# Patient Record
Sex: Female | Born: 1965 | Race: Black or African American | Hispanic: No | Marital: Married | Smoking: Current every day smoker
Health system: Southern US, Community
[De-identification: ages and names within clinical notes are randomized; demographics above are authoritative.]

## PROBLEM LIST (undated history)

## (undated) DIAGNOSIS — Z720 Tobacco use: Secondary | ICD-10-CM

## (undated) DIAGNOSIS — K920 Hematemesis: Secondary | ICD-10-CM

## (undated) DIAGNOSIS — I509 Heart failure, unspecified: Secondary | ICD-10-CM

## (undated) DIAGNOSIS — I10 Essential (primary) hypertension: Secondary | ICD-10-CM

## (undated) DIAGNOSIS — F4310 Post-traumatic stress disorder, unspecified: Secondary | ICD-10-CM

## (undated) DIAGNOSIS — E782 Mixed hyperlipidemia: Secondary | ICD-10-CM

## (undated) DIAGNOSIS — G4730 Sleep apnea, unspecified: Secondary | ICD-10-CM

## (undated) DIAGNOSIS — J849 Interstitial pulmonary disease, unspecified: Secondary | ICD-10-CM

## (undated) DIAGNOSIS — B1920 Unspecified viral hepatitis C without hepatic coma: Secondary | ICD-10-CM

## (undated) DIAGNOSIS — J449 Chronic obstructive pulmonary disease, unspecified: Secondary | ICD-10-CM

## (undated) DIAGNOSIS — D699 Hemorrhagic condition, unspecified: Secondary | ICD-10-CM

## (undated) DIAGNOSIS — Z9981 Dependence on supplemental oxygen: Secondary | ICD-10-CM

## (undated) DIAGNOSIS — I8500 Esophageal varices without bleeding: Secondary | ICD-10-CM

## (undated) DIAGNOSIS — K7460 Unspecified cirrhosis of liver: Secondary | ICD-10-CM

## (undated) DIAGNOSIS — I5189 Other ill-defined heart diseases: Secondary | ICD-10-CM

## (undated) DIAGNOSIS — B1710 Acute hepatitis C without hepatic coma: Secondary | ICD-10-CM

## (undated) DIAGNOSIS — J45909 Unspecified asthma, uncomplicated: Secondary | ICD-10-CM

## (undated) DIAGNOSIS — K922 Gastrointestinal hemorrhage, unspecified: Secondary | ICD-10-CM

## (undated) DIAGNOSIS — E119 Type 2 diabetes mellitus without complications: Secondary | ICD-10-CM

## (undated) HISTORY — PX: CHOLECYSTECTOMY: SHX55

## (undated) HISTORY — DX: Heart failure, unspecified: I50.9

---

## 2005-03-11 ENCOUNTER — Emergency Department: Payer: Self-pay | Admitting: Unknown Physician Specialty

## 2005-06-14 ENCOUNTER — Ambulatory Visit: Payer: Self-pay | Admitting: Specialist

## 2005-06-26 ENCOUNTER — Ambulatory Visit: Payer: Self-pay | Admitting: Specialist

## 2007-04-22 ENCOUNTER — Emergency Department: Payer: Self-pay | Admitting: Emergency Medicine

## 2007-04-30 ENCOUNTER — Ambulatory Visit: Payer: Self-pay | Admitting: Oncology

## 2007-05-21 ENCOUNTER — Ambulatory Visit: Payer: Self-pay | Admitting: Oncology

## 2007-05-31 ENCOUNTER — Ambulatory Visit: Payer: Self-pay | Admitting: Oncology

## 2007-06-28 ENCOUNTER — Ambulatory Visit: Payer: Self-pay | Admitting: Oncology

## 2007-07-25 ENCOUNTER — Ambulatory Visit: Payer: Self-pay | Admitting: Family Medicine

## 2007-09-24 ENCOUNTER — Ambulatory Visit: Payer: Self-pay | Admitting: Pain Medicine

## 2007-10-05 ENCOUNTER — Ambulatory Visit: Payer: Self-pay | Admitting: Pain Medicine

## 2007-11-05 ENCOUNTER — Ambulatory Visit: Payer: Self-pay | Admitting: Pain Medicine

## 2007-11-11 ENCOUNTER — Ambulatory Visit: Payer: Self-pay | Admitting: Pain Medicine

## 2007-11-30 ENCOUNTER — Ambulatory Visit: Payer: Self-pay | Admitting: Pain Medicine

## 2007-12-09 ENCOUNTER — Ambulatory Visit: Payer: Self-pay | Admitting: Pain Medicine

## 2008-01-07 ENCOUNTER — Ambulatory Visit: Payer: Self-pay | Admitting: Pain Medicine

## 2008-02-01 ENCOUNTER — Ambulatory Visit: Payer: Self-pay | Admitting: Pain Medicine

## 2008-02-18 ENCOUNTER — Ambulatory Visit: Payer: Self-pay

## 2008-04-13 ENCOUNTER — Inpatient Hospital Stay: Payer: Self-pay | Admitting: Internal Medicine

## 2008-06-07 ENCOUNTER — Ambulatory Visit: Payer: Self-pay | Admitting: Pain Medicine

## 2008-06-20 ENCOUNTER — Ambulatory Visit: Payer: Self-pay | Admitting: Pain Medicine

## 2009-08-20 ENCOUNTER — Emergency Department: Payer: Self-pay | Admitting: Emergency Medicine

## 2009-09-05 ENCOUNTER — Emergency Department: Payer: Self-pay | Admitting: Internal Medicine

## 2009-10-14 ENCOUNTER — Inpatient Hospital Stay: Payer: Self-pay | Admitting: Internal Medicine

## 2009-11-17 ENCOUNTER — Ambulatory Visit: Payer: Self-pay | Admitting: Otolaryngology

## 2009-11-29 ENCOUNTER — Ambulatory Visit: Payer: Self-pay | Admitting: Internal Medicine

## 2009-12-06 ENCOUNTER — Encounter: Payer: Self-pay | Admitting: Otolaryngology

## 2009-12-28 ENCOUNTER — Encounter: Payer: Self-pay | Admitting: Otolaryngology

## 2010-01-27 ENCOUNTER — Encounter: Payer: Self-pay | Admitting: Otolaryngology

## 2011-03-04 ENCOUNTER — Emergency Department: Payer: Self-pay | Admitting: Emergency Medicine

## 2011-06-07 ENCOUNTER — Ambulatory Visit: Payer: Self-pay | Admitting: Primary Care

## 2011-07-29 ENCOUNTER — Ambulatory Visit: Payer: Self-pay | Admitting: Family Medicine

## 2011-08-16 ENCOUNTER — Emergency Department: Payer: Self-pay | Admitting: *Deleted

## 2011-08-16 LAB — COMPREHENSIVE METABOLIC PANEL
Alkaline Phosphatase: 141 U/L — ABNORMAL HIGH (ref 50–136)
Anion Gap: 8 (ref 7–16)
Calcium, Total: 8.3 mg/dL — ABNORMAL LOW (ref 8.5–10.1)
Co2: 26 mmol/L (ref 21–32)
Creatinine: 0.94 mg/dL (ref 0.60–1.30)
Potassium: 3.7 mmol/L (ref 3.5–5.1)
SGPT (ALT): 154 U/L — ABNORMAL HIGH
Sodium: 135 mmol/L — ABNORMAL LOW (ref 136–145)

## 2011-08-16 LAB — URINALYSIS, COMPLETE
Glucose,UR: NEGATIVE mg/dL (ref 0–75)
Nitrite: NEGATIVE
Ph: 5 (ref 4.5–8.0)
RBC,UR: 2 /HPF (ref 0–5)
Specific Gravity: 1.005 (ref 1.003–1.030)
Squamous Epithelial: 8
WBC UR: 1 /HPF (ref 0–5)

## 2011-08-16 LAB — LIPASE, BLOOD: Lipase: 223 U/L (ref 73–393)

## 2011-08-16 LAB — CBC
HCT: 46 % (ref 35.0–47.0)
RBC: 4.45 10*6/uL (ref 3.80–5.20)
WBC: 4.9 10*3/uL (ref 3.6–11.0)

## 2013-01-31 LAB — URINALYSIS, COMPLETE
Bilirubin,UR: NEGATIVE
Glucose,UR: NEGATIVE mg/dL (ref 0–75)
Hyaline Cast: 2
Ketone: NEGATIVE
Nitrite: POSITIVE
Protein: NEGATIVE
Squamous Epithelial: 2
WBC UR: 10 /HPF (ref 0–5)

## 2013-01-31 LAB — COMPREHENSIVE METABOLIC PANEL
Albumin: 2.5 g/dL — ABNORMAL LOW (ref 3.4–5.0)
Anion Gap: 7 (ref 7–16)
Bilirubin,Total: 0.9 mg/dL (ref 0.2–1.0)
Creatinine: 0.54 mg/dL — ABNORMAL LOW (ref 0.60–1.30)
EGFR (African American): >60
Glucose: 124 mg/dL — ABNORMAL HIGH (ref 65–99)
Osmolality: 268 (ref 275–301)
Potassium: 4.2 mmol/L (ref 3.5–5.1)
SGOT(AST): 126 U/L — ABNORMAL HIGH (ref 15–37)
SGPT (ALT): 124 U/L — ABNORMAL HIGH (ref 12–78)

## 2013-01-31 LAB — CBC
MCH: 35.5 pg — ABNORMAL HIGH (ref 26.0–34.0)
MCHC: 34.4 g/dL (ref 32.0–36.0)
MCV: 103 fL — ABNORMAL HIGH (ref 80–100)

## 2013-01-31 LAB — LIPASE, BLOOD: Lipase: 326 U/L (ref 73–393)

## 2013-02-01 ENCOUNTER — Observation Stay: Payer: Self-pay | Admitting: Surgery

## 2013-02-01 LAB — CBC WITH DIFFERENTIAL/PLATELET
Basophil #: 0 10*3/uL (ref 0.0–0.1)
Basophil %: 0.4 %
Eosinophil #: 0 10*3/uL (ref 0.0–0.7)
Eosinophil %: 0.1 %
HGB: 14.6 g/dL (ref 12.0–16.0)
Lymphocyte #: 2.3 10*3/uL (ref 1.0–3.6)
Lymphocyte %: 19.8 %
MCH: 35.3 pg — ABNORMAL HIGH (ref 26.0–34.0)
MCV: 103 fL — ABNORMAL HIGH (ref 80–100)
Monocyte #: 0.7 x10 3/mm (ref 0.2–0.9)
Monocyte %: 5.8 %
Neutrophil %: 73.9 %
Platelet: 42 10*3/uL — ABNORMAL LOW (ref 150–440)
RBC: 4.14 10*6/uL (ref 3.80–5.20)
RDW: 13.8 % (ref 11.5–14.5)
WBC: 11.6 10*3/uL — ABNORMAL HIGH (ref 3.6–11.0)

## 2013-02-01 LAB — PROTIME-INR
INR: 1.4
Prothrombin Time: 16.8 secs — ABNORMAL HIGH (ref 11.5–14.7)

## 2013-02-01 LAB — BASIC METABOLIC PANEL
Anion Gap: 5 — ABNORMAL LOW (ref 7–16)
BUN: 9 mg/dL (ref 7–18)
Creatinine: 1.01 mg/dL (ref 0.60–1.30)
EGFR (African American): >60
EGFR (Non-African Amer.): >60
Potassium: 3.8 mmol/L (ref 3.5–5.1)
Sodium: 135 mmol/L — ABNORMAL LOW (ref 136–145)

## 2013-02-01 LAB — HEPATIC FUNCTION PANEL A (ARMC)
Bilirubin, Direct: 0.4 mg/dL — ABNORMAL HIGH (ref 0.00–0.20)
Bilirubin,Total: 1.3 mg/dL — ABNORMAL HIGH (ref 0.2–1.0)
SGOT(AST): 84 U/L — ABNORMAL HIGH (ref 15–37)
SGPT (ALT): 101 U/L — ABNORMAL HIGH (ref 12–78)
Total Protein: 7.7 g/dL (ref 6.4–8.2)

## 2013-02-02 LAB — COMPREHENSIVE METABOLIC PANEL
Alkaline Phosphatase: 86 U/L (ref 50–136)
Anion Gap: 4 — ABNORMAL LOW (ref 7–16)
BUN: 7 mg/dL (ref 7–18)
Bilirubin,Total: 1.6 mg/dL — ABNORMAL HIGH (ref 0.2–1.0)
Chloride: 102 mmol/L (ref 98–107)
Co2: 27 mmol/L (ref 21–32)
Creatinine: 1.03 mg/dL (ref 0.60–1.30)
EGFR (Non-African Amer.): >60
Glucose: 130 mg/dL — ABNORMAL HIGH (ref 65–99)
Osmolality: 266 (ref 275–301)
SGOT(AST): 55 U/L — ABNORMAL HIGH (ref 15–37)
Sodium: 133 mmol/L — ABNORMAL LOW (ref 136–145)
Total Protein: 7.9 g/dL (ref 6.4–8.2)

## 2013-02-02 LAB — CBC WITH DIFFERENTIAL/PLATELET
Eosinophil %: 0.1 %
HCT: 39.4 % (ref 35.0–47.0)
Lymphocyte %: 28.5 %
MCH: 35.6 pg — ABNORMAL HIGH (ref 26.0–34.0)
MCV: 102 fL — ABNORMAL HIGH (ref 80–100)
Neutrophil #: 6.8 10*3/uL — ABNORMAL HIGH (ref 1.4–6.5)
Neutrophil %: 59.5 %
Platelet: 35 10*3/uL — ABNORMAL LOW (ref 150–440)
RBC: 3.85 10*6/uL (ref 3.80–5.20)
RDW: 13.8 % (ref 11.5–14.5)
WBC: 11.4 10*3/uL — ABNORMAL HIGH (ref 3.6–11.0)

## 2013-04-14 DIAGNOSIS — K8020 Calculus of gallbladder without cholecystitis without obstruction: Secondary | ICD-10-CM | POA: Insufficient documentation

## 2013-04-14 DIAGNOSIS — K859 Acute pancreatitis without necrosis or infection, unspecified: Secondary | ICD-10-CM | POA: Insufficient documentation

## 2013-07-12 ENCOUNTER — Ambulatory Visit: Payer: Self-pay | Admitting: Pain Medicine

## 2013-11-13 ENCOUNTER — Emergency Department: Payer: Self-pay | Admitting: Emergency Medicine

## 2013-11-13 LAB — CBC WITH DIFFERENTIAL/PLATELET
BASOS ABS: 0.2 10*3/uL — AB (ref 0.0–0.1)
BASOS PCT: 3.3 %
EOS ABS: 0.1 10*3/uL (ref 0.0–0.7)
Eosinophil %: 1.2 %
HCT: 47.5 % — ABNORMAL HIGH (ref 35.0–47.0)
HGB: 15.3 g/dL (ref 12.0–16.0)
Lymphocyte #: 2.4 10*3/uL (ref 1.0–3.6)
Lymphocyte %: 37 %
MCH: 33.7 pg (ref 26.0–34.0)
MCHC: 32.2 g/dL (ref 32.0–36.0)
MCV: 105 fL — AB (ref 80–100)
MONO ABS: 0.5 x10 3/mm (ref 0.2–0.9)
MONOS PCT: 7.3 %
Neutrophil #: 3.4 10*3/uL (ref 1.4–6.5)
Neutrophil %: 51.2 %
PLATELETS: 68 10*3/uL — AB (ref 150–440)
RBC: 4.53 10*6/uL (ref 3.80–5.20)
RDW: 13.6 % (ref 11.5–14.5)
WBC: 6.6 10*3/uL (ref 3.6–11.0)

## 2013-11-13 LAB — COMPREHENSIVE METABOLIC PANEL
ALK PHOS: 172 U/L — AB
ANION GAP: 5 — AB (ref 7–16)
Albumin: 2.6 g/dL — ABNORMAL LOW (ref 3.4–5.0)
BILIRUBIN TOTAL: 0.8 mg/dL (ref 0.2–1.0)
BUN: 13 mg/dL (ref 7–18)
CO2: 31 mmol/L (ref 21–32)
Calcium, Total: 8.6 mg/dL (ref 8.5–10.1)
Chloride: 97 mmol/L — ABNORMAL LOW (ref 98–107)
Creatinine: 1.16 mg/dL (ref 0.60–1.30)
EGFR (African American): >60
GFR CALC NON AF AMER: 56 — AB
Glucose: 324 mg/dL — ABNORMAL HIGH (ref 65–99)
Osmolality: 279 (ref 275–301)
Potassium: 4.6 mmol/L (ref 3.5–5.1)
SGOT(AST): 92 U/L — ABNORMAL HIGH (ref 15–37)
SGPT (ALT): 120 U/L — ABNORMAL HIGH (ref 12–78)
Sodium: 133 mmol/L — ABNORMAL LOW (ref 136–145)
Total Protein: 9.3 g/dL — ABNORMAL HIGH (ref 6.4–8.2)

## 2013-11-13 LAB — PRO B NATRIURETIC PEPTIDE: B-Type Natriuretic Peptide: 30 pg/mL (ref 0–125)

## 2014-01-28 ENCOUNTER — Inpatient Hospital Stay: Payer: Self-pay | Admitting: Internal Medicine

## 2014-01-28 LAB — BASIC METABOLIC PANEL
Anion Gap: 6 — ABNORMAL LOW (ref 7–16)
BUN: 9 mg/dL (ref 7–18)
CALCIUM: 8.4 mg/dL — AB (ref 8.5–10.1)
Chloride: 98 mmol/L (ref 98–107)
Co2: 30 mmol/L (ref 21–32)
Creatinine: 0.81 mg/dL (ref 0.60–1.30)
Glucose: 262 mg/dL — ABNORMAL HIGH (ref 65–99)
Osmolality: 276 (ref 275–301)
POTASSIUM: 4.4 mmol/L (ref 3.5–5.1)
Sodium: 134 mmol/L — ABNORMAL LOW (ref 136–145)

## 2014-01-28 LAB — CBC
HCT: 47.7 % — AB (ref 35.0–47.0)
HGB: 15.5 g/dL (ref 12.0–16.0)
MCH: 34.2 pg — ABNORMAL HIGH (ref 26.0–34.0)
MCHC: 32.5 g/dL (ref 32.0–36.0)
MCV: 105 fL — AB (ref 80–100)
Platelet: 64 10*3/uL — ABNORMAL LOW (ref 150–440)
RBC: 4.54 10*6/uL (ref 3.80–5.20)
RDW: 14.3 % (ref 11.5–14.5)
WBC: 5.3 10*3/uL (ref 3.6–11.0)

## 2014-01-28 LAB — TROPONIN I: Troponin-I: <0.02 ng/mL

## 2014-01-28 LAB — PRO B NATRIURETIC PEPTIDE: B-Type Natriuretic Peptide: 44 pg/mL (ref 0–125)

## 2014-01-29 LAB — COMPREHENSIVE METABOLIC PANEL
ALK PHOS: 192 U/L — AB
ALT: 72 U/L — AB
ANION GAP: 5 — AB (ref 7–16)
AST: 41 U/L — AB (ref 15–37)
Albumin: 2.5 g/dL — ABNORMAL LOW (ref 3.4–5.0)
BUN: 14 mg/dL (ref 7–18)
Bilirubin,Total: 0.8 mg/dL (ref 0.2–1.0)
CALCIUM: 8.5 mg/dL (ref 8.5–10.1)
CREATININE: 0.82 mg/dL (ref 0.60–1.30)
Chloride: 98 mmol/L (ref 98–107)
Co2: 29 mmol/L (ref 21–32)
Glucose: 405 mg/dL — ABNORMAL HIGH (ref 65–99)
Osmolality: 282 (ref 275–301)
Potassium: 4.1 mmol/L (ref 3.5–5.1)
Sodium: 132 mmol/L — ABNORMAL LOW (ref 136–145)
Total Protein: 8.9 g/dL — ABNORMAL HIGH (ref 6.4–8.2)

## 2014-01-29 LAB — CBC WITH DIFFERENTIAL/PLATELET
Basophil #: 0 10*3/uL (ref 0.0–0.1)
Basophil %: 0.4 %
EOS ABS: 0 10*3/uL (ref 0.0–0.7)
EOS PCT: 0 %
HCT: 45.9 % (ref 35.0–47.0)
HGB: 14.7 g/dL (ref 12.0–16.0)
Lymphocyte #: 0.7 10*3/uL — ABNORMAL LOW (ref 1.0–3.6)
Lymphocyte %: 9 %
MCH: 33.6 pg (ref 26.0–34.0)
MCHC: 32.1 g/dL (ref 32.0–36.0)
MCV: 105 fL — ABNORMAL HIGH (ref 80–100)
Monocyte #: 0.2 x10 3/mm (ref 0.2–0.9)
Monocyte %: 2.3 %
Neutrophil #: 7.1 10*3/uL — ABNORMAL HIGH (ref 1.4–6.5)
Neutrophil %: 88.3 %
Platelet: 62 10*3/uL — ABNORMAL LOW (ref 150–440)
RBC: 4.38 10*6/uL (ref 3.80–5.20)
RDW: 14.2 % (ref 11.5–14.5)
WBC: 8.1 10*3/uL (ref 3.6–11.0)

## 2014-01-29 LAB — TSH: Thyroid Stimulating Horm: 0.29 u[IU]/mL — ABNORMAL LOW

## 2014-04-08 ENCOUNTER — Inpatient Hospital Stay: Payer: Self-pay | Admitting: Internal Medicine

## 2014-04-08 LAB — BASIC METABOLIC PANEL
Anion Gap: 5 — ABNORMAL LOW (ref 7–16)
BUN: 8 mg/dL (ref 7–18)
Calcium, Total: 7.9 mg/dL — ABNORMAL LOW (ref 8.5–10.1)
Chloride: 98 mmol/L (ref 98–107)
Co2: 30 mmol/L (ref 21–32)
Creatinine: 0.74 mg/dL (ref 0.60–1.30)
EGFR (Non-African Amer.): >60
Glucose: 219 mg/dL — ABNORMAL HIGH (ref 65–99)
OSMOLALITY: 271 (ref 275–301)
Potassium: 4 mmol/L (ref 3.5–5.1)
SODIUM: 133 mmol/L — AB (ref 136–145)

## 2014-04-08 LAB — PRO B NATRIURETIC PEPTIDE: B-Type Natriuretic Peptide: 55 pg/mL (ref 0–125)

## 2014-04-08 LAB — CBC
HCT: 47.7 % — AB (ref 35.0–47.0)
HGB: 15.5 g/dL (ref 12.0–16.0)
MCH: 33.8 pg (ref 26.0–34.0)
MCHC: 32.4 g/dL (ref 32.0–36.0)
MCV: 104 fL — ABNORMAL HIGH (ref 80–100)
Platelet: 88 10*3/uL — ABNORMAL LOW (ref 150–440)
RBC: 4.57 10*6/uL (ref 3.80–5.20)
RDW: 13.5 % (ref 11.5–14.5)
WBC: 11 10*3/uL (ref 3.6–11.0)

## 2014-04-08 LAB — TROPONIN I

## 2014-04-09 LAB — CBC WITH DIFFERENTIAL/PLATELET
BASOS ABS: 0 10*3/uL (ref 0.0–0.1)
Basophil %: 0.2 %
Eosinophil #: 0 10*3/uL (ref 0.0–0.7)
Eosinophil %: 0 %
HCT: 47.7 % — ABNORMAL HIGH (ref 35.0–47.0)
HGB: 15.3 g/dL (ref 12.0–16.0)
Lymphocyte #: 0.8 10*3/uL — ABNORMAL LOW (ref 1.0–3.6)
Lymphocyte %: 10.8 %
MCH: 34.3 pg — ABNORMAL HIGH (ref 26.0–34.0)
MCHC: 32.1 g/dL (ref 32.0–36.0)
MCV: 107 fL — ABNORMAL HIGH (ref 80–100)
Monocyte #: 0.5 x10 3/mm (ref 0.2–0.9)
Monocyte %: 7.2 %
Neutrophil #: 6.2 10*3/uL (ref 1.4–6.5)
Neutrophil %: 81.8 %
Platelet: 80 10*3/uL — ABNORMAL LOW (ref 150–440)
RBC: 4.47 10*6/uL (ref 3.80–5.20)
RDW: 13.7 % (ref 11.5–14.5)
WBC: 7.6 10*3/uL (ref 3.6–11.0)

## 2014-04-09 LAB — BASIC METABOLIC PANEL
Anion Gap: 6 — ABNORMAL LOW (ref 7–16)
BUN: 12 mg/dL (ref 7–18)
CO2: 26 mmol/L (ref 21–32)
Calcium, Total: 7.8 mg/dL — ABNORMAL LOW (ref 8.5–10.1)
Chloride: 100 mmol/L (ref 98–107)
Creatinine: 0.94 mg/dL (ref 0.60–1.30)
EGFR (African American): >60
EGFR (Non-African Amer.): >60
Glucose: 485 mg/dL — ABNORMAL HIGH (ref 65–99)
Osmolality: 286 (ref 275–301)
Potassium: 4.7 mmol/L (ref 3.5–5.1)
Sodium: 132 mmol/L — ABNORMAL LOW (ref 136–145)

## 2014-04-09 LAB — HEMOGLOBIN A1C: HEMOGLOBIN A1C: 7.1 % — AB (ref 4.2–6.3)

## 2014-04-12 LAB — CBC WITH DIFFERENTIAL/PLATELET
Basophil #: 0 10*3/uL (ref 0.0–0.1)
Basophil %: 0.3 %
Eosinophil #: 0 10*3/uL (ref 0.0–0.7)
Eosinophil %: 0.3 %
HCT: 45.6 % (ref 35.0–47.0)
HGB: 14.6 g/dL (ref 12.0–16.0)
Lymphocyte #: 2.7 10*3/uL (ref 1.0–3.6)
Lymphocyte %: 28.4 %
MCH: 34.3 pg — ABNORMAL HIGH (ref 26.0–34.0)
MCHC: 32 g/dL (ref 32.0–36.0)
MCV: 107 fL — ABNORMAL HIGH (ref 80–100)
Monocyte #: 1.1 x10 3/mm — ABNORMAL HIGH (ref 0.2–0.9)
Monocyte %: 11.2 %
Neutrophil #: 5.7 10*3/uL (ref 1.4–6.5)
Neutrophil %: 59.8 %
Platelet: 89 10*3/uL — ABNORMAL LOW (ref 150–440)
RBC: 4.24 10*6/uL (ref 3.80–5.20)
RDW: 13.4 % (ref 11.5–14.5)
WBC: 9.6 10*3/uL (ref 3.6–11.0)

## 2014-04-12 LAB — BASIC METABOLIC PANEL
ANION GAP: 7 (ref 7–16)
BUN: 16 mg/dL (ref 7–18)
CO2: 29 mmol/L (ref 21–32)
Calcium, Total: 8.3 mg/dL — ABNORMAL LOW (ref 8.5–10.1)
Chloride: 99 mmol/L (ref 98–107)
Creatinine: 0.94 mg/dL (ref 0.60–1.30)
EGFR (African American): >60
EGFR (Non-African Amer.): >60
Glucose: 486 mg/dL — ABNORMAL HIGH (ref 65–99)
Osmolality: 293 (ref 275–301)
Potassium: 4.1 mmol/L (ref 3.5–5.1)
Sodium: 135 mmol/L — ABNORMAL LOW (ref 136–145)

## 2014-04-12 LAB — EXPECTORATED SPUTUM ASSESSMENT W GRAM STAIN, RFLX TO RESP C

## 2014-04-13 LAB — PROTIME-INR
INR: 1.1
Prothrombin Time: 14.1 secs (ref 11.5–14.7)

## 2014-04-13 LAB — BASIC METABOLIC PANEL
ANION GAP: 7 (ref 7–16)
BUN: 14 mg/dL (ref 7–18)
CREATININE: 0.78 mg/dL (ref 0.60–1.30)
Calcium, Total: 8 mg/dL — ABNORMAL LOW (ref 8.5–10.1)
Chloride: 98 mmol/L (ref 98–107)
Co2: 29 mmol/L (ref 21–32)
EGFR (African American): >60
GLUCOSE: 365 mg/dL — AB (ref 65–99)
Osmolality: 284 (ref 275–301)
POTASSIUM: 3.9 mmol/L (ref 3.5–5.1)
Sodium: 134 mmol/L — ABNORMAL LOW (ref 136–145)

## 2014-04-13 LAB — CBC WITH DIFFERENTIAL/PLATELET
BASOS ABS: 0.1 10*3/uL (ref 0.0–0.1)
BASOS PCT: 0.8 %
Eosinophil #: 0.1 10*3/uL (ref 0.0–0.7)
Eosinophil %: 0.9 %
HCT: 44.9 % (ref 35.0–47.0)
HGB: 14.6 g/dL (ref 12.0–16.0)
Lymphocyte #: 2.9 10*3/uL (ref 1.0–3.6)
Lymphocyte %: 29.4 %
MCH: 34.3 pg — ABNORMAL HIGH (ref 26.0–34.0)
MCHC: 32.6 g/dL (ref 32.0–36.0)
MCV: 105 fL — AB (ref 80–100)
MONO ABS: 1 x10 3/mm — AB (ref 0.2–0.9)
MONOS PCT: 10.5 %
NEUTROS PCT: 58.4 %
Neutrophil #: 5.8 10*3/uL (ref 1.4–6.5)
Platelet: 87 10*3/uL — ABNORMAL LOW (ref 150–440)
RBC: 4.26 10*6/uL (ref 3.80–5.20)
RDW: 13.1 % (ref 11.5–14.5)
WBC: 9.9 10*3/uL (ref 3.6–11.0)

## 2014-04-13 LAB — BODY FLUID CELL COUNT WITH DIFFERENTIAL
Basophil: 0 %
EOS PCT: 0 %
Lymphocytes: 54 %
NUCLEATED CELL COUNT: 1805 /mm3
Neutrophils: 42 %
OTHER CELLS BF: 0 %
OTHER MONONUCLEAR CELLS: 4 %

## 2014-04-13 LAB — CULTURE, BLOOD (SINGLE)

## 2014-04-13 LAB — PROTEIN, BODY FLUID: PROTEIN, BODY FLUID: 4.1 g/dL

## 2014-04-13 LAB — APTT: Activated PTT: 30.2 secs (ref 23.6–35.9)

## 2014-04-13 LAB — LACTATE DEHYDROGENASE, PLEURAL OR PERITONEAL FLUID: LDH, Body Fluid: 400 U/L

## 2014-04-13 LAB — ALBUMIN, FLUID (OTHER): Body Fluid Albumin: 1.3 g/dL

## 2014-04-13 LAB — GLUCOSE, SEROUS FLUID: Glucose, Body Fluid: 414 mg/dL

## 2014-04-14 LAB — BASIC METABOLIC PANEL
Anion Gap: 6 — ABNORMAL LOW (ref 7–16)
BUN: 13 mg/dL (ref 7–18)
CALCIUM: 8 mg/dL — AB (ref 8.5–10.1)
CREATININE: 0.72 mg/dL (ref 0.60–1.30)
Chloride: 98 mmol/L (ref 98–107)
Co2: 29 mmol/L (ref 21–32)
EGFR (Non-African Amer.): >60
Glucose: 347 mg/dL — ABNORMAL HIGH (ref 65–99)
OSMOLALITY: 280 (ref 275–301)
Potassium: 3.7 mmol/L (ref 3.5–5.1)
SODIUM: 133 mmol/L — AB (ref 136–145)

## 2014-04-14 LAB — CBC WITH DIFFERENTIAL/PLATELET
Basophil #: 0 10*3/uL (ref 0.0–0.1)
Basophil %: 0.4 %
EOS ABS: 0.1 10*3/uL (ref 0.0–0.7)
Eosinophil %: 1.2 %
HCT: 45.1 % (ref 35.0–47.0)
HGB: 14.5 g/dL (ref 12.0–16.0)
Lymphocyte #: 2.6 10*3/uL (ref 1.0–3.6)
Lymphocyte %: 26.1 %
MCH: 34.3 pg — ABNORMAL HIGH (ref 26.0–34.0)
MCHC: 32.2 g/dL (ref 32.0–36.0)
MCV: 106 fL — AB (ref 80–100)
Monocyte #: 0.9 x10 3/mm (ref 0.2–0.9)
Monocyte %: 9.1 %
Neutrophil #: 6.4 10*3/uL (ref 1.4–6.5)
Neutrophil %: 63.2 %
PLATELETS: 92 10*3/uL — AB (ref 150–440)
RBC: 4.24 10*6/uL (ref 3.80–5.20)
RDW: 13.1 % (ref 11.5–14.5)
WBC: 10.1 10*3/uL (ref 3.6–11.0)

## 2014-04-17 LAB — BODY FLUID CULTURE

## 2014-05-06 ENCOUNTER — Ambulatory Visit: Payer: Self-pay | Admitting: Internal Medicine

## 2014-06-01 ENCOUNTER — Ambulatory Visit: Payer: Self-pay | Admitting: Internal Medicine

## 2014-08-19 NOTE — Consult Note (Signed)
Chief Complaint:  Subjective/Chief Complaint Pt reports continued mild upper abdominal pain.  Denies nausea or vomiting.   VITAL SIGNS/ANCILLARY NOTES: **Vital Signs.:   07-Oct-14 09:57  Vital Signs Type Q 4hr  Temperature Temperature (F) 99.9  Celsius 37.7  Temperature Source oral  Pulse Pulse 116  Respirations Respirations 18  Systolic BP Systolic BP 588  Diastolic BP (mmHg) Diastolic BP (mmHg) 69  Mean BP 81  Pulse Ox % Pulse Ox % 92  Pulse Ox Activity Level  At rest  Oxygen Delivery 2L   Brief Assessment:  GEN well developed, well nourished, A/Ox3.   Cardiac Regular   Respiratory normal resp effort   Gastrointestinal Normal   Gastrointestinal details normal Soft  Nondistended   EXTR negative edema   Additional Physical Exam Skin: warm ,dry   Lab Results:  Hepatic:  07-Oct-14 13:11   Bilirubin, Total  1.6  Alkaline Phosphatase 86  SGPT (ALT) 73  SGOT (AST)  55  Total Protein, Serum 7.9  Albumin, Serum  2.1  Routine Chem:  07-Oct-14 13:11   Glucose, Serum  130  Creatinine (comp) 1.03  Sodium, Serum  133  Potassium, Serum  3.1  Chloride, Serum 102  CO2, Serum 27  Calcium (Total), Serum  7.5  Osmolality (calc) 266  eGFR (African American) >60  eGFR (Non-African American) >60 (eGFR values <47m/min/1.73 m2 may be an indication of chronic kidney disease (CKD). Calculated eGFR is useful in patients with stable renal function. The eGFR calculation will not be reliable in acutely ill patients when serum creatinine is changing rapidly. It is not useful in  patients on dialysis. The eGFR calculation may not be applicable to patients at the low and high extremes of body sizes, pregnant women, and vegetarians.)  Anion Gap  4  Routine Hem:  07-Oct-14 13:11   WBC (CBC)  11.4  RBC (CBC) 3.85  Hemoglobin (CBC) 13.7  Hematocrit (CBC) 39.4  Platelet Count (CBC)  35  MCV  102  MCH  35.6  MCHC 34.8  RDW 13.8  Neutrophil % 59.5  Lymphocyte % 28.5  Monocyte  % 11.2  Eosinophil % 0.1  Basophil % 0.7  Neutrophil #  6.8  Lymphocyte # 3.3  Monocyte #  1.3  Eosinophil # 0.0  Basophil # 0.1 (Result(s) reported on 02 Feb 2013 at 01:27PM.)   Assessment/Plan:  Assessment/Plan:  Assessment ETOH/HCV cirrhosis:  Hx nonwith followup & daily ETOH.  MELD 11. Acute calculus cholecystitis:  WBC 11.6.  Pt high-risk surgical candidate given thrombocytopenia.   Plan 1) Continue IV Zosyn 2) Agree w/ suppportive measures including antiemetics, fluids, PPI & pain control 3) ETOH cessation  4) Outpatient EGD to look for varices & screening colonscopy at a later date Pt was seen by Dr DLucilla Lame& our plan of care is outlined above. Please call with any questions or concerns.   Electronic Signatures: JAndria Meuse(NP)  (Signed 07-Oct-14 14:00)  Authored: Chief Complaint, VITAL SIGNS/ANCILLARY NOTES, Brief Assessment, Lab Results, Assessment/Plan   Last Updated: 07-Oct-14 14:00 by JAndria Meuse(NP)

## 2014-08-19 NOTE — Consult Note (Signed)
Brief Consult Note: Diagnosis: Abd pain/cirrhosis.   Patient was seen by consultant.   Consult note dictated.   Discussed with Attending MD.   Comments: [name] is a [age] y/o black female with ETOH/HCV cirrhosis [facility] who was not candidate for treatment admitted with less than 24 hrs of abdominal pain, upper that radiates to entire abdomen.  MELD 11.  Ultrasound suggests cholelithiasis/sludge & percholecystic fluid with a normal CBD.  These findings can be non-specific in cirrhotics, so her symptoms could be due to acute cholecystitis or other acute viral gastroenteritis or illness.  Unfortunately due to her liver disease & thrombocytopenia (platelets 42), she is a high risk surgical candidate.  Plan: 1) Agree with IV Zosyn 2) Agree w/ suppportive measures including antiemetics, fluids, PPI & pain control 3) ETOH cessation discussion 4) Needs outpatient EGD to look for varices & screening colonscopy at a later date 5) We will follow with you  Thanks for consult.  Please see full dictated note [phone]#[id number].  Electronic Signatures: Joselyn ArrowJones, Sencere Symonette L (NP)  (Signed 06-Oct-14 14:39)  Authored: Brief Consult Note   Last Updated: 06-Oct-14 14:39 by Joselyn ArrowJones, Soha Thorup L (NP)

## 2014-08-19 NOTE — Discharge Summary (Signed)
PATIENT NAME:  [name], [name] MR#:  [id number] DATE OF BIRTH:  [birth year]  BRIEF HISTORY: [name] is [name] 49 year old woman seen in the Emergency Room with generalized abdominal pain and right upper quadrant and epigastric area with no nausea or vomiting. Laboratory values revealed normal white blood cell count, normal hemoglobin, normal liver function studies with the exception of slightly elevated transaminases. Ultrasound demonstrated some possible pericholecystic fluid, some possible free abdominal fluid, tiny gallstones, and possible abdominal wall thickening. The study was read as possible acute cholecystitis. However, the patient has [name] long-standing history of alcoholic cirrhosis and hepatitis C, has [facility].   We initially felt that her symptoms were most likely due to her long-standing cirrhosis then to biliary tract disease. She had [name] platelet count 67,000. She was seen by the GI service and internal medicine service in consultation. Both did not feel that the patient had significant biliary tract disease and her symptoms were managed with pain medication.   She is discharged home on the 8th to be [place] up [place] [facility].   DISCHARGE MEDICATIONS: Include metformin 500 mg b.i.d., Advair Diskus 250/50 one  puff b.i.d., albuterol inhaler 1 puff 4 times daily and Keflex 500 mg p.o. 4 times daily.   FINAL DISCHARGE DIAGNOSIS: Liver cirrhosis. No surgery was performed.   ____________________________ Carmie Endalph L. Ely III, MD rle:np D: 02/23/2013 20:36:23 ET T: 02/23/2013 20:53:21 ET JOB#: [id number]  cc: Carmie Endalph L. Ely III, MD, <Dictator> Midge Miniumarren Wohl, MD Juluis RainierElizabeth Barnes, MD Quentin OreALPH L ELY MD ELECTRONICALLY SIGNED 02/24/2013 21:31

## 2014-08-19 NOTE — Consult Note (Signed)
PATIENT NAME:  Brittany [name], Brittany [name] MR#:  [id number] DATE OF BIRTH:  08/01/1965  DATE OF CONSULTATION:  02/01/2013  REFERRING PHYSICIAN:  Carmie Endalph L. Ely III, MD CONSULTING PHYSICIAN:  Brittany ArrowKandice L. Mirna Sutcliffe, NP  PRIMARY CARE PHYSICIAN:  [name].   GASTROENTEROLOGIST:  [facility].   REASON FOR CONSULTATION: Cirrhosis, abdominal pain.   HISTORY OF PRESENT ILLNESS: Ms. Brittany [name] is [name] 49 year old black female with history of chronic hepatitis and alcoholic cirrhosis, [facility]. She tells me yesterday she began to have severe abdominal pain that she rates 10 out of 10 on the pain scale. She describes the pain as grabbing. She was sitting on her couch doing laundry  yesterday afternoon when the pain began. The pain is mostly epigastric but it does radiate to her umbilicus and the right upper quadrant. She was [facility] years ago and apparently was not [name] candidate for treatment so she did not follow back up with them. She continues to drink about 2 beers daily. She has noticed some increased abdominal girth and weight gain of about 11 pounds in the last couple weeks. She has had some shortness of breath on exertion. She does have occasional constipation but sometimes this alternates with loose stools as well. She denies any nausea, vomiting, indigestion or heartburn. She has been passing flatus. She denies any dysphagia or odynophagia. She denies any recent jaundice or pruritus except for [name] left forearm rash. She believes she contracted hepatitis from [name] cat 2 years ago. She denies any new medications. Ultrasound showed stone and sludge in the gallbladder, thickened gallbladder wall, pericholecystic fluid and the common bile duct is 3.9 mm. She was seen by Dr. Michela PitcherEly and due to increased surgical risk she is not [name] candidate for cholecystectomy at this time. Platelet count is 42. Her white blood cell count was 11.6 with an ANC of 8600. She was started on IV Zosyn as  well as alcohol withdrawal protocol and prophylactic b.i.d. PPI. Her pain is 5 out of 10 now. She has not had any vomiting since admission.   PAST MEDICAL AND SURGICAL HISTORY: 1.  Alcoholic/HCV cirrhosis, untreated, [facility].  2.  PTSD.  3.  Psoriasis.  4.  Polycythemia.  5.  Depression. 6.  Asthma. 7.  Hypertension.  8.  Diabetes mellitus.  9.  Obesity.  10.  Uterine fibroids, removed.  11.  Sleep apnea.  12.  Chronic back and shoulder pain, followed by pain clinic.   MEDICATIONS PRIOR TO ADMISSION: Advair Diskus 250/50 mcg 1 puff b.i.d., albuterol 100/20 mcg 1 puff q.i.d. p.r.n., metformin extended-release 500 mg b.i.d.   ALLERGIES: SHE HAS INTOLERANCE TO GEL CAPS WHICH CAUSE NAUSEA, VOMITING AND DIARRHEA.   FAMILY HISTORY: Her father deceased at with history of alcoholic cirrhosis. Mother deceased secondary to congestive heart failure. She has had multiple second-degree relatives with colon cancer. She has never had [name] colonoscopy.   SOCIAL HISTORY: She has [name] 30 pack-year history of tobacco use. She has [name] history of crack use but quit 10 years ago. She is disabled. She is an alcoholic. She has been married for 17 years. She has 281 healthy 49 year old daughter.   REVIEW OF SYSTEMS: HEENT: She has [name] chronic sinus condition for which she uses saline nose spray.  CONSTITUTIONAL: She has had some malaise and fatigue. Otherwise, negative complete 12-point review of systems.   PHYSICAL EXAMINATION: VITAL SIGNS: Temperature 99.1, pulse 108, respirations  20, blood pressure 103/68, O2 sat 94% on 2 L/min.  GENERAL: She is an obese black female who is alert, oriented, pleasant and cooperative. No acute distress.  HEENT: Sclerae clear, anicteric, conjunctivae pink. Oropharynx pink and moist without any lesions.  NECK: Supple without mass or thyromegaly.  CHEST: Heart regular rate and rhythm. Normal S1, S2. No murmurs, clicks, rubs or gallops.  LUNGS: Clear to auscultation  bilaterally.  ABDOMEN: Protuberant with positive bowel sounds x 4. No bruits auscultated. Abdomen is soft, moderately distended. She does have mild tenderness to the epigastrium and right upper quadrant on deep palpation. There is no rebound, tenderness or guarding. No hepatosplenomegaly or mass, although exam is limited given patient's body habitus.  EXTREMITIES: She has [name] left forearm hyperpigmented rash.  MUSCULOSKELETAL: Good equal strength and movement bilaterally.  NEUROLOGIC: Grossly intact.  SKIN: Warm and dry with rash as described in extremities.  RECTAL: Deferred.   LABORATORY STUDIES: Glucose 144, sodium 135, calcium 7.8, otherwise normal BMP. Lipase 326, albumin 2.1, total bilirubin 1.4, direct 0.4, alkaline phosphatase 76, AST 84, ALT 101. White blood cell count 11.6, platelets 42, hemoglobin and hematocrit normal. INR 1.4. Urinalysis shows trace LE, 2+ blood, white blood cells and bacteria. Urine pregnancy was negative.   IMPRESSION: Ms. Brittany [name] is [name] pleasant 49 year old black female with alcoholic/hepatitis C cirrhosis previously followed by [facility] liver clinic, who is not [name] candidate for treatment, admitted with less than 24 hours of abdominal pain, mostly upper abdominal pain that radiates to her entire abdomen. Her Model for End-Stage Liver Disease is 11. Her ultrasound suggested cholelithiasis/sludge and pericholecystic fluid with [name] normal common bile duct. These findings can be nonspecific and cirrhotic so her symptoms could be due to acute cholecystitis or other acute viral gastroenteritis or illness. Unfortunately, due to her liver disease and thrombocytopenia with [name] platelet count of 42, she is [name] very high-risk surgical candidate.   PLAN: 1.  Agree with IV Zosyn.  2.  Agree with supportive measures including antiemetics, fluids, PPI and pain control.  3.  EtOH cessation discussion.  4.  Needs outpatient EGD to look for varices and screening colonoscopy at [name] later date.  5.  We  will follow with you.   Thanks or allowing [place] to participate in the care of Ms. Cinco.   ____________________________ Brittany Arrow, NP klj:cs D: 02/01/2013 14:47:14 ET T: 02/01/2013 15:00:31 ET JOB#: [id number]  cc: Brittany Arrow, NP, <Dictator> [facility] Brittany Arrow FNP ELECTRONICALLY SIGNED 02/17/2013 9:54

## 2014-08-19 NOTE — H&P (Signed)
PATIENT NAME:  Brittany [name], Brittany [name] MR#:  [id number] DATE OF BIRTH:  16-Dec-1965  DATE OF ADMISSION:  02/01/2013  [facility]  [facility] [place] [facility]   ADMITTING PHYSICIAN: Unknown  CHIEF COMPLAINT: Abdominal pain.   BRIEF HISTORY: The patient is [name] 49 year old woman, seen in the Emergency Room with sudden onset of abdominal pain this afternoon. The pain was primarily midepigastric, periumbilical, right upper quadrant, but she did have generalized abdominal pain in addition.  She was not nauseated and but anorexic. She did not vomit. She presented to the Emergency Room with persistent discomfort for further evaluation, initially requiring significant amounts of pain medicine for control of her symptoms. She was afebrile and mildly tachycardic. Laboratory values in the Emergency Room demonstrated slightly depressed sodium at 134, albumin of 2.5, slightly elevated transaminases, normal bilirubin of 0.9. Lipase was 326. White blood cell count was 6800. Hemoglobin was 16.1, platelet count was 67,000. Ultrasound was performed which demonstrated some possible pericholecystic fluid, some free abdominal fluid, tiny gallstones, some possible gallbladder wall thickening, and some possible sludge, The reading was consistent with acute cholecystitis, and the surgical service was consulted for acute cholecystitis.   The patient has [name] significant GI history with [name] long-standing history of alcoholic cirrhosis, hepatitis C. [facility] [place], with gastroenterology service, for possible liver failure. She has not been seen there in over [name] year. She has profound  type 2 diabetes, currently on metformin therapy. She was admitted to the hospital with alcoholic intoxication several years ago, and continues to have some drinking history, although she denies any drinking problem. She has no cardiac disease or thyroid  problems. She has not had any previous abdominal surgery. She denies any history of pancreatitis, diverticulitis or peptic ulcer disease. She denies any previous diagnosis of gallbladder disease.   FAMILY HISTORY: Noncontributory.   In addition to alcohol use, she does smoke cigarettes, [name] pack of cigarettes [name] day.   Otherwise, her review of systems was unremarkable.    PHYSICAL EXAMINATION: GENERAL:  She was sleeping when I entered the room, was easily awakened and appropriate. VITALS: Blood pressure is 128/68, heart rate is 110 and regular. Pain scale was [name] 10 on admission, is now [name] 2.  HEENT: No scleral icterus. No pupillary abnormalities. No facial deformities.  NECK: Supple, nontender with [name] midline trachea. No adenopathy.  CHEST: Clear with no adventitious sounds.  CARDIAC: No murmurs or gallops to my ear. Seems to be in normal sinus rhythm.  ABDOMEN: Mildly distended, generally soft with some generalized tenderness. No rebound, no guarding. She does have some point tenderness, right upper quadrant. I believe I can feel her liver edge.  EXTREMITIES: Exam reveals some mild lower extremity edema on the right side, but [name] full  range of motion, no deformities.  PSYCHIATRIC: Normal orientation, normal affect.   IMPRESSION: I seriously doubt this patient has acute cholecystitis. With her platelet count of 60,000, which is consistent with [name] 55,000 platelet count done about 3 years ago, her known  liver disease, I suspect she has exacerbation of her cirrhosis with increase in her abdominal pain and ascites.   PLAN:  We will plan to admit her to the hospital for pain control, GI evaluation.  I have asked internal medicine to assist us with management of her diabetes and hypertension. I do not see any surgical indications. She is [name] borderline class B patient,  with her ascites and depressed albumin. Abdominal surgery mortality approaches 25% to 30% in this situation. We would attempt to avoid surgery  if at all possible. This plan has been discussed with the patient in detail, and at the present time she is in agreement.     ____________________________ Carmie End, MD rle:cg D: 01/31/2013 23:36:10 ET T: 02/01/2013 05:09:57 ET JOB#: [id number]  cc: Quentin Ore III, MD, <Dictator>  Quentin Ore MD ELECTRONICALLY SIGNED 02/13/2013 17:45

## 2014-08-19 NOTE — Consult Note (Signed)
PATIENT NAME:  [name], [name] MR#:  [id number] DATE OF BIRTH:  [birth year]  DATE OF CONSULTATION:  02/01/2013  REFERRING PHYSICIAN:  Dr. Pat Patrick.   CONSULTING PHYSICIAN:  Nazaiah Navarrete R. Cieanna Stormes, MD  PRIMARY CARE PHYSICIAN: Dr. Drema Dallas.   REASON FOR CONSULTATION: Diabetes, hypertension.   HISTORY OF PRESENT ILLNESS: A 49 year old African American female patient with history of hep C, alcohol abuse, cirrhosis, diabetes, hypertension. Presents to the Emergency Room complaining of abdominal pain. The patient's ultrasound of the abdomen has shown pericholecystic fluid with gallstones, and the patient is being admitted for possible cholecystitis on the surgical service. The patient presently feels her pain is a little better after pain medication. She mentions her pain is diffuse, all over the abdomen, more so in the right upper quadrant area, with no aggravating or relieving factors.   She mentions her blood sugars are well controlled on metformin, and blood pressure has been in the normal range, although she does not remember any numbers.   She continues to drink a 6-pack of beer every day.   PAST MEDICAL HISTORY:  1. Cirrhosis.  2. Type 2 diabetes mellitus.  3. Depression.  4. Posttraumatic stress disorder.  5. Morbid obesity.  6. Hepatitis C.  7. Dermatitis.  8. Chronic back pain.  9. Asthma.  10. Alcohol abuse.  11. Tobacco abuse.   FAMILY HISTORY: Obesity, asthma and heart disease.   SOCIAL HISTORY: The patient smokes a pack a day. Drinks a 6-pack of beer every day. She is on disability. Lives with her daughters.   CODE STATUS: FULL CODE.   ALLERGIES: No known drug allergies.   MEDICATIONS: Include:  1. Advair Diskus 250/50 one puff inhaled 2 times a day.  2. Combivent Respimat 1 puff inhaled 4 times a day.  3. Metformin 500 mg oral 2 times a day.   REVIEW OF SYSTEMS:  CONSTITUTIONAL: Complains of fatigue. No fever, weight loss.  EYES: No blurred vision, pain, redness.  ENT: No  tinnitus, ear pain, hearing loss.  RESPIRATORY: No cough, wheeze, hemoptysis.  CARDIOVASCULAR: No chest pain, orthopnea. Does have chronic edema.  GASTROINTESTINAL: Has some nausea. No vomiting, diarrhea. Has abdominal pain.  GENITOURINARY: No dysuria, hematuria, frequency.  ENDOCRINE: No polyuria, nocturia, thyroid problems.  HEMATOLOGIC AND LYMPHATIC: No anemia, easy bruising, bleeding.  INTEGUMENTARY: No acne, rash, lesions.  MUSCULOSKELETAL: Has some arthritis.  NEUROLOGIC: No focal numbness, weakness, seizures.  PSYCHIATRIC: Has depression.   PHYSICAL EXAMINATION:  VITAL SIGNS: Shows temperature 97.8, pulse of 108, respirations 18, blood pressure 128/65, saturating 94% on room air.  GENERAL: Obese African American female patient lying in bed, seems comfortable, conversational, cooperative with exam.  PSYCHIATRIC: Alert and oriented x 3. Mood and affect appropriate. Judgment intact.  HEENT: Atraumatic, normocephalic. Oral mucosa dry and pink. No oral ulcers or thrush. External ears and nose normal. No pallor or icterus. Pupils bilaterally equal and reactive to light.  NECK: Supple. No thyromegaly or palpable lymph nodes. Trachea midline. No carotid bruit or JVD.  CARDIOVASCULAR: S1, S2, without any murmurs. Peripheral pulses 2+.  RESPIRATORY: Normal work of breathing. Clear to auscultation on both sides.  GASTROINTESTINAL: Soft abdomen. Tenderness diffusely, more so in the right upper quadrant with Murphy's positive. Bowel sounds present.  GENITOURINARY: No CVA tenderness or bladder distention.  SKIN: Warm and dry. No petechiae, rash, ulcers.  MUSCULOSKELETAL: No joint swelling, redness, effusion of the large joints.  NEUROLOGICAL: Motor strength 5/5 in upper and lower extremities.  LYMPHATIC: No cervical, supraclavicular lymphadenopathy.  LABORATORY STUDIES: Show glucose 124, BUN 9, creatinine 0.54, sodium 134, potassium 4.2, chloride 103. Lipase of 326.   AST and ALT of 126 and  124 with alk phos of 93, bilirubin 0.9, albumin 2.5. WBC 6.8, hemoglobin 16.1, with platelets of 67.   Urinalysis shows 3+ bacteria and 10 WBCs.   Abdominal ultrasound showed distended gallbladder with sludge and tiny gallstones. Focal thickening of the gallbladder wall up to 1 cm. Trace amount of pericholecystic fluid consistent with cholecystitis. CBD normal. Some ascites.   ASSESSMENT AND PLAN:  1. Acute cholecystitis per ultrasound. Discussed with Dr. Pat Patrick of surgery who will be admitting the patient. He does not feel like the patient has cholecystitis. The findings are likely secondary to ascites and liver disease. Presently, the patient will be observed in the hospital and have her follow up at [facility], unless there is acute worsening and she might need surgery. If she does need surgery, the patient would likely be at high risk secondary to her liver disease.  2. Alcohol abuse: The patient will be on CIWA protocol.  3. Chronic thrombocytopenia secondary to cirrhosis: Seems stable. No bleeding. No petechia.  4. Diabetes mellitus, type 2: The patient is on metformin at home. This will be continued. Will place the patient on sliding scale insulin.  5. Hypertension: She does have diagnosis of hypertension, but she is not on any antihypertensive medications. Blood pressure seems to be well controlled. Will monitor vitals.  6. Tobacco abuse: I have counseled the patient to quit smoking for greater than 3 minutes. She mentions that she is trying to quit at this time but says that it is easier said that done.  7. Urinary tract infection: Will await urine cultures. Start on intravenous antibiotics.  8. Deep venous thrombosis prophylaxis with sequential compression devices. No heparin products secondary to thrombocytopenia.   CODE STATUS: FULL CODE.   Thank you for the consult.   TIME SPENT TODAY ON THIS CONSULT: 45 minutes.    ____________________________ Leia Alf Mae Denunzio,  MD srs:gb D: 02/01/2013 00:29:22 ET T: 02/01/2013 03:50:16 ET JOB#: [id number]  cc: Alveta Heimlich R. Yaritsa Savarino, MD, <Dictator> Dr. Judithe Modest III, MD [place] MD ELECTRONICALLY SIGNED 02/21/2013 22:40

## 2014-08-19 NOTE — Consult Note (Signed)
Chief Complaint:  Subjective/Chief Complaint Pt feels well.  Denies abdominal pain, nuasea, vomiting or diarrhea.  Tolerating clear liquids well.   VITAL SIGNS/ANCILLARY NOTES: **Vital Signs.:   08-Oct-14 05:06  Temperature Temperature (F) 97.7  Celsius 36.5  Temperature Source oral  Pulse Pulse 91  Respirations Respirations 18  Systolic BP Systolic BP 104  Diastolic BP (mmHg) Diastolic BP (mmHg) 72  Mean BP 82  Pulse Ox % Pulse Ox % 93  Pulse Ox Activity Level  At rest  Oxygen Delivery 2L   Brief Assessment:  GEN well developed, well nourished, A/Ox3.   Cardiac Regular   Respiratory normal resp effort   Gastrointestinal Normal   Gastrointestinal details normal Soft  Nontender  Nondistended  Bowel sounds normal  No rebound tenderness  No gaurding   EXTR negative edema   Additional Physical Exam Skin: warm ,dry   Radiology Results: XRay:    07-Oct-14 15:50, Chest Portable Single View  Chest Portable Single View   REASON FOR EXAM:    cough,  COMMENTS:       PROCEDURE: DXR - DXR PORTABLE CHEST SINGLE VIEW  - Feb 02 2013  3:50PM     RESULT: Comparison is made to the study of 08/16/2011. There is   hypoinflation with lung base atelectasis versus infiltrate bilaterally.   There is peribronchial thickening. There is no large effusion evident on   this single projection.    IMPRESSION:  Hypoinflation with bilateral lung base atelectasis versus   developing pneumonia. Followup PA and lateral images are recommended.    Dictation Site: 2    Verified By: Elveria RoyalsGEOFFREY H. BROWNE, M.D., MD  CT:    07-Oct-14 16:22, CT Abdomen and Pelvis With Contrast  CT Abdomen and Pelvis With Contrast   REASON FOR EXAM:    (1) fevers abd pain, known cirrhosis; (2) same  COMMENTS:       PROCEDURE: CT  - CT ABDOMEN / PELVIS  W  - Feb 02 2013  4:22PM     RESULT: History: Cirrhosis. Fever. Abdominal pain.    Technique: Contrast enhanced multidetector CTobtained. Evaluation 3   dimensions on  separate workstation performed.    Contrast: 100 cc of Isovue-300.    Comparison Study: No prior.    Findings: Liver is slightly irregular. Splenomegaly is present. These     findings are consistent with patient's known history of cirrhosis with   associated portal hypertension. Small varices are noted about the stomach   and spleen. The portal vein and splenic vein are patent. The gallbladder   is nondistended. Small amount of pericholecystic fluid is present. This   may be from mild ascites. Mild ascites noted elsewhere. Possibility of   cholecystitis presenting in this fashion cannot be entirely excluded   however the gallbladder wall thickness is normal. Tiny gallstones are   present. No biliary distention. Pancreas is normal.    Adrenals normal. Kidneys normal. No hydronephrosis. Calcification is   noted in the distribution of the proximal right ureter. This may be just   lateral to the ureter and may represent phlebolith as there is no   hydronephrosis. Nonobstructing ureteral stone cannot be entirely   excluded. Similar finding at the right ureteropelvic junction ,most   likely a phlebolith. The bladder is nondistended. Uterus and adnexa are     unremarkable.     Shotty inguinal lymph nodes are noted. Shotty retroperitoneal lymph   nodes are noted. Aorta widely patent. Visceral vessels including renal  arteries are patent.    Appendix is normal. Wall thickening is noted of the rectosigmoid and left   colon. Colitis cannot be excluded.Stool versus wall thickening of the   cecum. Small bowel is slightly dilated. This may be from adynamic ileus   or a diarrheal illness such as enteritis. No gastric distention noted.   Distal esophagus is unremarkable. No free air noted. Small varices are   noted in the anterior abdomen.    Heart size normal. Atelectasis versus infiltrates lung bases. Small   umbilical hernia with herniation of fat only. Mild anasarca. No acute     bony  abnormality. Degenerative changes lumbar spine both hips.    IMPRESSION:    1. Cannot exclude changes of colitis. There is also mild small bowel   distention. Enteritis cannot be excluded. Adynamic ileus cannot be   excluded.  2. Cirrhosis with portal hypertension.  3. Mild ascites. Mild anasarca.  4. Gallstones; the gallbladder wall thickness is normal. Gallbladder is   nondistended. Small amount of fluid noted about the gallbladder. Although   this could be from cholecystitis this is most likely from patient's   ascites/hypoproteinemia.  4. Atelectasis versus pneumonia lung bases.      Verified By: Gwynn Burly, M.D., MD   Assessment/Plan:  Assessment/Plan:  Assessment ETOH/HCV cirrhosis:  Hx noncompliance with followup & daily ETOH.  MELD 11. ? Acute calculus cholecystitis:  Reviewed CT with pt.  ? gastroenteritis vs. cholecystitis.  No hx of diarrhea therefore findings of colitis on CT likely secondary to cirrhosis, ascites & portal HTN.  Pt is clinically improved.  Pt high-risk surgical candidate given thrombocytopenia on empiric Zosyn.   Plan 1) Continue suppportive measures including antiemetics, fluids, PPI & pain control 2) ETOH cessation  3) Hopeful DC home soon 4) Outpatient EGD to look for varices & screening colonscopy at a later date Please call with any questions or concerns.   Electronic Signatures: Joselyn Arrow (NP)  (Signed 08-Oct-14 09:20)  Authored: Chief Complaint, VITAL SIGNS/ANCILLARY NOTES, Brief Assessment, Radiology Results, Assessment/Plan   Last Updated: 08-Oct-14 09:20 by Joselyn Arrow (NP)

## 2014-08-20 NOTE — H&P (Signed)
PATIENT NAME:  [name], [name] MR#:  [id number] DATE OF BIRTH:  Jan 29, 1966  DATE OF ADMISSION:  01/28/2014  PRIMARY CARE PHYSICIAN:  Juluis Rainier, MD; Tora Duck L. Mindi Junker, MD   CHIEF COMPLAINT: Shortness of breath and cough.   HISTORY OF PRESENT ILLNESS: The patient is a 49 year old African American female who came into the ED with a chief complaint of shortness of breath and cough. This patient was having cough for more than 1 week. She was seen by her primary care physician who started her on a 5-day course of Augmentin. The patient had finished Augmentin with no significant improvement. The patient was still coughing and became short of breath. She had called her primary care physician as her symptoms are getting worse, so she asked her to come to the ED.  Pulse oximetry was at the 91% on room air in triage. Her initial Accu-Chek was at 300. The patient was given 2 or 3 nebulizer treatments, followed by p.o. prednisone with no significant improvement. The patient was also given IV levofloxacin and thus the patient was not feeling better, the hospitalist team is called to admit the patient. During my examination, the patient is feeling tired, worried about cough. Denies any fever. She admits that she continues to smoke one-half pack a day. She also has reported that she did not sleep well for the past 1 week as she was coughing. Chest x-ray PA and lateral views did not reveal any acute cardiopulmonary process. No infiltrates or effusions were noticed either. No other complaints.   PAST MEDICAL HISTORY: Chronic history of COPD, not oxygen dependent, diabetes mellitus type 2, non-insulin-requiring. 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   PAST SURGICAL HISTORY: Cholecystectomy.   ALLERGIES: No known drug allergies.   PSYCHOSOCIAL HISTORY: Lives with the family. Smokes half pack a day, planning  to quit. Occasional intake of alcohol. Stopped drinking  . She is on disability. Denies any illicit drug usage.   FAMILY HISTORY: Diabetes runs in her family.   HOME MEDICATIONS: Metformin extended release 500 mg 2 tablets 2 times a day, Lasix 20 mg once daily for lower extremity edema, Albuterol and ipratropium 1 puff inhalation 4 times a day, Advair 250/50 1 puff inhalation 2 times a day.  REVIEW OF SYSTEMS:   CONSTITUTIONAL: Denies any fever. Complaining of fatigue and weakness and very tired. She was not sleeping because of cough.  EYES: Denies any blurry vision, double vision or glaucoma.  ENT: Denies epistaxis, discharge, has nasal congestion.  RESPIRATORY: Complaining of cough, chronic history of COPD. Denies any hemoptysis. Complaining of shortness of breath and does not live on oxygen.  CARDIOVASCULAR: No chest pain, palpitations, syncope.  GASTROINTESTINAL: Denies nausea, vomiting, diarrhea, abdominal pain.  The patient has chronic liver cirrhosis from hepatitis C and history of alcohol intake also.  GENITOURINARY: No dysuria, hematuria.  GYNECOLOGIC AND BREAST: Denies breast mass or vaginal discharge.  ENDOCRINE: Denies polyuria, nocturia. Has a chronic history of diabetes mellitus. HEMATOLOGIC AND LYMPHATIC: No anemia, easy bruising, bleeding. INTEGUMENTARY: No acne, rash, lesions.  MUSCULOSKELETAL: No joint pain in the neck. Has chronic low back pain. Denies gout.  NEUROLOGIC: Denies vertigo, ataxia, dementia.  PSYCHIATRIC: Has chronic history of depression and posttraumatic stress disorder.  PHYSICAL EXAMINATION: VITAL SIGNS: Temperature 98.4, pulse 106, respirations 20, blood pressure is 127/84, pulse oximetry 87% on room air,  94% on 2 liters of oxygen.  GENERAL APPEARANCE:  Not in any acute distress, but looks very tired. Actually,  she was not sleeping well for the past 1 week.  HEENT: Normocephalic, atraumatic. Pupils are equally reacting to light and accommodation. No  scleral icterus. No conjunctival injection. Moist mucous membranes. NECK: Supple. No JVD. No thyromegaly. Range of motion is intact.  LUNGS: corse rhonchi, breath sounds  with minimal wheezing bilaterally. No crackles. No accessory muscle usage. No anterior chest wall tenderness on palpation.  CARDIAC: S1, S2 normal. Regular rate and rhythm, tachycardic. No murmurs.  GASTROINTESTINAL: Soft, obese. Bowel sounds are positive in all 4 quadrants. Nontender, nondistended. No masses felt.   NEUROLOGICAL:  Awake, alert, oriented x3. Cranial nerves II through XII are grossly intact. Motor and sensory are intact. Reflexes are 2+.  EXTREMITIES: Trace edema is present. No cyanosis. No clubbing.  SKIN: Warm to touch. Normal turgor. No rashes. No lesions. No bruises.  MUSCULOSKELETAL: No joint effusion, tenderness, or erythema.  PSYCHIATRIC: Normal mood and affect.   LABORATORY AND IMAGING STUDIES: Chest x-ray PA and lateral views of the chest without evidence of acute cardiopulmonary disease. Glucose 262, BUN and creatinine are normal. Sodium 134, potassium 4.4, chloride and CO2 are normal. Anion gap is 6. Serum osmolality 276. Calcium 8.4. Troponin less than 0.02. WBC and hemoglobin are normal. Hematocrit 47.7. Platelet count i s 65,000, MCV 105.   ASSESSMENT AND PLAN: A 49 year old [place] female who came into the ED with a chief complaint of remote history of cough, seen by her primary care physician and finished a 5-day course of Augmentin with no improvement. When she called her primary care physician today with worsening of symptoms, she has recommended the patient to come to the ED. In the ED, the patient was given nebulizer treatments and p.o. prednisone with no significant improvement. The patient was given IV levofloxacin and hospitalist team is called to admit the patient.  1. Acute hypoxic respiratory distress from acute bronchitis. Failed outpatient antibiotics with the complaint of acute  exacerbation of chronic obstructive pulmonary disease. The patient's initial pulse oximetry in the ED was at approximately  87% on room air. We will provide her oxygen via nasal cannula to maintain her pulse oximetry 90 to 91%. Will continue IV levofloxacin as the patient failed outpatient Augmentin. We will provide her nebulizer treatments and the patient will be on intravenous Solu-Medrol.  2. Acute bronchitis. Failed outpatient antibiotic Augmentin. Could be viral with superimposed bacteria. We will provide IV levofloxacin and nebulizer treatments.  3. Acute exacerbation of chronic obstructive pulmonary disease,   underlying acute bronchitis. The patient still continues to smoke. We will provide her IV Solu-Medrol and continue nebulizer treatments and antibiotics. Counseled patient to quit smoking for 3 to 5 minutes. Nicotine patches provided.  4. Chronic liver cirrhosis secondary to hepatitis C,  with no ascites.  5. 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 Monitor her platelet count. No bleeding or bruising noticed at this time.  6. Non-insulin-requiring diabetes mellitus. Continue home medication, metformin on sliding scale for steroid-induced hyperglycemia.  7. Nicotine abuse. The patient was counseled to quit smoking for 3 to 5 minutes and we will provide her nicotine patch.  8. Gastrointestinal prophylaxis will be provided with Pepcid and deep vein thrombosis prophylaxis with TEDS and SCDs. Chemical agents cannot be used in view of thrombocytopenia. Platelet count at around 65,000.   The diagnosis and plan of care was discussed in detail with the patient. She verbalized understanding of the plan. Total Time Spent: 50 minutes.  ____________________________ Ramonita Lab, MD ag:dw D: 01/28/2014 14:52:00 ET T: 01/28/2014 15:56:40 ET JOB#: [id number]  cc: Ramonita Lab, MD, <Dictator> Juluis Rainier, MD Sheryl L. Mindi Junker, MD  Ramonita Lab MD ELECTRONICALLY SIGNED 02/05/2014 14:54

## 2014-08-20 NOTE — Consult Note (Signed)
PATIENT NAME:  [name], [name] MR#:  [id number] DATE OF BIRTH:  [birth year]  DATE OF CONSULTATION:  04/13/2014  REQUESTING PHYSICIAN:  Elby Showers, M.D.  CONSULTING PHYSICIAN: Jasmine December, M.D.   REASON FOR CONSULTATION: Loculated pleural effusion.   I have personally seen and examined [name]. I have discussed her care with Dr. Marshia Ly who has previously seen the patient.   HISTORY OF PRESENT ILLNESS:  [name] is a 49 year old African American female who was admitted to the hospital with shortness of breath, sputum production and shoulder discomfort. She states that for the last several days prior to admission she has had increasing shortness of breath, increasing sputum production of some discolored sputum as well as some left-sided chest pain. When she was initially seen she was found to have a loculated pleural effusion on a CTA of the chest to rule out pulmonary embolism. The patient desired a transfer to [facility] where she is currently being managed for hepatitis C and is scheduled to start therapy. To date no bed has become available and the patient has requested additional consultation.   [name] is a lifelong smoker. She states that she smokes about a pack of cigarettes a day. She is currently down to a few cigarettes a day with the help of some transdermal nicotine patches. She has had a previous episode of shortness of breath requiring hospitalization several months ago. She states that she was treated with antibiotics and improved.   PAST MEDICAL HISTORY:  Significant for history of COPD, CPAP, hepatitis C, cirrhosis, asthma, posttraumatic stress disorder, alcohol abuse, and tobacco use. She has had a cholecystectomy in the past.    SOCIAL HISTORY:  She lives with her family. She has a husband and other family members. She is currently on disability.   FAMILY HISTORY:  Positive for diabetes.   REVIEW OF SYSTEMS:  Positive for no fever, no weight loss,  COPD, and cough as mentioned above. All other review of systems were asked and were negative.   PHYSICAL EXAMINATION:   GENERAL:  Revealed a pleasant, obese female in no acute distress. She is able to speak in complete sentences without significant dyspnea. She was awake, alert, and oriented. She was quite pleasant and cooperative throughout the exam.   HEENT:  NECK:  Supple. There were no palpable masses. There was no thyromegaly or lymphadenopathy.   LUNGS:  Very distant, but equal bilaterally.   HEART:  Regular. There were no murmurs.   EXTREMITIES: Revealed trace clubbing of the digits. There was no edema or cyanosis.   I have independently reviewed her chest CT. There is some loculated pleural effusion in the left upper hemithorax. This does extend down along the lateral chest wall. This certainly could be the cause of some of her discomfort. The fluid does not appear to be infected. There is no sign of any significant bronchopneumonia in the underlying lung.   PLAN:  At the present time, I am not convinced that she does indeed have an empyema. This fluid may be simple and rather easily drained percutaneously. I would entertain the possibility of asking interventional radiology to place a percutaneous catheter for management of her pleural effusion. At that time we can assess the fluid for any sign of infection. We can monitor the drainage and repeat the films to monitor the progression of her pleural effusion. If the pleural effusion is not adequately drained percutaneously we could then entertain the possibility of intrapleural thrombolytics. I  discussed this with the patient. She is agreeable to this approach. I also told her that [facility] I think it would be entirely reasonable for her to be managed there as they have all of her records and can also take care of her pleural effusion as well.   Thank you very much for allowing me to participate in her  care.   ____________________________ Sheppard Plumberimothy E. Thelma Bargeaks, MD teo:at D: 04/13/2014 07:26:45 ET T: 04/13/2014 09:45:46 ET JOB#: [id number]  cc: Marcial Pacasimothy E. Thelma Bargeaks, MD, <Dictator> Jasmine DecemberIMOTHY E Porter Nakama MD ELECTRONICALLY SIGNED 04/13/2014 14:02

## 2014-08-20 NOTE — Discharge Summary (Signed)
PATIENT NAME:  [name], [name] MR#:  [id number] DATE OF BIRTH:  [birth year]  DATE OF ADMISSION:  01/28/2014 DATE OF DISCHARGE:  01/31/2014  PRIMARY CARE PHYSICIAN:  Dr. Juluis RainierElizabeth Barnes.   FINAL DIAGNOSES:  1. Acute hypoxic respiratory failure.  2. Chronic obstructive pulmonary disease exacerbation and upper respiratory tract infection.  3. Cirrhosis with hepatitis C and thrombocytopenia.  4. Nicotine dependence.  5. Sleep apnea and obesity.   MEDICATIONS ON DISCHARGE: Include Advair Diskus 250/50 one puff twice [name] day, albuterol CFC 1 puff 4 times [name] day as needed for shortness of breath, Lasix 20 mg daily, metformin 500 mg extended-release 2 tablets twice [name] day, prednisone taper 10 mg 3 tablets day 1, 2 tablets day 2 and 3, 1 tablet day 4 and 5, half tablet day 6 and 7, Levemir 8 units subcutaneous injection at bedtime, Spiriva 18 mcg 1 inhalation daily, Levaquin 750 mg every 24 hours for 5 more days, nicotine patch 21 mg per chest wall daily.   DIET:  Low sodium diet, carbohydrate-controlled diet, regular consistency.   ACTIVITY: As tolerated with followup 1-2 weeks with Dr. Juluis RainierElizabeth Barnes.   HOSPITAL COURSE: The patient was admitted 01/28/2014 and discharged 01/31/2014. Came in with shortness of breath and cough, was admitted for acute hypoxic respiratory failure with bronchitis and COPD exacerbation.   LABORATORY AND RADIOLOGICAL DATA: Glucose on presentation 249. EKG, normal sinus rhythm, no acute ST-T wave changes. BNP 44, glucose 262, BUN 9, creatinine 0.81, sodium 134, potassium 4.4, chloride 98, CO2 of 30, calcium 8.4. Troponin negative. White blood cell count 5.3, H and H of 15.5 and 47.7, platelet count of 64,000. Chest x-ray unchanged appearance without acute cardiopulmonary disease. TSH slightly low at 0.29, recommend following up as outpatient. White blood cell count upon discharge 8.1, hemoglobin 14.7, platelet count 62,000. Creatinine 0.82.   HOSPITAL COURSE PER PROBLEM LIST:   1.  For the patient's acute hypoxic respiratory failure she was hypoxic when she came in, just sitting there upon discharge her pulse oximetry was good, but when she did walk around she dropped down in the 80s with her pulse oximetry, this alone does not qualify her for home oxygen, I do think that she will improve as treatment continues, but this can be followed up as an outpatient. The patient stated that she had to go home on 01/31/2014, so I did discharge her home.  2.  COPD exacerbation and upper respiratory tract infection. She was given Levaquin while here, we will complete the course, prednisone taper will be given. She has her inhalers at home.  3.  Cirrhosis with hepatitis C and thrombocytopenia. Follow up as outpatient.  4.  Nicotine dependence. Nicotine patch prescribed.  5.  Sleep apnea and obesity. Recommend an outpatient sleep study since the patient's pulse oximetry did drop down, I wonder she desaturates at night.  6.  Diabetes. Sugars in the 400s with steroids, I did prescribe low dose insulin. She is already on metformin also.    TIME SPENT ON DISCHARGE: 35 minutes.    ____________________________ Herschell Dimesichard J. Renae GlossWieting, MD rjw:bu D: 01/31/2014 14:53:51 ET T: 01/31/2014 17:05:27 ET JOB#: [id number]  cc: Herschell Dimesichard J. Renae GlossWieting, MD, <Dictator> Juluis RainierElizabeth Barnes, MD  Salley ScarletICHARD J Yosselyn Tax MD ELECTRONICALLY SIGNED 02/13/2014 12:47

## 2014-08-20 NOTE — Consult Note (Signed)
Brief Consult Note: Diagnosis: loculated pleural effusion.   Patient was seen by consultant.   Consult note dictated.   Comments: Loculated pleural effusion without obvious evidence of empyema.  No fever or WBC.  Would recommend consultation with Interventional Radiology for placement of percutaneous pigtail catheter with concommitant pleural fluid analysis for assessment of empyema.  If pleural effusion does not resolve with pigtail drainage, I would consider intrapleural thrombolytics.  Electronic Signatures: Jasmine Decemberaks, Marisha Renier E (MD)  (Signed 16-Dec-15 07:20)  Authored: Brief Consult Note   Last Updated: 16-Dec-15 07:20 by Jasmine Decemberaks, Jalicia Roszak E (MD)

## 2014-08-24 NOTE — H&P (Signed)
PATIENT NAME:  Brittany [name], Brittany [name] MR#:  [id number] DATE OF BIRTH:  09/18/1965  DATE OF ADMISSION:  04/08/2014  ADMITTING PHYSICIAN:   Enid Baasadhika Luverne Farone, MD  PRIMARY CARE PHYSICIAN: Juluis RainierElizabeth Barnes, MD  CHIEF COMPLAINT: Difficulty breathing and worsening cough.   HISTORY OF PRESENT ILLNESS: Brittany [name] is [name] 49 year old African American female with past medical history significant for COPD not on any home oxygen, obstructive sleep apnea on CPAP at home, insulin-requiring diabetes mellitus, hepatitis C, liver cirrhosis, posttraumatic stress disorder, depression, tobacco abuse and alcohol abuse, comes to the hospital secondary to worsening cough and shortness of breath going on for at least 3 days now. The patient was admitted and 01/2014 to the hospital for hypoxic respiratory failure at which time she had upper respiratory tract infection and COPD exacerbation. She was treated in the hospital, discharged on Levaquin at the time. The patient says she did fine after discharge but over the last couple of weeks, she is feeling that she could not get rid of [name] cough, it was mostly dry cough to begin with and now it is more productive with brownish phlegm. Continues to complain of feeling hot and cold with cold chills over the last couple of days. Her dyspnea has been getting worse to the point that she could not sleep or she could not breathe today. She became hypoxic in the emergency room, saturations were 88% on room air, in spite of 3 rounds of nebulizers, she is still hypoxic and has significant dyspnea. Chest x-ray showing interstitial pneumonitis and left pleural effusion. She is being admitted for the same.   PAST MEDICAL HISTORY:  1. Chronic COPD, not on home oxygen.  2. Insulin-dependent diabetes mellitus.  3. Hepatitis C.  4. Liver cirrhosis.  5. Posttraumatic stress disorder.  6. Chronic thrombocytopenia.  7. Tobacco use disorder.  8. Alcohol abuse.  9. Depression.  10. Asthma.   11. Obstructive sleep apnea on CPAP.   PAST SURGICAL HISTORY:  1. Fibroid resection.  2. Cholecystectomy.   ALLERGIES TO MEDICATIONS: No known drug allergies.   CURRENT HOME MEDICATIONS:  1. Advair 250/50 mg 1 puff b.i.d.  2. Albuterol inhalation solution 3 mL q 6 hours as needed for shortness of breath.  3. Combivent 1 puff 4 times [name] day.  4. Lasix 20 mg p.o. daily.  5. Levemir 8 units subcutaneous at bedtime.  6. Metformin 1000 mg p.o. b.i.d.  7. Nicotine patch 21 mg transcutaneous daily.  8. Spiriva 18 mcg HandiHaler daily 1 puff.   SOCIAL HISTORY: Lives at home with her husband and daughter. Continues to smoke, but has cut down significantly to 1/2 pack [name] week. She was discharged on nicotine patch and still continues to take the nicotine patch. Occasional alcohol abuse. Denies any drug abuse.   FAMILY HISTORY: Significant for congestive heart failure in mother and dad passed away from natural causes, but also had liver cirrhosis.   REVIEW OF SYSTEMS: CONSTITUTIONAL: No fever, fatigue, or weakness.  EYES: Positive for blurred vision. No inflammation, glaucoma or cataracts.  ENT: No tinnitus, ear pain, hearing loss, epistaxis or discharge.  RESPIRATORY: Positive for cough, wheezing. No hemoptysis. Positive for COPD, dyspnea and asthma.  CARDIOVASCULAR: No chest pain. No orthopnea, no edema, no arrhythmia. Positive for dyspnea on exertion. No palpitations or syncope.  GASTROINTESTINAL: Positive for nausea. No vomiting, diarrhea, abdominal pain, hematemesis, or melena.  GENITOURINARY: No dysuria, hematuria, renal calculus, frequency, or incontinence.  ENDOCRINE: No polyuria, nocturia, thyroid problems, heat or cold intolerance.  HEMATOLOGY: No anemia, easy bruising or bleeding.  SKIN: Positive for new lichen planus rash on left tibial shin.  MUSCULOSKELETAL: No neck fracture, pain, arthritis or gout.  NEUROLOGIC: No numbness, weakness, CVA, transient ischemic attack.   PSYCHOLOGICAL: No anxiety or insomnia.   PHYSICAL EXAMINATION: VITAL SIGNS: Temperature 98.4, pulse 118, respirations 26, blood pressure 148/89, pulse oximetry 88% on room air.  GENERAL: Heavily built, well-nourished female lying in bed, not in any acute distress.  HEENT: Normocephalic, atraumatic. Pupils equal, round, reacting to light. Anicteric sclerae. Extraocular movements intact. Oropharynx clear without erythema, mass or exudates. Poor oral hygiene noted.  NECK: Supple. No thyromegaly, JVD or carotid bruits. No lymphadenopathy.  LUNGS: Moving air bilaterally, minimal wheeze, coarse rhonchi, decreased breath sounds, especially at the left base. No use of accessory muscles for breathing.  CARDIOVASCULAR: S1, S2, regular rate and rhythm. No murmurs, rubs, or gallops.  ABDOMEN: Soft, nontender, nondistended. No hepatosplenomegaly. Normal bowel sounds.  EXTREMITIES: No pedal edema. No clubbing or cyanosis. 2+ dorsalis pedis pulses palpable bilaterally.  SKIN: She has [name] lichen planus kind of darker appearing plaque noted on left tibial shin in the mid leg region. Otherwise, no new lesions.  LYMPHATICS: No cervical or inguinal lymphadenopathy.  NEUROLOGIC: Cranial nerves intact. No focal motor or sensory deficits. PSYCHOLOGICAL:  Patient is awake, alert, oriented x3.   LABORATORY DATA: WBC 11,300, hemoglobin 15.3, hematocrit 47.7, platelet count 88,000.  Sodium 133, potassium 4.0, chloride 98, bicarbonate 30, BUN 8, creatinine 0.74, glucose 299, calcium of 7.9. BNP is only 55, troponin less than 0.02. Chest x-ray showing acute interstitial pneumonitis versus interstitial pulmonary edema, superimposed on chronic interstitial lung disease. Large left pleural effusion, which may be loculated laterally noted. EKG showing sinus tachycardia, heart rate of 170. No acute ST-T wave abnormalities.   ASSESSMENT AND PLAN: [name] 49 year old female with history of chronic obstructive pulmonary disease, not on  home oxygen, diabetes, hepatitis C, liver cirrhosis, posttraumatic stress disorder, admitted for hypoxic respiratory failure.  1. Acute hypoxic respiratory failure from chronic obstructive pulmonary disease exacerbation, pneumonia and left pleural effusion. CT of the chest was ordered. Blood cultures and IV antibiotics have been started. continue nebulizer treatments and inhalers. Continue oxygen support for now. Based on what the CT shows, if it is [name] loculated pleural effusion, we will get [name] cardiothoracic consultation. Also, BNP is low. Based on CT report, we will see if her Lasix needs to be continued or not. At this time, continue or Lasix.  2. Tobacco use disorder. Counseled against smoking for 3 minutes. Continue nicotine patch.  3. Obstructive sleep apnea. She is on CPAP at bedtime which will be continued. 4. Hepatitis C and liver cirrhosis. Stable platelet count. Monitor.  5. Posttraumatic stress disorder. Stable at this time, not on any home medications. 6. CODE STATUS: Full code.   TIME SPENT ON ADMISSION: 50 minutes.      ____________________________ Enid Baas, MD rk:kl D: 04/08/2014 16:28:44 ET T: 04/08/2014 17:04:36 ET JOB#: [id number]  cc: Enid Baas, MD, <Dictator> Juluis Rainier, MD Enid Baas MD ELECTRONICALLY SIGNED 05/03/2014 13:21

## 2014-08-24 NOTE — Discharge Summary (Signed)
PATIENT NAME:  [name], [name] MR#:  [id number] DATE OF BIRTH:  05-Jan-1966  DATE OF ADMISSION:  04/08/2014 DATE OF DISCHARGE:  04/14/2014  ADMITTING PHYSICIAN: Enid Baas, MD  DISCHARGING PHYSICIAN: Enid Baas, MD  PRIMARY CARE PHYSICIAN: [facility]  CONSULTATIONS IN THE HOSPITAL:  1.  Pulmonary consultation by Dr. Yevonne Pax.  2.  Thoracic surgery consultation by Dr. Jasmine December.  3.  Surgical consultation by Dr. Quentin Ore III.   For more details, please look at the discharge summary dictated by Dr. Nemiah Commander on 04/10/2014.   DISCHARGE DIAGNOSES:  1.  Acute on chronic hypoxic respiratory failure.  2.  Acute on chronic obstructive pulmonary disease exacerbation.  3.  Chronic respiratory failure secondary to chronic obstructive pulmonary disease, not on home oxygen.  4.  Left-sided pneumonia.  5.  Left-sided loculated pleural effusion, which turned out to be not empyema but just hemothorax.  6.  Diabetes mellitus type 2 with uncontrolled blood sugars while on steroids.  7.  Hepatitis C.  8.  Liver cirrhosis.  9.  Chronic thrombocytopenia.  10.  Obstructive sleep apnea on CPAP.  11.  Posttraumatic stress disorder.  12.  Depression/anxiety.  13.  Tobacco use disorder.   DISCHARGE HOME MEDICATION INCLUDE:  1.  Nicotine patch 21 mg transdermal daily.  2.  Metformin 1000 mg p.o. b.i.d.  3.  Albuterol nebulizer 3 mL q. 6 hours p.r.n. for shortness of breath.  4.  Byetta 5 units subcutaneously twice a day.  5.  Lasix 20 mg a p.o. daily.  6.  Advair 250/50 1 puff b.i.d.   7.  Spiriva HandiHaler 1 capsule inhalation daily.  8.  Tylenol 650 mg q. 4 hours p.r.n. for pain or fever.  9.  Prednisone taper.  10.  Glipizide 5 mg p.o. b.i.d. before meals.  11.  Insulin 20 units subcutaneously Levemir twice a day.  12.  Benzonatate Tessalon Perles 100 mg q. 6 hours p.r.n. for cough.  13.  Ambien 5 mg at bedtime.  14.  Fluocinonide 0.05% topical cream apply  to left shin rash twice a day.  15.  Senna 1 tablet p.o. b.i.d. p.r.n. for constipation.  16.  Colace 100 mg p.o. b.i.d. p.r.n. for constipation.  17.  Robitussin 10 mL q. 6 hours p.r.n. for cough.  18.  Levaquin 500 mg p.o. daily for 3 days.   DISCHARGE DIET: Low-sodium, carbohydrate-controlled diet.   DISCHARGE HOME OXYGEN: 2 L at nighttime.   DISCHARGE ACTIVITY: As tolerated.   FOLLOWUP INSTRUCTIONS:  1.  Continue CPAP at bedtime.  2.  Pulmonary follow-up in 1 week.  3.  PCP follow-up in 1-2 weeks.  4.  Advised no smoking.   LABORATORIES AND IMAGING STUDIES PRIOR TO DISCHARGE: WBC 10.1, hemoglobin 14.4, hematocrit 45.1, platelet count 92,000. Sodium 133, potassium 3.7, chloride 98, bicarbonate 29, BUN 13, creatinine 0.72, glucose 347 and calcium of 8.0. Pleural fluid cultures are negative. Ultrasound-guided thoracentesis on the left side revealing grossly bloody, 140 mL of fluid removed and sent for laboratory analysis.   X-ray post thoracentesis showing no evidence of pneumothorax, decrease in left pleural fluid after thoracentesis.   CT of the chest on admission with contrast showing negative for acute PE, partially loculated left pleural effusion, ground-glass attenuation opacity in the left upper lobe which is nonspecific versus pneumonia. Emphysema, chronic bronchitis changes. Liver cirrhosis.   Blood cultures on admission are negative. Troponins remain negative.   BRIEF HOSPITAL COURSE: [name] is  a 49 year old obese African American female with past medical history significant for COPD not on home oxygen, hypertension, sleep apnea with CPAP, diabetes, liver cirrhosis, hepatitis C, admitted for acute hypoxic respiratory failure.  1.  Acute hypoxic respiratory failure secondary to pneumonia and loculated left pleural effusion and chronic obstructive pulmonary disease exacerbation. CT of the chest revealed no pulmonary embolus. She was initially requiring 2-3 liters of oxygen  here in the hospital. There was concern for empyema. She was started on antibiotics here in the hospital. Blood cultures remain negative, with the left pleural effusion loculation, pulmonary was consulted who recommended cardiothoracic surgeon consult with catheter drainage. At that time, the patient [facility] because all of her prior care was over there. However, at the end, when the bed was available, [facility] and wanted a cardiothoracic consultation here in the hospital. Dr. Thelma Bargeaks has seen the patient and recommended interventional radiology assisted catheter drainage. The patient had a left-sided thoracentesis done, which showed only bloody fluid, which was exudative, but it was not infected, and cytology is pending at this time. The patient felt significantly better. She has not had any fevers. She is off the oxygen and is back to room air at this time, and is being discharged on Levaquin. Outpatient follow-up with Dr. Thelma Bargeaks up in 1-2 weeks has been recommended.  2.  Chronic obstructive pulmonary disease exacerbation on chronic obstructive pulmonary disease. Steroids, nebulizers,  inhalers were started. On of her home medications were continued, and she seemed to be improving and was on a steroid taper at this time. She was strongly recommended to use her CPAP machine with oxygen. She is supposed to pick up her new machine after discharge.  Uncontrolled diabetes mellitus. A1c was only 7.1, but sugars were elevated, likely being on steroids in the hospital. Medications have been adjusted around. The patient is on Levemir, Byetta, glipizide and metformin at this time.  Hepatitis C and cirrhosis. Platelet counts are stable. Just monitoring.  Depression, anxiety and PTSD. Home medications were continued.  Tobacco use disorder. Counseled strongly in the hospital. The patient was on a nicotine patch and wanted to continue it as an outpatient as well.  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 She is on fluocinolone steroid ointment at this time. Her course has been otherwise uneventful in the hospital.   DISCHARGE CONDITION: Stable.   DISCHARGE DISPOSITION: Home.   TIME SPENT ON DISCHARGE: 45 minutes.    ____________________________ Enid Baasadhika Demarko Zeimet, MD rk:MT D: 04/15/2014 14:22:15 ET T: 04/15/2014 20:44:28 ET JOB#: [id number]  cc: Enid Baasadhika Farran Amsden, MD, <Dictator> Enid BaasADHIKA Luie Laneve MD ELECTRONICALLY SIGNED 05/03/2014 14:47

## 2014-08-24 NOTE — Discharge Summary (Signed)
PATIENT NAME:  [name], [name] MR#:  [id number] DATE OF BIRTH:  1966-03-12  DATE OF ADMISSION:  04/08/2014 DATE OF DISCHARGE: 04/10/2014  ADMITTING PHYSICIAN: Enid Baas, M.D.   PRIMARY CARE PHYSICIAN: At [facility].   CONSULTATIONS IN THE HOSPITAL:  1. Pulmonary consultation with Dr. Freda Munro.  2. Surgical consultation by Dr. Michela Pitcher.    CURRENT DIAGNOSES: 1. Acute on chronic hypoxic respiratory failure.  2. Acute on chronic, chronic obstructive pulmonary disease exacerbation.  3. Chronic respiratory failure secondary to chronic obstructive pulmonary disease, not on any home oxygen.  4. Left-sided pneumonia.  5. Left-sided loculated pleural effusion, possible empyema.  6. Diabetes mellitus type 2 with hyperglycemia, uncontrolled while on steroids.  7. Hepatitis C.  8. Chronic thrombocytopenia.  9. Liver cirrhosis.  10. Obstructive sleep apnea on CPAP.  11. Posttraumatic stress disorder.  12. Depression and anxiety.  13. Tobacco use disorder.  MEDICATIONS AT THE TIME OF TRANSFER:  1. Advair 250/50 mcg 1 puff b.i.d.  2. Nicotine patch 21 mg transdermal daily.  3. Lasix 20 mg p.o. daily.  4. Metformin 1000 mg p.o. b.i.d.  5. Spiriva inhalation capsule daily.  6. Albuterol nebulizer q.6 h. p.r.n.  7. Byetta 5 units subcutaneously twice a day.  8. Solu-Medrol 40 mg IV q.8 h.  9. Tylenol 650 mg q.4 h. p.r.n. for pain or fever.  10. Glipizide 5 mg p.o. b.i.d.  11. Levemir 12 units subcutaneously twice a day.  12. Sliding scale insulin, aspart insulin.  13. Benzonatate cough capsules 100 mg q.6 h. p.r.n. for cough.  14. Ambien 5 mg at bedtime.  15. Rocephin 1 gram IV q.24 h.  16. Azithromycin 500 mg IV q.24 h.  17. Fluocinonide topical ointment twice a day to left anterior leg rash.  18. Guaifenesin 100 mg per 5 mL syrup 10 mL q.6 h. p.r.n. for cough.  19. Colace 100 mg p.o. b.i.d. p.r.n. for constipation.  20. Senokot 1 tablet p.o. b.i.d. p.r.n. for  constipation.   DISCHARGE DIET: Low-sodium, ADA 1800 calorie diet.   DISCHARGE ACTIVITY: As tolerated.   HOME OXYGEN: 2 liters.    FOLLOWUP INSTRUCTIONS: The patient is being transferred to [facility] whenever a bed is available.   LABORATORIES AND IMAGING STUDIES PRIOR TO DISCHARGE: WBC 7.6, hemoglobin 15.3, hematocrit 47.7, platelet count is 80,000.   Sodium 132, potassium 4.7, chloride 100, bicarbonate 26, BUN 12, creatinine 0.94, glucose 485, calcium of 7.8. HbA1c is 7.1.   Blood cultures are negative.   CT angiogram of the chest on 04/08/2014 showing negative for acute pulmonary embolus, partially loculated left pleural effusion, which is moderate in size, mild patchy ground-glass attenuation, and partially compressed left upper lobe is nonspecific. It could be subsegmental atelectasis versus underlying chronic changes. Superimposed bronchial pneumonia is difficult to exclude. Emphysema, chronic bronchitis, and scattered areas of chronic interstitial prominence is noted. Abnormal appearance of the liver raising concern for underlying cirrhosis. BNP on admission was only 55.   BRIEF HOSPITAL COURSE: [name] is a 49 year old, African American female, with past medical history significant for chronic COPD, not on any home oxygen, obstructive sleep apnea on CPAP, diabetes mellitus, hepatitis C, liver cirrhosis, depression, anxiety ongoing smoking, presents to the hospital secondary to worsening shortness of breath and also coughing.   1. Acute hypoxic respiratory failure. The patient was hypoxic in the Emergency Room. She is usually not on any home oxygen, here requiring 2 to 3 liters of O2. Chest x-ray on  admission showed possible left-sided pneumonia and loculated left-sided pleural effusion. She also had significant wheezing, was admitted for COPD exacerbation and pneumonia causing her respiratory failure. She is on IV steroids and nebulizers, and also her home  inhalers are being continued at this time. For her pneumonia, blood cultures were taken, which are negative so far. She is on Rocephin and azithromycin antibiotic. For the loculated pleural effusion seen on her x-ray, a CT was done, which also confirmed left-sided loculated pleural effusion. The patient is not febrile at this time. White count is normal. She was seen by both pulmonary and also surgical physician on-call here, who recommended that direct catheter drainage of the effusion is an option, but most likely she might need decortication or a video-assisted thoracoscopic procedure, so they recommended a cardiothoracic surgery consultation. She is on antibiotics at this time. She is not actively septic, but she is still dyspneic, tachypneic and hypoxic. Per the patient's request, [facility] was approached to see if they can take the patient for further cardiothoracic consultation. Dr. Oneida AlarHaithcock from thoracic surgery discussed the patient's details, and agreed for transfer, so [facility], at this time.  2. Uncontrolled diabetes mellitus. A1c is only 7.1. Sugars have been in the 400 range mostly all day yesterday, likely secondary to steroids. She is on Lantus at home, being substituted by Levemir per hospital policy here in the hospital. The dose has been increased yesterday along with her Byetta. Glipizide was added and her metformin was on hold due to her CT with contrast, but since it has been 24 hours, it will be restarted from tonight.  3. Obstructive sleep apnea. The patient has been using her CPAP at bedtime.  4. Hepatitis C and cirrhosis, platelet counts are stable, just continue to monitor at this time.  5. For depression and anxiety, her home medications are being continued.  6. 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 She is on steroid ointment at this time.  7. Tobacco use disorder. The patient has significantly cut down even as an outpatient, and is presently  on a nicotine patch.   Her course has been otherwise uneventful.   DISCHARGE CONDITION: Stable at this time.   DISCHARGE [facility]ITION: [facility] a bed [facility].    ____________________________ Enid Baasadhika Jarrah Babich, MD rk:JT D: 04/10/2014 12:27:48 ET T: 04/10/2014 13:08:51 ET JOB#: [id number]  cc: Enid Baasadhika Kalyan Barabas, MD, <Dictator> Enid BaasADHIKA Becca Bayne MD ELECTRONICALLY SIGNED 05/03/2014 14:46

## 2014-09-29 ENCOUNTER — Other Ambulatory Visit: Payer: Self-pay | Admitting: Internal Medicine

## 2014-09-29 ENCOUNTER — Ambulatory Visit
Admission: RE | Admit: 2014-09-29 | Discharge: 2014-09-29 | Disposition: A | Discharge status: Home / Self Care | Payer: Medicaid Other | Source: Ambulatory Visit | Attending: Internal Medicine | Admitting: Internal Medicine

## 2014-09-29 DIAGNOSIS — J449 Chronic obstructive pulmonary disease, unspecified: Secondary | ICD-10-CM | POA: Diagnosis not present

## 2014-09-29 DIAGNOSIS — R05 Cough: Secondary | ICD-10-CM | POA: Insufficient documentation

## 2014-09-29 DIAGNOSIS — J90 Pleural effusion, not elsewhere classified: Secondary | ICD-10-CM | POA: Insufficient documentation

## 2014-09-29 DIAGNOSIS — R059 Cough, unspecified: Secondary | ICD-10-CM

## 2014-09-29 DIAGNOSIS — F17200 Nicotine dependence, unspecified, uncomplicated: Secondary | ICD-10-CM | POA: Insufficient documentation

## 2014-10-06 ENCOUNTER — Other Ambulatory Visit: Payer: Self-pay | Admitting: Internal Medicine

## 2014-10-06 DIAGNOSIS — J189 Pneumonia, unspecified organism: Secondary | ICD-10-CM

## 2014-10-11 ENCOUNTER — Ambulatory Visit: Admission: RE | Admit: 2014-10-11 | Payer: Medicaid Other | Source: Ambulatory Visit

## 2014-11-24 ENCOUNTER — Emergency Department: Payer: Medicaid Other

## 2014-11-24 ENCOUNTER — Encounter: Payer: Self-pay | Admitting: *Deleted

## 2014-11-24 ENCOUNTER — Emergency Department
Admission: EM | Admit: 2014-11-24 | Discharge: 2014-11-24 | Disposition: A | Discharge status: Home / Self Care | Payer: Medicaid Other | Attending: Emergency Medicine | Admitting: Emergency Medicine

## 2014-11-24 DIAGNOSIS — S8991XA Unspecified injury of right lower leg, initial encounter: Secondary | ICD-10-CM | POA: Diagnosis present

## 2014-11-24 DIAGNOSIS — Y9289 Other specified places as the place of occurrence of the external cause: Secondary | ICD-10-CM | POA: Diagnosis not present

## 2014-11-24 DIAGNOSIS — W1789XA Other fall from one level to another, initial encounter: Secondary | ICD-10-CM | POA: Insufficient documentation

## 2014-11-24 DIAGNOSIS — E119 Type 2 diabetes mellitus without complications: Secondary | ICD-10-CM | POA: Diagnosis not present

## 2014-11-24 DIAGNOSIS — S5011XA Contusion of right forearm, initial encounter: Secondary | ICD-10-CM

## 2014-11-24 DIAGNOSIS — I10 Essential (primary) hypertension: Secondary | ICD-10-CM | POA: Diagnosis not present

## 2014-11-24 DIAGNOSIS — Y9389 Activity, other specified: Secondary | ICD-10-CM | POA: Diagnosis not present

## 2014-11-24 DIAGNOSIS — Y998 Other external cause status: Secondary | ICD-10-CM | POA: Diagnosis not present

## 2014-11-24 DIAGNOSIS — S62630A Displaced fracture of distal phalanx of right index finger, initial encounter for closed fracture: Secondary | ICD-10-CM | POA: Diagnosis not present

## 2014-11-24 DIAGNOSIS — Z720 Tobacco use: Secondary | ICD-10-CM | POA: Diagnosis not present

## 2014-11-24 DIAGNOSIS — S60221A Contusion of right hand, initial encounter: Secondary | ICD-10-CM | POA: Diagnosis not present

## 2014-11-24 DIAGNOSIS — S62600A Fracture of unspecified phalanx of right index finger, initial encounter for closed fracture: Secondary | ICD-10-CM

## 2014-11-24 HISTORY — DX: Acute hepatitis C without hepatic coma: B17.10

## 2014-11-24 HISTORY — DX: Sleep apnea, unspecified: G47.30

## 2014-11-24 HISTORY — DX: Type 2 diabetes mellitus without complications: E11.9

## 2014-11-24 HISTORY — DX: Hemorrhagic condition, unspecified: D69.9

## 2014-11-24 HISTORY — DX: Essential (primary) hypertension: I10

## 2014-11-24 MED ORDER — HYDROCODONE-ACETAMINOPHEN 5-325 MG PO TABS: 1.0000 | ORAL_TABLET | ORAL | Status: DC | PRN

## 2014-11-24 MED ORDER — IBUPROFEN 800 MG PO TABS: 800.0000 mg | ORAL_TABLET | Freq: Three times a day (TID) | ORAL | Status: DC

## 2014-11-24 NOTE — ED Notes (Signed)
AAOx3.  Skin warm and dry.  NAD 

## 2014-11-24 NOTE — ED Notes (Signed)
Patient fell yesterday and attempted to catch herself.  In the process she landed on her right hand.  Pain is from the right thumb to the right elbow.

## 2014-11-24 NOTE — Discharge Instructions (Signed)
Contusion A contusion is a deep bruise. Contusions are the result of an injury that caused bleeding under the skin. The contusion may turn blue, purple, or yellow. Minor injuries will give you a painless contusion, but more severe contusions may stay painful and swollen for a few weeks.  CAUSES  A contusion is usually caused by a blow, trauma, or direct force to an area of the body. SYMPTOMS   Swelling and redness of the injured area.  Bruising of the injured area.  Tenderness and soreness of the injured area.  Pain. DIAGNOSIS  The diagnosis can be made by taking a history and physical exam. An X-ray, CT scan, or MRI may be needed to determine if there were any associated injuries, such as fractures. TREATMENT  Specific treatment will depend on what area of the body was injured. In general, the best treatment for a contusion is resting, icing, elevating, and applying cold compresses to the injured area. Over-the-counter medicines may also be recommended for pain control. Ask your caregiver what the best treatment is for your contusion. HOME CARE INSTRUCTIONS   Put ice on the injured area.  Put ice in a plastic bag.  Place a towel between your skin and the bag.  Leave the ice on for 15-20 minutes, 3-4 times a day, or as directed by your health care provider.  Only take over-the-counter or prescription medicines for pain, discomfort, or fever as directed by your caregiver. Your caregiver may recommend avoiding anti-inflammatory medicines (aspirin, ibuprofen, and naproxen) for 48 hours because these medicines may increase bruising.  Rest the injured area.  If possible, elevate the injured area to reduce swelling. SEEK IMMEDIATE MEDICAL CARE IF:   You have increased bruising or swelling.  You have pain that is getting worse.  Your swelling or pain is not relieved with medicines. MAKE SURE YOU:   Understand these instructions.  Will watch your condition.  Will get help right  away if you are not doing well or get worse. Document Released: 01/23/2005 Document Revised: 04/20/2013 Document Reviewed: 02/18/2011 [facility] Patient Information 2015 [place], [place]. This information is not intended to replace advice given to you by your health care provider. Make sure you discuss any questions you have with your health care provider.   WEAR SPLINT TO FINGER FOR PROTECTION IBUPROFEN FOR PAIN AND INFLAMMATION AND NORCO FOR SEVERE PAIN ICE AND ELEVATE FOR SWELLING IF NEEDED

## 2014-11-24 NOTE — ED Notes (Signed)
Fell yesterday has pain right lower arm

## 2014-11-24 NOTE — ED Notes (Signed)
Patient transported to CT 

## 2014-11-24 NOTE — ED Provider Notes (Signed)
[facility] Emergency Department Provider Note  ____________________________________________  Time seen:  11:47 AM  I have reviewed the triage vital signs and the nursing notes.   HISTORY  Chief Complaint Arm Injury   HPI [name] is a 49 y.o. female is here with complaint of right arm pain. She states she was outside and lost her footing on a twig. She is aware that she fell forward trying to brace herself. Since that time she has continued to have right hand and forearm pain. She denies any previous injury to her arm. She has not taken any over-the-counter medication for this. She denies hitting her head or any loss of consciousness. Currently her pain is 8 out of 10.   Past Medical History  Diagnosis Date  . Diabetes mellitus without complication   . Hypertension   . Hepatitis C, acute   . Sleep apnea   . Bleeding disorder     There are no active problems to display for this patient.   History reviewed. No pertinent past surgical history.  Current Outpatient Rx  Name  Route  Sig  Dispense  Refill  . HYDROcodone-acetaminophen (NORCO/VICODIN) 5-325 MG per tablet   Oral   Take 1 tablet by mouth every 4 (four) hours as needed for moderate pain.   20 tablet   0   . ibuprofen (ADVIL,MOTRIN) 800 MG tablet   Oral   Take 1 tablet (800 mg total) by mouth 3 (three) times daily.   30 tablet   0     Allergies Review of patient's allergies indicates no known allergies.  No family history on file.  Social History History  Substance Use Topics  . Smoking status: Current Every Day Smoker  . Smokeless tobacco: Not on file  . Alcohol Use: No    Review of Systems Constitutional: No fever/chills Eyes: No visual changes. Cardiovascular: Denies chest pain. Respiratory: Denies shortness of breath. Gastrointestinal: No abdominal pain.  No nausea, no vomiting.  Genitourinary: Negative for dysuria. Musculoskeletal: Negative for back pain.  Positive for right upper extremity pain Skin: Negative for rash. Neurological: Negative for headaches, focal weakness or numbness.  10-point ROS otherwise negative.  ____________________________________________   PHYSICAL EXAM:  VITAL SIGNS: ED Triage Vitals  Enc Vitals Group     BP 11/24/14 1102 138/86 mmHg     Pulse Rate 11/24/14 1102 93     Resp 11/24/14 1102 18     Temp 11/24/14 1102 98 F (36.7 C)     Temp Source 11/24/14 1102 Oral     SpO2 11/24/14 1102 97 %     Weight 11/24/14 1102 257 lb (116.574 kg)     Height 11/24/14 1102 5\' 3"  (1.6 m)     Head Cir --      Peak Flow --      Pain Score 11/24/14 1104 8     Pain Loc --      Pain Edu? --      Excl. in GC? --     Constitutional: Alert and oriented. Well appearing and in no acute distress. Eyes: Conjunctivae are normal. PERRL. EOMI. Head: Atraumatic. Nose: No congestion/rhinnorhea. Mouth/Throat: Mucous membranes are moist.   Neck: No stridor.  No cervical tenderness on palpation. Cardiovascular: Normal rate, regular rhythm. Grossly normal heart sounds.  Good peripheral circulation. Respiratory: Normal respiratory effort.  No retractions. Lungs CTAB. Gastrointestinal: Soft and nontender. No distention.  Musculoskeletal: Right forearm wrist and hand exam shows no gross deformity. Range  of motion is restricted secondary to patient's pain. There is tenderness on palpation of the hand wrist and distal forearm with light palpation. Motor sensory function intact. No ecchymosis or noted edema in comparison with the other extremity. No lower extremity tenderness nor edema.  No joint effusions. Neurologic:  Normal speech and language. No gross focal neurologic deficits are appreciated. No gait instability. Skin:  Skin is warm, dry and intact. No rash noted. As above no ecchymosis or edema Psychiatric: Mood and affect are normal. Speech and behavior are normal.  ____________________________________________   LABS (all labs  ordered are listed, but only abnormal results are displayed)  Labs Reviewed - No data to display ____________________________________________  RADIOLOGY  Right hand x-ray shows nondisplaced transverse fracture of the distal aspect of the distal phalanx on the right second digit. Right forearm was negative for fracture. I, Tommi Rumps, personally viewed and evaluated these images as part of my medical decision making.  ____________________________________________   PROCEDURES  Procedure(s) performed: None  Critical Care performed: No  ____________________________________________   INITIAL IMPRESSION / ASSESSMENT AND PLAN / ED COURSE  Pertinent labs & imaging results that were available during my care of the patient were reviewed by me and considered in my medical decision making (see chart for details   patient was told to ice and elevate her hand as needed for swelling and for pain as needed. She is given a prescription for Norco as needed for severe pain and ibuprofen for pain and inflammation. She is follow-up with Dr. Joice Lofts if any problems. She is also to wear the finger splint for approximately 4-6 weeks.   FINAL CLINICAL IMPRESSION(S) / ED DIAGNOSES  Final diagnoses:  Fracture of phalanx of right index finger, closed, initial encounter  Contusion, forearm, right, initial encounter  Contusion, hand, right, initial encounter      Tommi Rumps, PA-C 11/24/14 1338  Sharman Cheek, MD 11/24/14 [id number]

## 2014-12-09 ENCOUNTER — Ambulatory Visit: Payer: Medicaid Other

## 2014-12-12 ENCOUNTER — Ambulatory Visit: Admission: RE | Admit: 2014-12-12 | Payer: Medicaid Other | Source: Ambulatory Visit

## 2014-12-23 ENCOUNTER — Ambulatory Visit
Admission: RE | Admit: 2014-12-23 | Discharge: 2014-12-23 | Disposition: A | Discharge status: Home / Self Care | Payer: Medicaid Other | Source: Ambulatory Visit | Attending: Internal Medicine | Admitting: Internal Medicine

## 2014-12-23 DIAGNOSIS — J439 Emphysema, unspecified: Secondary | ICD-10-CM | POA: Insufficient documentation

## 2014-12-23 DIAGNOSIS — J189 Pneumonia, unspecified organism: Secondary | ICD-10-CM | POA: Diagnosis not present

## 2014-12-23 MED ORDER — IOHEXOL 300 MG/ML  SOLN
75.0000 mL | Freq: Once | INTRAMUSCULAR | Status: AC | PRN
  Administered 2014-12-23: 75 mL via INTRAVENOUS

## 2015-05-13 IMAGING — CR DG CHEST 2V
1 series · 2 of 2 positions shown · non-contrast
Comparison: 04/13/2014, 11/13/2013, 08/16/2011. CT chest
04/08/2014.

CLINICAL DATA: Coughing and shortness of breath.

EXAM:
CHEST  2 VIEW

[Series 1: dxr chest pa (or ap) and lateral · 0.14mm/px · 2 of 2 slices shown]
[im 1/2]
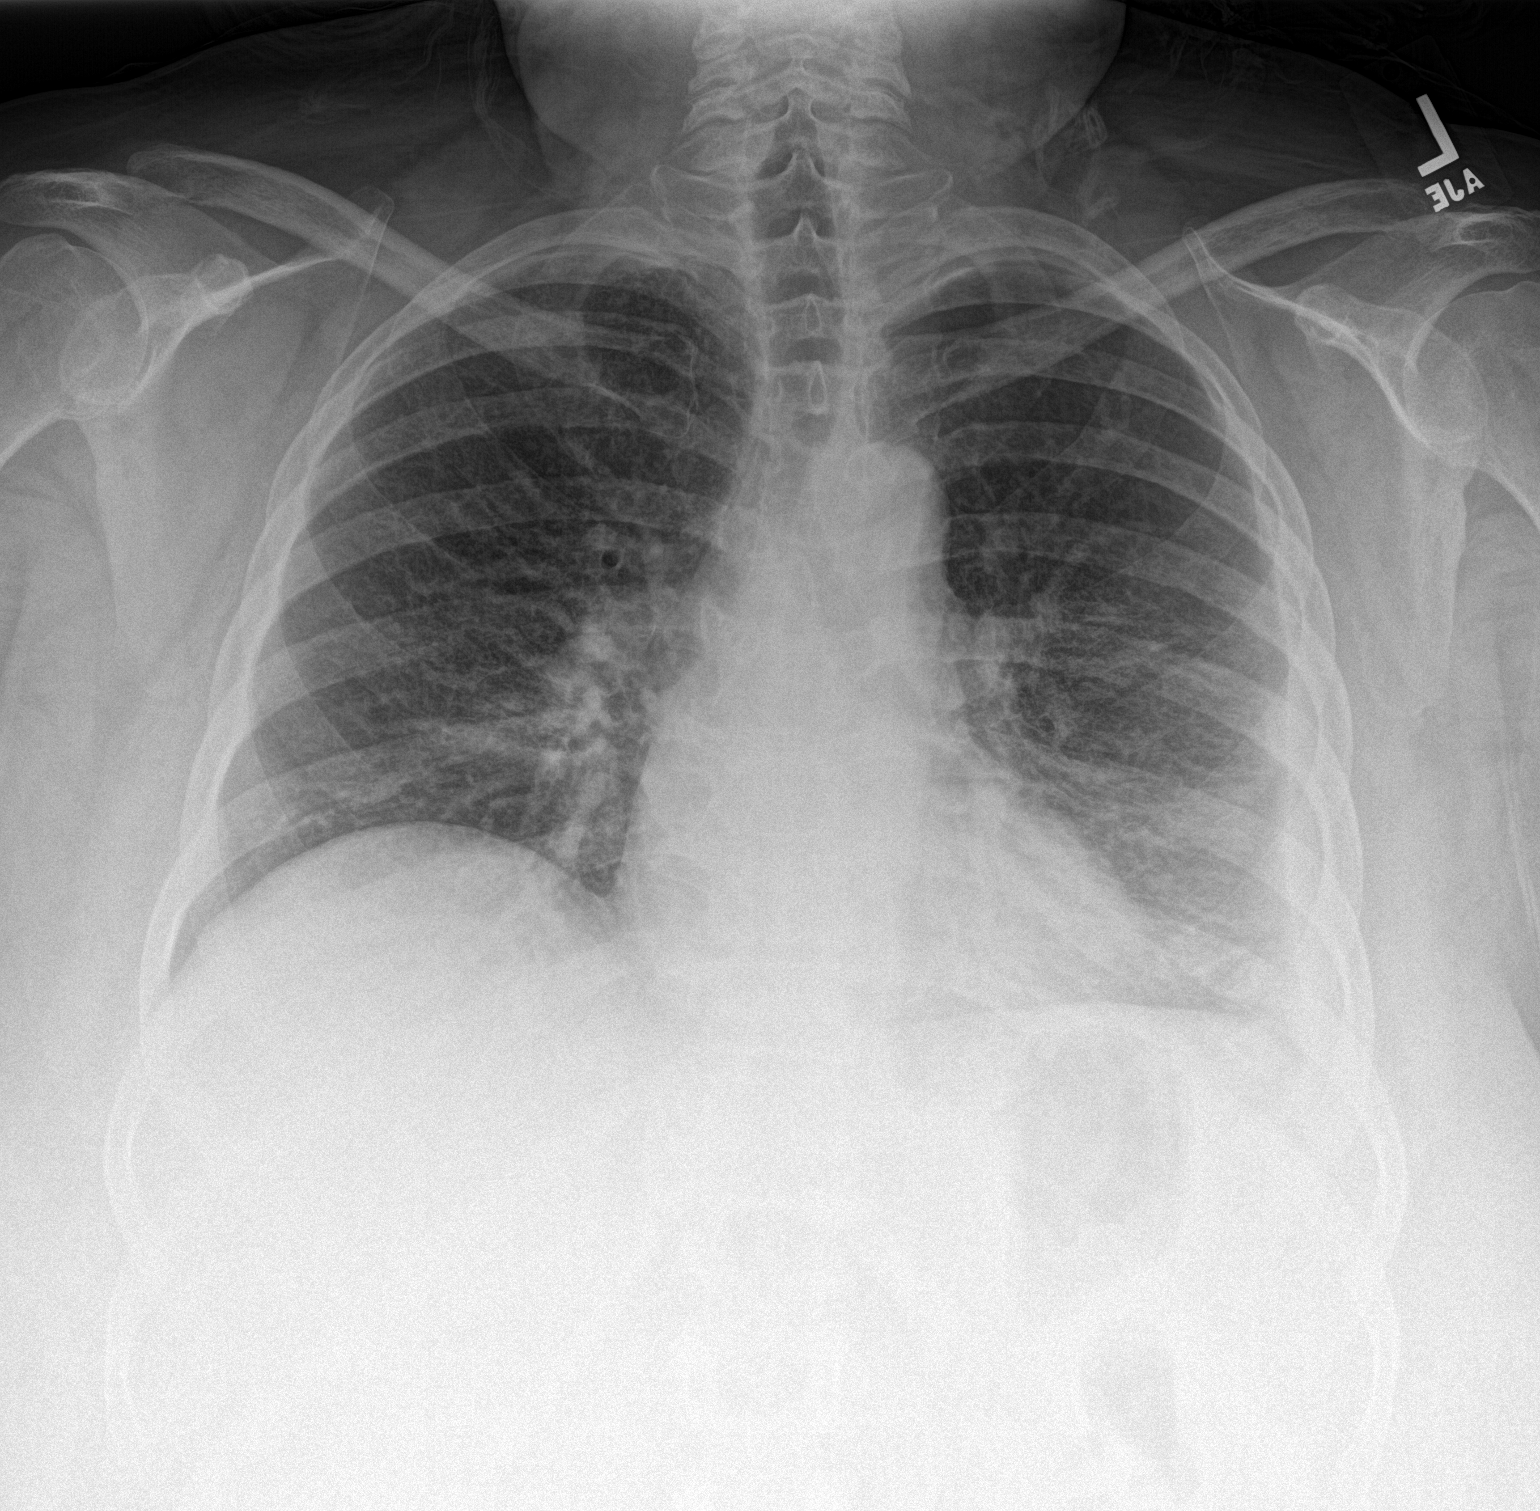
[im 2/2]
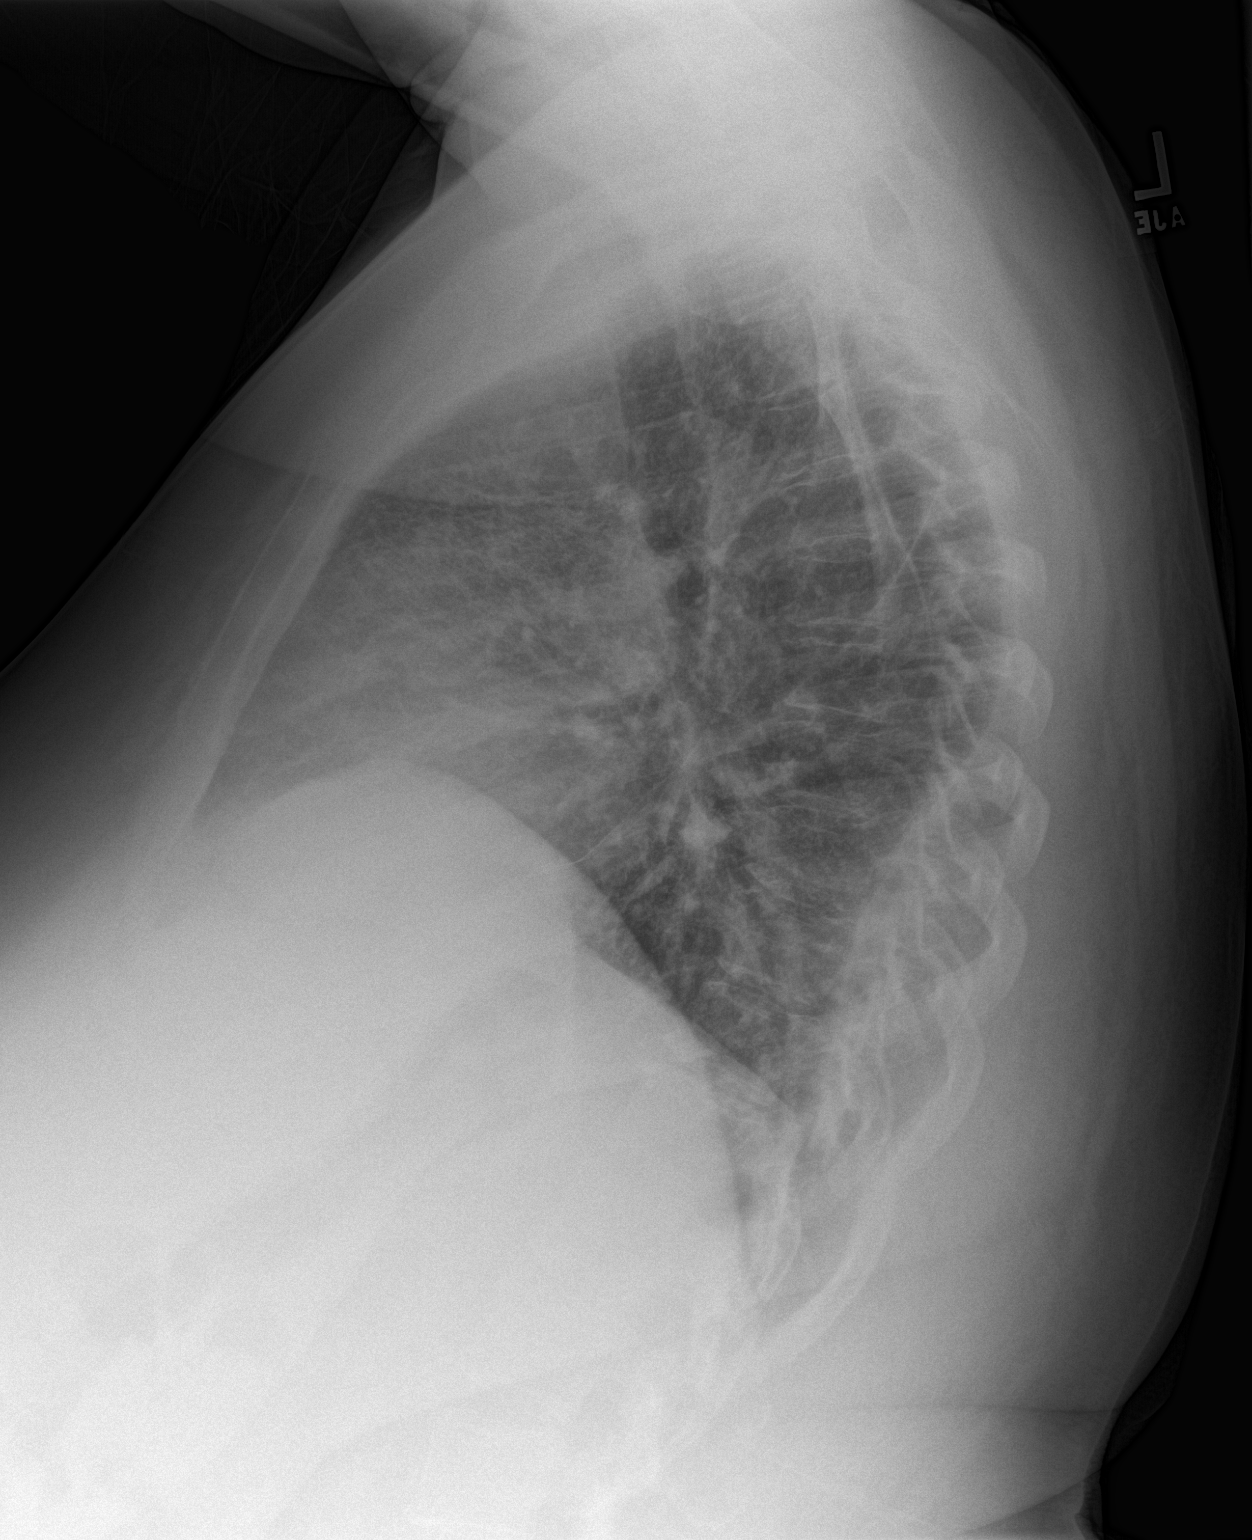

[2 of 2 positions shown; findings below may reference images not displayed]

FINDINGS: Mediastinum hilar structures normal. Chronic interstitial prominence
noted consistent with chronic interstitial lung disease.
Superimposed mild pneumonitis cannot be excluded. Persistent small
left pleural effusion. No pneumothorax. Heart size normal. No acute
bony abnormality.
IMPRESSION: 1. Chronic interstitial disease. Superimposed mild pneumonitis
cannot be excluded.
2. Persistent small left pleural effusion.  No pneumothorax.

## 2015-05-28 ENCOUNTER — Emergency Department
Admission: EM | Admit: 2015-05-28 | Discharge: 2015-05-28 | Disposition: A | Discharge status: Home / Self Care | Payer: Medicaid Other | Attending: Emergency Medicine | Admitting: Emergency Medicine

## 2015-05-28 ENCOUNTER — Emergency Department: Payer: Medicaid Other

## 2015-05-28 ENCOUNTER — Encounter: Payer: Self-pay | Admitting: Emergency Medicine

## 2015-05-28 DIAGNOSIS — Z791 Long term (current) use of non-steroidal anti-inflammatories (NSAID): Secondary | ICD-10-CM | POA: Diagnosis not present

## 2015-05-28 DIAGNOSIS — R05 Cough: Secondary | ICD-10-CM | POA: Diagnosis present

## 2015-05-28 DIAGNOSIS — E119 Type 2 diabetes mellitus without complications: Secondary | ICD-10-CM | POA: Insufficient documentation

## 2015-05-28 DIAGNOSIS — I10 Essential (primary) hypertension: Secondary | ICD-10-CM | POA: Insufficient documentation

## 2015-05-28 DIAGNOSIS — J40 Bronchitis, not specified as acute or chronic: Secondary | ICD-10-CM

## 2015-05-28 DIAGNOSIS — J209 Acute bronchitis, unspecified: Secondary | ICD-10-CM | POA: Diagnosis not present

## 2015-05-28 DIAGNOSIS — F17200 Nicotine dependence, unspecified, uncomplicated: Secondary | ICD-10-CM | POA: Diagnosis not present

## 2015-05-28 LAB — BASIC METABOLIC PANEL
ANION GAP: 4 — AB (ref 5–15)
BUN: 12 mg/dL (ref 6–20)
CHLORIDE: 103 mmol/L (ref 101–111)
CO2: 31 mmol/L (ref 22–32)
CREATININE: 0.84 mg/dL (ref 0.44–1.00)
Calcium: 8.5 mg/dL — ABNORMAL LOW (ref 8.9–10.3)
GFR calc non Af Amer: >60 mL/min (ref >60)
Glucose, Bld: 83 mg/dL (ref 65–99)
Potassium: 3.8 mmol/L (ref 3.5–5.1)
Sodium: 138 mmol/L (ref 135–145)

## 2015-05-28 LAB — CBC
HCT: 49.1 % — ABNORMAL HIGH (ref 35.0–47.0)
Hemoglobin: 16.3 g/dL — ABNORMAL HIGH (ref 12.0–16.0)
MCH: 32.6 pg (ref 26.0–34.0)
MCHC: 33.2 g/dL (ref 32.0–36.0)
MCV: 98.1 fL (ref 80.0–100.0)
Platelets: 86 10*3/uL — ABNORMAL LOW (ref 150–440)
RBC: 5 MIL/uL (ref 3.80–5.20)
RDW: 14.5 % (ref 11.5–14.5)
WBC: 8.2 10*3/uL (ref 3.6–11.0)

## 2015-05-28 LAB — TROPONIN I

## 2015-05-28 MED ORDER — AZITHROMYCIN 250 MG PO TABS: ORAL_TABLET | ORAL | Status: AC

## 2015-05-28 MED ORDER — IPRATROPIUM-ALBUTEROL 0.5-2.5 (3) MG/3ML IN SOLN
3.0000 mL | Freq: Once | RESPIRATORY_TRACT | Status: AC
  Administered 2015-05-28: 3 mL via RESPIRATORY_TRACT
  Filled 2015-05-28: qty 3

## 2015-05-28 MED ORDER — IPRATROPIUM-ALBUTEROL 0.5-2.5 (3) MG/3ML IN SOLN
RESPIRATORY_TRACT | Status: AC
  Filled 2015-05-28: qty 3

## 2015-05-28 NOTE — ED Notes (Signed)
Productive cough of thick yellow sputum, sinus congestion, right upper chest pain x 2 weeks.  Symptoms have worsened over the past 2 weeks.  States has been taking robitussin for symptoms, no relief.

## 2015-05-28 NOTE — Discharge Instructions (Signed)
Upper Respiratory Infection, Adult Most upper respiratory infections (URIs) are a viral infection of the air passages leading to the lungs. A URI affects the nose, throat, and upper air passages. The most common type of URI is nasopharyngitis and is typically referred to as "the common cold." URIs run their course and usually go away on their own. Most of the time, a URI does not require medical attention, but sometimes a bacterial infection in the upper airways can follow a viral infection. This is called a secondary infection. Sinus and middle ear infections are common types of secondary upper respiratory infections. Bacterial pneumonia can also complicate a URI. A URI can worsen asthma and chronic obstructive pulmonary disease (COPD). Sometimes, these complications can require emergency medical care and may be life threatening.  CAUSES Almost all URIs are caused by viruses. A virus is a type of germ and can spread from one person to another.  RISKS FACTORS You may be at risk for a URI if:   You smoke.   You have chronic heart or lung disease.  You have a weakened defense (immune) system.   You are very young or very old.   You have nasal allergies or asthma.  You work in crowded or poorly ventilated areas.  You work in health care facilities or schools. SIGNS AND SYMPTOMS  Symptoms typically develop 2-3 days after you come in contact with a cold virus. Most viral URIs last 7-10 days. However, viral URIs from the influenza virus (flu virus) can last 14-18 days and are typically more severe. Symptoms may include:   Runny or stuffy (congested) nose.   Sneezing.   Cough.   Sore throat.   Headache.   Fatigue.   Fever.   Loss of appetite.   Pain in your forehead, behind your eyes, and over your cheekbones (sinus pain).  Muscle aches.  DIAGNOSIS  Your health care provider may diagnose a URI by:  Physical exam.  Tests to check that your symptoms are not due to  another condition such as:  Strep throat.  Sinusitis.  Pneumonia.  Asthma. TREATMENT  A URI goes away on its own with time. It cannot be cured with medicines, but medicines may be prescribed or recommended to relieve symptoms. Medicines may help:  Reduce your fever.  Reduce your cough.  Relieve nasal congestion. HOME CARE INSTRUCTIONS   Take medicines only as directed by your health care provider.   Gargle warm saltwater or take cough drops to comfort your throat as directed by your health care provider.  Use a warm mist humidifier or inhale steam from a shower to increase air moisture. This may make it easier to breathe.  Drink enough fluid to keep your urine clear or pale yellow.   Eat soups and other clear broths and maintain good nutrition.   Rest as needed.   Return to work when your temperature has returned to normal or as your health care provider advises. You may need to stay home longer to avoid infecting others. You can also use a face mask and careful hand washing to prevent spread of the virus.  Increase the usage of your inhaler if you have asthma.   Do not use any tobacco products, including cigarettes, chewing tobacco, or electronic cigarettes. If you need help quitting, ask your health care provider. PREVENTION  The best way to protect yourself from getting a cold is to practice good hygiene.   Avoid oral or hand contact with people with cold   symptoms.   Wash your hands often if contact occurs.  There is no clear evidence that vitamin C, vitamin E, echinacea, or exercise reduces the chance of developing a cold. However, it is always recommended to get plenty of rest, exercise, and practice good nutrition.  SEEK MEDICAL CARE IF:   You are getting worse rather than better.   Your symptoms are not controlled by medicine.   You have chills.  You have worsening shortness of breath.  You have brown or red mucus.  You have yellow or brown nasal  discharge.  You have pain in your face, especially when you bend forward.  You have a fever.  You have swollen neck glands.  You have pain while swallowing.  You have white areas in the back of your throat. SEEK IMMEDIATE MEDICAL CARE IF:   You have severe or persistent:  Headache.  Ear pain.  Sinus pain.  Chest pain.  You have chronic lung disease and any of the following:  Wheezing.  Prolonged cough.  Coughing up blood.  A change in your usual mucus.  You have a stiff neck.  You have changes in your:  Vision.  Hearing.  Thinking.  Mood. MAKE SURE YOU:   Understand these instructions.  Will watch your condition.  Will get help right away if you are not doing well or get worse.   This information is not intended to replace advice given to you by your health care provider. Make sure you discuss any questions you have with your health care provider.   Document Released: 10/09/2000 Document Revised: 08/30/2014 Document Reviewed: 07/21/2013 Elsevier Interactive Patient Education 2016 [employer]  

## 2015-05-28 NOTE — ED Provider Notes (Signed)
[facility] Emergency Department Provider Note  ____________________________________________    I have reviewed the triage vital signs and the nursing notes.   HISTORY  Chief Complaint Cough and Shortness of Breath    HPI [name] is a 50 y.o. female who presents with complaints of productive cough for approximately 2 weeks. She is says is discomfort with coughing. She complains of sinus congestion as well. Over the last few days her symptoms worsen. She denies shortness of breath. She is not having fevers. No chills. No recent travel. No leg swelling or pain.    Past Medical History  Diagnosis Date  . Diabetes mellitus without complication (HCC)   . Hypertension   . Hepatitis C, acute   . Sleep apnea   . Bleeding disorder (HCC)     There are no active problems to display for this patient.   History reviewed. No pertinent past surgical history.  Current Outpatient Rx  Name  Route  Sig  Dispense  Refill  . azithromycin (ZITHROMAX Z-PAK) 250 MG tablet      Take 2 tablets (500 mg) on  Day 1,  followed by 1 tablet (250 mg) once daily on Days 2 through 5.   6 each   0   . HYDROcodone-acetaminophen (NORCO/VICODIN) 5-325 MG per tablet   Oral   Take 1 tablet by mouth every 4 (four) hours as needed for moderate pain.   20 tablet   0   . ibuprofen (ADVIL,MOTRIN) 800 MG tablet   Oral   Take 1 tablet (800 mg total) by mouth 3 (three) times daily.   30 tablet   0     Allergies Review of patient's allergies indicates no known allergies.  No family history on file.  Social History Social History  Substance Use Topics  . Smoking status: Current Every Day Smoker  . Smokeless tobacco: None  . Alcohol Use: No    Review of Systems  Constitutional: Negative for fever. Eyes: Negative for visual changes. ENT: Negative for sore throat   Genitourinary: Negative for dysuria. Musculoskeletal: Negative for back pain. Skin: Negative for  rash. Neurological: Negative for headaches or focal weakness   ____________________________________________   PHYSICAL EXAM:  VITAL SIGNS: ED Triage Vitals  Enc Vitals Group     BP 05/28/15 1744 141/100 mmHg     Pulse Rate 05/28/15 1744 96     Resp 05/28/15 1744 18     Temp 05/28/15 1744 98.3 F (36.8 C)     Temp Source 05/28/15 1744 Oral     SpO2 05/28/15 1744 96 %     Weight 05/28/15 1744 245 lb (111.131 kg)     Height 05/28/15 1744  (1.575 m)     Head Cir --      Peak Flow --      Pain Score 05/28/15 1746 5     Pain Loc --      Pain Edu? --      Excl. in GC? --      Constitutional: Alert and oriented. Well appearing and in no distress. Eyes: Conjunctivae are normal.  ENT   Head: Normocephalic and atraumatic.   Mouth/Throat: Mucous membranes are moist. Cardiovascular: Normal rate, regular rhythm.  Respiratory: Normal respiratory effort without tachypnea nor retractions. Left lower lung wheeze Gastrointestinal: Soft and non-tender in all quadrants. No distention. There is no CVA tenderness. Musculoskeletal: Nontender with normal range of motion in all extremities. Neurologic:  Normal speech and language. No  gross focal neurologic deficits are appreciated. Skin:  Skin is warm, dry and intact. No rash noted. Psychiatric: Mood and affect are normal. Patient exhibits appropriate insight and judgment.  ____________________________________________    LABS (pertinent positives/negatives)  Labs Reviewed  BASIC METABOLIC PANEL - Abnormal; Notable for the following:    Calcium 8.5 (*)    Anion gap 4 (*)    All other components within normal limits  CBC - Abnormal; Notable for the following:    Hemoglobin 16.3 (*)    HCT 49.1 (*)    Platelets 86 (*)    All other components within normal limits  TROPONIN I    ____________________________________________  EKG ED ECG REPORT I, Jene Every, the attending physician, personally viewed and interpreted  this ECG.  Date: 05/28/2015 EKG Time: 5:34 PM Rate: 92 Rhythm: normal sinus rhythm QRS Axis: normal Intervals: normal ST/T Wave abnormalities: normal Conduction Disturbances: none Narrative Interpretation: unremarkable   ____________________________________________    RADIOLOGY I have personally reviewed any xrays that were ordered on this patient: Chest x-ray unremarkable  ____________________________________________   PROCEDURES  Procedure(s) performed: none   ____________________________________________   INITIAL IMPRESSION / ASSESSMENT AND PLAN / ED COURSE  Pertinent labs & imaging results that were available during my care of the patient were reviewed by me and considered in my medical decision making (see chart for details).  Patient's symptoms consistent with bronchitis. We have given her a DuoNeb in the department which is helped her symptoms. Given her history of diabetes and continued productive cough I will prescribe antibiotics. I recommended close PCP follow-up. Return precautions discussed  ____________________________________________   FINAL CLINICAL IMPRESSION(S) / ED DIAGNOSES  Final diagnoses:  Bronchitis      Jene Every, MD 05/28/15 2203

## 2015-05-28 NOTE — ED Notes (Signed)
Administered DuoNeb per MD orders

## 2015-05-28 NOTE — ED Notes (Signed)
Pt verbalizes understanding of discharge instructions.

## 2015-07-02 ENCOUNTER — Inpatient Hospital Stay: Payer: Medicaid Other | Admitting: Anesthesiology

## 2015-07-02 ENCOUNTER — Encounter
Admission: EM | Disposition: A | Discharge status: Home / Self Care | Payer: Self-pay | Source: Home / Self Care | Attending: Orthopedic Surgery

## 2015-07-02 ENCOUNTER — Inpatient Hospital Stay: Payer: Medicaid Other

## 2015-07-02 ENCOUNTER — Encounter: Payer: Self-pay | Admitting: Emergency Medicine

## 2015-07-02 ENCOUNTER — Emergency Department: Payer: Medicaid Other

## 2015-07-02 ENCOUNTER — Inpatient Hospital Stay
Admission: EM | Admit: 2015-07-02 | Discharge: 2015-07-04 | DRG: 493 | Disposition: A | Discharge status: Home / Self Care | Payer: Medicaid Other | Attending: Orthopedic Surgery | Admitting: Orthopedic Surgery

## 2015-07-02 DIAGNOSIS — J449 Chronic obstructive pulmonary disease, unspecified: Secondary | ICD-10-CM | POA: Diagnosis present

## 2015-07-02 DIAGNOSIS — W19XXXA Unspecified fall, initial encounter: Secondary | ICD-10-CM | POA: Diagnosis present

## 2015-07-02 DIAGNOSIS — S82142A Displaced bicondylar fracture of left tibia, initial encounter for closed fracture: Secondary | ICD-10-CM | POA: Diagnosis present

## 2015-07-02 DIAGNOSIS — I10 Essential (primary) hypertension: Secondary | ICD-10-CM | POA: Diagnosis present

## 2015-07-02 DIAGNOSIS — E119 Type 2 diabetes mellitus without complications: Secondary | ICD-10-CM | POA: Diagnosis present

## 2015-07-02 DIAGNOSIS — Z01811 Encounter for preprocedural respiratory examination: Secondary | ICD-10-CM

## 2015-07-02 DIAGNOSIS — Z6841 Body Mass Index (BMI) 40.0 and over, adult: Secondary | ICD-10-CM

## 2015-07-02 DIAGNOSIS — F17200 Nicotine dependence, unspecified, uncomplicated: Secondary | ICD-10-CM | POA: Diagnosis present

## 2015-07-02 DIAGNOSIS — E785 Hyperlipidemia, unspecified: Secondary | ICD-10-CM | POA: Diagnosis present

## 2015-07-02 DIAGNOSIS — Z794 Long term (current) use of insulin: Secondary | ICD-10-CM

## 2015-07-02 DIAGNOSIS — G4730 Sleep apnea, unspecified: Secondary | ICD-10-CM | POA: Diagnosis present

## 2015-07-02 DIAGNOSIS — F10129 Alcohol abuse with intoxication, unspecified: Secondary | ICD-10-CM | POA: Diagnosis present

## 2015-07-02 DIAGNOSIS — D696 Thrombocytopenia, unspecified: Secondary | ICD-10-CM | POA: Diagnosis present

## 2015-07-02 DIAGNOSIS — Z23 Encounter for immunization: Secondary | ICD-10-CM

## 2015-07-02 DIAGNOSIS — S82402A Unspecified fracture of shaft of left fibula, initial encounter for closed fracture: Secondary | ICD-10-CM | POA: Diagnosis present

## 2015-07-02 DIAGNOSIS — S82143A Displaced bicondylar fracture of unspecified tibia, initial encounter for closed fracture: Secondary | ICD-10-CM

## 2015-07-02 DIAGNOSIS — E669 Obesity, unspecified: Secondary | ICD-10-CM | POA: Diagnosis present

## 2015-07-02 HISTORY — PX: ORIF TIBIA PLATEAU: SHX2132

## 2015-07-02 LAB — ABO/RH: ABO/RH(D): A POS

## 2015-07-02 LAB — CBC WITH DIFFERENTIAL/PLATELET
Basophils Absolute: 0.1 10*3/uL (ref 0–0.1)
EOS ABS: 0.1 10*3/uL (ref 0–0.7)
Eosinophils Relative: 1 %
HCT: 47.3 % — ABNORMAL HIGH (ref 35.0–47.0)
Hemoglobin: 15.8 g/dL (ref 12.0–16.0)
Lymphocytes Relative: 28 %
Lymphs Abs: 1.7 10*3/uL (ref 1.0–3.6)
MCH: 33.1 pg (ref 26.0–34.0)
MCHC: 33.4 g/dL (ref 32.0–36.0)
MCV: 98.9 fL (ref 80.0–100.0)
MONO ABS: 0.6 10*3/uL (ref 0.2–0.9)
Neutro Abs: 3.7 10*3/uL (ref 1.4–6.5)
Platelets: 91 10*3/uL — ABNORMAL LOW (ref 150–440)
RBC: 4.78 MIL/uL (ref 3.80–5.20)
RDW: 14.5 % (ref 11.5–14.5)
WBC: 6.1 10*3/uL (ref 3.6–11.0)

## 2015-07-02 LAB — COMPREHENSIVE METABOLIC PANEL
ALT: 74 U/L — ABNORMAL HIGH (ref 14–54)
AST: 81 U/L — ABNORMAL HIGH (ref 15–41)
Albumin: 2.9 g/dL — ABNORMAL LOW (ref 3.5–5.0)
Alkaline Phosphatase: 114 U/L (ref 38–126)
Anion gap: 4 — ABNORMAL LOW (ref 5–15)
BUN: 16 mg/dL (ref 6–20)
CHLORIDE: 100 mmol/L — AB (ref 101–111)
CO2: 30 mmol/L (ref 22–32)
CREATININE: 0.86 mg/dL (ref 0.44–1.00)
Calcium: 8.2 mg/dL — ABNORMAL LOW (ref 8.9–10.3)
GFR calc Af Amer: >60 mL/min (ref >60)
GFR calc non Af Amer: >60 mL/min (ref >60)
Glucose, Bld: 192 mg/dL — ABNORMAL HIGH (ref 65–99)
POTASSIUM: 3.6 mmol/L (ref 3.5–5.1)
Sodium: 134 mmol/L — ABNORMAL LOW (ref 135–145)
Total Bilirubin: 1.1 mg/dL (ref 0.3–1.2)
Total Protein: 8.2 g/dL — ABNORMAL HIGH (ref 6.5–8.1)

## 2015-07-02 LAB — APTT: APTT: 35 s (ref 24–36)

## 2015-07-02 LAB — SURGICAL PCR SCREEN
MRSA, PCR: NEGATIVE
Staphylococcus aureus: NEGATIVE

## 2015-07-02 LAB — GLUCOSE, CAPILLARY
Glucose-Capillary: 170 mg/dL — ABNORMAL HIGH (ref 65–99)
Glucose-Capillary: 156 mg/dL — ABNORMAL HIGH (ref 65–99)

## 2015-07-02 LAB — TYPE AND SCREEN
ABO/RH(D): A POS
Antibody Screen: NEGATIVE

## 2015-07-02 LAB — PROTIME-INR
INR: 1.36
PROTHROMBIN TIME: 16.9 s — AB (ref 11.4–15.0)

## 2015-07-02 LAB — ETHANOL: Alcohol, Ethyl (B): 171 mg/dL — ABNORMAL HIGH (ref <5)

## 2015-07-02 SURGERY — OPEN REDUCTION INTERNAL FIXATION (ORIF) TIBIAL PLATEAU
Anesthesia: General | Laterality: Left

## 2015-07-02 MED ORDER — PHENYLEPHRINE HCL 10 MG/ML IJ SOLN
INTRAMUSCULAR | Status: DC | PRN
  Administered 2015-07-02: 200 ug via INTRAVENOUS

## 2015-07-02 MED ORDER — METOCLOPRAMIDE HCL 5 MG/ML IJ SOLN: 5.0000 mg | Freq: Three times a day (TID) | INTRAMUSCULAR | Status: DC | PRN

## 2015-07-02 MED ORDER — LORAZEPAM 2 MG/ML IJ SOLN: 1.0000 mg | Freq: Four times a day (QID) | INTRAMUSCULAR | Status: DC | PRN

## 2015-07-02 MED ORDER — FENTANYL CITRATE (PF) 100 MCG/2ML IJ SOLN
INTRAMUSCULAR | Status: DC | PRN
  Administered 2015-07-02 (×2): 50 ug via INTRAVENOUS
  Administered 2015-07-02: 100 ug via INTRAVENOUS

## 2015-07-02 MED ORDER — CEFAZOLIN SODIUM-DEXTROSE 2-3 GM-% IV SOLR
2.0000 g | INTRAVENOUS | Status: AC
  Administered 2015-07-02: 3 g via INTRAVENOUS
  Filled 2015-07-02: qty 50

## 2015-07-02 MED ORDER — NEOMYCIN-POLYMYXIN B GU 40-200000 IR SOLN
Status: AC
  Filled 2015-07-02: qty 4

## 2015-07-02 MED ORDER — NALOXONE HCL 0.4 MG/ML IJ SOLN
0.4000 mg | INTRAMUSCULAR | Status: DC | PRN
Start: 2015-07-02 — End: 2015-07-04

## 2015-07-02 MED ORDER — HYDRALAZINE HCL 20 MG/ML IJ SOLN: 10.0000 mg | Freq: Four times a day (QID) | INTRAMUSCULAR | Status: DC | PRN

## 2015-07-02 MED ORDER — NICOTINE 21 MG/24HR TD PT24
21.0000 mg | MEDICATED_PATCH | Freq: Every day | TRANSDERMAL | Status: DC
  Administered 2015-07-03 – 2015-07-04 (×2): 21 mg via TRANSDERMAL
  Filled 2015-07-02 (×2): qty 1

## 2015-07-02 MED ORDER — IPRATROPIUM-ALBUTEROL 0.5-2.5 (3) MG/3ML IN SOLN
RESPIRATORY_TRACT | Status: AC
  Filled 2015-07-02: qty 3

## 2015-07-02 MED ORDER — OXYCODONE HCL 5 MG PO TABS: 5.0000 mg | ORAL_TABLET | Freq: Once | ORAL | Status: DC | PRN

## 2015-07-02 MED ORDER — OXYCODONE HCL 5 MG/5ML PO SOLN: 5.0000 mg | Freq: Once | ORAL | Status: DC | PRN

## 2015-07-02 MED ORDER — PROPOFOL 10 MG/ML IV BOLUS
INTRAVENOUS | Status: DC | PRN
  Administered 2015-07-02: 150 mg via INTRAVENOUS

## 2015-07-02 MED ORDER — ONDANSETRON HCL 4 MG/2ML IJ SOLN
4.0000 mg | Freq: Once | INTRAMUSCULAR | Status: AC
  Administered 2015-07-02: 4 mg via INTRAVENOUS
  Filled 2015-07-02: qty 2

## 2015-07-02 MED ORDER — HYDROMORPHONE HCL 1 MG/ML IJ SOLN
1.0000 mg | INTRAMUSCULAR | Status: DC | PRN
  Administered 2015-07-02 – 2015-07-03 (×2): 1 mg via INTRAVENOUS
  Filled 2015-07-02 (×2): qty 1

## 2015-07-02 MED ORDER — SUCCINYLCHOLINE CHLORIDE 20 MG/ML IJ SOLN
INTRAMUSCULAR | Status: DC | PRN
  Administered 2015-07-02: 100 mg via INTRAVENOUS

## 2015-07-02 MED ORDER — SUGAMMADEX SODIUM 500 MG/5ML IV SOLN
INTRAVENOUS | Status: DC | PRN
Start: 2015-07-02 — End: 2015-07-02
  Administered 2015-07-02: 242 mg via INTRAVENOUS

## 2015-07-02 MED ORDER — DIPHENHYDRAMINE HCL 12.5 MG/5ML PO ELIX
12.5000 mg | ORAL_SOLUTION | ORAL | Status: DC | PRN
  Filled 2015-07-02: qty 10

## 2015-07-02 MED ORDER — HYDROMORPHONE HCL 1 MG/ML IJ SOLN
1.0000 mg | INTRAMUSCULAR | Status: DC | PRN
Start: 2015-07-02 — End: 2015-07-02
  Administered 2015-07-02 (×2): 1 mg via INTRAVENOUS
  Filled 2015-07-02 (×2): qty 1

## 2015-07-02 MED ORDER — SODIUM CHLORIDE 0.9 % IV SOLN
INTRAVENOUS | Status: DC
  Administered 2015-07-02: 06:00:00 via INTRAVENOUS

## 2015-07-02 MED ORDER — ONDANSETRON HCL 4 MG/2ML IJ SOLN
INTRAMUSCULAR | Status: DC | PRN
  Administered 2015-07-02: 4 mg via INTRAVENOUS

## 2015-07-02 MED ORDER — OXYCODONE-ACETAMINOPHEN 5-325 MG PO TABS
ORAL_TABLET | ORAL | Status: AC
  Filled 2015-07-02: qty 1

## 2015-07-02 MED ORDER — MIDAZOLAM HCL 2 MG/2ML IJ SOLN
INTRAMUSCULAR | Status: DC | PRN
  Administered 2015-07-02: 2 mg via INTRAVENOUS

## 2015-07-02 MED ORDER — BISACODYL 5 MG PO TBEC: 5.0000 mg | DELAYED_RELEASE_TABLET | Freq: Every day | ORAL | Status: DC | PRN

## 2015-07-02 MED ORDER — MAGNESIUM HYDROXIDE 400 MG/5ML PO SUSP: 30.0000 mL | Freq: Every day | ORAL | Status: DC | PRN

## 2015-07-02 MED ORDER — ACETAMINOPHEN 325 MG PO TABS: 650.0000 mg | ORAL_TABLET | Freq: Four times a day (QID) | ORAL | Status: DC | PRN

## 2015-07-02 MED ORDER — NEOMYCIN-POLYMYXIN B GU 40-200000 IR SOLN
Status: DC | PRN
  Administered 2015-07-02: 4 mL

## 2015-07-02 MED ORDER — MAGNESIUM CITRATE PO SOLN: 1.0000 | Freq: Once | ORAL | Status: DC | PRN

## 2015-07-02 MED ORDER — SODIUM CHLORIDE 0.9 % IV SOLN
INTRAVENOUS | Status: DC
  Administered 2015-07-02: via INTRAVENOUS

## 2015-07-02 MED ORDER — METHOCARBAMOL 500 MG PO TABS: 500.0000 mg | ORAL_TABLET | Freq: Four times a day (QID) | ORAL | Status: DC | PRN

## 2015-07-02 MED ORDER — FENTANYL CITRATE (PF) 100 MCG/2ML IJ SOLN
INTRAMUSCULAR | Status: AC
  Filled 2015-07-02: qty 2

## 2015-07-02 MED ORDER — OXYCODONE HCL 5 MG PO TABS: 5.0000 mg | ORAL_TABLET | ORAL | Status: DC | PRN

## 2015-07-02 MED ORDER — DOCUSATE SODIUM 100 MG PO CAPS
100.0000 mg | ORAL_CAPSULE | Freq: Two times a day (BID) | ORAL | Status: DC
  Administered 2015-07-02 – 2015-07-04 (×4): 100 mg via ORAL
  Filled 2015-07-02 (×4): qty 1

## 2015-07-02 MED ORDER — OXYCODONE HCL 5 MG PO TABS
5.0000 mg | ORAL_TABLET | ORAL | Status: DC | PRN
  Administered 2015-07-03 (×2): 5 mg via ORAL
  Administered 2015-07-03: 10 mg via ORAL
  Administered 2015-07-03: 5 mg via ORAL
  Administered 2015-07-04 (×2): 10 mg via ORAL
  Administered 2015-07-04: 5 mg via ORAL
  Filled 2015-07-02: qty 2
  Filled 2015-07-02 (×2): qty 1
  Filled 2015-07-02: qty 2
  Filled 2015-07-02: qty 1
  Filled 2015-07-02: qty 2

## 2015-07-02 MED ORDER — INSULIN ASPART 100 UNIT/ML ~~LOC~~ SOLN
0.0000 [IU] | Freq: Three times a day (TID) | SUBCUTANEOUS | Status: DC
  Administered 2015-07-02: 4 [IU] via SUBCUTANEOUS
  Administered 2015-07-03 – 2015-07-04 (×4): 3 [IU] via SUBCUTANEOUS
  Filled 2015-07-02 (×5): qty 3
  Filled 2015-07-02: qty 4

## 2015-07-02 MED ORDER — DEXTROSE 5 % IV SOLN
500.0000 mg | Freq: Four times a day (QID) | INTRAVENOUS | Status: DC | PRN
  Filled 2015-07-02: qty 5

## 2015-07-02 MED ORDER — ENALAPRIL MALEATE 10 MG PO TABS
20.0000 mg | ORAL_TABLET | Freq: Every day | ORAL | Status: DC
  Administered 2015-07-03 – 2015-07-04 (×2): 20 mg via ORAL
  Filled 2015-07-02 (×3): qty 2

## 2015-07-02 MED ORDER — MENTHOL 3 MG MT LOZG
1.0000 | LOZENGE | OROMUCOSAL | Status: DC | PRN
  Filled 2015-07-02: qty 9

## 2015-07-02 MED ORDER — VITAMIN B-1 100 MG PO TABS
100.0000 mg | ORAL_TABLET | Freq: Every day | ORAL | Status: DC
  Administered 2015-07-03 – 2015-07-04 (×2): 100 mg via ORAL
  Filled 2015-07-02 (×2): qty 1

## 2015-07-02 MED ORDER — ENOXAPARIN SODIUM 30 MG/0.3ML ~~LOC~~ SOLN
30.0000 mg | Freq: Two times a day (BID) | SUBCUTANEOUS | Status: DC
  Administered 2015-07-03: 30 mg via SUBCUTANEOUS
  Filled 2015-07-02: qty 0.3

## 2015-07-02 MED ORDER — LORAZEPAM 1 MG PO TABS: 1.0000 mg | ORAL_TABLET | Freq: Four times a day (QID) | ORAL | Status: DC | PRN

## 2015-07-02 MED ORDER — ACETAMINOPHEN 650 MG RE SUPP: 650.0000 mg | Freq: Four times a day (QID) | RECTAL | Status: DC | PRN

## 2015-07-02 MED ORDER — IPRATROPIUM-ALBUTEROL 0.5-2.5 (3) MG/3ML IN SOLN
3.0000 mL | Freq: Once | RESPIRATORY_TRACT | Status: AC
  Administered 2015-07-02: 3 mL via RESPIRATORY_TRACT

## 2015-07-02 MED ORDER — KETOROLAC TROMETHAMINE 15 MG/ML IJ SOLN
15.0000 mg | Freq: Four times a day (QID) | INTRAMUSCULAR | Status: AC
  Administered 2015-07-02 – 2015-07-03 (×2): 15 mg via INTRAVENOUS
  Filled 2015-07-02 (×6): qty 1

## 2015-07-02 MED ORDER — CEFAZOLIN SODIUM-DEXTROSE 2-3 GM-% IV SOLR
2.0000 g | Freq: Four times a day (QID) | INTRAVENOUS | Status: AC
  Administered 2015-07-02 – 2015-07-03 (×2): 2 g via INTRAVENOUS
  Filled 2015-07-02 (×2): qty 50

## 2015-07-02 MED ORDER — METFORMIN HCL 500 MG PO TABS
500.0000 mg | ORAL_TABLET | Freq: Two times a day (BID) | ORAL | Status: DC
  Administered 2015-07-03 – 2015-07-04 (×2): 500 mg via ORAL
  Filled 2015-07-02 (×3): qty 1

## 2015-07-02 MED ORDER — METOCLOPRAMIDE HCL 5 MG PO TABS: 5.0000 mg | ORAL_TABLET | Freq: Three times a day (TID) | ORAL | Status: DC | PRN

## 2015-07-02 MED ORDER — THIAMINE HCL 100 MG/ML IJ SOLN: 100.0000 mg | Freq: Every day | INTRAMUSCULAR | Status: DC

## 2015-07-02 MED ORDER — BISACODYL 5 MG PO TBEC
5.0000 mg | DELAYED_RELEASE_TABLET | Freq: Every day | ORAL | Status: DC | PRN
  Administered 2015-07-03: 5 mg via ORAL
  Filled 2015-07-02: qty 1

## 2015-07-02 MED ORDER — ONDANSETRON HCL 4 MG/2ML IJ SOLN: 4.0000 mg | Freq: Four times a day (QID) | INTRAMUSCULAR | Status: DC | PRN

## 2015-07-02 MED ORDER — PHENOL 1.4 % MT LIQD
1.0000 | OROMUCOSAL | Status: DC | PRN
  Filled 2015-07-02: qty 177

## 2015-07-02 MED ORDER — MORPHINE SULFATE (PF) 4 MG/ML IV SOLN
4.0000 mg | Freq: Once | INTRAVENOUS | Status: AC
  Administered 2015-07-02: 4 mg via INTRAVENOUS
  Filled 2015-07-02: qty 1

## 2015-07-02 MED ORDER — ONDANSETRON HCL 4 MG PO TABS: 4.0000 mg | ORAL_TABLET | Freq: Four times a day (QID) | ORAL | Status: DC | PRN

## 2015-07-02 MED ORDER — ALUM & MAG HYDROXIDE-SIMETH 200-200-20 MG/5ML PO SUSP: 30.0000 mL | ORAL | Status: DC | PRN

## 2015-07-02 MED ORDER — INSULIN ASPART 100 UNIT/ML ~~LOC~~ SOLN: 0.0000 [IU] | Freq: Every day | SUBCUTANEOUS | Status: DC

## 2015-07-02 MED ORDER — OXYCODONE-ACETAMINOPHEN 5-325 MG PO TABS
1.0000 | ORAL_TABLET | Freq: Once | ORAL | Status: AC
  Administered 2015-07-02: 1 via ORAL

## 2015-07-02 MED ORDER — ADULT MULTIVITAMIN W/MINERALS CH: 1.0000 | ORAL_TABLET | Freq: Every day | ORAL | Status: DC

## 2015-07-02 MED ORDER — ALBUTEROL SULFATE HFA 108 (90 BASE) MCG/ACT IN AERS
INHALATION_SPRAY | RESPIRATORY_TRACT | Status: DC | PRN
  Administered 2015-07-02: 8 via RESPIRATORY_TRACT

## 2015-07-02 MED ORDER — FOLIC ACID 1 MG PO TABS
1.0000 mg | ORAL_TABLET | Freq: Every day | ORAL | Status: DC
  Administered 2015-07-03 – 2015-07-04 (×2): 1 mg via ORAL
  Filled 2015-07-02: qty 1

## 2015-07-02 MED ORDER — ROCURONIUM BROMIDE 100 MG/10ML IV SOLN
INTRAVENOUS | Status: DC | PRN
  Administered 2015-07-02: 30 mg via INTRAVENOUS
  Administered 2015-07-02: 20 mg via INTRAVENOUS

## 2015-07-02 MED ORDER — OXYCODONE HCL 5 MG PO TABS
10.0000 mg | ORAL_TABLET | ORAL | Status: DC | PRN
  Filled 2015-07-02: qty 2

## 2015-07-02 MED ORDER — BUPIVACAINE HCL (PF) 0.5 % IJ SOLN
INTRAMUSCULAR | Status: AC
  Filled 2015-07-02: qty 30

## 2015-07-02 MED ORDER — CELECOXIB 200 MG PO CAPS
200.0000 mg | ORAL_CAPSULE | Freq: Two times a day (BID) | ORAL | Status: DC
  Administered 2015-07-02 – 2015-07-04 (×4): 200 mg via ORAL
  Filled 2015-07-02 (×4): qty 1

## 2015-07-02 MED ORDER — FENTANYL CITRATE (PF) 100 MCG/2ML IJ SOLN
25.0000 ug | INTRAMUSCULAR | Status: DC | PRN
  Administered 2015-07-02 (×5): 25 ug via INTRAVENOUS

## 2015-07-02 SURGICAL SUPPLY — 70 items
BIT DRILL 100X2.5XANTM LCK (BIT) ×1 IMPLANT
BIT DRILL CAL (BIT) ×1 IMPLANT
BIT DRILL SLEEVE MEASURING (BIT) ×1 IMPLANT
BIT DRL 100X2.5XANTM LCK (BIT) ×1
BLADE OSCILLATING/SAGITTAL (BLADE) ×1
BLADE SURG SZ10 CARB STEEL (BLADE) ×4 IMPLANT
BLADE SW THK.38XMED LNG THN (BLADE) ×1 IMPLANT
BNDG COHESIVE 4X5 TAN STRL (GAUZE/BANDAGES/DRESSINGS) ×2 IMPLANT
BNDG ESMARK 6X12 TAN STRL LF (GAUZE/BANDAGES/DRESSINGS) ×2 IMPLANT
BONE CHIP PRESERV 20CC (Bone Implant) ×2 IMPLANT
BRACE KNEE POST OP SHORT (BRACE) ×2 IMPLANT
CANISTER SUCT 1200ML W/VALVE (MISCELLANEOUS) ×2 IMPLANT
COOLER POLAR GLACIER W/PUMP (MISCELLANEOUS) ×2 IMPLANT
DRAPE C-ARM XRAY 36X54 (DRAPES) ×2 IMPLANT
DRAPE INCISE IOBAN 66X45 STRL (DRAPES) ×2 IMPLANT
DRAPE SHEET LG 3/4 BI-LAMINATE (DRAPES) ×4 IMPLANT
DRAPE TABLE BACK 80X90 (DRAPES) ×2 IMPLANT
DRILL BIT 2.5MM (BIT) ×1
DRILL BIT CAL (BIT) ×2
DRILL SLEEVE MEASURING (BIT) ×2
DURAPREP 26ML APPLICATOR (WOUND CARE) ×4 IMPLANT
ELECT REM PT RETURN 9FT ADLT (ELECTROSURGICAL) ×2
ELECTRODE REM PT RTRN 9FT ADLT (ELECTROSURGICAL) ×1 IMPLANT
GAUZE PETRO XEROFOAM 1X8 (MISCELLANEOUS) ×2 IMPLANT
GAUZE SPONGE 4X4 12PLY STRL (GAUZE/BANDAGES/DRESSINGS) ×2 IMPLANT
GAUZE XEROFORM 4X4 STRL (GAUZE/BANDAGES/DRESSINGS) ×2 IMPLANT
GLOVE BIOGEL PI IND STRL 9 (GLOVE) ×1 IMPLANT
GLOVE BIOGEL PI INDICATOR 9 (GLOVE) ×1
GLOVE SURG 9.0 ORTHO LTXF (GLOVE) ×8 IMPLANT
GOWN STRL REUS W/ TWL LRG LVL3 (GOWN DISPOSABLE) ×1 IMPLANT
GOWN STRL REUS W/TWL 2XL LVL3 (GOWN DISPOSABLE) ×2 IMPLANT
GOWN STRL REUS W/TWL LRG LVL3 (GOWN DISPOSABLE) ×1
IMMBOLIZER KNEE 19 BLUE UNIV (SOFTGOODS) IMPLANT
K-WIRE ACE 1.6X6 (WIRE) ×6
KIT RM TURNOVER STRD PROC AR (KITS) ×2 IMPLANT
KWIRE ACE 1.6X6 (WIRE) ×3 IMPLANT
NS IRRIG 1000ML POUR BTL (IV SOLUTION) ×2 IMPLANT
PACK EXTREMITY ARMC (MISCELLANEOUS) ×2 IMPLANT
PAD ABD DERMACEA PRESS 5X9 (GAUZE/BANDAGES/DRESSINGS) ×2 IMPLANT
PAD CAST CTTN 4X4 STRL (SOFTGOODS) ×3 IMPLANT
PAD PREP 24X41 OB/GYN DISP (PERSONAL CARE ITEMS) ×2 IMPLANT
PAD WRAPON POLAR KNEE (MISCELLANEOUS) IMPLANT
PAD WRAPON POLOR MULTI XL (MISCELLANEOUS) ×1 IMPLANT
PADDING CAST COTTON 4X4 STRL (SOFTGOODS) ×3
PLATE LOCK LG 3H LT PROX TIB (Plate) ×2 IMPLANT
SCREW CORT FT 32X3.5XNONLOCK (Screw) ×1 IMPLANT
SCREW CORTICAL 3.5MM  32MM (Screw) ×1 IMPLANT
SCREW CORTICAL 3.5MM 36MM (Screw) ×2 IMPLANT
SCREW LOCK CORT STAR 3.5X50 (Screw) ×2 IMPLANT
SCREW LOCK CORT STAR 3.5X54 (Screw) ×2 IMPLANT
SCREW LOCK CORT STAR 3.5X60 (Screw) ×6 IMPLANT
SCREW LOCK CORT STAR 3.5X65 (Screw) ×6 IMPLANT
SCREW LP 3.5X75MM (Screw) ×2 IMPLANT
SPONGE LAP 18X18 5 PK (GAUZE/BANDAGES/DRESSINGS) ×2 IMPLANT
STAPLER SKIN PROX 35W (STAPLE) ×2 IMPLANT
STOCKINETTE BIAS CUT 4 980044 (GAUZE/BANDAGES/DRESSINGS) IMPLANT
STOCKINETTE BIAS CUT 6 980064 (GAUZE/BANDAGES/DRESSINGS) ×2 IMPLANT
STOCKINETTE IMPERVIOUS 9X36 MD (GAUZE/BANDAGES/DRESSINGS) ×2 IMPLANT
SUT ETHILON 4-0 (SUTURE)
SUT ETHILON 4-0 FS2 18XMFL BLK (SUTURE)
SUT VIC AB 0 CT1 36 (SUTURE) ×4 IMPLANT
SUT VIC AB 2-0 CT1 (SUTURE) ×4 IMPLANT
SUT VIC AB 2-0 CT1 27 (SUTURE)
SUT VIC AB 2-0 CT1 TAPERPNT 27 (SUTURE) IMPLANT
SUT VICRYL+ 3-0 36IN CT-1 (SUTURE) ×2 IMPLANT
SUTURE ETHLN 4-0 FS2 18XMF BLK (SUTURE) IMPLANT
TAPE TRANSPORE STRL 2 31045 (GAUZE/BANDAGES/DRESSINGS) IMPLANT
WRAP-ON POLOR PAD MULTI XL (MISCELLANEOUS) ×1
WRAPON POLAR PAD KNEE (MISCELLANEOUS)
WRAPON POLOR PAD MULTI XL (MISCELLANEOUS) ×1

## 2015-07-02 NOTE — Anesthesia Procedure Notes (Signed)
Procedure Name: Intubation Date/Time: 07/02/2015 3:31 PM Performed by: Omer JackWEATHERLY, Riyana Biel Pre-anesthesia Checklist: Patient identified, Patient being monitored, Timeout performed, Emergency Drugs available and Suction available Patient Re-evaluated:Patient Re-evaluated prior to inductionOxygen Delivery Method: Circle system utilized Preoxygenation: Pre-oxygenation with 100% oxygen Intubation Type: IV induction Ventilation: Mask ventilation without difficulty Laryngoscope Size: Miller and 2 Grade View: Grade I Tube type: Oral Tube size: 7.0 mm Number of attempts: 1 Placement Confirmation: ETT inserted through vocal cords under direct vision,  positive ETCO2 and breath sounds checked- equal and bilateral Secured at: 21 cm Tube secured with: Tape Dental Injury: Teeth and Oropharynx as per pre-operative assessment  Comments: Positioned with intubating pillow

## 2015-07-02 NOTE — ED Notes (Signed)
Reports playing around and felt left knee "pop", reports pain since then.

## 2015-07-02 NOTE — Anesthesia Preprocedure Evaluation (Signed)
Anesthesia Evaluation  Patient identified by MRN, date of birth, ID band Patient awake    Reviewed: Allergy & Precautions, H&P , NPO status , Patient's Chart, lab work & pertinent test results  History of Anesthesia Complications Negative for: history of anesthetic complications  Airway Mallampati: III  TM Distance: >3 FB Neck ROM: full    Dental  (+) Poor Dentition, Chipped   Pulmonary neg shortness of breath, sleep apnea , Current Smoker,    Pulmonary exam normal breath sounds clear to auscultation       Cardiovascular Exercise Tolerance: Good hypertension, (-) angina(-) Past MI and (-) DOE negative cardio ROS Normal cardiovascular exam Rhythm:regular Rate:Normal     Neuro/Psych negative neurological ROS  negative psych ROS   GI/Hepatic negative GI ROS, Neg liver ROS, (+) Cirrhosis     substance abuse  alcohol use, Hepatitis -, C  Endo/Other  diabetes, Type obesity  Renal/GU negative Renal ROS  negative genitourinary   Musculoskeletal   Abdominal   Peds  Hematology negative hematology ROS (+)   Anesthesia Other Findings Past Medical History:   Diabetes mellitus without complication (HCC)                 Hypertension                                                 Hepatitis C, acute                                           Sleep apnea                                                  Bleeding disorder (HCC)                                     History reviewed. No pertinent surgical history.  BMI    Body Mass Index   48.78 kg/m 2      Reproductive/Obstetrics negative OB ROS                             Anesthesia Physical Anesthesia Plan  ASA: III  Anesthesia Plan: General ETT   Post-op Pain Management:    Induction:   Airway Management Planned:   Additional Equipment:   Intra-op Plan:   Post-operative Plan:   Informed Consent: I have reviewed the  patients History and Physical, chart, labs and discussed the procedure including the risks, benefits and alternatives for the proposed anesthesia with the patient or authorized representative who has indicated his/her understanding and acceptance.   Dental Advisory Given  Plan Discussed with: Anesthesiologist, CRNA and Surgeon  Anesthesia Plan Comments:         Anesthesia Quick Evaluation

## 2015-07-02 NOTE — Progress Notes (Signed)
Dr. Martha ClanKrasinski gave me a verbal order for Nicotine patch 22 mg requested by patient.

## 2015-07-02 NOTE — ED Provider Notes (Signed)
[facility] Emergency Department Provider Note  ____________________________________________  Time seen: Approximately 4:14 AM  I have reviewed the triage vital signs and the nursing notes.   HISTORY  Chief Complaint Knee Pain    HPI [name] is a 50 y.o. female with history of obesity, insulin-dependent diabetes, COPD, and sleep apnea who presents with acute onset of severe pain in her left knee.  This occurred just prior to arrival.  She reports that she was "playing around" with a female friend of hers.  She had her left leg planted and then twisted and felt immediate onset of severe pain after feeling a pop.She did not sustain any other injuries.  The pain is isolated to her left knee.  Any amount of movement makes it worse and nothing makes it better.   Past Medical History  Diagnosis Date  . Diabetes mellitus without complication (HCC)   . Hypertension   . Hepatitis C, acute   . Sleep apnea   . Bleeding disorder [facility] [place])     Patient Active Problem List   Diagnosis Date Noted  . Tibial plateau fracture 07/02/2015    History reviewed. No pertinent past surgical history.  No current outpatient prescriptions on file.  Allergies Review of patient's allergies indicates no known allergies.  History reviewed. No pertinent family history.  Social History Social History  Substance Use Topics  . Smoking status: Current Every Day Smoker  . Smokeless tobacco: None  . Alcohol Use: 0.0 oz/week    0 Standard drinks or equivalent per week    Review of Systems Constitutional: No fever/chills Eyes: No visual changes. ENT: No sore throat. Cardiovascular: Denies chest pain. Respiratory: Denies shortness of breath. Gastrointestinal: No abdominal pain.  No nausea, no vomiting.  No diarrhea.  No constipation. Genitourinary: Negative for dysuria. Musculoskeletal: Acute onset severe pain in left knee Skin: Negative for rash. Neurological:  Negative for headaches, focal weakness or numbness.  10-point ROS otherwise negative.  ____________________________________________   PHYSICAL EXAM:  VITAL SIGNS: ED Triage Vitals  Enc Vitals Group     BP 07/02/15 0222 117/74 mmHg     Pulse Rate 07/02/15 0222 86     Resp 07/02/15 0222 20     Temp 07/02/15 0222 97.8 F (36.6 C)     Temp Source 07/02/15 0222 Oral     SpO2 07/02/15 0222 96 %     Weight 07/02/15 0224 254 lb (115.214 kg)     Height 07/02/15 0224  (1.575 m)     Head Cir --      Peak Flow --      Pain Score 07/02/15 0411 10     Pain Loc --      Pain Edu? --      Excl. in GC? --     Constitutional: Alert and oriented. Well appearing and in no acute distress. Eyes: Conjunctivae are normal. PERRL. EOMI. Head: Atraumatic. Nose: No congestion/rhinnorhea. Mouth/Throat: Mucous membranes are moist.  Oropharynx non-erythematous. Neck: No stridor.   Cardiovascular: Normal rate, regular rhythm. Grossly normal heart sounds.  Good peripheral circulation. Respiratory: Normal respiratory effort.  No retractions. Lungs CTAB. Gastrointestinal: Obese.  Soft and nontender. No distention. No abdominal bruits. No CVA tenderness. Musculoskeletal: Pain and swelling just below and including the left knee.  Severely tender to palpation and any attempt at moving the knee and leg.  Neurovascularly intact distal to the injury.  No other abnormalities appreciated. Neurologic:  Normal speech and language. No gross  focal neurologic deficits are appreciated.  Skin:  Skin is warm, dry and intact. No rash noted. Psychiatric: Mood and affect are normal. Speech and behavior are normal.  ____________________________________________   LABS (all labs ordered are listed, but only abnormal results are displayed)  Labs Reviewed  COMPREHENSIVE METABOLIC PANEL - Abnormal; Notable for the following:    Sodium 134 (*)    Chloride 100 (*)    Glucose, Bld 192 (*)    Calcium 8.2 (*)    Total  Protein 8.2 (*)    Albumin 2.9 (*)    AST 81 (*)    ALT 74 (*)    Anion gap 4 (*)    All other components within normal limits  CBC WITH DIFFERENTIAL/PLATELET - Abnormal; Notable for the following:    HCT 47.3 (*)    Platelets 91 (*)    All other components within normal limits  PROTIME-INR - Abnormal; Notable for the following:    Prothrombin Time 16.9 (*)    All other components within normal limits  ETHANOL - Abnormal; Notable for the following:    Alcohol, Ethyl (B) 171 (*)    All other components within normal limits  SURGICAL PCR SCREEN  APTT  TYPE AND SCREEN  ABO/RH   ____________________________________________  EKG  ED ECG REPORT I, Tauriel Scronce, the attending physician, personally viewed and interpreted this ECG.   Date: 07/02/2015  EKG Time: 05:01  Rate: 77  Rhythm: normal sinus rhythm  Axis: Normal  Intervals:Normal  ST&T Change: No evidence of acute ischemia  ____________________________________________  RADIOLOGY   Ct Knee Left Wo Contrast  07/02/2015  CLINICAL DATA:  Felt left knee pop, with pain. Assess tibial plateau fracture. Initial encounter. EXAM: CT OF THE LEFT KNEE WITHOUT CONTRAST TECHNIQUE: Multidetector CT imaging of the left knee was performed according to the standard protocol. Multiplanar CT image reconstructions were also generated. COMPARISON:  Left knee radiographs performed earlier today at 2:35 a.m. FINDINGS: There is a comminuted fracture of the lateral tibial plateau, reflecting a Schatzker type 2, or AO/OTA type B3, fracture. 3-4 mm of depression is noted along the lateral tibial plateau. Fracture lines extend to the tibial spine, including the medial aspect of the tibial spine. Fracture lines extend along the tibial metadiaphysis, without evidence of complete dissociation. There appears to be a nondisplaced fracture of the fibular head. A relatively large lipohemarthrosis is noted. There is mild atrophy of some of the visualized  musculature. No definite vascular abnormalities are seen, though evaluation of the vasculature is limited without contrast. Mild soft tissue injury is noted about the knee. The anterior and posterior cruciate ligaments appear grossly intact. The medial collateral ligament and lateral collateral ligament complex are grossly unremarkable in appearance, though difficult to fully assess. The menisci are not well characterized. The quadriceps and patellar tendons remain intact. IMPRESSION: 1. Comminuted fracture of the lateral tibial plateau, reflecting a Schatzker type 2, or AO/OTA type B3 fracture. 3-4 mm of depression along the lateral tibial plateau. Fracture lines extend to the tibial spine, including the medial aspect of the tibial spine. 2. Nondisplaced fracture of the fibular head. 3. Relatively large lipohemarthrosis noted. Electronically Signed   By: Roanna Raider M.D.   On: 07/02/2015 05:40   Chest Portable 1 View  07/02/2015  CLINICAL DATA:  Preoperative chest radiograph.  Initial encounter. EXAM: PORTABLE CHEST 1 VIEW COMPARISON:  Chest radiograph performed 05/28/2015 FINDINGS: The lungs are well-aerated. Vascular congestion is noted. Chronically increased interstitial markings are  noted. There is no evidence of pleural effusion or pneumothorax. The cardiomediastinal silhouette is borderline normal in size. No acute osseous abnormalities are seen. IMPRESSION: Vascular congestion noted. Chronically increased interstitial markings seen. Electronically Signed   By: Roanna RaiderJeffery  Chang M.D.   On: 07/02/2015 05:30   Dg Knee Complete 4 Views Left  07/02/2015  CLINICAL DATA:  50 year old female with left knee trauma and pain EXAM: LEFT KNEE - COMPLETE 4+ VIEW COMPARISON:  Left knee radiograph dated 04/21/2005 FINDINGS: There is comminuted appearing fracture of the lateral aspect of the proximal tibial metaphysis with extension of the fracture line to the lateral tibial plateau. There is associated mild depression  of the lateral tibial plateau and widening of the lateral knee compartment concerning for underlying ligamentous and/or meniscal injury. No other fracture identified. The bones are osteopenic. There is a suprapatellar joint effusion. There is mild soft tissue swelling of the knee. No radiopaque foreign object. IMPRESSION: Comminuted appearing depressed fracture of the lateral tibial plateau with widening of the lateral knee compartment. Findings concerning for underlying meniscal/ligamentous injury. Orthopedic consult is advised. Electronically Signed   By: Elgie CollardArash  Radparvar M.D.   On: 07/02/2015 02:59    ____________________________________________   PROCEDURES  Procedure(s) performed: None  Critical Care performed: No ____________________________________________   INITIAL IMPRESSION / ASSESSMENT AND PLAN / ED COURSE  Pertinent labs & imaging results that were available during my care of the patient were reviewed by me and considered in my medical decision making (see chart for details).  The patient has a comminuted tibial plateau fracture with widening of the knee compartment.  We are keeping her mobile at this time with an ice pack in place and I am giving morphine and Zofran IV.  I called and spoke with Dr. Martha ClanKrasinski with orthopedics and as of 4:39 AM I am awaiting his callback with management recommendations.  Of note, at rest and in no acute distress her oxygen saturation was 85% with a good waveform.  She does have COPD and sleep apnea as well as morbid obesity.  She was placed on 2 L of oxygen by nasal cannula.  ----------------------------------------- 4:58 AM on 07/02/2015 -----------------------------------------  I spoke by phone with Dr. Martha ClanKrasinski who will admit.  He has requested internal medicine consult.  Placing knee immobilizer per his request.  ____________________________________________  FINAL CLINICAL IMPRESSION(S) / ED DIAGNOSES  Final diagnoses:  Tibial  plateau fracture, left, closed, initial encounter      NEW MEDICATIONS STARTED DURING THIS VISIT:  Current Discharge Medication List        Note:  This document was prepared using Dragon voice recognition software and may include unintentional dictation errors.   Loleta Roseory Nazario Russom, MD 07/02/15 [phone]

## 2015-07-02 NOTE — ED Notes (Signed)
Pharmacy called, told [facility] [place] is pt's pharmacy.  Day shift pharm tech to follow up to complete med rec

## 2015-07-02 NOTE — Consult Note (Addendum)
[facility]  Medical Consultation  Lucianne Musseresa A Goines ZOX:[id number] DOB: [birth year] DOA: 07/02/2015 PCP: Hyman HopesBurns, Harriett P, MD   Requesting physician: Martha ClanKrasinski MD Date of consultation: 07/02/2015 Reason for consultation: Preop clearance  CHIEF COMPLAINT:   Chief Complaint  Patient presents with  . Knee Pain    HISTORY OF PRESENT ILLNESS: [name]  is a 50 y.o. female with a known history of COPD, sleep apnea, diabetes mellitus, hypertension, hyperlipidemia had a fall yesterday around 11 PM the night. She twisted her left knee and lost balance and fell down.patient felt her knee popped before she landed on the floor. Patient has aching pain in the left knee was 6 out of 10. She was given IV pain medication in the emergency room and it relieved the pain. Patient was admitted by orthopedic service and hospitalist service was consulted for clearance. No history of any chest pain. No history of any shortness of breath. No fever or chills or cough. No history of head injury or loss of consciousness. Does not remember the home medication list. Currently has a left knee immobilizer and was seen by orthopedic attending and emergency room.  PAST MEDICAL HISTORY:   Past Medical History  Diagnosis Date  . Diabetes mellitus without complication (HCC)   . Hypertension   . Hepatitis C, acute   . Sleep apnea   . Bleeding disorder (HCC)     PAST SURGICAL HISTORY: History reviewed. No pertinent past surgical history.  Cholecystectomy Fibroid surgery  SOCIAL HISTORY:  Social History  Substance Use Topics  . Smoking status: Current Every Day Smoker  . Smokeless tobacco: Not on file  . Alcohol Use: No  Drinks alcohol  FAMILY HISTORY: History reviewed. No pertinent family history.  DRUG ALLERGIES: No Known Allergies  REVIEW OF SYSTEMS:   CONSTITUTIONAL: No fever, fatigue or weakness.  EYES: No blurred or double vision.  EARS, NOSE, AND THROAT: No tinnitus or ear pain.  RESPIRATORY:  No cough, shortness of breath, wheezing or hemoptysis.  CARDIOVASCULAR: No chest pain, orthopnea, edema.  GASTROINTESTINAL: No nausea, vomiting, diarrhea or abdominal pain.  GENITOURINARY: No dysuria, hematuria.  ENDOCRINE: No polyuria, nocturia,  HEMATOLOGY: No anemia SKIN: No rash or lesion. MUSCULOSKELETAL: left knee pain present  NEUROLOGIC: No tingling, numbness, weakness.  PSYCHIATRY: No anxiety or depression.   MEDICATIONS AT HOME:  Prior to Admission medications   Medication Sig Start Date End Date Taking? Authorizing Provider  HYDROcodone-acetaminophen (NORCO/VICODIN) 5-325 MG per tablet Take 1 tablet by mouth every 4 (four) hours as needed for moderate pain. 11/24/14   Tommi Rumpshonda L Summers, PA-C  ibuprofen (ADVIL,MOTRIN) 800 MG tablet Take 1 tablet (800 mg total) by mouth 3 (three) times daily. 11/24/14   Tommi Rumpshonda L Summers, PA-C      PHYSICAL EXAMINATION:   VITAL SIGNS: Blood pressure 138/86, pulse 84, temperature 97.4 F (36.3 C), temperature source Oral, resp. rate 18, height 5\' 2"  (1.575 m), weight 121.02 kg (266 lb 12.8 oz), SpO2 90 %.  GENERAL:  50 y.o.-year-old patient lying in the bed with no acute distress.  EYES: Pupils equal, round, reactive to light and accommodation. No scleral icterus. Extraocular muscles intact.  HEENT: Head atraumatic, normocephalic. Oropharynx and nasopharynx clear.  NECK:  Supple, no jugular venous distention. No thyroid enlargement, no tenderness.  LUNGS: Normal breath sounds bilaterally, no wheezing, rales,rhonchi or crepitation. No use of accessory muscles of respiration.  CARDIOVASCULAR: S1, S2 normal. No murmurs, rubs, or gallops.  ABDOMEN: Soft, nontender, nondistended. Bowel sounds present. No  organomegaly or mass.  EXTREMITIES: No pedal edema, cyanosis, or clubbing. Left knee immobilizer present. NEUROLOGIC: Cranial nerves II through XII are intact. Muscle strength 5/5 in all extremities. Sensation intact. Gait not checked.  PSYCHIATRIC:  The patient is alert and oriented x 3.  SKIN: No obvious rash, lesion, or ulcer.   LABORATORY PANEL:   CBC  Recent Labs Lab 07/02/15 0427  WBC 6.1  HGB 15.8  HCT 47.3*  PLT 91*  MCV 98.9  MCH 33.1  MCHC 33.4  RDW 14.5  LYMPHSABS 1.7  MONOABS 0.6  EOSABS 0.1  BASOSABS 0.1   ------------------------------------------------------------------------------------------------------------------  Chemistries   Recent Labs Lab 07/02/15 0427  NA 134*  K 3.6  CL 100*  CO2 30  GLUCOSE 192*  BUN 16  CREATININE 0.86  CALCIUM 8.2*  AST 81*  ALT 74*  ALKPHOS 114  BILITOT 1.1   ------------------------------------------------------------------------------------------------------------------ estimated creatinine clearance is 97 mL/min (by C-G formula based on Cr of 0.86). ------------------------------------------------------------------------------------------------------------------ No results for input(s): TSH, T4TOTAL, T3FREE, THYROIDAB in the last 72 hours.  Invalid input(s): FREET3   Coagulation profile  Recent Labs Lab 07/02/15 0427  INR 1.36   ------------------------------------------------------------------------------------------------------------------- No results for input(s): DDIMER in the last 72 hours. -------------------------------------------------------------------------------------------------------------------  Cardiac Enzymes No results for input(s): CKMB, TROPONINI, MYOGLOBIN in the last 168 hours.  Invalid input(s): CK ------------------------------------------------------------------------------------------------------------------ Invalid input(s): POCBNP  ---------------------------------------------------------------------------------------------------------------  Urinalysis    Component Value Date/Time   COLORURINE Amber 01/31/2013 2039   APPEARANCEUR Hazy 01/31/2013 2039   LABSPEC 1.017 01/31/2013 2039   PHURINE 5.0 01/31/2013  2039   GLUCOSEU Negative 01/31/2013 2039   HGBUR 2+ 01/31/2013 2039   BILIRUBINUR Negative 01/31/2013 2039   KETONESUR Negative 01/31/2013 2039   PROTEINUR Negative 01/31/2013 2039   NITRITE Positive 01/31/2013 2039   LEUKOCYTESUR Trace 01/31/2013 2039     RADIOLOGY: Ct Knee Left Wo Contrast  07/02/2015  CLINICAL DATA:  Felt left knee pop, with pain. Assess tibial plateau fracture. Initial encounter. EXAM: CT OF THE LEFT KNEE WITHOUT CONTRAST TECHNIQUE: Multidetector CT imaging of the left knee was performed according to the standard protocol. Multiplanar CT image reconstructions were also generated. COMPARISON:  Left knee radiographs performed earlier today at 2:35 a.m. FINDINGS: There is a comminuted fracture of the lateral tibial plateau, reflecting a Schatzker type 2, or AO/OTA type B3, fracture. 3-4 mm of depression is noted along the lateral tibial plateau. Fracture lines extend to the tibial spine, including the medial aspect of the tibial spine. Fracture lines extend along the tibial metadiaphysis, without evidence of complete dissociation. There appears to be a nondisplaced fracture of the fibular head. A relatively large lipohemarthrosis is noted. There is mild atrophy of some of the visualized musculature. No definite vascular abnormalities are seen, though evaluation of the vasculature is limited without contrast. Mild soft tissue injury is noted about the knee. The anterior and posterior cruciate ligaments appear grossly intact. The medial collateral ligament and lateral collateral ligament complex are grossly unremarkable in appearance, though difficult to fully assess. The menisci are not well characterized. The quadriceps and patellar tendons remain intact. IMPRESSION: 1. Comminuted fracture of the lateral tibial plateau, reflecting a Schatzker type 2, or AO/OTA type B3 fracture. 3-4 mm of depression along the lateral tibial plateau. Fracture lines extend to the tibial spine, including  the medial aspect of the tibial spine. 2. Nondisplaced fracture of the fibular head. 3. Relatively large lipohemarthrosis noted. Electronically Signed   By: Roanna Raider M.D.   On:  07/02/2015 05:40   Chest Portable 1 View  07/02/2015  CLINICAL DATA:  Preoperative chest radiograph.  Initial encounter. EXAM: PORTABLE CHEST 1 VIEW COMPARISON:  Chest radiograph performed 05/28/2015 FINDINGS: The lungs are well-aerated. Vascular congestion is noted. Chronically increased interstitial markings are noted. There is no evidence of pleural effusion or pneumothorax. The cardiomediastinal silhouette is borderline normal in size. No acute osseous abnormalities are seen. IMPRESSION: Vascular congestion noted. Chronically increased interstitial markings seen. Electronically Signed   By: Roanna Raider M.D.   On: 07/02/2015 05:30   Dg Knee Complete 4 Views Left  07/02/2015  CLINICAL DATA:  50 year old female with left knee trauma and pain EXAM: LEFT KNEE - COMPLETE 4+ VIEW COMPARISON:  Left knee radiograph dated 04/21/2005 FINDINGS: There is comminuted appearing fracture of the lateral aspect of the proximal tibial metaphysis with extension of the fracture line to the lateral tibial plateau. There is associated mild depression of the lateral tibial plateau and widening of the lateral knee compartment concerning for underlying ligamentous and/or meniscal injury. No other fracture identified. The bones are osteopenic. There is a suprapatellar joint effusion. There is mild soft tissue swelling of the knee. No radiopaque foreign object. IMPRESSION: Comminuted appearing depressed fracture of the lateral tibial plateau with widening of the lateral knee compartment. Findings concerning for underlying meniscal/ligamentous injury. Orthopedic consult is advised. Electronically Signed   By: Elgie Collard M.D.   On: 07/02/2015 02:59    EKG: Orders placed or performed during the hospital encounter of 07/02/15  . EKG 12-Lead  .  EKG 12-Lead  . EKG 12-Lead  . EKG 12-Lead    IMPRESSION AND PLAN: 50 year old obese female patient with history of COPD, sleep apnea, hypertension, diabetes mellitus, hyperlipidemia sustained a fall. She landed on her left knee and a presented to the emergency room. Assessment 1. Left tibial plateau fracture 2. Left fibular fracture 3. Accidental fall 4. Emphysema 5. Hypertension 6. Type 2 diabetes mellitus 7. Alcohol intoxication Treatment plan Obtain home medication list Start sliding scale coverage for diabetes mellitus Avoid blood thinner medication Continue oxygen via nasal cannula Moderate risk candidate going for low risk surgery. Orthopedic service to go ahead for surgery. Monitor hemoglobin and hematocrit Follow blood alcohol level and [place] protocol  All the records are reviewed and case discussed with ED provider. Management plans discussed with the patient, family and they are in agreement.  CODE STATUS:FULL    Code Status Orders        Start     Ordered   07/02/15 0444  Full code   Continuous     07/02/15 0452    Code Status History    Date Active Date Inactive Code Status Order ID Comments User Context   This patient has a current code status but no historical code status.       TOTAL TIME TAKING CARE OF THIS PATIENT: 45 minutes.    Ihor Austin M.D on 07/02/2015 at 6:22 AM  Between 7am to 6pm - Pager - [phone]  After 6pm go to [email or web address] - password [email or web address] [facility]  [facility]  Office  [phone]  CC: Primary care physician; Hyman Hopes, MD

## 2015-07-02 NOTE — Progress Notes (Signed)
Subjective:  POST-OP CHECK:  Patient recently arrived back to her room. Her family is at the bedside. Patient reports left knee and leg pain as marked.  The PACU nurses noted transient airway obstruction consistent with sleep apnea when she does off after receiving pain medication. She explains that the patient has used a CPAP in the past but the machine at home is no longer working.  Objective:   VITALS:   Filed Vitals:   07/02/15 1832 07/02/15 1845 07/02/15 1855 07/02/15 1900  BP: 150/90   134/86  Pulse: 98 100 96 99  Temp:      TempSrc:      Resp:  48 10 24  Height:      Weight:      SpO2: 94% 93% 93% 92%    PHYSICAL EXAM:  Patient is awake and following commands. She is in no acute distress. She has intact sensation light touch in the left foot. She can flex and extend her toes and dorsiflex and plantarflex her ankle. She has palpable pedal pulses.   LABS  Results for orders placed or performed during the hospital encounter of 07/02/15 (from the past 24 hour(s))  Comprehensive metabolic panel     Status: Abnormal   Collection Time: 07/02/15  4:27 AM  Result Value Ref Range   Sodium 134 (L) 135 - 145 mmol/L   Potassium 3.6 3.5 - 5.1 mmol/L   Chloride 100 (L) 101 - 111 mmol/L   CO2 30 22 - 32 mmol/L   Glucose, Bld 192 (H) 65 - 99 mg/dL   BUN 16 6 - 20 mg/dL   Creatinine, Ser 1.610.86 0.44 - 1.00 mg/dL   Calcium 8.2 (L) 8.9 - 10.3 mg/dL   Total Protein 8.2 (H) 6.5 - 8.1 g/dL   Albumin 2.9 (L) 3.5 - 5.0 g/dL   AST 81 (H) 15 - 41 U/L   ALT 74 (H) 14 - 54 U/L   Alkaline Phosphatase 114 38 - 126 U/L   Total Bilirubin 1.1 0.3 - 1.2 mg/dL   GFR calc non Af Amer >60 >60 mL/min   GFR calc Af Amer >60 >60 mL/min   Anion gap 4 (L) 5 - 15  CBC with Differential/Platelet     Status: Abnormal   Collection Time: 07/02/15  4:27 AM  Result Value Ref Range   WBC 6.1 3.6 - 11.0 K/uL   RBC 4.78 3.80 - 5.20 MIL/uL   Hemoglobin 15.8 12.0 - 16.0 g/dL   HCT 09.647.3 (H) 04.535.0 - 40.947.0 %   MCV  98.9 80.0 - 100.0 fL   MCH 33.1 26.0 - 34.0 pg   MCHC 33.4 32.0 - 36.0 g/dL   RDW 81.114.5 91.411.5 - 78.214.5 %   Platelets 91 (L) 150 - 440 K/uL   Neutrophils Relative % 60% %   Neutro Abs 3.7 1.4 - 6.5 K/uL   Lymphocytes Relative 28% %   Lymphs Abs 1.7 1.0 - 3.6 K/uL   Monocytes Relative 10% %   Monocytes Absolute 0.6 0.2 - 0.9 K/uL   Eosinophils Relative 1% %   Eosinophils Absolute 0.1 0 - 0.7 K/uL   Basophils Relative 1% %   Basophils Absolute 0.1 0 - 0.1 K/uL  APTT     Status: None   Collection Time: 07/02/15  4:27 AM  Result Value Ref Range   aPTT 35 24 - 36 seconds  Protime-INR     Status: Abnormal   Collection Time: 07/02/15  4:27 AM  Result Value  Ref Range   Prothrombin Time 16.9 (H) 11.4 - 15.0 seconds   INR 1.36   Ethanol     Status: Abnormal   Collection Time: 07/02/15  4:27 AM  Result Value Ref Range   Alcohol, Ethyl (B) 171 (H) <5 mg/dL  Type and screen If not already done in ED     Status: None   Collection Time: 07/02/15  4:56 AM  Result Value Ref Range   ABO/RH(D) A POS    Antibody Screen NEG    Sample Expiration 07/05/2015   ABO/Rh     Status: None   Collection Time: 07/02/15  4:57 AM  Result Value Ref Range   ABO/RH(D) A POS   Surgical pcr screen     Status: None   Collection Time: 07/02/15  5:57 AM  Result Value Ref Range   MRSA, PCR NEGATIVE NEGATIVE   Staphylococcus aureus NEGATIVE NEGATIVE  Glucose, capillary     Status: Abnormal   Collection Time: 07/02/15  9:34 AM  Result Value Ref Range   Glucose-Capillary 170 (H) 65 - 99 mg/dL   Comment 1 Notify RN     Dg Tibia/fibula Left  07/02/2015  CLINICAL DATA:  ORIF of left tibia. EXAM: LEFT TIBIA AND FIBULA - 2 VIEW COMPARISON:  07/02/2015 FINDINGS: There has been interval open reduction and internal fixation of the proximal left tibial plateau fracture. The hardware components and fracture fragments are in anatomic alignment. IMPRESSION: 1. Status post ORIF of proximal tibial fracture. Electronically Signed    By: Signa Kell M.D.   On: 07/02/2015 17:33   Ct Knee Left Wo Contrast  07/02/2015  CLINICAL DATA:  Felt left knee pop, with pain. Assess tibial plateau fracture. Initial encounter. EXAM: CT OF THE LEFT KNEE WITHOUT CONTRAST TECHNIQUE: Multidetector CT imaging of the left knee was performed according to the standard protocol. Multiplanar CT image reconstructions were also generated. COMPARISON:  Left knee radiographs performed earlier today at 2:35 a.m. FINDINGS: There is a comminuted fracture of the lateral tibial plateau, reflecting a Schatzker type 2, or AO/OTA type B3, fracture. 3-4 mm of depression is noted along the lateral tibial plateau. Fracture lines extend to the tibial spine, including the medial aspect of the tibial spine. Fracture lines extend along the tibial metadiaphysis, without evidence of complete dissociation. There appears to be a nondisplaced fracture of the fibular head. A relatively large lipohemarthrosis is noted. There is mild atrophy of some of the visualized musculature. No definite vascular abnormalities are seen, though evaluation of the vasculature is limited without contrast. Mild soft tissue injury is noted about the knee. The anterior and posterior cruciate ligaments appear grossly intact. The medial collateral ligament and lateral collateral ligament complex are grossly unremarkable in appearance, though difficult to fully assess. The menisci are not well characterized. The quadriceps and patellar tendons remain intact. IMPRESSION: 1. Comminuted fracture of the lateral tibial plateau, reflecting a Schatzker type 2, or AO/OTA type B3 fracture. 3-4 mm of depression along the lateral tibial plateau. Fracture lines extend to the tibial spine, including the medial aspect of the tibial spine. 2. Nondisplaced fracture of the fibular head. 3. Relatively large lipohemarthrosis noted. Electronically Signed   By: Roanna Raider M.D.   On: 07/02/2015 05:40   Ct 3d Independent  Annabell Sabal  07/02/2015  CLINICAL DATA:  Nonspecific (abnormal) findings on radiological and other examination of musculoskeletal sysem. EXAM: 3-DIMENSIONAL CT IMAGE RENDERING ON INDEPENDENT WORKSTATION TECHNIQUE: 3-dimensional CT images were rendered by post-processing of the  original CT data on an independent workstation. The 3-dimensional CT images were interpreted and findings were reported in the accompanying complete CT report for this study COMPARISON:  CT scan, same date. FINDINGS: 3D images were performed at the independent workstation to help and preoperative assessment. There is a mildly depressed die punch type lateral tibial plateau fracture with impaction and comminution. Please see 3D images. IMPRESSION: Comminuted and mildly depressed lateral tibial plateau fracture. Electronically Signed   By: Rudie Meyer M.D.   On: 07/02/2015 13:54   Chest Portable 1 View  07/02/2015  CLINICAL DATA:  Preoperative chest radiograph.  Initial encounter. EXAM: PORTABLE CHEST 1 VIEW COMPARISON:  Chest radiograph performed 05/28/2015 FINDINGS: The lungs are well-aerated. Vascular congestion is noted. Chronically increased interstitial markings are noted. There is no evidence of pleural effusion or pneumothorax. The cardiomediastinal silhouette is borderline normal in size. No acute osseous abnormalities are seen. IMPRESSION: Vascular congestion noted. Chronically increased interstitial markings seen. Electronically Signed   By: Roanna Raider M.D.   On: 07/02/2015 05:30   Dg Knee Complete 4 Views Left  07/02/2015  CLINICAL DATA:  50 year old female with left knee trauma and pain EXAM: LEFT KNEE - COMPLETE 4+ VIEW COMPARISON:  Left knee radiograph dated 04/21/2005 FINDINGS: There is comminuted appearing fracture of the lateral aspect of the proximal tibial metaphysis with extension of the fracture line to the lateral tibial plateau. There is associated mild depression of the lateral tibial plateau and widening of the  lateral knee compartment concerning for underlying ligamentous and/or meniscal injury. No other fracture identified. The bones are osteopenic. There is a suprapatellar joint effusion. There is mild soft tissue swelling of the knee. No radiopaque foreign object. IMPRESSION: Comminuted appearing depressed fracture of the lateral tibial plateau with widening of the lateral knee compartment. Findings concerning for underlying meniscal/ligamentous injury. Orthopedic consult is advised. Electronically Signed   By: Elgie Collard M.D.   On: 07/02/2015 02:59   Dg Knee Left Port  07/02/2015  CLINICAL DATA:  Left tibial plateau fracture. Postop. Initial encounter. EXAM: PORTABLE LEFT KNEE - 1-2 VIEW COMPARISON:  07/02/2015 FINDINGS: Lateral fixation plate and screws are seen across the tibial metaphysis. Fracture fragments are in near anatomic alignment. Skin staples and external knee brace are seen in place which limits fine bone detail. IMPRESSION: Internal fixation of tibial plateau fractures in near anatomic alignment. Electronically Signed   By: Myles Rosenthal M.D.   On: 07/02/2015 19:24   Dg C-arm 61-120 Min  07/02/2015  CLINICAL DATA:  ORIF of left tib fib EXAM: DG C-ARM 61-120 MIN COMPARISON:  Earlier today FINDINGS: Sideplate and screw device reduces the proximal tibial plateau fracture. The hardware components and fracture fragments are in anatomic alignment. IMPRESSION: 1. Status post ORIF of proximal tibial plateau fracture. Electronically Signed   By: Signa Kell M.D.   On: 07/02/2015 17:34    Assessment/Plan: Day of Surgery   Active Problems:   Tibial plateau fracture  Patient is stable postop. She is having pain. She was written for IV Dilaudid, oxycodone and tramadol for pain management. She will continue to elevate and ice the left knee with a Polar Care. I'm ordering a respiratory therapy consult for possible CPAP. Patient will complete 24 hours postop antibiotics. Her Foley catheter will be  removed in the morning. She has foot pumps ordered and will begin Lovenox tomorrow for daily prophylaxis. She'll be encouraged to use incentive spirometry while awake. Patient and her family understood and agreed with  this plan. I'll see the patient again tomorrow.    Juanell Fairly , MD 07/02/2015, 7:59 PM

## 2015-07-02 NOTE — Anesthesia Postprocedure Evaluation (Signed)
Anesthesia Post Note  Patient: [name]  Procedure(s) Performed: Procedure(s) (LRB): OPEN REDUCTION INTERNAL FIXATION (ORIF) TIBIAL PLATEAU (Left)  Patient location during evaluation: PACU Anesthesia Type: General Level of consciousness: awake and alert Pain management: pain level controlled Vital Signs Assessment: post-procedure vital signs reviewed and stable Respiratory status: spontaneous breathing, nonlabored ventilation, respiratory function stable and patient connected to nasal cannula oxygen Cardiovascular status: blood pressure returned to baseline and stable Postop Assessment: no signs of nausea or vomiting Anesthetic complications: no    Last Vitals:  Filed Vitals:   07/02/15 1855 07/02/15 1900  BP:  134/86  Pulse: 96 99  Temp:    Resp: 10 24    Last Pain:  Filed Vitals:   07/02/15 1915  PainSc: 6                  Cleda MccreedyJoseph K Graciella Arment

## 2015-07-02 NOTE — Progress Notes (Signed)
Patient arrived to unit from ED. Patient alert and oriented. Patient restless. Patient denies pain. IV fluids infusing, foley intact, and knee immbolizer. Skin CDI. Surgical MRSA performed awaiting results.

## 2015-07-02 NOTE — Progress Notes (Signed)
Pt initiated on auto cpap with full face mask which she uses at home.  O2 bled in at 2 lpm & O2 sat is 100. Tol well and sleeping with it on.

## 2015-07-02 NOTE — Progress Notes (Signed)
[facility] [place] [facility]   PATIENT NAME: Brittany Loweresa Zeiser    MR#:  [id number]  DATE OF BIRTH:  09-14-1965  SUBJECTIVE:  CHIEF COMPLAINT:   Chief Complaint  Patient presents with  . Knee Pain   patient drowsy after receiving Dilaudid for her knee pain.  REVIEW OF SYSTEMS:    Review of Systems  Unable to perform ROS: mental status change    DRUG ALLERGIES:  No Known Allergies  VITALS:  Blood pressure 136/83, pulse 91, temperature 97.5 F (36.4 C), temperature source Oral, resp. rate 16, height 5\' 2"  (1.575 m), weight 121.02 kg (266 lb 12.8 oz), SpO2 98 %.  PHYSICAL EXAMINATION:   Physical Exam  GENERAL:  50 y.o.-year-old patient lying in the bed with no acute distress. Obese EYES: Pupils equal, round, reactive to light and accommodation. No scleral icterus. Extraocular muscles intact.  HEENT: Head atraumatic, normocephalic. Oropharynx and nasopharynx clear.  NECK:  Supple, no jugular venous distention. No thyroid enlargement, no tenderness.  LUNGS: Normal breath sounds bilaterally, no wheezing, rales, rhonchi. No use of accessory muscles of respiration.  CARDIOVASCULAR: S1, S2 normal. No murmurs, rubs, or gallops.  ABDOMEN: Soft, nontender, nondistended. Bowel sounds present. No organomegaly or mass.  EXTREMITIES: No cyanosis, clubbing or edema b/l.   Cast on left knee NEUROLOGIC: Moves all 4 extremities symmetrically PSYCHIATRIC: The patient is drowsy SKIN: No obvious rash, lesion, or ulcer.   LABORATORY PANEL:   CBC  Recent Labs Lab 07/02/15 0427  WBC 6.1  HGB 15.8  HCT 47.3*  PLT 91*   ------------------------------------------------------------------------------------------------------------------ Chemistries   Recent Labs Lab 07/02/15 0427  NA 134*  K 3.6  CL 100*  CO2 30  GLUCOSE 192*  BUN 16  CREATININE 0.86  CALCIUM 8.2*  AST 81*  ALT 74*  ALKPHOS 114  BILITOT 1.1    ------------------------------------------------------------------------------------------------------------------  Cardiac Enzymes No results for input(s): TROPONINI in the last 168 hours. ------------------------------------------------------------------------------------------------------------------  RADIOLOGY:  Ct Knee Left Wo Contrast  07/02/2015  CLINICAL DATA:  Felt left knee pop, with pain. Assess tibial plateau fracture. Initial encounter. EXAM: CT OF THE LEFT KNEE WITHOUT CONTRAST TECHNIQUE: Multidetector CT imaging of the left knee was performed according to the standard protocol. Multiplanar CT image reconstructions were also generated. COMPARISON:  Left knee radiographs performed earlier today at 2:35 a.m. FINDINGS: There is a comminuted fracture of the lateral tibial plateau, reflecting a Schatzker type 2, or AO/OTA type B3, fracture. 3-4 mm of depression is noted along the lateral tibial plateau. Fracture lines extend to the tibial spine, including the medial aspect of the tibial spine. Fracture lines extend along the tibial metadiaphysis, without evidence of complete dissociation. There appears to be a nondisplaced fracture of the fibular head. A relatively large lipohemarthrosis is noted. There is mild atrophy of some of the visualized musculature. No definite vascular abnormalities are seen, though evaluation of the vasculature is limited without contrast. Mild soft tissue injury is noted about the knee. The anterior and posterior cruciate ligaments appear grossly intact. The medial collateral ligament and lateral collateral ligament complex are grossly unremarkable in appearance, though difficult to fully assess. The menisci are not well characterized. The quadriceps and patellar tendons remain intact. IMPRESSION: 1. Comminuted fracture of the lateral tibial plateau, reflecting a Schatzker type 2, or AO/OTA type B3 fracture. 3-4 mm of depression along the lateral tibial plateau.  Fracture lines extend to the tibial spine, including the medial aspect of the tibial spine. 2. Nondisplaced  fracture of the fibular head. 3. Relatively large lipohemarthrosis noted. Electronically Signed   By: Roanna Raider M.D.   On: 07/02/2015 05:40   Chest Portable 1 View  07/02/2015  CLINICAL DATA:  Preoperative chest radiograph.  Initial encounter. EXAM: PORTABLE CHEST 1 VIEW COMPARISON:  Chest radiograph performed 05/28/2015 FINDINGS: The lungs are well-aerated. Vascular congestion is noted. Chronically increased interstitial markings are noted. There is no evidence of pleural effusion or pneumothorax. The cardiomediastinal silhouette is borderline normal in size. No acute osseous abnormalities are seen. IMPRESSION: Vascular congestion noted. Chronically increased interstitial markings seen. Electronically Signed   By: Roanna Raider M.D.   On: 07/02/2015 05:30   Dg Knee Complete 4 Views Left  07/02/2015  CLINICAL DATA:  50 year old female with left knee trauma and pain EXAM: LEFT KNEE - COMPLETE 4+ VIEW COMPARISON:  Left knee radiograph dated 04/21/2005 FINDINGS: There is comminuted appearing fracture of the lateral aspect of the proximal tibial metaphysis with extension of the fracture line to the lateral tibial plateau. There is associated mild depression of the lateral tibial plateau and widening of the lateral knee compartment concerning for underlying ligamentous and/or meniscal injury. No other fracture identified. The bones are osteopenic. There is a suprapatellar joint effusion. There is mild soft tissue swelling of the knee. No radiopaque foreign object. IMPRESSION: Comminuted appearing depressed fracture of the lateral tibial plateau with widening of the lateral knee compartment. Findings concerning for underlying meniscal/ligamentous injury. Orthopedic consult is advised. Electronically Signed   By: Elgie Collard M.D.   On: 07/02/2015 02:59     ASSESSMENT AND PLAN:   1. Left tibial  plateau fracture 2. Left fibular fracture 3. Accidental fall 4. Emphysema 5. Hypertension 6. Type 2 diabetes mellitus 7. Alcohol intoxication  - Discussed with Dr. Martha Clan. Surgery later today. - Continue home medications for diabetes and hypertension. - IV when necessary antihypertensives - Sliding-scale insulin  All the records are reviewed and case discussed with Care Management/Social Workerr. Management plans discussed with the patient, family and they are in agreement.  DVT Prophylaxis: SCDs  TOTAL TIME TAKING CARE OF THIS PATIENT: 20 minutes.    Milagros Loll R M.D on 07/02/2015 at 1:21 PM  Between 7am to 6pm - Pager - [phone]  After 6pm go to [email or web address] - password [email or web address]  [facility]  Office  [phone]  CC: Primary care physician; Hyman Hopes, MD  Note: This dictation was prepared with Dragon dictation along with smaller phrase technology. Any transcriptional errors that result from this process are unintentional.

## 2015-07-02 NOTE — Progress Notes (Signed)
Pt has a hx of sleep apnea. Placed pt on 2L O2. Pt is a mouth breather while sleeping. Pt stated that she has a CPAP at home but has left it there. Called MD. Dr. Elisabeth PigeonVachhani place new order. Respiratory therapy notified.

## 2015-07-02 NOTE — Transfer of Care (Signed)
Immediate Anesthesia Transfer of Care Note  Patient: [name]  Procedure(s) Performed: Procedure(s): OPEN REDUCTION INTERNAL FIXATION (ORIF) TIBIAL PLATEAU (Left)  Patient Location: PACU  Anesthesia Type:General  Level of Consciousness: patient cooperative and lethargic  Airway & Oxygen Therapy: Patient Spontanous Breathing and Patient connected to face mask oxygen  Post-op Assessment: Report given to RN and Post -op Vital signs reviewed and stable  Post vital signs: Reviewed and stable  Last Vitals:  Filed Vitals:   07/02/15 0822 07/02/15 1807  BP: 136/83 137/80  Pulse: 91   Temp: 36.4 C 37.5 C  Resp: 16 23    Complications: No apparent anesthesia complications

## 2015-07-02 NOTE — OR Nursing (Signed)
Patient had a duo neb resp. Reg

## 2015-07-02 NOTE — H&P (Signed)
PREOPERATIVE H&P  Chief Complaint: left tibial plateau fracture  HPI: [name] is a 50 y.o. female who presents with a diagnosis of a left tibial plateau fracture s/p fall overnight while wrestling with a family member. She explains pain and an inability to bear weight following the injury. Patient has approximately 3-4 mm of depression of the comminuted lateral tibial plateau fracture.  This is affecting her ability to bear weight on the left lower extremity.  I have recommended surgical management for her displaced fracture. Patient is currently drowsy but arousable. She will answer questions and follow commands appropriately. She was seen with her nurse, Reeves Dam, today.  Past Medical History  Diagnosis Date  . Diabetes mellitus without complication (HCC)   . Hypertension   . Hepatitis C, acute   . Sleep apnea   . Bleeding disorder (HCC)    History reviewed. No pertinent past surgical history. Social History   Social History  . Marital Status: Married    Spouse Name: N/A  . Number of Children: N/A  . Years of Education: N/A   Social History Main Topics  . Smoking status: Current Every Day Smoker  . Smokeless tobacco: None  . Alcohol Use: 0.0 oz/week    0 Standard drinks or equivalent per week  . Drug Use: None  . Sexual Activity: Not Asked   Other Topics Concern  . None   Social History Narrative   History reviewed. No pertinent family history. No Known Allergies Prior to Admission medications   Medication Sig Start Date End Date Taking? Authorizing Provider  HYDROcodone-acetaminophen (NORCO/VICODIN) 5-325 MG per tablet Take 1 tablet by mouth every 4 (four) hours as needed for moderate pain. 11/24/14   Tommi Rumps, PA-C  ibuprofen (ADVIL,MOTRIN) 800 MG tablet Take 1 tablet (800 mg total) by mouth 3 (three) times daily. 11/24/14   Tommi Rumps, PA-C     Positive ROS: All other systems have been reviewed and were otherwise negative with the exception of  those mentioned in the HPI and as above.  Physical Exam: General: Alert, no acute distress,  Cardiovascular: Regular rate and rhythm, no murmurs rubs or gallops.  No pedal edema Respiratory: Clear to auscultation bilaterally, no wheezes rales or rhonchi. No cyanosis, no use of accessory musculature GI: No organomegaly, abdomen is soft and non-tender nondistended with positive bowel sounds. Skin: Skin intact, no lesions within the operative field. Neurologic: Sensation intact distally Lymphatic: No cervical lymphadenopathy  MUSCULOSKELETAL: Left lower extremity: Patient's leg has mild swelling with ecchymosis. Her thigh and leg compartments are soft and compressible. She has tenderness over the lateral tibial condyle. She has palpable pedal pulses and intact sensation throughout the left lower extremity. She has intact function distally. Knee range of motion is severely limited due to pain. There is no obvious deformity or angulation to the left lower extremity. She is currently in a knee immobilizer. Knee immobilizer was opened for examination purposes.  Assessment: left tibial plateau fracture  Plan: Plan for Procedure(s): OPEN REDUCTION INTERNAL FIXATION (ORIF) LEFT TIBIAL PLATEAU FRACTURE  I am recommending open reduction internal fixation for the patient's fracture the depression the lateral tibial plateau seen on CT scan. This depression is approximately 4 mm. The plan will be to elevate the joint surface, back filled with bone graft and place a lateral plate. I spoke with the patient's husband on the phone, given the patient's sedation, with the nurse Reeves Dam present. I explained to him the details of the operation. He  understands that she will have a large lateral incision.  I also discussed the risks and benefits of surgery with him. He understands the risks include but are not limited to infection, bleeding requiring blood transfusion, nerve or blood vessel injury, joint stiffness or loss  of motion, persistent pain, weakness or instability, malunion, nonunion and hardware failure and the need for further surgery. Medical risks include but are not limited to DVT and pulmonary embolism, myocardial infarction, stroke, pneumonia, respiratory failure and death. Patient has been understood these risks and agreed with the plan for surgery. Patient has been cleared for surgery. She has moderate risk for low risk surgery. I reviewed the labs and regular studies in preparation for this case.    Juanell FairlyKRASINSKI, Rolfe Hartsell, MD   07/02/2015 12:41 PM

## 2015-07-02 NOTE — ED Notes (Signed)
Ice pack applied to left knee

## 2015-07-02 NOTE — Op Note (Signed)
07/02/2015  6:21 PM  PATIENT:  [name]    PRE-OPERATIVE DIAGNOSIS:  Closed, comminuted, displaced left tibial plateau fracture  POST-OPERATIVE DIAGNOSIS:  Same  PROCEDURE:  OPEN REDUCTION INTERNAL FIXATION (ORIF) LEFT TIBIAL PLATEAU FRACTURE  SURGEON:  Thornton Park, MD  ANESTHESIA:   General  Tourniqet Time:  90 minutes  EBL:  150cc  PREOPERATIVE INDICATIONS: [name] is a 50 y.o. female with a diagnosis of a closed fracture of the lateral tibial plateau requiring surgical management. The left lateral tibial plateau was depressed proximal 4 mm by CT scan.  I have recommended open reduction, internal fixation for this fracture given the degree of depression of the articular fracture fragment.  I discussed the risks and benefits of surgery. The risks include but are not limited to infection, bleeding requiring blood transfusion, nerve or blood vessel injury, joint stiffness or loss of motion, persistent pain, weakness or instability, malunion, nonunion and hardware failure and the need for further surgery. Medical risks include but are not limited to DVT and pulmonary embolism, myocardial infarction, stroke, pneumonia, respiratory failure and death. Patient understood these risks and wished to proceed.    OPERATIVE IMPLANTS: Biomet ALPS proximal tibial locking plate  OPERATIVE FINDINGS: As above  OPERATIVE PROCEDURE: Patient was met in the preoperative area.  I marked the left lower extremity within the operative field according the hospital's correct site of surgery protocol. She was then brought to the operating room where she underwent general anesthesia. She was positioned supine on the operative table. All bony prominences were adequately padded. A tourniquet was applied to the left thigh. Patient was prepped and draped in a sterile fashion.   A time out was performed to verify the patient's name, date of birth, medical record number, correct site of surgery  and correct procedure to be performed. The timeout was also used to confirm the patient received antibiotics and that appropriate instruments, implants and radiographs studies were available in the room.. The leg was elevated and exsanguinated and the tourniquet was inflated to 275 mmHg.  C-arm images were taken of the left proximal tibia.  An S-shaped incision was made over the lateral proximal tibia. The fascia of the anterior compartment of the leg was then sharply incised. The anterior compartment muscles were then elevated off the proximal tibia laterally with a periosteal elevator. The anterior extent of the fracture was identified and booked open.  An incision was made at the lateral joint line and the lateral meniscus was elevated and tagged for later repair.  This allowed for assessment of the lateral tibial plateau joint line. A bone tamp and mallet were then used to reduce the depressed articular fragment of the lateral tibial plateau. Its position was confirmed on C-arm images. The lateral condyle was then backfilled with cancellous bone chips. These were impacted with a bone tamp. The lateral cortical fragment was then replaced. The lateral proximal tibial plate was then applied to the left proximal tibia and held into position with K wires.  The position of the plate was confirmed on AP and lateral C arm images. A singe rafting screw was then drilled.  The trajectory of this screw was assessed on C arm images and measured for depth.  The bicortical screw was then placed.  A second bicortical screw was then placed in the distal most hole of the plate within the tibial shaft using the same technique. This allowed reduction of the plate to the bone.  The remaining locking raft  screws were placed.  The initial proximal bicortical screw was deemed to be long and was replaced with a shorter locking screw.  Two kickstand screws were also placed through the plate.  C-arm images in both the AP and lateral  position were taken of the final construct. The articular surface appeared near anatomic.   The wound was completely irrigated. The anterior compartment muscles were reduced and the fascia was loosely approximated with closed 0 Vicryl and pie crusted to avoid undue tension. The wound was again copiously irrigated.  The subcutaneous tissue was approximated 2-0 Vicryl. The skin was approximated with staples.  A dry sterile dressing was applied along with a Polar Care and a Breg hinged knee brace.The left lower extremity was elevated on pillows.  Patient was brought to PACU in stable condition. I spoke with the patient's family members in theoperative waitingm to let them know the case was performed  without complication and that the patient was stable in recovery room. Patient will be admitted for postop pain control and IV antibiotics.   Timoteo Gaul, MD

## 2015-07-02 NOTE — OR Nursing (Signed)
FS 130 mg/dl done in PACU prior to surgery.

## 2015-07-03 ENCOUNTER — Encounter: Payer: Self-pay | Admitting: Orthopedic Surgery

## 2015-07-03 LAB — BASIC METABOLIC PANEL
ANION GAP: 1 — AB (ref 5–15)
BUN: 12 mg/dL (ref 6–20)
CO2: 33 mmol/L — AB (ref 22–32)
Calcium: 7.3 mg/dL — ABNORMAL LOW (ref 8.9–10.3)
Chloride: 102 mmol/L (ref 101–111)
Creatinine, Ser: 0.74 mg/dL (ref 0.44–1.00)
GFR calc Af Amer: >60 mL/min (ref >60)
GFR calc non Af Amer: >60 mL/min (ref >60)
GLUCOSE: 168 mg/dL — AB (ref 65–99)
POTASSIUM: 4 mmol/L (ref 3.5–5.1)
Sodium: 136 mmol/L (ref 135–145)

## 2015-07-03 LAB — GLUCOSE, CAPILLARY
GLUCOSE-CAPILLARY: 127 mg/dL — AB (ref 65–99)
GLUCOSE-CAPILLARY: 122 mg/dL — AB (ref 65–99)
GLUCOSE-CAPILLARY: 130 mg/dL — AB (ref 65–99)
Glucose-Capillary: 115 mg/dL — ABNORMAL HIGH (ref 65–99)
Glucose-Capillary: 144 mg/dL — ABNORMAL HIGH (ref 65–99)

## 2015-07-03 LAB — CBC
HEMATOCRIT: 43.4 % (ref 35.0–47.0)
Hemoglobin: 14.2 g/dL (ref 12.0–16.0)
MCH: 32.8 pg (ref 26.0–34.0)
MCHC: 32.8 g/dL (ref 32.0–36.0)
MCV: 100 fL (ref 80.0–100.0)
PLATELETS: 74 10*3/uL — AB (ref 150–440)
RBC: 4.34 MIL/uL (ref 3.80–5.20)
RDW: 14.6 % — ABNORMAL HIGH (ref 11.5–14.5)
WBC: 10.2 10*3/uL (ref 3.6–11.0)

## 2015-07-03 MED ORDER — ENOXAPARIN SODIUM 40 MG/0.4ML ~~LOC~~ SOLN
40.0000 mg | Freq: Two times a day (BID) | SUBCUTANEOUS | Status: DC
  Administered 2015-07-03 – 2015-07-04 (×2): 40 mg via SUBCUTANEOUS
  Filled 2015-07-03 (×2): qty 0.4

## 2015-07-03 NOTE — Evaluation (Signed)
Occupational Therapy Evaluation Patient Details Name: [name] MRN: [id number] DOB: 1966/03/15 Today's Date: 07/03/2015    History of Present Illness Pt is a 50 y.o. female with PMH of diabetes, HTN, hepatitis C and bleeding disorder.  Pt presented with L tibial plateau and L fibular head fracture from mechanical fall.  Pt is s/p ORIF L tibial plateau (07-02-15).   Clinical Impression   Pt is 50 year old female s/p L ORIF of tibial plateau on 07-02-15.  Pt was independent in all ADLs and taking care of her two disabled brothers, one of which uses a wheelchair.  She is eager to return to PLOF.  Pt currently requires moderate assist for LB dressing while in seated position due to pain and limited AROM of L knee and non WB.  Pt would benefit from instruction in dressing techniques with or without assistive devices for dressing and bathing skills.  Pt would also benefit from recommendations for home modifications to increase safety in the bathroom and prevent falls. Will assess for OT [facility] needs as pt progresses in therapy.       Follow Up Recommendations   (will assess as pt progresses in therapy and pain decreases)    Equipment Recommendations  Tub/shower bench    Recommendations for Other Services       Precautions / Restrictions Precautions Precautions: Fall Required Braces or Orthoses:  (Breg Hinged knee brace locked in extension) Restrictions Weight Bearing Restrictions: Yes Other Position/Activity Restrictions: NWB L LE       Mobility Bed Mobility Overal bed mobility: Needs Assistance Bed Mobility: Rolling;Sidelying to Sit Rolling: Min assist Sidelying to sit: Min assist       General bed mobility comments: Increased time, min assist for L LE.  Transfers Overall transfer level: Needs assistance Equipment used: Rolling walker (2 wheeled) Transfers: Sit to/from Stand Sit to Stand: Min assist Stand pivot transfers: Min assist       General transfer comment:  intermittent VC and tactile cues required for hand and walker placement.    Balance Overall balance assessment: Needs assistance Sitting-balance support: Feet supported;Single extremity supported (Bed rail ) Sitting balance-Leahy Scale: Fair     Standing balance support: Bilateral upper extremity supported (RW) Standing balance-Leahy Scale: Fair                              ADL Overall ADL's : Needs assistance/impaired                                       General ADL Comments: Pt sitting in chair with pain 10/10 and is independent in all ADLs except LB bathing and dressing and toileiting which requires mod assisit and cues.  She has a brace on her LLE and is non WB for LLE.  She would benefit from instruction in use of reacher and sock aid to help with LB dressing skills and a transfer tub bench for bathing to increase safety and prevent falls..     Vision     Perception     Praxis      Pertinent Vitals/Pain Pain Assessment: 0-10 Pain Score: 10-Worst pain ever Pain Location: L knee Pain Descriptors / Indicators: Aching;Crushing;Operative site guarding;Grimacing;Constant Pain Intervention(s): Limited activity within patient's tolerance;Repositioned;Monitored during session;Premedicated before session     Hand Dominance Right   Extremity/Trunk Assessment Upper Extremity  Assessment Upper Extremity Assessment: Overall WFL for tasks assessed   Lower Extremity Assessment Lower Extremity Assessment: Defer to PT evaluation   Cervical / Trunk Assessment Cervical / Trunk Assessment: Normal   Communication Communication Communication: No difficulties   Cognition Arousal/Alertness: Awake/alert Behavior During Therapy: WFL for tasks assessed/performed Overall Cognitive Status: Within Functional Limits for tasks assessed                     General Comments       Exercises       Shoulder Instructions      Home Living  Family/patient expects to be discharged to:: Private residence Living Arrangements: Spouse/significant other Available Help at Discharge: Family Type of Home: House Home Access: Ramped entrance   Entrance Stairs-Rails: Can reach both Home Layout: Two level;Able to live on main level with bedroom/bathroom Alternate Level Stairs-Number of Steps: 4 Alternate Level Stairs-Rails: Can reach both Bathroom Shower/Tub: Tub/shower unit Shower/tub characteristics: Engineer, building servicesCurtain Bathroom Toilet: Standard Bathroom Accessibility: Yes How Accessible: Accessible via wheelchair Home Equipment: None          Prior Functioning/Environment Level of Independence: Independent        Comments: was taking care of 2 disabled brothers, one in w/c and one with heart condition    OT Diagnosis: Acute pain   OT Problem List: Decreased strength;Decreased range of motion;Decreased activity tolerance;Pain   OT Treatment/Interventions: Self-care/ADL training;Patient/family education;Therapeutic activities    OT Goals(Current goals can be found in the care plan section) Acute Rehab OT Goals Patient Stated Goal: to go home OT Goal Formulation: With patient Time For Goal Achievement: 07/17/15 Potential to Achieve Goals: Good ADL Goals Pt Will Perform Lower Body Dressing: with min assist;with adaptive equipment;sit to/from stand (using reacher and sock aid) Pt Will Transfer to Toilet: with min assist;bedside commode;regular height toilet (BSC over toilet)  OT Frequency: Min 1X/week   Barriers to D/C:            Co-evaluation              End of Session    Activity Tolerance: Patient limited by pain Patient left: in chair;with call bell/phone within reach;with chair alarm set;with nursing/sitter in room   Time: 1440-1513 OT Time Calculation (min): 33 min Charges:  OT General Charges $OT Visit: 1 Procedure OT Evaluation $OT Eval Low Complexity: 1 Procedure OT Treatments $Self Care/Home  Management : 8-22 mins G-Codes:    Wofford,Susan 07/03/2015, 4:16 PM   Susanne BordersSusan Wofford, OTR/L ascom [phone]

## 2015-07-03 NOTE — Evaluation (Signed)
Physical Therapy Evaluation Patient Details Name: ZOHA SPRANGER MRN: [id number] DOB: 09/04/1965 Today's Date: 07/03/2015   History of Present Illness  Pt is a 50 y.o. female with PMH of diabetes, HTN, hepatitis C and bleeding disorder.  Pt presented with L tibial plateau and L fibular head fracture from mechanical fall.  Pt is s/p ORIF L tibial plateau (07-02-15).    Clinical Impression  Prior to admission pt was independent.  Pt lives with husband and children.  Pt was min assist for bed mobility and min assist for sit to stand.  Pt performed two sets of sit to stand with RW, and one set of stand pivot transfer.  Pt performed one sit to stand from sitting EOB and other from bedside commode.  Pt ambulated 3 feet with min assist with RW.  Pt required intermittent VC's and tactile cues for hand and walker placement for transfers and ambulation.  Due to aforementioned function and strength deficits, pt is in need of skilled physical therapy.  It is recommended that pending pt's progress, discharged to home with home health PT when medically appropriate.     Follow Up Recommendations Home health PT (Pending pt's progress discharge home )    Equipment Recommendations  Rolling walker with 5" wheels    Recommendations for Other Services       Precautions / Restrictions Precautions Precautions: Fall Required Braces or Orthoses:  (Breg Hinged knee brace locked into extension) Restrictions Weight Bearing Restrictions: Yes Other Position/Activity Restrictions: NWB L LE       Mobility  Bed Mobility Overal bed mobility: Needs Assistance Bed Mobility: Rolling;Sidelying to Sit Rolling: Min assist Sidelying to sit: Min assist       General bed mobility comments: Increased time, min assist for L LE.  Transfers Overall transfer level: Needs assistance Equipment used: Rolling walker (2 wheeled) Transfers: Sit to/from [employer] (2 sets of sit to stand ) Sit to Stand: Min  assist Stand pivot transfers: Min assist       General transfer comment: intermittent VC and tactile cues required for hand and walker placement.  Ambulation/Gait Ambulation/Gait assistance: Min assist Ambulation Distance (Feet): 3 Feet Assistive device: Rolling walker (2 wheeled) Gait Pattern/deviations: Step-to pattern Gait velocity: decreased    General Gait Details: NWB on L LE, performed hopping strategy for R LE .  Required intermittent VC's and tactile cues for hand and walker placement.  Stairs            Wheelchair Mobility    Modified Rankin (Stroke Patients Only)       Balance Overall balance assessment: Needs assistance Sitting-balance support: Feet supported (R foot supported, L for NWB precautions ) Sitting balance-Leahy Scale: Fair     Standing balance support: Bilateral upper extremity supported (RW) Standing balance-Leahy Scale: Fair                               Pertinent Vitals/Pain Pain Assessment: 0-10 Pain Score: 10-Worst pain ever Pain Location: L Knee Pain Descriptors / Indicators: Aching;Grimacing;Constant;Operative site guarding Pain Intervention(s): Limited activity within patient's tolerance;Monitored during session;Patient requesting pain meds-RN notified;Repositioned    Home Living Family/patient expects to be discharged to:: Private residence Living Arrangements: Spouse/significant other Available Help at Discharge: Family   Home Access: Ramped entrance Entrance Stairs-Rails: Can reach both   Home Layout: Two level (Bedroom on first floor ) Home Equipment: None      Prior Function  Level of Independence: Independent               Hand Dominance        Extremity/Trunk Assessment   Upper Extremity Assessment: Overall WFL for tasks assessed           Lower Extremity Assessment: Generalized weakness (L LE difficult to move due to pain.  R LE WFL for tasks assessed)  R Hip flexors, hip abductors,  hamstrings, dorsiflexors, plantarflexors: At least a 3/5.  L LE, inability to assess due to pain and L hinged brace locked in extension.      Cervical / Trunk Assessment: Normal  Communication   Communication: No difficulties  Cognition Arousal/Alertness: Awake/alert Behavior During Therapy: WFL for tasks assessed/performed Overall Cognitive Status: Within Functional Limits for tasks assessed                      General Comments  Nursing was contacted and cleared pt for physical therapy.  Pt was agreeable and tolerated session well.  Nursing was present during sit to stand from commode.  L Hinged knee brace was locked in extension.     Exercises        Assessment/Plan    PT Assessment Patient needs continued PT services  PT Diagnosis Difficulty walking   PT Problem List Decreased strength;Decreased range of motion;Decreased activity tolerance;Decreased balance;Decreased mobility;Pain  PT Treatment Interventions DME instruction;Gait training;Functional mobility training;Therapeutic activities;Therapeutic exercise;Balance training;Patient/family education   PT Goals (Current goals can be found in the Care Plan section) Acute Rehab PT Goals Patient Stated Goal: to go home PT Goal Formulation: With patient Time For Goal Achievement: 07/17/15 Potential to Achieve Goals: Good    Frequency BID   Barriers to discharge        Co-evaluation               End of Session Equipment Utilized During Treatment: Gait belt Activity Tolerance: Patient tolerated treatment well Patient left: in chair;with call bell/phone within reach;with chair alarm set (Polar care activated, B towels under heels) Nurse Communication: Mobility status         Time: [id number] PT Time Calculation (min) (ACUTE ONLY): 44 min   Charges:         PT G Codes:       Lyndel SafeGarrett Annaliza Zia, SPT Lyndel SafeGarrett Aseret Hoffman 07/03/2015, 12:52 PM

## 2015-07-03 NOTE — Progress Notes (Signed)
Clinical Social Worker (CSW) received SNF consult. PT is recommending home health. RN Case Manager is aware of above. Please reconsult if future social work needs arise. CSW signing off.   Jahlia Omura Morgan, LCSW [phone] 

## 2015-07-03 NOTE — Care Management Note (Signed)
Case Management Note  Patient Details  Name: [name] MRN: [id number] Date of Birth: 1966/02/01  Subjective/Objective:    Lovenox 40mg  SQ daily x 4 weeks called to [facility] in [place]. Ph: [phone]. Heads up referral to Anibal Hendersonim Henderson at [facility] for potential home health PT.                 Action/Plan:   Expected Discharge Date:  07/04/15               Expected Discharge Plan:     In-House Referral:     Discharge planning Services     Post Acute Care Choice:    Choice offered to:     DME Arranged:    DME Agency:     HH Arranged:    HH Agency:     Status of Service:     Medicare Important Message Given:    Date Medicare IM Given:    Medicare IM give by:    Date Additional Medicare IM Given:    Additional Medicare Important Message give by:     If discussed at Long Length of Stay Meetings, dates discussed:    Additional Comments:  Brittany Weatherwax A, RN 07/03/2015, 12:13 PM

## 2015-07-03 NOTE — Progress Notes (Addendum)
[facility]   PATIENT NAME: [name]    MR#:  [id number]  DATE OF BIRTH:  1966-02-12  SUBJECTIVE:  CHIEF COMPLAINT:   Chief Complaint  Patient presents with  . Knee Pain   no complaint except knee pain. S/p left knee surgery yesterday.  REVIEW OF SYSTEMS:    Review of Systems  Constitutional: Negative for fever and chills.  HENT: Negative for sore throat.  Eyes: Negative for blurred vision, double vision and pain.  Respiratory: Negative for cough, hemoptysis, shortness of breath and wheezing.  Cardiovascular: Negative for chest pain, palpitations, orthopnea and leg swelling.  Gastrointestinal:  Negative for heartburn, no abdominal pain, nausea, vomiting, diarrhea or constipation.  Genitourinary: Negative for dysuria and hematuria.  Musculoskeletal: Negative for back pain and joint pain.  Skin: Negative for rash.  Neurological: Negative for sensory change, speech change, focal weakness and headaches.  Endo/Heme/Allergies: Does not bruise/bleed easily.  Psychiatric/Behavioral: Negative for depression. The patient is not nervous/anxious   DRUG ALLERGIES:  No Known Allergies  VITALS:  Blood pressure 133/81, pulse 92, temperature 96.8 F (36 C), temperature source Axillary, resp. rate 18, height  (1.575 m), weight 121.02 kg (266 lb 12.8 oz), SpO2 87 %.  PHYSICAL EXAMINATION:   Physical Exam  GENERAL:  50 y.o.-year-old patient lying in the bed with no acute distress. Obese EYES: Pupils equal, round, reactive to light and accommodation. No scleral icterus. Extraocular muscles intact.  HEENT: Head atraumatic, normocephalic. Oropharynx and nasopharynx clear.  NECK:  Supple, no jugular venous distention. No thyroid enlargement, no tenderness.  LUNGS: Normal breath sounds bilaterally, no wheezing, rales, rhonchi. No use of accessory muscles of respiration.  CARDIOVASCULAR: S1, S2 normal. No murmurs, rubs, or gallops.   ABDOMEN: Soft, nontender, nondistended. Bowel sounds present. No organomegaly or mass.  EXTREMITIES: No cyanosis, clubbing or edema b/l.   Cast on left knee.  NEUROLOGIC: Moves all 4 extremities except left leg. PSYCHIATRIC: The patient is drowsy SKIN: No obvious rash, lesion, or ulcer.   LABORATORY PANEL:   CBC  Recent Labs Lab 07/02/15 0427  WBC 6.1  HGB 15.8  HCT 47.3*  PLT 91*   ------------------------------------------------------------------------------------------------------------------ Chemistries   Recent Labs Lab 07/02/15 0427  NA 134*  K 3.6  CL 100*  CO2 30  GLUCOSE 192*  BUN 16  CREATININE 0.86  CALCIUM 8.2*  AST 81*  ALT 74*  ALKPHOS 114  BILITOT 1.1   ------------------------------------------------------------------------------------------------------------------  Cardiac Enzymes No results for input(s): TROPONINI in the last 168 hours. ------------------------------------------------------------------------------------------------------------------  RADIOLOGY:  Dg Tibia/fibula Left  07/02/2015  CLINICAL DATA:  ORIF of left tibia. EXAM: LEFT TIBIA AND FIBULA - 2 VIEW COMPARISON:  07/02/2015 FINDINGS: There has been interval open reduction and internal fixation of the proximal left tibial plateau fracture. The hardware components and fracture fragments are in anatomic alignment. IMPRESSION: 1. Status post ORIF of proximal tibial fracture. Electronically Signed   By: Signa Kell M.D.   On: 07/02/2015 17:33   Ct Knee Left Wo Contrast  07/02/2015  CLINICAL DATA:  Felt left knee pop, with pain. Assess tibial plateau fracture. Initial encounter. EXAM: CT OF THE LEFT KNEE WITHOUT CONTRAST TECHNIQUE: Multidetector CT imaging of the left knee was performed according to the standard protocol. Multiplanar CT image reconstructions were also generated. COMPARISON:  Left knee radiographs performed earlier today at 2:35 a.m. FINDINGS: There is a comminuted  fracture of the lateral tibial plateau, reflecting a Schatzker type 2, or  AO/OTA type B3, fracture. 3-4 mm of depression is noted along the lateral tibial plateau. Fracture lines extend to the tibial spine, including the medial aspect of the tibial spine. Fracture lines extend along the tibial metadiaphysis, without evidence of complete dissociation. There appears to be a nondisplaced fracture of the fibular head. A relatively large lipohemarthrosis is noted. There is mild atrophy of some of the visualized musculature. No definite vascular abnormalities are seen, though evaluation of the vasculature is limited without contrast. Mild soft tissue injury is noted about the knee. The anterior and posterior cruciate ligaments appear grossly intact. The medial collateral ligament and lateral collateral ligament complex are grossly unremarkable in appearance, though difficult to fully assess. The menisci are not well characterized. The quadriceps and patellar tendons remain intact. IMPRESSION: 1. Comminuted fracture of the lateral tibial plateau, reflecting a Schatzker type 2, or AO/OTA type B3 fracture. 3-4 mm of depression along the lateral tibial plateau. Fracture lines extend to the tibial spine, including the medial aspect of the tibial spine. 2. Nondisplaced fracture of the fibular head. 3. Relatively large lipohemarthrosis noted. Electronically Signed   By: Roanna RaiderJeffery  Chang M.D.   On: 07/02/2015 05:40   Ct 3d Independent Annabell SabalWkst  07/02/2015  CLINICAL DATA:  Nonspecific (abnormal) findings on radiological and other examination of musculoskeletal sysem. EXAM: 3-DIMENSIONAL CT IMAGE RENDERING ON INDEPENDENT WORKSTATION TECHNIQUE: 3-dimensional CT images were rendered by post-processing of the original CT data on an independent workstation. The 3-dimensional CT images were interpreted and findings were reported in the accompanying complete CT report for this study COMPARISON:  CT scan, same date. FINDINGS: 3D images were  performed at the independent workstation to help and preoperative assessment. There is a mildly depressed die punch type lateral tibial plateau fracture with impaction and comminution. Please see 3D images. IMPRESSION: Comminuted and mildly depressed lateral tibial plateau fracture. Electronically Signed   By: Rudie MeyerP.  Gallerani M.D.   On: 07/02/2015 13:54   Chest Portable 1 View  07/02/2015  CLINICAL DATA:  Preoperative chest radiograph.  Initial encounter. EXAM: PORTABLE CHEST 1 VIEW COMPARISON:  Chest radiograph performed 05/28/2015 FINDINGS: The lungs are well-aerated. Vascular congestion is noted. Chronically increased interstitial markings are noted. There is no evidence of pleural effusion or pneumothorax. The cardiomediastinal silhouette is borderline normal in size. No acute osseous abnormalities are seen. IMPRESSION: Vascular congestion noted. Chronically increased interstitial markings seen. Electronically Signed   By: Roanna RaiderJeffery  Chang M.D.   On: 07/02/2015 05:30   Dg Knee Complete 4 Views Left  07/02/2015  CLINICAL DATA:  50 year old female with left knee trauma and pain EXAM: LEFT KNEE - COMPLETE 4+ VIEW COMPARISON:  Left knee radiograph dated 04/21/2005 FINDINGS: There is comminuted appearing fracture of the lateral aspect of the proximal tibial metaphysis with extension of the fracture line to the lateral tibial plateau. There is associated mild depression of the lateral tibial plateau and widening of the lateral knee compartment concerning for underlying ligamentous and/or meniscal injury. No other fracture identified. The bones are osteopenic. There is a suprapatellar joint effusion. There is mild soft tissue swelling of the knee. No radiopaque foreign object. IMPRESSION: Comminuted appearing depressed fracture of the lateral tibial plateau with widening of the lateral knee compartment. Findings concerning for underlying meniscal/ligamentous injury. Orthopedic consult is advised. Electronically Signed    By: Elgie CollardArash  Radparvar M.D.   On: 07/02/2015 02:59   Dg Knee Left Port  07/02/2015  CLINICAL DATA:  Left tibial plateau fracture. Postop. Initial encounter. EXAM: PORTABLE  LEFT KNEE - 1-2 VIEW COMPARISON:  07/02/2015 FINDINGS: Lateral fixation plate and screws are seen across the tibial metaphysis. Fracture fragments are in near anatomic alignment. Skin staples and external knee brace are seen in place which limits fine bone detail. IMPRESSION: Internal fixation of tibial plateau fractures in near anatomic alignment. Electronically Signed   By: Myles Rosenthal M.D.   On: 07/02/2015 19:24   Dg C-arm 61-120 Min  07/02/2015  CLINICAL DATA:  ORIF of left tib fib EXAM: DG C-ARM 61-120 MIN COMPARISON:  Earlier today FINDINGS: Sideplate and screw device reduces the proximal tibial plateau fracture. The hardware components and fracture fragments are in anatomic alignment. IMPRESSION: 1. Status post ORIF of proximal tibial plateau fracture. Electronically Signed   By: Signa Kell M.D.   On: 07/02/2015 17:34     ASSESSMENT AND PLAN:   1. Left tibial plateau fracture and Left fibular fracture, POD1. Pain control. 2. COPD with Emphysema. Stable. Encourage deep breath. NEB prn.  3. Hypertension. Controlled.  IV when necessary antihypertensives. Continue vasotec. 4. Type 2 diabetes mellitus. Controlled. Sliding-scale insulin. Continue metformin. 5. Alcohol intoxication. On CIWA. No withdrawal. 6. Tobacco abuse. Smoking cessation was counseled for 3 min.  Chronic thrombocytopenia. Stable.  Medically stable. She will be discharged tomorrow per Dr. Walden Field. Sign off.  All the records are reviewed and case discussed with Care Management/Social Workerr. Management plans discussed with the patient, family and they are in agreement. Greater than 50% time was spent on coordination of care and face-to-face counseling. DVT Prophylaxis: SCDs  TOTAL TIME TAKING CARE OF THIS PATIENT: 27 minutes.    Shaune Pollack  M.D on 07/03/2015 at 1:14 PM  Between 7am to 6pm - Pager - [phone]  After 6pm go to [email or web address] - password [email or web address]  [facility]  Office  [phone]  CC: Primary care physician; Hyman Hopes, MD  Note: This dictation was prepared with Dragon dictation along with smaller phrase technology. Any transcriptional errors that result from this process are unintentional.

## 2015-07-03 NOTE — Progress Notes (Signed)
Physical Therapy Treatment Patient Details Name: [name] MRN: [id number] DOB: [birth year] Today's Date: 07/03/2015    History of Present Illness Pt is a 50 y.o. female with PMH of diabetes, HTN, hepatitis C and bleeding disorder.  Pt presented with L tibial plateau and L fibular head fracture from mechanical fall.  Pt is s/p ORIF L tibial plateau (07-02-15).    PT Comments    Pt was min assist for bed mobility and sit to stand (with RW).  Pt was min assist with ambulation for 20 feet with RW (Pt ambulated to door and five feet back towards bed when pt reported that she needed to sit down) distance limited due to pain and fatigue.  Pt sat in chair that was following and chair was rolled back next to bed. PT will attempt greater ambulation distance next session.     Follow Up Recommendations  Home health PT     Equipment Recommendations  Rolling walker with 5" wheels    Recommendations for Other Services       Precautions / Restrictions Precautions Precautions: Fall Required Braces or Orthoses:  (Breg Hinged knee brace locked in extension) Restrictions Weight Bearing Restrictions: Yes Other Position/Activity Restrictions: NWB L LE     Mobility  Bed Mobility Overal bed mobility: Needs Assistance Bed Mobility: Rolling;Sidelying to Sit Rolling: Min assist Sidelying to sit: Min assist       General bed mobility comments: Increased time, min assist for L LE.  Transfers Overall transfer level: Needs assistance Equipment used: Rolling walker (2 wheeled) Transfers: Sit to/from Stand Sit to Stand: Min assist Stand pivot transfers: Min assist       General transfer comment: intermittent VC and tactile cues required for hand and walker placement.  Ambulation/Gait Ambulation/Gait assistance: Min assist Ambulation Distance (Feet): 20 Feet Assistive device: Rolling walker (2 wheeled) Gait velocity: decreased    General Gait Details: NWB on L LE, performed hopping  strategy for R LE .  Intermittent VC's and tactile cues for hand and walker placement.   Stairs            Wheelchair Mobility    Modified Rankin (Stroke Patients Only)       Balance Overall balance assessment: Needs assistance Sitting-balance support: Feet supported;Single extremity supported (Bed rail ) Sitting balance-Leahy Scale: Fair     Standing balance support: Bilateral upper extremity supported (RW) Standing balance-Leahy Scale: Fair                      Cognition Arousal/Alertness: Awake/alert Behavior During Therapy: WFL for tasks assessed/performed Overall Cognitive Status: Within Functional Limits for tasks assessed                      Exercises      General Comments   Nursing was contacted and cleared pt for physical therapy.  Pt was agreeable and had one moment of irritation when cueing was required for safety.  Session limited due to pain and fatigue.       Pertinent Vitals/Pain Pain Assessment: 0-10 Pain Score: 10-Worst pain ever Pain Location: L knee  Pain Descriptors / Indicators: Aching;Grimacing;Constant;Operative site guarding Pain Intervention(s): Limited activity within patient's tolerance;Repositioned;Ice applied;Monitored during session  See flow sheet for vitals.     Home Living    Prior Function Level of Independence: Independent          PT Goals (current goals can now be found in the care plan section)  Acute Rehab PT Goals Patient Stated Goal: to go home PT Goal Formulation: With patient Time For Goal Achievement: 07/17/15 Potential to Achieve Goals: Good Additional Goals Additional Goal #1: Pt will be able to navigate L LE with no assistance for bed mobility. Progress towards PT goals: Progressing toward goals    Frequency  BID    PT Plan      Co-evaluation             End of Session Equipment Utilized During Treatment: Gait belt;Oxygen (2 L/min O2 via nasal cannula) Activity Tolerance:  Patient limited by fatigue;Patient limited by pain Patient left: in chair;with call bell/phone within reach;with chair alarm set;with SCD's reapplied (Pillow under L LE and polarcare activated)     Time: [id number] PT Time Calculation (min) (ACUTE ONLY): 23 min  Charges:                      G Codes:      Lyndel SafeGarrett Rusell Meneely, SPT Lyndel SafeGarrett Jermany Rimel 07/03/2015, 3:19 PM

## 2015-07-03 NOTE — Care Management Note (Addendum)
Case Management Note  Patient Details  Name: [name] MRN: [id number] Date of Birth: Jul 11, 1965  Subjective/Objective:   50yo Brittany [name] received [name] surgical repair of [name] left tibial fracture by Dr Martha ClanKrasinski on 07/02/15. She is married and resides at home with her husband who can provide transportation to appointments. PCP=Harriett Burns. Pharmacy=[facility] in [place]. She has no home assistive equipment and Brittany need [name] rolling walker and [name] BSC if she is discharged to home. She uses no home oxygen and is receiving no home health services. Provided Brittany [name] with [name] list of home health providers and she voiced no preference but agreed upon [facility]. [name] heads-up [facility] was [name]. Currently on CIWA per blood ETOH level upon admission was 171. Case management Brittany follow for discharge planning.  [name] delivered [name] rolling walker and [name] BSC. Weight =266lbs. Height=5\' 2" . Brittany to evaluate for Bariatric equipment need.  [name] called to report that [name] is not accepting Medicaid patients at this time. Medicaid does not pay for HHPT anyway. Anticipate discharge home with [facility] depending on [facility]-PT recommendation.   Action/Plan:   Expected Discharge Date:  07/04/15               Expected Discharge Plan:     In-House Referral:     Discharge planning Services     Post Acute Care Choice:    Choice offered to:     DME Arranged:    DME Agency:     HH Arranged:    HH Agency:     Status of Service:     Medicare Important Message Given:    Date Medicare IM Given:    Medicare IM give by:    Date Additional Medicare IM Given:    Additional Medicare Important Message give by:     If discussed at Long Length of Stay Meetings, dates discussed:    Additional Comments:  Brittany Granberg A, RN 07/03/2015, 12:34 PM

## 2015-07-03 NOTE — Progress Notes (Signed)
Subjective:  POD #1  status post open reduction internal fixation for left tibial plateau fracture. Patient reports pain as moderate.  Patient's pain is somewhat improved today. Patient was able to get up with physical therapy today and take 3 steps with her walker.  Objective:   VITALS:   Filed Vitals:   07/03/15 0012 07/03/15 0428 07/03/15 0759 07/03/15 1241  BP: 130/79 134/73 133/81   Pulse: 97 88 86 92  Temp: 98 F (36.7 C) 97.8 F (36.6 C) 96.8 F (36 C)   TempSrc: Axillary Axillary Axillary   Resp: Height:      Weight:      SpO2: 96% 94% 100% 87%    PHYSICAL EXAM:  Left lower extremity: Patient's dressing is clean dry and intact. She has a Polar Care sleeve and hinge knee brace in place which is locked in extension. She has palpable pedal pulses. She has intact sensation light touch and intact motor function in the left ankle and toes. Patient's left lower extremity is elevated on pillows.   LABS  Results for orders placed or performed during the hospital encounter of 07/02/15 (from the past 24 hour(s))  Glucose, capillary     Status: Abnormal   Collection Time: 07/02/15 10:12 PM  Result Value Ref Range   Glucose-Capillary 156 (H) 65 - 99 mg/dL  Glucose, capillary     Status: Abnormal   Collection Time: 07/03/15  7:59 AM  Result Value Ref Range   Glucose-Capillary 127 (H) 65 - 99 mg/dL   Comment 1 Notify RN   Glucose, capillary     Status: Abnormal   Collection Time: 07/03/15 10:15 AM  Result Value Ref Range   Glucose-Capillary 115 (H) 65 - 99 mg/dL  Glucose, capillary     Status: Abnormal   Collection Time: 07/03/15 12:17 PM  Result Value Ref Range   Glucose-Capillary 144 (H) 65 - 99 mg/dL    Dg Tibia/fibula Left  07/02/2015  CLINICAL DATA:  ORIF of left tibia. EXAM: LEFT TIBIA AND FIBULA - 2 VIEW COMPARISON:  07/02/2015 FINDINGS: There has been interval open reduction and internal fixation of the proximal left tibial plateau fracture. The hardware  components and fracture fragments are in anatomic alignment. IMPRESSION: 1. Status post ORIF of proximal tibial fracture. Electronically Signed   By: Signa Kell M.D.   On: 07/02/2015 17:33   Ct Knee Left Wo Contrast  07/02/2015  CLINICAL DATA:  Felt left knee pop, with pain. Assess tibial plateau fracture. Initial encounter. EXAM: CT OF THE LEFT KNEE WITHOUT CONTRAST TECHNIQUE: Multidetector CT imaging of the left knee was performed according to the standard protocol. Multiplanar CT image reconstructions were also generated. COMPARISON:  Left knee radiographs performed earlier today at 2:35 a.m. FINDINGS: There is a comminuted fracture of the lateral tibial plateau, reflecting a Schatzker type 2, or AO/OTA type B3, fracture. 3-4 mm of depression is noted along the lateral tibial plateau. Fracture lines extend to the tibial spine, including the medial aspect of the tibial spine. Fracture lines extend along the tibial metadiaphysis, without evidence of complete dissociation. There appears to be a nondisplaced fracture of the fibular head. A relatively large lipohemarthrosis is noted. There is mild atrophy of some of the visualized musculature. No definite vascular abnormalities are seen, though evaluation of the vasculature is limited without contrast. Mild soft tissue injury is noted about the knee. The anterior and posterior cruciate ligaments appear grossly intact. The medial collateral ligament and  lateral collateral ligament complex are grossly unremarkable in appearance, though difficult to fully assess. The menisci are not well characterized. The quadriceps and patellar tendons remain intact. IMPRESSION: 1. Comminuted fracture of the lateral tibial plateau, reflecting a Schatzker type 2, or AO/OTA type B3 fracture. 3-4 mm of depression along the lateral tibial plateau. Fracture lines extend to the tibial spine, including the medial aspect of the tibial spine. 2. Nondisplaced fracture of the fibular head.  3. Relatively large lipohemarthrosis noted. Electronically Signed   By: Roanna RaiderJeffery  Chang M.D.   On: 07/02/2015 05:40   Ct 3d Independent Annabell SabalWkst  07/02/2015  CLINICAL DATA:  Nonspecific (abnormal) findings on radiological and other examination of musculoskeletal sysem. EXAM: 3-DIMENSIONAL CT IMAGE RENDERING ON INDEPENDENT WORKSTATION TECHNIQUE: 3-dimensional CT images were rendered by post-processing of the original CT data on an independent workstation. The 3-dimensional CT images were interpreted and findings were reported in the accompanying complete CT report for this study COMPARISON:  CT scan, same date. FINDINGS: 3D images were performed at the independent workstation to help and preoperative assessment. There is a mildly depressed die punch type lateral tibial plateau fracture with impaction and comminution. Please see 3D images. IMPRESSION: Comminuted and mildly depressed lateral tibial plateau fracture. Electronically Signed   By: Rudie MeyerP.  Gallerani M.D.   On: 07/02/2015 13:54   Chest Portable 1 View  07/02/2015  CLINICAL DATA:  Preoperative chest radiograph.  Initial encounter. EXAM: PORTABLE CHEST 1 VIEW COMPARISON:  Chest radiograph performed 05/28/2015 FINDINGS: The lungs are well-aerated. Vascular congestion is noted. Chronically increased interstitial markings are noted. There is no evidence of pleural effusion or pneumothorax. The cardiomediastinal silhouette is borderline normal in size. No acute osseous abnormalities are seen. IMPRESSION: Vascular congestion noted. Chronically increased interstitial markings seen. Electronically Signed   By: Roanna RaiderJeffery  Chang M.D.   On: 07/02/2015 05:30   Dg Knee Complete 4 Views Left  07/02/2015  CLINICAL DATA:  50 year old female with left knee trauma and pain EXAM: LEFT KNEE - COMPLETE 4+ VIEW COMPARISON:  Left knee radiograph dated 04/21/2005 FINDINGS: There is comminuted appearing fracture of the lateral aspect of the proximal tibial metaphysis with extension of the  fracture line to the lateral tibial plateau. There is associated mild depression of the lateral tibial plateau and widening of the lateral knee compartment concerning for underlying ligamentous and/or meniscal injury. No other fracture identified. The bones are osteopenic. There is a suprapatellar joint effusion. There is mild soft tissue swelling of the knee. No radiopaque foreign object. IMPRESSION: Comminuted appearing depressed fracture of the lateral tibial plateau with widening of the lateral knee compartment. Findings concerning for underlying meniscal/ligamentous injury. Orthopedic consult is advised. Electronically Signed   By: Elgie CollardArash  Radparvar M.D.   On: 07/02/2015 02:59   Dg Knee Left Port  07/02/2015  CLINICAL DATA:  Left tibial plateau fracture. Postop. Initial encounter. EXAM: PORTABLE LEFT KNEE - 1-2 VIEW COMPARISON:  07/02/2015 FINDINGS: Lateral fixation plate and screws are seen across the tibial metaphysis. Fracture fragments are in near anatomic alignment. Skin staples and external knee brace are seen in place which limits fine bone detail. IMPRESSION: Internal fixation of tibial plateau fractures in near anatomic alignment. Electronically Signed   By: Myles RosenthalJohn  Stahl M.D.   On: 07/02/2015 19:24   Dg C-arm 61-120 Min  07/02/2015  CLINICAL DATA:  ORIF of left tib fib EXAM: DG C-ARM 61-120 MIN COMPARISON:  Earlier today FINDINGS: Sideplate and screw device reduces the proximal tibial plateau fracture. The hardware components  and fracture fragments are in anatomic alignment. IMPRESSION: 1. Status post ORIF of proximal tibial plateau fracture. Electronically Signed   By: Signa Kell M.D.   On: 07/02/2015 17:34    Assessment/Plan: 1 Day Post-Op   Active Problems:   Tibial plateau fracture  Patient is preventing postop. I have ordered labs redrawn this afternoon to check a CBC and BMP postop. She will continue physical therapy. She will was encouraged to use incentive spirometry while awake.  Patient will continue to require pain management. Possible discharge tomorrow depending on how she progresses with physical therapy. I have ordered a bedside commode a rolling walker for her.    Juanell Fairly , MD 07/03/2015, 1:03 PM

## 2015-07-03 NOTE — Plan of Care (Signed)
Problem: Pain Managment: Goal: General experience of comfort will improve Outcome: Progressing Pt post op day one. Managing pain through pharmacologic means. Pt verbalizes understanding that pain should be addressed before it gets to its worst point (10).

## 2015-07-03 NOTE — Progress Notes (Signed)
Anticoagulation monitoring(Lovenox):  50yo  ordered Lovenox 30 mg Q12h  Filed Weights   07/02/15 0224 07/02/15 0551  Weight: 254 lb (115.214 kg) 266 lb 12.8 oz (121.02 kg)    Body mass index is 48.79 kg/(m^2).   Lab Results  Component Value Date   CREATININE 0.86 07/02/2015   CREATININE 0.84 05/28/2015   CREATININE 0.72 04/14/2014   Estimated Creatinine Clearance: 97 mL/min (by C-G formula based on Cr of 0.86). Hemoglobin & Hematocrit     Component Value Date/Time   HGB 15.8 07/02/2015 0427   HGB 14.5 04/14/2014 0527   HCT 47.3* 07/02/2015 0427   HCT 45.1 04/14/2014 0527     Per Protocol for Patient with estCrcl> 30 ml/min and BMI > 40, will transition to Lovenox 40 mg Q12h.

## 2015-07-04 LAB — CBC
HEMATOCRIT: 40.7 % (ref 35.0–47.0)
HEMOGLOBIN: 13.6 g/dL (ref 12.0–16.0)
MCH: 33.5 pg (ref 26.0–34.0)
MCHC: 33.3 g/dL (ref 32.0–36.0)
MCV: 100.7 fL — AB (ref 80.0–100.0)
Platelets: 71 10*3/uL — ABNORMAL LOW (ref 150–440)
RBC: 4.04 MIL/uL (ref 3.80–5.20)
RDW: 14.4 % (ref 11.5–14.5)
WBC: 10.4 10*3/uL (ref 3.6–11.0)

## 2015-07-04 LAB — GLUCOSE, CAPILLARY
GLUCOSE-CAPILLARY: 141 mg/dL — AB (ref 65–99)
Glucose-Capillary: 135 mg/dL — ABNORMAL HIGH (ref 65–99)

## 2015-07-04 MED ORDER — OXYCODONE HCL 5 MG PO TABS: 5.0000 mg | ORAL_TABLET | ORAL | Status: DC | PRN

## 2015-07-04 MED ORDER — ENOXAPARIN SODIUM 40 MG/0.4ML ~~LOC~~ SOLN: 40.0000 mg | Freq: Two times a day (BID) | SUBCUTANEOUS | Status: DC

## 2015-07-04 NOTE — Discharge Summary (Signed)
Physician Discharge Summary  Patient ID: [name] MRN: [id number] DOB/AGE: 09/11/1965 50 y.o.  Admit date: 07/02/2015 Discharge date: 07/04/2015  Admission Diagnoses:  left tibial plateau fracture  Discharge Diagnoses:  left tibial plateau fracture Active Problems:   Tibial plateau fracture   Past Medical History  Diagnosis Date  . Diabetes mellitus without complication (HCC)   . Hypertension   . Hepatitis C, acute   . Sleep apnea   . Bleeding disorder (HCC)     Surgeries: Procedure(s): OPEN REDUCTION INTERNAL FIXATION (ORIF) TIBIAL PLATEAU on 07/02/2015   Consultants (if any):    Discharged Condition: Improved  Hospital Course: [name] is an 50 y.o. female who was admitted 07/02/2015 with a diagnosis of  left tibial plateau fracture  and went to the operating room on 07/02/2015 and underwent an open reduction internal fixation with bone grafting.    She was given perioperative antibiotics:  Anti-infectives    Start     Dose/Rate Route Frequency Ordered Stop   07/02/15 2000  ceFAZolin (ANCEF) IVPB 2 g/50 mL premix     2 g 100 mL/hr over 30 Minutes Intravenous Every 6 hours 07/02/15 1955 07/03/15 0302   07/02/15 1443  ceFAZolin (ANCEF) IVPB 2 g/50 mL premix     2 g 100 mL/hr over 30 Minutes Intravenous 30 min pre-op 07/02/15 1443 07/02/15 1626    .  She was given sequential compression devices, early ambulation, and  Lovenox for DVT prophylaxis.  She benefited maximally from the hospital stay and there were no complications.  Patient had labs checked postoperatively which were stable. She was evaluated by physical and occupational therapy on postop day #1 and the continued to see arthritis hospital stay. Patient's main positive operative issue was pain which is improving at the time of discharge.  Recent vital signs:  Filed Vitals:   07/04/15 0736 07/04/15 1124  BP: 132/71   Pulse: 105 114  Temp: 98.7 F (37.1 C)   Resp: 19     Recent laboratory  studies:  Lab Results  Component Value Date   HGB 13.6 07/04/2015   HGB 14.2 07/03/2015   HGB 15.8 07/02/2015   Lab Results  Component Value Date   WBC 10.4 07/04/2015   PLT 71* 07/04/2015   Lab Results  Component Value Date   INR 1.36 07/02/2015   Lab Results  Component Value Date   NA 136 07/03/2015   K 4.0 07/03/2015   CL 102 07/03/2015   CO2 33* 07/03/2015   BUN 12 07/03/2015   CREATININE 0.74 07/03/2015   GLUCOSE 168* 07/03/2015    Discharge Medications:     Medication List    TAKE these medications        ALPRAZolam 1 MG tablet  Commonly known as:  XANAX  Take 1 mg by mouth 3 (three) times daily.     enoxaparin 40 MG/0.4ML injection  Commonly known as:  LOVENOX  Inject 0.4 mLs (40 mg total) into the skin every 12 (twelve) hours.     fluticasone 50 MCG/ACT nasal spray  Commonly known as:  FLONASE  Place 2 sprays into both nostrils daily.     insulin detemir 100 UNIT/ML injection  Commonly known as:  LEVEMIR  Inject 10-20 Units into the skin 2 (two) times daily. 20 units in morning and 10 units in evening (may add additional 20 units in evening depending on blood sugar)     losartan 25 MG tablet  Commonly known as:  COZAAR  Take 25 mg by mouth daily.     oxyCODONE 5 MG immediate release tablet  Commonly known as:  Oxy IR/ROXICODONE  Take 1-2 tablets (5-10 mg total) by mouth every 3 (three) hours as needed for breakthrough pain.     pravastatin 40 MG tablet  Commonly known as:  PRAVACHOL  Take 40 mg by mouth at bedtime.     tiotropium 18 MCG inhalation capsule  Commonly known as:  SPIRIVA  Place 18 mcg into inhaler and inhale daily.        Diagnostic Studies: Dg Tibia/fibula Left  07/02/2015  CLINICAL DATA:  ORIF of left tibia. EXAM: LEFT TIBIA AND FIBULA - 2 VIEW COMPARISON:  07/02/2015 FINDINGS: There has been interval open reduction and internal fixation of the proximal left tibial plateau fracture. The hardware components and fracture  fragments are in anatomic alignment. IMPRESSION: 1. Status post ORIF of proximal tibial fracture. Electronically Signed   By: Signa Kell M.D.   On: 07/02/2015 17:33   Ct Knee Left Wo Contrast  07/02/2015  CLINICAL DATA:  Felt left knee pop, with pain. Assess tibial plateau fracture. Initial encounter. EXAM: CT OF THE LEFT KNEE WITHOUT CONTRAST TECHNIQUE: Multidetector CT imaging of the left knee was performed according to the standard protocol. Multiplanar CT image reconstructions were also generated. COMPARISON:  Left knee radiographs performed earlier today at 2:35 a.m. FINDINGS: There is a comminuted fracture of the lateral tibial plateau, reflecting a Schatzker type 2, or AO/OTA type B3, fracture. 3-4 mm of depression is noted along the lateral tibial plateau. Fracture lines extend to the tibial spine, including the medial aspect of the tibial spine. Fracture lines extend along the tibial metadiaphysis, without evidence of complete dissociation. There appears to be a nondisplaced fracture of the fibular head. A relatively large lipohemarthrosis is noted. There is mild atrophy of some of the visualized musculature. No definite vascular abnormalities are seen, though evaluation of the vasculature is limited without contrast. Mild soft tissue injury is noted about the knee. The anterior and posterior cruciate ligaments appear grossly intact. The medial collateral ligament and lateral collateral ligament complex are grossly unremarkable in appearance, though difficult to fully assess. The menisci are not well characterized. The quadriceps and patellar tendons remain intact. IMPRESSION: 1. Comminuted fracture of the lateral tibial plateau, reflecting a Schatzker type 2, or AO/OTA type B3 fracture. 3-4 mm of depression along the lateral tibial plateau. Fracture lines extend to the tibial spine, including the medial aspect of the tibial spine. 2. Nondisplaced fracture of the fibular head. 3. Relatively large  lipohemarthrosis noted. Electronically Signed   By: Roanna Raider M.D.   On: 07/02/2015 05:40   Ct 3d Independent Annabell Sabal  07/02/2015  CLINICAL DATA:  Nonspecific (abnormal) findings on radiological and other examination of musculoskeletal sysem. EXAM: 3-DIMENSIONAL CT IMAGE RENDERING ON INDEPENDENT WORKSTATION TECHNIQUE: 3-dimensional CT images were rendered by post-processing of the original CT data on an independent workstation. The 3-dimensional CT images were interpreted and findings were reported in the accompanying complete CT report for this study COMPARISON:  CT scan, same date. FINDINGS: 3D images were performed at the independent workstation to help and preoperative assessment. There is a mildly depressed die punch type lateral tibial plateau fracture with impaction and comminution. Please see 3D images. IMPRESSION: Comminuted and mildly depressed lateral tibial plateau fracture. Electronically Signed   By: Rudie Meyer M.D.   On: 07/02/2015 13:54   Chest Portable 1 View  07/02/2015  CLINICAL  DATA:  Preoperative chest radiograph.  Initial encounter. EXAM: PORTABLE CHEST 1 VIEW COMPARISON:  Chest radiograph performed 05/28/2015 FINDINGS: The lungs are well-aerated. Vascular congestion is noted. Chronically increased interstitial markings are noted. There is no evidence of pleural effusion or pneumothorax. The cardiomediastinal silhouette is borderline normal in size. No acute osseous abnormalities are seen. IMPRESSION: Vascular congestion noted. Chronically increased interstitial markings seen. Electronically Signed   By: Roanna RaiderJeffery  Chang M.D.   On: 07/02/2015 05:30   Dg Knee Complete 4 Views Left  07/02/2015  CLINICAL DATA:  50 year old female with left knee trauma and pain EXAM: LEFT KNEE - COMPLETE 4+ VIEW COMPARISON:  Left knee radiograph dated 04/21/2005 FINDINGS: There is comminuted appearing fracture of the lateral aspect of the proximal tibial metaphysis with extension of the fracture line to  the lateral tibial plateau. There is associated mild depression of the lateral tibial plateau and widening of the lateral knee compartment concerning for underlying ligamentous and/or meniscal injury. No other fracture identified. The bones are osteopenic. There is a suprapatellar joint effusion. There is mild soft tissue swelling of the knee. No radiopaque foreign object. IMPRESSION: Comminuted appearing depressed fracture of the lateral tibial plateau with widening of the lateral knee compartment. Findings concerning for underlying meniscal/ligamentous injury. Orthopedic consult is advised. Electronically Signed   By: Elgie CollardArash  Radparvar M.D.   On: 07/02/2015 02:59   Dg Knee Left Port  07/02/2015  CLINICAL DATA:  Left tibial plateau fracture. Postop. Initial encounter. EXAM: PORTABLE LEFT KNEE - 1-2 VIEW COMPARISON:  07/02/2015 FINDINGS: Lateral fixation plate and screws are seen across the tibial metaphysis. Fracture fragments are in near anatomic alignment. Skin staples and external knee brace are seen in place which limits fine bone detail. IMPRESSION: Internal fixation of tibial plateau fractures in near anatomic alignment. Electronically Signed   By: Myles RosenthalJohn  Stahl M.D.   On: 07/02/2015 19:24   Dg C-arm 61-120 Min  07/02/2015  CLINICAL DATA:  ORIF of left tib fib EXAM: DG C-ARM 61-120 MIN COMPARISON:  Earlier today FINDINGS: Sideplate and screw device reduces the proximal tibial plateau fracture. The hardware components and fracture fragments are in anatomic alignment. IMPRESSION: 1. Status post ORIF of proximal tibial plateau fracture. Electronically Signed   By: Signa Kellaylor  Stroud M.D.   On: 07/02/2015 17:34    Disposition: 01-Home or Self Care      Discharge Instructions    Call MD / Call 911    Complete by:  As directed   If you experience chest pain or shortness of breath, CALL 911 and be transported to the hospital emergency room.  If you develope a fever above 101 F, pus (white drainage) or  increased drainage or redness at the wound, or calf pain, call your surgeon's office.     Constipation Prevention    Complete by:  As directed   Drink plenty of fluids.  Prune juice may be helpful.  You may use a stool softener, such as Colace (over the counter) 100 mg twice a day.  Use MiraLax (over the counter) for constipation as needed.     Diet - low sodium heart healthy    Complete by:  As directed      Discharge instructions    Complete by:  As directed   Patient must remain nonweightbearing on the left lower extremity. She should continue to elevate the left lower extremity at all times. She will continue using the Polar Care. She will keep her dressing on and the hinge  knee brace in place until follow-up in 7-10 days. She will use a walker for assistance with ambulation short distances. She'll use a wheelchair with a left lower leg support for longer distances.     Driving restrictions    Complete by:  As directed   No driving for 9-60 weeks     Increase activity slowly as tolerated    Complete by:  As directed      Lifting restrictions    Complete by:  As directed   No lifting for 12-16 weeks          The patient will be discharged home today with home health. She has prescriptions for a rolling walker, bedside commode and wheelchair with a left leg support. She'll follow-up in my office in 7-10 days.    Signed: Juanell Fairly ,MD 07/04/2015, 12:45 PM

## 2015-07-04 NOTE — Progress Notes (Signed)
Occupational Therapy Treatment Patient Details Name: GIANNAMARIE PAULUS MRN: [id number] DOB: 21-Dec-1965 Today's Date: 07/04/2015    History of present illness Pt is a 50 y.o. female with PMH of diabetes, HTN, hepatitis C and bleeding disorder.  Pt presented with L tibial plateau and L fibular head fracture from mechanical fall.  Pt is s/p ORIF L tibial plateau (07-02-15).   OT comments  Pt seen for instruction in LB dressing skills with min assist and using bedside extra wide commode seat with FWW and non WB LLE with min assist and independent for hygiene after urinating.  Assisted patient into wheelchair with min assist hopping for non WB.  Adjusted leg rest on L to accommodate leg being in extension with knee brace in place and demonstrated to patient how to put leg rests on and off. Pt making good progress with OT and does not need any further OT services at home.  Reviewed safe set up at home to prevent falls.  All goals met and pt ready to go home today.   Follow Up Recommendations  No OT follow up    Equipment Recommendations  Tub/shower bench;3 in 1 bedside comode    Recommendations for Other Services      Precautions / Restrictions Precautions Precautions: Fall Restrictions Weight Bearing Restrictions: Yes Other Position/Activity Restrictions: NWB L LE        Mobility Bed Mobility                  Transfers                      Balance                                   ADL Overall ADL's : Needs assistance/impaired                     Lower Body Dressing: Minimal assistance;With adaptive equipment;Sit to/from stand (with non WB LLE)                 General ADL Comments: Training in reacher, sock aid and tolieting using commode seat and FWW with non WB LLE.        Vision                     Perception     Praxis      Cognition   Behavior During Therapy: WFL for tasks assessed/performed Overall Cognitive  Status: Within Functional Limits for tasks assessed                       Extremity/Trunk Assessment               Exercises     Shoulder Instructions       General Comments      Pertinent Vitals/ Pain       Pain Assessment: 0-10 Pain Score: 4  Pain Location: L LE Pain Descriptors / Indicators: Aching Pain Intervention(s): Limited activity within patient's tolerance;Monitored during session;Premedicated before session  Home Living                                          Prior Functioning/Environment  Frequency Min 1X/week     Progress Toward Goals  OT Goals(current goals can now be found in the care plan section)  Progress towards OT goals: Progressing toward goals  Acute Rehab OT Goals Patient Stated Goal: to go home OT Goal Formulation: With patient Time For Goal Achievement: 07/17/15 Potential to Achieve Goals: Good  Plan Discharge plan remains appropriate    Co-evaluation                 End of Session Equipment Utilized During Treatment: Rolling walker;Left knee immobilizer   Activity Tolerance Patient limited by pain   Patient Left Other (comment) (pt assisted into wc with leg rests adjusted with phone in reach to call NSG if needed)   Nurse Communication  (pt ready to go home and in wc after using commode)        Time: 1445-1530 OT Time Calculation (min): 45 min  Charges: OT General Charges $OT Visit: 1 Procedure OT Treatments $Self Care/Home Management : 38-52 mins  Wofford,Susan 07/04/2015, 4:08 PM   Chrys Racer, OTR/L ascom [phone]

## 2015-07-04 NOTE — Progress Notes (Signed)
Physical Therapy Treatment Patient Details Name: [name] MRN: [id number] DOB: [birth year] Today's Date: 07/04/2015    History of Present Illness Pt is a 50 y.o. female with PMH of diabetes, HTN, hepatitis C and bleeding disorder.  Pt presented with L tibial plateau and L fibular head fracture from mechanical fall.  Pt is s/p ORIF L tibial plateau (07-02-15).    PT Comments    Pt was min assist for bed mobility for L LE navigation.  Pt was min assist for sit to stand with RW.  Pt ambulated 20 feet with RW CGA and x1 equipment assist for chair following. [facility].  Session was limited due to fatigue and pain.  Due to pt's inability to gain in ambulation distance, it is recommended that pt receive bedside commode and manual WC equipment to facilitate safe mobility within the home. Due to pt's assist levels within session, it is recommended that pt have 24 hour assist within the home.  Pt reports that she does have 24/7 assist from adult family members.    Follow Up Recommendations  Home health PT;Supervision/Assistance - 24 hour (Due to insurance's inability to fund home health PT per care management, pt [facility]     Equipment Recommendations  Rolling walker with 5" wheels;Wheelchair (measurements PT);3in1 (PT) (manual WC requires elevated Leg rests due to L LE breg knee brace locked in extension.)    Recommendations for Other Services       Precautions / Restrictions Precautions Precautions: Fall Restrictions Weight Bearing Restrictions: Yes Other Position/Activity Restrictions: NWB L LE     Mobility  Bed Mobility Overal bed mobility: Needs Assistance Bed Mobility: Rolling;Sidelying to Sit Rolling: Min assist Sidelying to sit: Min assist       General bed mobility comments: Increased time, min assist for L LE.  Transfers Overall transfer level: Needs assistance Equipment used: Rolling walker (2  wheeled) Transfers: Sit to/from Stand Sit to Stand: Min assist         General transfer comment: increased time   Ambulation/Gait Ambulation/Gait assistance: Min guard Ambulation Distance (Feet): 20 Feet Assistive device: Rolling walker (2 wheeled)   Gait velocity: decreased    General Gait Details: NWB on L LE, performed hopping strategy for R LE, increased effort ; steady with hopping     Stairs            Wheelchair Mobility    Modified Rankin (Stroke Patients Only)       Balance Overall balance assessment: Needs assistance Sitting-balance support: Feet supported;Single extremity supported (Bed rail ) Sitting balance-Leahy Scale: Fair     Standing balance support: Bilateral upper extremity supported (RW ) Standing balance-Leahy Scale: Fair                      Cognition Arousal/Alertness: Awake/alert Behavior During Therapy: WFL for tasks assessed/performed Overall Cognitive Status: Within Functional Limits for tasks assessed                      Exercises      General Comments  Nursing was contacted and cleared pt for physical therapy.  Pt was agreeable and session was modified due to fatigue and pain.  Nursing was present for gait observation.  Care management was notified of pt's equipment and discharge needs.       Pertinent Vitals/Pain Pain Assessment: 0-10 Pain Score: 10-Worst pain ever Pain  Location: L knee  Pain Descriptors / Indicators: Aching;Crushing;Operative site guarding;Grimacing;Constant Pain Intervention(s): Limited activity within patient's tolerance;Monitored during session;Ice applied;Repositioned  See flow sheet for vitals.     Home Living                      Prior Function            PT Goals (current goals can now be found in the care plan section) Acute Rehab PT Goals Patient Stated Goal: to go home PT Goal Formulation: With patient Time For Goal Achievement: 07/17/15 Potential to  Achieve Goals: Good Additional Goals Additional Goal #1: Pt will be able to navigate L LE with no assistance for bed mobility. Progress towards PT goals: Progressing toward goals    Frequency  BID    PT Plan Current plan remains appropriate    Co-evaluation             End of Session Equipment Utilized During Treatment: Gait belt Activity Tolerance: Patient limited by fatigue;Patient limited by pain Patient left: in chair;with call bell/phone within reach;with chair alarm set;with SCD's reapplied (Pillow under L LE and polarcare activated )     Time: 0454-0981 PT Time Calculation (min) (ACUTE ONLY): 40 min  Charges:                       G Codes:      Lyndel Safe, SPT Lyndel Safe 07/04/2015, 12:11 PM

## 2015-07-04 NOTE — Progress Notes (Signed)
Subjective:  POD #2  S/p ORIF left lateral tibial plateau.  Patient reports pain as moderate.  She can sitting up in bed eating lunch. She has no shortness of breath, chest pain or abdominal pain.  Objective:   VITALS:   Filed Vitals:   07/03/15 1917 07/04/15 0330 07/04/15 0736 07/04/15 1124  BP: 137/77 122/70 132/71   Pulse: 106 103 105 114  Temp: 98.7 F (37.1 C) 98.8 F (37.1 C) 98.7 F (37.1 C)   TempSrc: Oral Oral Oral   Resp: 18 18 19    Height:      Weight:      SpO2: 97% 89% 92% 98%    PHYSICAL EXAM:  Left lower extremity: Patient's dressing is clean dry and intact. Polar Care is in place along with a hinged knee brace locked in extension.  he has intact sensation light touch throughout the left lower extremity. She has palpable pedal pulses and intact motor function including the ability to flex and extend her toes and dorsiflex and plantarflex her ankle. She demonstrated no signs of compartment syndrome.   LABS  Results for orders placed or performed during the hospital encounter of 07/02/15 (from the past 24 hour(s))  CBC     Status: Abnormal   Collection Time: 07/03/15  1:42 PM  Result Value Ref Range   WBC 10.2 3.6 - 11.0 K/uL   RBC 4.34 3.80 - 5.20 MIL/uL   Hemoglobin 14.2 12.0 - 16.0 g/dL   HCT 40.943.4 81.135.0 - 91.447.0 %   MCV 100.0 80.0 - 100.0 fL   MCH 32.8 26.0 - 34.0 pg   MCHC 32.8 32.0 - 36.0 g/dL   RDW 78.214.6 (H) 95.611.5 - 21.314.5 %   Platelets 74 (L) 150 - 440 K/uL  Basic metabolic panel     Status: Abnormal   Collection Time: 07/03/15  1:42 PM  Result Value Ref Range   Sodium 136 135 - 145 mmol/L   Potassium 4.0 3.5 - 5.1 mmol/L   Chloride 102 101 - 111 mmol/L   CO2 33 (H) 22 - 32 mmol/L   Glucose, Bld 168 (H) 65 - 99 mg/dL   BUN 12 6 - 20 mg/dL   Creatinine, Ser 0.860.74 0.44 - 1.00 mg/dL   Calcium 7.3 (L) 8.9 - 10.3 mg/dL   GFR calc non Af Amer >60 >60 mL/min   GFR calc Af Amer >60 >60 mL/min   Anion gap 1 (L) 5 - 15  Glucose, capillary     Status:  Abnormal   Collection Time: 07/03/15  3:43 PM  Result Value Ref Range   Glucose-Capillary 122 (H) 65 - 99 mg/dL  Glucose, capillary     Status: Abnormal   Collection Time: 07/03/15  9:08 PM  Result Value Ref Range   Glucose-Capillary 130 (H) 65 - 99 mg/dL   Comment 1 Notify RN   Glucose, capillary     Status: Abnormal   Collection Time: 07/04/15  7:34 AM  Result Value Ref Range   Glucose-Capillary 141 (H) 65 - 99 mg/dL  CBC     Status: Abnormal   Collection Time: 07/04/15 10:44 AM  Result Value Ref Range   WBC 10.4 3.6 - 11.0 K/uL   RBC 4.04 3.80 - 5.20 MIL/uL   Hemoglobin 13.6 12.0 - 16.0 g/dL   HCT 57.840.7 46.935.0 - 62.947.0 %   MCV 100.7 (H) 80.0 - 100.0 fL   MCH 33.5 26.0 - 34.0 pg   MCHC 33.3 32.0 - 36.0 g/dL  RDW 14.4 11.5 - 14.5 %   Platelets 71 (L) 150 - 440 K/uL    Dg Tibia/fibula Left  07/02/2015  CLINICAL DATA:  ORIF of left tibia. EXAM: LEFT TIBIA AND FIBULA - 2 VIEW COMPARISON:  07/02/2015 FINDINGS: There has been interval open reduction and internal fixation of the proximal left tibial plateau fracture. The hardware components and fracture fragments are in anatomic alignment. IMPRESSION: 1. Status post ORIF of proximal tibial fracture. Electronically Signed   By: Signa Kell M.D.   On: 07/02/2015 17:33   Ct 3d Independent Annabell Sabal  07/02/2015  CLINICAL DATA:  Nonspecific (abnormal) findings on radiological and other examination of musculoskeletal sysem. EXAM: 3-DIMENSIONAL CT IMAGE RENDERING ON INDEPENDENT WORKSTATION TECHNIQUE: 3-dimensional CT images were rendered by post-processing of the original CT data on an independent workstation. The 3-dimensional CT images were interpreted and findings were reported in the accompanying complete CT report for this study COMPARISON:  CT scan, same date. FINDINGS: 3D images were performed at the independent workstation to help and preoperative assessment. There is a mildly depressed die punch type lateral tibial plateau fracture with impaction  and comminution. Please see 3D images. IMPRESSION: Comminuted and mildly depressed lateral tibial plateau fracture. Electronically Signed   By: Rudie Meyer M.D.   On: 07/02/2015 13:54   Dg Knee Left Port  07/02/2015  CLINICAL DATA:  Left tibial plateau fracture. Postop. Initial encounter. EXAM: PORTABLE LEFT KNEE - 1-2 VIEW COMPARISON:  07/02/2015 FINDINGS: Lateral fixation plate and screws are seen across the tibial metaphysis. Fracture fragments are in near anatomic alignment. Skin staples and external knee brace are seen in place which limits fine bone detail. IMPRESSION: Internal fixation of tibial plateau fractures in near anatomic alignment. Electronically Signed   By: Myles Rosenthal M.D.   On: 07/02/2015 19:24   Dg C-arm 61-120 Min  07/02/2015  CLINICAL DATA:  ORIF of left tib fib EXAM: DG C-ARM 61-120 MIN COMPARISON:  Earlier today FINDINGS: Sideplate and screw device reduces the proximal tibial plateau fracture. The hardware components and fracture fragments are in anatomic alignment. IMPRESSION: 1. Status post ORIF of proximal tibial plateau fracture. Electronically Signed   By: Signa Kell M.D.   On: 07/02/2015 17:34    Assessment/Plan: 2 Days Post-Op   Active Problems:   Tibial plateau fracture   patient continues to complain of pain in the left knee. This is slowly improving. Patient is making progress with physical therapy. Plan is for discharge home today with home health. She is ordered for a rolling walker, bedside commode and wheelchair with a left leg support. She'll follow up with me in 7-10 days. She must remain nonweightbearing on the left lower extremity until follow-up.     Juanell Fairly , MD 07/04/2015, 12:37 PM

## 2015-07-04 NOTE — Discharge Planning (Signed)
Pt IV removed.  Pt DC papers given, explained and educated.  Pt told of suggested FU appt and appt made for pt.  Pt also given scripts to take to pharm.  Pt received home BSC and WC before leaving.  RN assessment and VSS revealing stability fro DC to home.  Pt will be wheeled to front and family transporting home via car.

## 2015-07-04 NOTE — Care Management Note (Signed)
Case Management Note  Patient Details  Name: [name] MRN: [id number] Date of Birth: [birth year]  Subjective/Objective:       Discharge to home today after her wheelchair with leg-lift is delivered to her hospital room today [facility]. [name] already has [name] rolling walker and [name] bedside commode in her room to take home with her provided by [facility]. [name] [facility] and [facility] to schedule an appointment this week.  [name] is only able to ambulate approximately 20 feet with [name] rolling walker with assistance today. She requires [name] wheelchair for [facility] and from her car to her doctor appointments to prevent another fall.              Action/Plan:   Expected Discharge Date:  07/04/15               Expected Discharge Plan:     In-House Referral:     Discharge planning Services     Post Acute Care Choice:    Choice offered to:     DME Arranged:    DME Agency:     HH Arranged:    HH Agency:     Status of Service:     Medicare Important Message Given:    Date Medicare IM Given:    Medicare IM give by:    Date Additional Medicare IM Given:    Additional Medicare Important Message give by:     If discussed at Long Length of Stay Meetings, dates discussed:    Additional Comments:  Brittany Ahart A, RN 07/04/2015, 1:48 PM

## 2015-07-05 LAB — GLUCOSE, CAPILLARY: Glucose-Capillary: 130 mg/dL — ABNORMAL HIGH (ref 65–99)

## 2015-07-25 ENCOUNTER — Encounter: Payer: Medicaid Other | Attending: Internal Medicine | Admitting: Internal Medicine

## 2015-07-25 DIAGNOSIS — T8131XA Disruption of external operation (surgical) wound, not elsewhere classified, initial encounter: Secondary | ICD-10-CM | POA: Insufficient documentation

## 2015-07-25 DIAGNOSIS — E11622 Type 2 diabetes mellitus with other skin ulcer: Secondary | ICD-10-CM | POA: Insufficient documentation

## 2015-07-25 DIAGNOSIS — S82102A Unspecified fracture of upper end of left tibia, initial encounter for closed fracture: Secondary | ICD-10-CM | POA: Insufficient documentation

## 2015-07-25 DIAGNOSIS — Z794 Long term (current) use of insulin: Secondary | ICD-10-CM | POA: Insufficient documentation

## 2015-07-25 DIAGNOSIS — J449 Chronic obstructive pulmonary disease, unspecified: Secondary | ICD-10-CM | POA: Insufficient documentation

## 2015-07-25 DIAGNOSIS — Y839 Surgical procedure, unspecified as the cause of abnormal reaction of the patient, or of later complication, without mention of misadventure at the time of the procedure: Secondary | ICD-10-CM | POA: Diagnosis not present

## 2015-07-25 DIAGNOSIS — F17200 Nicotine dependence, unspecified, uncomplicated: Secondary | ICD-10-CM | POA: Insufficient documentation

## 2015-07-25 DIAGNOSIS — I10 Essential (primary) hypertension: Secondary | ICD-10-CM | POA: Insufficient documentation

## 2015-07-25 DIAGNOSIS — E669 Obesity, unspecified: Secondary | ICD-10-CM | POA: Insufficient documentation

## 2015-07-26 ENCOUNTER — Other Ambulatory Visit
Admission: RE | Admit: 2015-07-26 | Discharge: 2015-07-26 | Disposition: A | Discharge status: Home / Self Care | Payer: Medicaid Other | Source: Other Acute Inpatient Hospital | Attending: Internal Medicine | Admitting: Internal Medicine

## 2015-07-26 DIAGNOSIS — S81802A Unspecified open wound, left lower leg, initial encounter: Secondary | ICD-10-CM | POA: Insufficient documentation

## 2015-07-26 NOTE — Progress Notes (Signed)
[name] ([id number]) Visit Report for 07/25/2015 Chief Complaint Document Details Patient Name: [name], [name] Date of Service: 07/25/2015 2:00 PM Medical Record Patient Account Number: [id number] Number: Treating RN: Curtis Sites 02/18/1966 (50 y.o. Other Clinician: Date of Birth/Sex: Female) Treating ROBSON, MICHAEL Primary Care Physician/Extender: Jodi Marble, Harriett Physician: Referring Physician: Joeseph Amor in Treatment: 0 Information Obtained from: Patient Chief Complaint Patient arrives here acutely referred by her orthopedic surgeon Dr. Martha Clan for a nonhealing surgical wound on the left lateral leg Electronic Signature(s) Signed: 07/25/2015 6:33:23 PM By: Baltazar Najjar MD Entered By: Baltazar Najjar on 07/25/2015 17:31:57 [name] ([id number]) -------------------------------------------------------------------------------- Debridement Details Patient Name: Brittany Low A. Date of Service: 07/25/2015 2:00 PM Medical Record Patient Account Number: [id number] Number: Treating RN: Curtis Sites 1965-10-05 (50 y.o. Other Clinician: Date of Birth/Sex: Female) Treating ROBSON, MICHAEL Primary Care Physician/Extender: Jodi Marble, Harriett Physician: Referring Physician: Joeseph Amor in Treatment: 0 Debridement Performed for Wound #1 Left,Proximal,Lateral Lower Leg Assessment: Performed By: Physician Maxwell Caul, MD Debridement: Debridement Pre-procedure Yes Verification/Time Out Taken: Start Time: 15:14 Pain Control: Lidocaine 4% Topical Solution Level: Skin/Subcutaneous Tissue Total Area Debrided (L x 3.9 (cm) x 4 (cm) = 15.6 (cm) W): Tissue and other Non-Viable, Blood Clots, Eschar, Fibrin/Slough material debrided: Instrument: Blade, Curette, Forceps Bleeding: Minimum Hemostasis Achieved: Pressure End Time: 15:22 Procedural Pain: 0 Post Procedural Pain: 0 Response to Treatment:  Procedure was tolerated well Post Debridement Measurements of Total Wound Length: (cm) 3.9 Width: (cm) 4 Depth: (cm) 3 Volume: (cm) 36.757 Post Procedure Diagnosis fu thickness with exposed support structures Electronic Signature(s) Signed: 07/25/2015 6:11:08 PM By: Curtis Sites Signed: 07/25/2015 6:33:23 PM By: Baltazar Najjar MD Entered By: Curtis Sites on 07/25/2015 15:27:39 [name], [name] ([id number]) [name], [name] ([id number]) -------------------------------------------------------------------------------- HPI Details Patient Name: Brittany Low A. Date of Service: 07/25/2015 2:00 PM Medical Record Patient Account Number: [id number] Number: Treating RN: Curtis Sites [birth year] (50 y.o. Other Clinician: Date of Birth/Sex: Female) Treating ROBSON, MICHAEL Primary Care Physician/Extender: Jodi Marble, Harriett Physician: Referring Physician: Joeseph Amor in Treatment: 0 History of Present Illness HPI Description: 07/25/15; this is a patient who twisted her left leg earlier this month. She felt a popping sound and extreme pain. She was admitted to the hospital with a left lateral tibial plateau fracture and underwent an ORIF on March 5. She was in the hospital for 3 days. And then discharged home. She was on Lovenox 40 mg every 12 hours. She told me she is on insulin for her diabetes although I don't exactly see that on her drug list. I think the Lovenox is for DVT prophylaxis and that really doesn't need to be every 12 hours I think we can cut that down to once a day. Unfortunately she had already left the clinic by the time I noticed this. In any case she apparently was doing fairly well. Her staples were taken out at her orthopedic surgeon's office on March 20. She went to see her orthopedic surgeon yesterday and unfortunately she had a dehisced since in the middle of the surgical wound with a clot. She also has 2 small areas inferior to  this on the surgical line that are concerning. Augmentin and Septra were prescribed by Dr. Martha Clan. She hasn't yet started them but has the medications. She has no known history of diabetic neuropathy or PAD Electronic Signature(s) Signed: 07/25/2015 6:33:23 PM By: Baltazar Najjar MD Entered By: Baltazar Najjar on 07/25/2015 17:48:13 [name],  [name] ([id number]) -------------------------------------------------------------------------------- Physical Exam Details Patient Name: [name], [name] Date of Service: 07/25/2015 2:00 PM Medical Record Patient Account Number: [id number] Number: Treating RN: Curtis Sites 1965-12-23 (50 y.o. Other Clinician: Date of Birth/Sex: Female) Treating ROBSON, MICHAEL Primary Care Physician/Extender: Jodi Marble, Harriett Physician: Referring Physician: Joeseph Amor in Treatment: 0 Constitutional Sitting or standing Blood Pressure is within target range for patient.. Pulse regular and within target range for patient.Marland [name] Respirations regular, non-labored and within target range.. Temperature is normal and within the target range for the patient.. Patient's appearance is neat and clean. Appears in no acute distress. Well nourished and well developed.. Eyes Conjunctivae clear. No discharge.. Ears, Nose, Mouth, and Throat Oropharynx within normal limits, without erythema, exudate or ulceration.. Cardiovascular Heart rhythm and rate regular, without murmur or gallop.. Femoral arteries without bruits and pulses strong.. Pedal pulses palpable and strong bilaterally.. Extremities are free of varicosities, clubbing or edema. Peripheral pulses strong and equal. Capillary refill < 3 seconds.. Gastrointestinal (GI) Obese no palpable liver or spleen no tenderness. No liver or spleen enlargement or tenderness.. Genitourinary (GU) Bladder without fullness, masses or tenderness.. Lymphatic No nodes in the popliteal inguinal  areas.. Musculoskeletal I suspect she is had ligamentous injury in the left knee with instability of the medial and lateral joints. She has a brace but is concerned that part of this brace may have caused the pressure over her wound area. She is nonweightbearing. Integumentary (Hair, Skin) Other than the wound there are no other skin issues. Neurological Sensation normal to touch, pin, and vibration. Sensation intact to 10 gram monofilament in extremities.Marland [name] Psychiatric Patient appears depressed today. I think this is situational and appropriate. Notes Wound exam; when we took her dressing off the wound was filled with liquefied jelled blood clot. I removed all of this with a curet. There was some necrotic tissue superiorly which I debridement with pickups and Shein, [name] ([id number]) scalpel. The base of the tissue doesn't look so bad however there was an odor and there is an exposed screw. Electronic Signature(s) Signed: 07/25/2015 6:33:23 PM By: Baltazar Najjar MD Entered By: Baltazar Najjar on 07/25/2015 17:54:40 [name], [name] ([id number]) -------------------------------------------------------------------------------- Physician Orders Details Patient Name: Brittany Low A. Date of Service: 07/25/2015 2:00 PM Medical Record Patient Account Number: [id number] Number: Treating RN: Curtis Sites February 25, 1966 (50 y.o. Other Clinician: Date of Birth/Sex: Female) Treating ROBSON, MICHAEL Primary Care Physician/Extender: Jodi Marble, Harriett Physician: Referring Physician: Joeseph Amor in Treatment: 0 Verbal / Phone Orders: Yes Clinician: Curtis Sites Read Back and Verified: Yes Diagnosis Coding Wound Cleansing Wound #1 Left,Proximal,Lateral Lower Leg o Clean wound with Normal Saline. Wound #2 Left,Distal,Lateral Lower Leg o Clean wound with Normal Saline. Anesthetic Wound #1 Left,Proximal,Lateral Lower Leg o Topical Lidocaine 4% cream  applied to wound bed prior to debridement Wound #2 Left,Distal,Lateral Lower Leg o Topical Lidocaine 4% cream applied to wound bed prior to debridement Skin Barriers/Peri-Wound Care Wound #1 Left,Proximal,Lateral Lower Leg o Skin Prep Wound #2 Left,Distal,Lateral Lower Leg o Skin Prep Primary Wound Dressing Wound #1 Left,Proximal,Lateral Lower Leg o Prisma Ag - or equivalent and lightly pack over silver collagen dressing with saline moistened gauze Wound #2 Left,Distal,Lateral Lower Leg o Prisma Ag - or equivalent Secondary Dressing Wound #1 Left,Proximal,Lateral Lower Leg o ABD and Kerlix/Conform Wound #2 Left,Distal,Lateral Lower Leg [name], [name] ([id number]) o ABD and Kerlix/Conform Dressing Change Frequency Wound #1 Left,Proximal,Lateral Lower Leg o Change dressing every other day. Wound #2 Left,Distal,Lateral Lower  Leg o Change dressing every other day. Follow-up Appointments Wound #1 Left,Proximal,Lateral Lower Leg o Return Appointment in 1 week. Wound #2 Left,Distal,Lateral Lower Leg o Return Appointment in 1 week. Home Health Wound #1 Left,Proximal,Lateral Lower Leg o Initiate Home Health for Skilled Nursing o Home Health Nurse may visit PRN to address patientos wound care needs. o FACE TO FACE ENCOUNTER: MEDICARE and MEDICAID PATIENTS: I certify that this patient is under my care and that I had a face-to-face encounter that meets the physician face-to-face encounter requirements with this patient on this date. The encounter with the patient was in whole or in part for the following MEDICAL CONDITION: (primary reason for Home Healthcare) MEDICAL NECESSITY: I certify, that based on my findings, NURSING services are a medically necessary home health service. HOME BOUND STATUS: I certify that my clinical findings support that this patient is homebound (i.e., Due to illness or injury, pt requires aid of supportive devices such as crutches,  cane, wheelchairs, walkers, the use of special transportation or the assistance of another person to leave their place of residence. There is a normal inability to leave the home and doing so requires considerable and taxing effort. Other absences are for medical reasons / religious services and are infrequent or of short duration when for other reasons). o If current dressing causes regression in wound condition, may D/C ordered dressing product/s and apply Normal Saline Moist Dressing daily until next Wound Healing Center / Other MD appointment. Notify Wound Healing Center of regression in wound condition at [phone]. o Please direct any NON-WOUND related issues/requests for orders to patient's Primary Care Physician Wound #2 Left,Distal,Lateral Lower Leg o Initiate Home Health for Skilled Nursing o Home Health Nurse may visit PRN to address patientos wound care needs. o FACE TO FACE ENCOUNTER: MEDICARE and MEDICAID PATIENTS: I certify that this patient is under my care and that I had a face-to-face encounter that meets the physician face-to-face encounter requirements with this patient on this date. The encounter with the patient was in whole or in part for the following MEDICAL CONDITION: (primary reason for Home Healthcare) MEDICAL NECESSITY: I certify, that based on my findings, NURSING services are a medically necessary home health service. HOME BOUND STATUS: I certify that my clinical findings support that this patient is homebound (i.e., Due to illness or injury, pt requires aid of Tramontana, Gunhild A. ([id number]) supportive devices such as crutches, cane, wheelchairs, walkers, the use of special transportation or the assistance of another person to leave their place of residence. There is a normal inability to leave the home and doing so requires considerable and taxing effort. Other absences are for medical reasons / religious services and are infrequent or of short  duration when for other reasons). o If current dressing causes regression in wound condition, may D/C ordered dressing product/s and apply Normal Saline Moist Dressing daily until next Wound Healing Center / Other MD appointment. Notify Wound Healing Center of regression in wound condition at [phone]. o Please direct any NON-WOUND related issues/requests for orders to patient's Primary Care Physician Electronic Signature(s) Signed: 07/25/2015 6:11:08 PM By: Curtis Sites Signed: 07/25/2015 6:33:23 PM By: Baltazar Najjar MD Entered By: Curtis Sites on 07/25/2015 17:14:42 [name], [name] ([id number]) -------------------------------------------------------------------------------- Problem List Details Patient Name: Brittany Low A. Date of Service: 07/25/2015 2:00 PM Medical Record Patient Account Number: [id number] Number: Treating RN: Curtis Sites 10-Mar-1966 (50 y.o. Other Clinician: Date of Birth/Sex: Female) Treating ROBSON, MICHAEL Primary Care Physician/Extender: Jodi Marble, Harriett  Physician: Referring Physician: Joeseph Amor in Treatment: 0 Active Problems ICD-10 Encounter Code Description Active Date Diagnosis T81.31XA Disruption of external operation (surgical) wound, not 07/25/2015 Yes elsewhere classified, initial encounter S82.102A Unspecified fracture of upper end of left tibia, initial 07/25/2015 Yes encounter for closed fracture E11.622 Type 2 diabetes mellitus with other skin ulcer 07/25/2015 Yes Inactive Problems Resolved Problems Electronic Signature(s) Signed: 07/25/2015 6:33:23 PM By: Baltazar Najjar MD Entered By: Baltazar Najjar on 07/25/2015 17:31:19 [name], [name] ([id number]) -------------------------------------------------------------------------------- Progress Note Details Patient Name: Brittany Low A. Date of Service: 07/25/2015 2:00 PM Medical Record Patient Account Number:  [id number] Number: Treating RN: Curtis Sites 03/15/1966 (50 y.o. Other Clinician: Date of Birth/Sex: Female) Treating ROBSON, MICHAEL Primary Care Physician/Extender: Jodi Marble, Harriett Physician: Referring Physician: Joeseph Amor in Treatment: 0 Subjective Chief Complaint Information obtained from Patient Patient arrives here acutely referred by her orthopedic surgeon Dr. Martha Clan for a nonhealing surgical wound on the left lateral leg History of Present Illness (HPI) 07/25/15; this is a patient who twisted her left leg earlier this month. She felt a popping sound and extreme pain. She was admitted to the hospital with a left lateral tibial plateau fracture and underwent an ORIF on March 5. She was in the hospital for 3 days. And then discharged home. She was on Lovenox 40 mg every 12 hours. She told me she is on insulin for her diabetes although I don't exactly see that on her drug list. I think the Lovenox is for DVT prophylaxis and that really doesn't need to be every 12 hours I think we can cut that down to once a day. Unfortunately she had already left the clinic by the time I noticed this. In any case she apparently was doing fairly well. Her staples were taken out at her orthopedic surgeon's office on March 20. She went to see her orthopedic surgeon yesterday and unfortunately she had a dehisced since in the middle of the surgical wound with a clot. She also has 2 small areas inferior to this on the surgical line that are concerning. Augmentin and Septra were prescribed by Dr. Martha Clan. She hasn't yet started them but has the medications. She has no known history of diabetic neuropathy or PAD Wound History Patient presents with 1 open wound that has been present for approximately 3 weeks. Patient has been treating wound in the following manner: dry dressing. Laboratory tests have not been performed in the last month. Patient reportedly has not tested  positive for an antibiotic resistant organism. Patient reportedly has not tested positive for osteomyelitis. Patient reportedly has not had testing performed to evaluate circulation in the legs. Patient History Information obtained from Patient. Allergies No Known Allergies Family History Diabetes - Father, Mother, Siblings, Heart Disease - Father, Hypertension - Father, VALERI, [name] ([id number]) No family history of Cancer, Kidney Disease, Lung Disease, Seizures, Stroke, Thyroid Problems, Tuberculosis. Social History Current every day smoker, Marital Status - Married, Alcohol Use - Never, Drug Use - No History, Caffeine Use - Never. Medical History Respiratory Patient has history of Asthma, Chronic Obstructive Pulmonary Disease (COPD) Cardiovascular Patient has history of Hypertension Endocrine Patient has history of Type II Diabetes Patient is treated with Insulin. Blood sugar is not tested. Hospitalization/Surgery History - 07/03/2015, leg fracture. Review of Systems (ROS) Constitutional Symptoms (General Health) The patient has no complaints or symptoms. Eyes The patient has no complaints or symptoms. Ear/Nose/Mouth/Throat The patient has no complaints or symptoms. Hematologic/Lymphatic The patient has no  complaints or symptoms. Respiratory The patient has no complaints or symptoms. Cardiovascular The patient has no complaints or symptoms. Gastrointestinal The patient has no complaints or symptoms. Genitourinary The patient has no complaints or symptoms. Immunological The patient has no complaints or symptoms. Integumentary (Skin) The patient has no complaints or symptoms. Musculoskeletal The patient has no complaints or symptoms. Neurologic The patient has no complaints or symptoms. Oncologic The patient has no complaints or symptoms. Psychiatric The patient has no complaints or symptoms. [name], Jeanmarie A. ([id number]) Objective Constitutional Sitting  or standing Blood Pressure is within target range for patient.. Pulse regular and within target range for patient.Marland [name]. Respirations regular, non-labored and within target range.. Temperature is normal and within the target range for the patient.. Patient's appearance is neat and clean. Appears in no acute distress. Well nourished and well developed.. Vitals Time Taken: 2:22 PM, Height: 62 in, Source: Stated, Weight: 235 lbs, Source: Stated, BMI: 43, Temperature: 98.3 F, Pulse: 88 bpm, Respiratory Rate: 20 breaths/min, Blood Pressure: 109/70 mmHg. Eyes Conjunctivae clear. No discharge.. Ears, Nose, Mouth, and Throat Oropharynx within normal limits, without erythema, exudate or ulceration.. Cardiovascular Heart rhythm and rate regular, without murmur or gallop.. Femoral arteries without bruits and pulses strong.. Pedal pulses palpable and strong bilaterally.. Extremities are free of varicosities, clubbing or edema. Peripheral pulses strong and equal. Capillary refill < 3 seconds.. Gastrointestinal (GI) Obese no palpable liver or spleen no tenderness. No liver or spleen enlargement or tenderness.. Genitourinary (GU) Bladder without fullness, masses or tenderness.. Lymphatic No nodes in the popliteal inguinal areas.. Musculoskeletal I suspect she is had ligamentous injury in the left knee with instability of the medial and lateral joints. She has a brace but is concerned that part of this brace may have caused the pressure over her wound area. She is nonweightbearing. Neurological Sensation normal to touch, pin, and vibration. Sensation intact to 10 gram monofilament in extremities.Marland [name]. Psychiatric Patient appears depressed today. I think this is situational and appropriate. General Notes: Wound exam; when we took her dressing off the wound was filled with liquefied jelled blood clot. I removed all of this with a curet. There was some necrotic tissue superiorly which I debridement with pickups  and scalpel. The base of the tissue doesn't look so bad however there was an odor and there is an Danley, [place] A. ([id number]) exposed screw. Integumentary (Hair, Skin) Other than the wound there are no other skin issues. Wound #1 status is Open. Original cause of wound was Surgical Injury. The wound is located on the Left,Proximal,Lateral Lower Leg. The wound measures 3.9cm length x 4cm width x 1.6cm depth; 12.252cm^2 area and 19.604cm^3 volume. The wound is limited to skin breakdown. There is no tunneling or undermining noted. There is a large amount of sanguinous drainage noted. The wound margin is flat and intact. There is no granulation within the wound bed. There is a large (67-100%) amount of necrotic tissue within the wound bed including Eschar. The periwound skin appearance exhibited: Moist, Erythema. The periwound skin appearance did not exhibit: Callus, Crepitus, Excoriation, Fluctuance, Friable, Induration, Localized Edema, Rash, Scarring, Dry/Scaly, Maceration, Atrophie Blanche, Cyanosis, Ecchymosis, Hemosiderin Staining, Mottled, Pallor, Rubor. The surrounding wound skin color is noted with erythema which is circumferential. The periwound has tenderness on palpation. Wound #2 status is Open. Original cause of wound was Surgical Injury. The wound is located on the Left,Distal,Lateral Lower Leg. The wound measures 3.9cm length x 0.4cm width x 0.3cm depth; 1.225cm^2 area and 0.368cm^3 volume. The wound  is limited to skin breakdown. There is no tunneling or undermining noted. There is a small amount of sanguinous drainage noted. The wound margin is flat and intact. There is no granulation within the wound bed. There is a large (67-100%) amount of necrotic tissue within the wound bed including Eschar and Adherent Slough. The periwound skin appearance exhibited: Moist. The periwound skin appearance did not exhibit: Callus, Crepitus, Excoriation, Fluctuance, Friable,  Induration, Localized Edema, Rash, Scarring, Dry/Scaly, Maceration, Atrophie Blanche, Cyanosis, Ecchymosis, Hemosiderin Staining, Mottled, Pallor, Rubor, Erythema. The periwound has tenderness on palpation. Assessment Active Problems ICD-10 T81.31XA - Disruption of external operation (surgical) wound, not elsewhere classified, initial encounter S82.102A - Unspecified fracture of upper end of left tibia, initial encounter for closed fracture E11.622 - Type 2 diabetes mellitus with other skin ulcer Procedures Wound #1 Wound #1 is an Open Surgical Wound located on the Left,Proximal,Lateral Lower Leg . There was a Skin/Subcutaneous Tissue Debridement ([id number]) debridement with total area of 15.6 sq cm performed by Maxwell Caul, MD. with the following instrument(s): Blade, Curette, and Forceps to remove Non-Viable tissue/material including Blood Clots, Fibrin/Slough, and Eschar after achieving pain [name], [name] ([id number]) control using Lidocaine 4% Topical Solution. A time out was conducted prior to the start of the procedure. A Minimum amount of bleeding was controlled with Pressure. The procedure was tolerated well with a pain level of 0 throughout and a pain level of 0 following the procedure. Post Debridement Measurements: 3.9cm length x 4cm width x 3cm depth; 36.757cm^3 volume. Post procedure Diagnosis Wound #1: fu thickness with exposed support structures Plan Wound Cleansing: Wound #1 Left,Proximal,Lateral Lower Leg: Clean wound with Normal Saline. Wound #2 Left,Distal,Lateral Lower Leg: Clean wound with Normal Saline. Anesthetic: Wound #1 Left,Proximal,Lateral Lower Leg: Topical Lidocaine 4% cream applied to wound bed prior to debridement Wound #2 Left,Distal,Lateral Lower Leg: Topical Lidocaine 4% cream applied to wound bed prior to debridement Skin Barriers/Peri-Wound Care: Wound #1 Left,Proximal,Lateral Lower Leg: Skin Prep Wound #2 Left,Distal,Lateral Lower  Leg: Skin Prep Primary Wound Dressing: Wound #1 Left,Proximal,Lateral Lower Leg: Prisma Ag - or equivalent and lightly pack over silver collagen dressing with saline moistened gauze Wound #2 Left,Distal,Lateral Lower Leg: Prisma Ag - or equivalent Secondary Dressing: Wound #1 Left,Proximal,Lateral Lower Leg: ABD and Kerlix/Conform Wound #2 Left,Distal,Lateral Lower Leg: ABD and Kerlix/Conform Dressing Change Frequency: Wound #1 Left,Proximal,Lateral Lower Leg: Change dressing every other day. Wound #2 Left,Distal,Lateral Lower Leg: Change dressing every other day. Follow-up Appointments: Wound #1 Left,Proximal,Lateral Lower Leg: Return Appointment in 1 week. Wound #2 Left,Distal,Lateral Lower Leg: Return Appointment in 1 week. Home Health: Wound #1 Left,Proximal,Lateral Lower Leg: ALEXANDERA, KUNTZMAN ([id number]) Initiate Home Health for Skilled Nursing Home Health Nurse may visit PRN to address patient s wound care needs. FACE TO FACE ENCOUNTER: MEDICARE and MEDICAID PATIENTS: I certify that this patient is under my care and that I had a face-to-face encounter that meets the physician face-to-face encounter requirements with this patient on this date. The encounter with the patient was in whole or in part for the following MEDICAL CONDITION: (primary reason for Home Healthcare) MEDICAL NECESSITY: I certify, that based on my findings, NURSING services are a medically necessary home health service. HOME BOUND STATUS: I certify that my clinical findings support that this patient is homebound (i.e., Due to illness or injury, pt requires aid of supportive devices such as crutches, cane, wheelchairs, walkers, the use of special transportation or the assistance of another person to leave their place of residence.  There is a normal inability to leave the home and doing so requires considerable and taxing effort. Other absences are for medical reasons / religious services and are  infrequent or of short duration when for other reasons). If current dressing causes regression in wound condition, may D/C ordered dressing product/s and apply Normal Saline Moist Dressing daily until next Wound Healing Center / Other MD appointment. Notify Wound Healing Center of regression in wound condition at [phone]. Please direct any NON-WOUND related issues/requests for orders to patient's Primary Care Physician Wound #2 Left,Distal,Lateral Lower Leg: Initiate Home Health for Skilled Nursing Home Health Nurse may visit PRN to address patient s wound care needs. FACE TO FACE ENCOUNTER: MEDICARE and MEDICAID PATIENTS: I certify that this patient is under my care and that I had a face-to-face encounter that meets the physician face-to-face encounter requirements with this patient on this date. The encounter with the patient was in whole or in part for the following MEDICAL CONDITION: (primary reason for Home Healthcare) MEDICAL NECESSITY: I certify, that based on my findings, NURSING services are a medically necessary home health service. HOME BOUND STATUS: I certify that my clinical findings support that this patient is homebound (i.e., Due to illness or injury, pt requires aid of supportive devices such as crutches, cane, wheelchairs, walkers, the use of special transportation or the assistance of another person to leave their place of residence. There is a normal inability to leave the home and doing so requires considerable and taxing effort. Other absences are for medical reasons / religious services and are infrequent or of short duration when for other reasons). If current dressing causes regression in wound condition, may D/C ordered dressing product/s and apply Normal Saline Moist Dressing daily until next Wound Healing Center / Other MD appointment. Notify Wound Healing Center of regression in wound condition at [phone]. Please direct any NON-WOUND related issues/requests  for orders to patient's Primary Care Physician #1 evacuated hematoma as described which historically seems to what happened some time since last weekend #2 extensive surgical debridement #3 pack the wound with silver college and moist gauze, ABDs Kerlix and Coban. We have contacted home health to change the dressing #4 spoke to Dr. Martha Clan on the phone to update him about the hardware, he stated there wasn't a lot of options. #5 I did a culture of this area, I agree with the antibiotics that Dr. Martha Clan suggested including Augmentin and Septra #6 I will contact the patient to reduce the Lovenox to once a day tomorrow #7 ultimately the patient will need a wound VAC however I don't think this is ready yet, I would like to make Guardado, Kaylany A. ([id number]) sure that the infection angle of this is properly managed #8 I suspect she has ligamentous injury in the knee as well. There may be a mild effusion Electronic Signature(s) Signed: 07/25/2015 6:33:23 PM By: Baltazar Najjar MD Entered By: Baltazar Najjar on 07/25/2015 17:58:36 Silsby, [name] ([id number]) -------------------------------------------------------------------------------- ROS/PFSH Details Patient Name: Brittany Low A. Date of Service: 07/25/2015 2:00 PM Medical Record Patient Account Number: [id number] Number: Treating RN: Curtis Sites 22-Apr-1966 (50 y.o. Other Clinician: Date of Birth/Sex: Female) Treating ROBSON, MICHAEL Primary Care Physician/Extender: Jodi Marble, Harriett Physician: Referring Physician: Joeseph Amor in Treatment: 0 Information Obtained From Patient Wound History Do you currently have one or more open woundso Yes How many open wounds do you currently haveo 1 Approximately how long have you had your woundso 3 weeks How have you been  treating your wound(s) until nowo dry dressing Has your wound(s) ever healed and then re-openedo No Have you had any lab work done in  the past montho No Have you tested positive for an antibiotic resistant organism (MRSA, VRE)o No Have you tested positive for osteomyelitis (bone infection)o No Have you had any tests for circulation on your legso No Constitutional Symptoms (General Health) Complaints and Symptoms: No Complaints or Symptoms Eyes Complaints and Symptoms: No Complaints or Symptoms Ear/Nose/Mouth/Throat Complaints and Symptoms: No Complaints or Symptoms Hematologic/Lymphatic Complaints and Symptoms: No Complaints or Symptoms Respiratory Complaints and Symptoms: No Complaints or Symptoms Medical History: DERRICA, SIEG A. ([id number]) Positive for: Asthma; Chronic Obstructive Pulmonary Disease (COPD) Cardiovascular Complaints and Symptoms: No Complaints or Symptoms Medical History: Positive for: Hypertension Gastrointestinal Complaints and Symptoms: No Complaints or Symptoms Endocrine Medical History: Positive for: Type II Diabetes Time with diabetes: 18 years Treated with: Insulin Blood sugar tested every day: No Genitourinary Complaints and Symptoms: No Complaints or Symptoms Immunological Complaints and Symptoms: No Complaints or Symptoms Integumentary (Skin) Complaints and Symptoms: No Complaints or Symptoms Musculoskeletal Complaints and Symptoms: No Complaints or Symptoms Neurologic Complaints and Symptoms: No Complaints or Symptoms Oncologic SADE, HOLLON A. ([id number]) Complaints and Symptoms: No Complaints or Symptoms Psychiatric Complaints and Symptoms: No Complaints or Symptoms Hospitalization / Surgery History Name of Hospital Purpose of Hospitalization/Surgery Date leg fracture 07/03/2015 Family and Social History Cancer: No; Diabetes: Yes - Father, Mother, Siblings; Heart Disease: Yes - Father; Hypertension: Yes - Father; Kidney Disease: No; Lung Disease: No; Seizures: No; Stroke: No; Thyroid Problems: No; Tuberculosis: No; Current every day smoker; Marital  Status - Married; Alcohol Use: Never; Drug Use: No History; Caffeine Use: Never; Financial Concerns: No; Food, Clothing or Shelter Needs: No; Support System Lacking: No; Transportation Concerns: No; Advanced Directives: No; Patient does not want information on Advanced Directives Electronic Signature(s) Signed: 07/25/2015 6:11:08 PM By: Curtis Sites Signed: 07/25/2015 6:33:23 PM By: Baltazar Najjar MD Entered By: Curtis Sites on 07/25/2015 14:30:13 Wyrick, [name] ([id number]) -------------------------------------------------------------------------------- SuperBill Details Patient Name: Brittany Low A. Date of Service: 07/25/2015 Medical Record Patient Account Number: [id number] Number: Treating RN: Curtis Sites 1966/01/21 (50 y.o. Other Clinician: Date of Birth/Sex: Female) Treating ROBSON, MICHAEL Primary Care Physician/Extender: Jodi Marble, Harriett Physician: Tania Ade in Treatment: 0 Referring Physician: Juanell Fairly Diagnosis Coding ICD-10 Codes Code Description Disruption of external operation (surgical) wound, not elsewhere classified, initial T81.31XA encounter S82.102A Unspecified fracture of upper end of left tibia, initial encounter for closed fracture E11.622 Type 2 diabetes mellitus with other skin ulcer Facility Procedures CPT4: Description Modifier Quantity Code [id number] 99213 - WOUND CARE VISIT-LEV 3 EST PT 1 CPT4: 17001749 11042 - DEB SUBQ TISSUE 20 SQ CM/< 1 ICD-10 Description Diagnosis T81.31XA Disruption of external operation (surgical) wound, not elsewhere classified, initial encounter Physician Procedures CPT4: Description Modifier Quantity Code [id number] 99204 - WC PHYS LEVEL 4 - NEW PT 1 ICD-10 Description Diagnosis T81.31XA Disruption of external operation (surgical) wound, not elsewhere classified, initial encounter CPT4: 1638466 11042 - WC PHYS SUBQ TISS 20 SQ CM 1 ICD-10 Description Diagnosis T81.31XA Disruption of external operation  (surgical) wound, not elsewhere classified, initial encounter PHILLIPPA, STRAUB ([id number]) Electronic Signature(s) Signed: 07/25/2015 6:33:23 PM By: Baltazar Najjar MD Entered By: Baltazar Najjar on 07/25/2015 17:59:15

## 2015-07-26 NOTE — Progress Notes (Addendum)
[name] ([id number]) Visit Report for 07/25/2015 Allergy List Details Patient Name: [name], [name] Date of Service: 07/25/2015 2:00 PM Medical Record Patient Account Number: [id number] Number: Treating RN: [name] [birth year] (50 y.o. Other Clinician: Date of Birth/Sex: Female) Treating [name] Primary Care Physician: [name] Physician/Extender: G Referring Physician: Fulton [name] Weeks in Treatment: 0 Allergies Active Allergies No Known Allergies Allergy Notes Electronic Signature(s) Signed: 07/25/2015 6:11:08 PM By: [name] Entered By: [name] on 07/25/2015 14:26:43 [name] ([id number]) -------------------------------------------------------------------------------- Arrival Information Details Patient Name: Brittany Low A. Date of Service: 07/25/2015 2:00 PM Medical Record Patient Account Number: [id number] Number: Treating RN: [name] May 18, 1965 (50 y.o. Other Clinician: Date of Birth/Sex: Female) Treating [name] Primary Care Physician: [name] Physician/Extender: G Referring Physician: Glade Nurse in Treatment: 0 Visit Information Patient Arrived: Wheel Chair Arrival Time: 14:17 Accompanied By: brother Transfer Assistance: Manual Patient Identification Verified: Yes Secondary Verification Process Yes Completed: Patient Has Alerts: Yes Patient Alerts: DMII Hep C NO ABI R/T PAIN Electronic Signature(s) Signed: 11/08/2015 12:42:38 PM By: [name] Previous Signature: 07/25/2015 6:11:08 PM Version By: [name] Entered By: [name] on 11/08/2015 12:42:38 [name] ([id number]) -------------------------------------------------------------------------------- Clinic Level of Care Assessment Details Patient Name: Brittany Low A. Date of Service: 07/25/2015 2:00 PM Medical Record Patient Account Number:  [id number] Number: Treating RN: [name] [birth year] (50 y.o. Other Clinician: Date of Birth/Sex: Female) Treating [name] Primary Care Physician: [name] Physician/Extender: G Referring Physician: Fulton [name] Weeks in Treatment: 0 Clinic Level of Care Assessment Items TOOL 1 Quantity Score  - Use when [name] and Procedure is performed on INITIAL visit 0 ASSESSMENTS - Nursing Assessment / Reassessment X - General Physical Exam (combine w/ comprehensive assessment (listed just 1 20 below) when performed on new pt. evals) X - Comprehensive Assessment (HX, ROS, Risk Assessments, Wounds Hx, etc.) 1 25 ASSESSMENTS - Wound and Skin Assessment / Reassessment  - Dermatologic / Skin Assessment (not related to wound area) 0 ASSESSMENTS - Ostomy and/or Continence Assessment and Care  - Incontinence Assessment and Management 0  - Ostomy Care Assessment and Management (repouching, etc.) 0 PROCESS - Coordination of Care X - Simple Patient / Family Education for ongoing care 1 15  - Complex (extensive) Patient / Family Education for ongoing care 0 X - Staff obtains Chiropractor, Records, Test Results / Process Orders 1 10  - Staff telephones HHA, Nursing Homes / Clarify orders / etc 0  - Routine Transfer to another Facility (non-emergent condition) 0  - Routine Hospital Admission (non-emergent condition) 0 X - New Admissions / Manufacturing engineer / Ordering NPWT, Apligraf, etc. 1 15  - Emergency Hospital Admission (emergent condition) 0 PROCESS - Special Needs  - Pediatric / Minor Patient Management 0 [name], [name] ([id number])  - Isolation Patient Management 0  - Hearing / Language / Visual special needs 0  - Assessment of Community assistance (transportation, D/C planning, etc.) 0  - Additional assistance / Altered mentation 0  - Support Surface(s) Assessment (bed, cushion, seat, etc.) 0 INTERVENTIONS - Miscellaneous   - External ear exam 0  - Patient Transfer (multiple staff / Nurse, adult / Similar devices) 0  - Simple Staple / Suture removal (25 or less) 0  - Complex Staple / Suture removal (26 or more) 0  - Hypo/Hyperglycemic Management (do not check if billed separately) 0  - Ankle / Brachial Index (ABI) - do not check if billed separately  0 Has the patient been seen at the hospital within the last three years: Yes Total Score: 85 Level Of Care: New/Established - Level 3 Electronic Signature(s) Signed: 07/25/2015 6:11:08 PM By: Brittany Sitesorthy, [name] Entered By: Brittany Sitesorthy, [name] on 07/25/2015 15:35:09 Kouba, Brittany HalfERESA A. ([id number]) -------------------------------------------------------------------------------- Encounter Discharge Information Details Patient Name: Brittany LowSCOGGINS, [name] Date of Service: 07/25/2015 2:00 PM Medical Record Patient Account Number: [id number] Number: Treating RN: Brittany SitesDorthy, [name] [birth year] (50 y.o. Other Clinician: Date of Birth/Sex: Female) Treating [name] Primary Care Physician: Brittany MoleBurns, Harriett Physician/Extender: G Referring Physician: Fulton MoleBurns, Harriett Weeks in Treatment: 0 Encounter Discharge Information Items Discharge Pain Level: 0 Discharge Condition: Stable Ambulatory Status: Wheelchair Discharge Destination: Home Transportation: Private Auto Accompanied By: brother Schedule Follow-up Appointment: Yes Medication Reconciliation completed and provided to Patient/Care No [name]: Provided on Clinical Summary of Care: 07/25/2015 Form Type Recipient Paper Patient TS Electronic Signature(s) Signed: 07/25/2015 3:59:41 PM By: Brittany PerkingMoore, [name] Entered By: Brittany PerkingMoore, [name] on 07/25/2015 15:59:41 [name] Kitchen. ([id number]) -------------------------------------------------------------------------------- Lower Extremity Assessment Details Patient Name: Brittany LowSCOGGINS, Samika A. Date of Service: 07/25/2015 2:00 PM Medical Record Patient Account Number:  [id number] Number: Treating RN: Brittany SitesDorthy, [name] [birth year] (50 y.o. Other Clinician: Date of Birth/Sex: Female) Treating [name] Primary Care Physician: Brittany MoleBurns, Harriett Physician/Extender: G Referring Physician: Fulton MoleBurns, Harriett Weeks in Treatment: 0 Edema Assessment Assessed: [Left: No] [Right: No] Edema: [Left: Yes] [Right: No] Calf Left: Right: Point of Measurement: 38 cm From Medial Instep 41.7 cm cm Ankle Left: Right: Point of Measurement: 10 cm From Medial Instep 21.5 cm cm Vascular Assessment Pulses: Posterior Tibial Palpable: [Left:Yes] [Right:Yes] Doppler: [Left:Multiphasic] [Right:Multiphasic] Dorsalis Pedis Palpable: [Left:Yes] [Right:Yes] Doppler: [Left:Multiphasic] [Right:Multiphasic] Extremity colors, hair growth, and conditions: Extremity Color: [Left:Hyperpigmented] [Right:Normal] Hair Growth on Extremity: [Left:No] [Right:No] Temperature of Extremity: [Left:Warm] [Right:Warm] Capillary Refill: [Left:< 3 seconds] [Right:< 3 seconds] Toe Nail Assessment Left: Right: Thick: Yes Yes Discolored: No No Deformed: No No Improper Length and Hygiene: No No Notes Hurd, [name] ([id number]) no ABI r/t wound location, etiology and pain in leg Electronic Signature(s) Signed: 11/08/2015 12:42:15 PM By: Brittany Sitesorthy, [name] Previous Signature: 07/25/2015 6:11:08 PM Version By: Brittany Sitesorthy, [name] Entered By: Brittany Sitesorthy, [name] on 11/08/2015 12:42:15 Corado, [name] Kitchen. ([id number]) -------------------------------------------------------------------------------- Multi Wound Chart Details Patient Name: Brittany LowSCOGGINS, [name] Date of Service: 07/25/2015 2:00 PM Medical Record Patient Account Number: [id number] Number: Treating RN: Brittany SitesDorthy, [name] [birth year] (50 y.o. Other Clinician: Date of Birth/Sex: Female) Treating [name] Primary Care Physician: Brittany MoleBurns, Harriett Physician/Extender: G Referring Physician: Fulton MoleBurns, Harriett Weeks in Treatment:  0 Vital Signs Height(in): 62 Pulse(bpm): 88 Weight(lbs): 235 Blood Pressure 109/70 (mmHg): Body Mass Index(BMI): 43 Temperature(F): 98.3 Respiratory Rate 20 (breaths/min): Photos: [1:No Photos] [2:No Photos] [N/A:N/A] Wound Location: [1:Left Lower Leg - Lateral, Proximal] [2:Left, Distal, Lateral Lower Leg] [N/A:N/A] Wounding Event: [1:Surgical Injury] [2:Surgical Injury] [N/A:N/A] Primary Etiology: [1:Open Surgical Wound] [2:Open Surgical Wound] [N/A:N/A] Comorbid History: [1:Asthma, Chronic Obstructive Pulmonary Disease (COPD), Hypertension, Type II Diabetes] [2:Asthma, Chronic Obstructive Pulmonary Disease (COPD), Hypertension, Type II Diabetes] [N/A:N/A] Date Acquired: [1:07/03/2015] [2:07/03/2015] [N/A:N/A] Weeks of Treatment: [1:0] [2:0] [N/A:N/A] Wound Status: [1:Open] [2:Open] [N/A:N/A] Measurements L x W x D 3.9x4x1.6 [2:3.9x0.4x0.3] [N/A:N/A] (cm) Area (cm) : [1:12.252] [2:1.225] [N/A:N/A] Volume (cm) : [1:19.604] [2:0.368] [N/A:N/A] % Reduction in Area: [1:N/A] [2:0.00%] [N/A:N/A] % Reduction in Volume: N/A [2:-199.20%] [N/A:N/A] Classification: [1:Full Thickness Without Exposed Support Structures] [2:Partial Thickness] [N/A:N/A] HBO Classification: [1:Grade 1] [2:Grade 1] [N/A:N/A] Exudate Amount: [1:Large] [2:Small] [N/A:N/A] Exudate Type: [1:Sanguinous] [2:Sanguinous] [N/A:N/A] Exudate Color: [1:red] [2:red] [N/A:N/A] Wound Margin: [  1:Flat and Intact] [2:Flat and Intact] [N/A:N/A] Granulation Amount: [1:None Present (0%)] [2:None Present (0%)] [N/A:N/A] Necrotic Amount: Large (67-100%) Large (67-100%) N/A Necrotic Tissue: Eschar Eschar, Adherent Slough N/A Exposed Structures: Fascia: No Fascia: No N/A Fat: No Fat: No Tendon: No Tendon: No Muscle: No Muscle: No Joint: No Joint: No Bone: No Bone: No Limited to Skin Limited to Skin Breakdown Breakdown Epithelialization: None None N/A Periwound Skin Texture: Edema: No Edema: No N/A Excoriation:  No Excoriation: No Induration: No Induration: No Callus: No Callus: No Crepitus: No Crepitus: No Fluctuance: No Fluctuance: No Friable: No Friable: No Rash: No Rash: No Scarring: No Scarring: No Periwound Skin Moist: Yes Moist: Yes N/A Moisture: Maceration: No Maceration: No Dry/Scaly: No Dry/Scaly: No Periwound Skin Color: Erythema: Yes Atrophie Blanche: No N/A Atrophie Blanche: No Cyanosis: No Cyanosis: No Ecchymosis: No Ecchymosis: No Erythema: No Hemosiderin Staining: No Hemosiderin Staining: No Mottled: No Mottled: No Pallor: No Pallor: No Rubor: No Rubor: No Erythema Location: Circumferential N/A N/A Tenderness on Yes Yes N/A Palpation: Wound Preparation: Ulcer Cleansing: Ulcer Cleansing: N/A Rinsed/Irrigated with Rinsed/Irrigated with Saline Saline Topical Anesthetic Topical Anesthetic Applied: Other: lidocaine Applied: Other: lidocaine 4% 4% Treatment Notes Electronic Signature(s) Signed: 07/25/2015 6:11:08 PM By: [name] Entered By: [name] on 07/25/2015 15:09:02 Lucianne Muss ([id number]) -------------------------------------------------------------------------------- Multi-Disciplinary Care Plan Details Patient Name: Brittany Low A. Date of Service: 07/25/2015 2:00 PM Medical Record Patient Account Number: [id number] Number: Treating RN: [name] 31-Dec-1965 (50 y.o. Other Clinician: Date of Birth/Sex: Female) Treating [name] Primary Care Physician: [name] Physician/Extender: G Referring Physician: Fulton [name] Weeks in Treatment: 0 Active Inactive Abuse / Safety / Falls / Self Care Management Nursing Diagnoses: Potential for falls Goals: Patient will remain injury free Date Initiated: 07/25/2015 Goal Status: Active Interventions: Assess fall risk on admission and as needed Notes: Orientation to the Wound Care Program Nursing Diagnoses: Knowledge deficit related to the  wound healing center program Goals: Patient/caregiver will verbalize understanding of the Wound Healing Center Program Date Initiated: 07/25/2015 Goal Status: Active Interventions: Provide education on orientation to the wound center Notes: Wound/Skin Impairment Nursing Diagnoses: Impaired tissue integrity Knowledge deficit related to smoking impact on wound healing Closser, [name] ([id number]) Goals: Patient/caregiver will verbalize understanding of skin care regimen Date Initiated: 07/25/2015 Goal Status: Active Ulcer/skin breakdown will have a volume reduction of 30% by week 4 Date Initiated: 07/25/2015 Goal Status: Active Ulcer/skin breakdown will have a volume reduction of 50% by week 8 Date Initiated: 07/25/2015 Goal Status: Active Ulcer/skin breakdown will have a volume reduction of 80% by week 12 Date Initiated: 07/25/2015 Goal Status: Active Ulcer/skin breakdown will heal within 14 weeks Date Initiated: 07/25/2015 Goal Status: Active Interventions: Assess patient/caregiver ability to obtain necessary supplies Assess patient/caregiver ability to perform ulcer/skin care regimen upon admission and as needed Assess ulceration(s) every visit Provide education on smoking Notes: Electronic Signature(s) Signed: 07/25/2015 6:11:08 PM By: [name] Entered By: [name] on 07/25/2015 15:08:30 Timme, [name] Kitchen ([id number]) -------------------------------------------------------------------------------- Pain Assessment Details Patient Name: Brittany Low A. Date of Service: 07/25/2015 2:00 PM Medical Record Patient Account Number: [id number] Number: Treating RN: [name] [birth year] (50 y.o. Other Clinician: Date of Birth/Sex: Female) Treating [name] Primary Care Physician: [name] Physician/Extender: G Referring Physician: Fulton [name] Weeks in Treatment: 0 Active Problems Location of Pain Severity and Description of  Pain Patient Has Paino Yes Site Locations Pain Location: Pain in Ulcers With Dressing Change: Yes Duration of the Pain. Constant /  Intermittento Constant Pain Management and Medication Current Pain Management: Electronic Signature(s) Signed: 07/25/2015 6:11:08 PM By: [name] Entered By: [name] on 07/25/2015 14:22:11 Schwegler, [name] ([id number]) -------------------------------------------------------------------------------- Patient/Caregiver Education Details Patient Name: Brittany Low A. Date of Service: 07/25/2015 2:00 PM Medical Record Patient Account Number: [id number] Number: Treating RN: [name] [birth year] (50 y.o. Other Clinician: Date of Birth/Gender: Female) Treating [name] Primary Care Physician: [name] Physician/Extender: G Referring Physician: Glade Nurse in Treatment: 0 Education Assessment Education Provided To: Patient and Caregiver Education Topics Provided Wound/Skin Impairment: Handouts: Other: wound care as ordered Methods: Demonstration, Explain/Verbal Responses: State content correctly Electronic Signature(s) Signed: 07/25/2015 6:11:08 PM By: [name] Entered By: [name] on 07/25/2015 15:37:35 Casaus, [name] Kitchen ([id number]) -------------------------------------------------------------------------------- Wound Assessment Details Patient Name: Brittany Low A. Date of Service: 07/25/2015 2:00 PM Medical Record Patient Account Number: [id number] Number: Treating RN: [name] [birth year] (50 y.o. Other Clinician: Date of Birth/Sex: Female) Treating [name] Primary Care Physician: [name] Physician/Extender: G Referring Physician: Fulton [name] Weeks in Treatment: 0 Wound Status Wound Number: 1 Primary Open Surgical Wound Etiology: Wound Location: Left Lower Leg - Lateral, Proximal Wound Open Status: Wounding Event: Surgical  Injury Comorbid Asthma, Chronic Obstructive Date Acquired: 07/03/2015 History: Pulmonary Disease (COPD), Weeks Of Treatment: 0 Hypertension, Type II Diabetes Clustered Wound: No Photos Photo Uploaded By: [name] on 07/25/2015 17:35:46 Wound Measurements Length: (cm) 3.9 Width: (cm) 4 Depth: (cm) 1.6 Area: (cm) 12.252 Volume: (cm) 19.604 % Reduction in Area: % Reduction in Volume: Epithelialization: None Tunneling: No Undermining: No Wound Description Full Thickness Without Exposed Foul Odor Aft Classification: Support Structures Diabetic Severity Grade 1 (Wagner): Wound Margin: Flat and Intact Exudate Amount: Large Exudate Type: Sanguinous Exudate Color: red Henckel, [name] ([id number]) er Cleansing: No Wound Bed Granulation Amount: None Present (0%) Exposed Structure Necrotic Amount: Large (67-100%) Fascia Exposed: No Necrotic Quality: Eschar Fat Layer Exposed: No Tendon Exposed: No Muscle Exposed: No Joint Exposed: No Bone Exposed: No Limited to Skin Breakdown Periwound Skin Texture Texture Color No Abnormalities Noted: No No Abnormalities Noted: No Callus: No Atrophie Blanche: No Crepitus: No Cyanosis: No Excoriation: No Ecchymosis: No Fluctuance: No Erythema: Yes Friable: No Erythema Location: Circumferential Induration: No Hemosiderin Staining: No Localized Edema: No Mottled: No Rash: No Pallor: No Scarring: No Rubor: No Moisture Temperature / Pain No Abnormalities Noted: No Tenderness on Palpation: Yes Dry / Scaly: No Maceration: No Moist: Yes Wound Preparation Ulcer Cleansing: Rinsed/Irrigated with Saline Topical Anesthetic Applied: Other: lidocaine 4%, Electronic Signature(s) Signed: 07/25/2015 6:11:08 PM By: [name] Entered By: [name] on 07/25/2015 14:44:54 Lampron, [name] Kitchen ([id number]) -------------------------------------------------------------------------------- Wound Assessment Details Patient  Name: Brittany Low A. Date of Service: 07/25/2015 2:00 PM Medical Record Patient Account Number: [id number] Number: Treating RN: [name] Aug 04, 1965 (50 y.o. Other Clinician: Date of Birth/Sex: Female) Treating [name] Primary Care Physician: [name] Physician/Extender: G Referring Physician: Fulton [name] Weeks in Treatment: 0 Wound Status Wound Number: 2 Primary Open Surgical Wound Etiology: Wound Location: Left, Distal, Lateral Lower Leg Wound Open Wounding Event: Surgical Injury Status: Date Acquired: 07/03/2015 Comorbid Asthma, Chronic Obstructive Weeks Of Treatment: 0 History: Pulmonary Disease (COPD), Clustered Wound: No Hypertension, Type II Diabetes Photos Photo Uploaded By: [name] on 07/25/2015 17:36:17 Wound Measurements Length: (cm) 3.9 Width: (cm) 0.4 Depth: (cm) 0.3 Area: (cm) 1.225 Volume: (cm) 0.368 % Reduction in Area: 0% % Reduction in Volume: -199.2% Epithelialization: None Tunneling: No Undermining: No Wound Description Classification:  Partial Thickness Diabetic Severity [address]): Grade 1 Wound Margin: Flat and Intact Exudate Amount: Small Exudate Type: Sanguinous Exudate Color: red Foul Odor After Cleansing: No Wound Bed Granulation Amount: None Present (0%) Exposed Structure Kise, [name] ([id number]) Necrotic Amount: Large (67-100%) Fascia Exposed: No Necrotic Quality: Eschar, Adherent Slough Fat Layer Exposed: No Tendon Exposed: No Muscle Exposed: No Joint Exposed: No Bone Exposed: No Limited to Skin Breakdown Periwound Skin Texture Texture Color No Abnormalities Noted: No No Abnormalities Noted: No Callus: No Atrophie Blanche: No Crepitus: No Cyanosis: No Excoriation: No Ecchymosis: No Fluctuance: No Erythema: No Friable: No Hemosiderin Staining: No Induration: No Mottled: No Localized Edema: No Pallor: No Rash: No Rubor: No Scarring: No Temperature /  Pain Moisture Tenderness on Palpation: Yes No Abnormalities Noted: No Dry / Scaly: No Maceration: No Moist: Yes Wound Preparation Ulcer Cleansing: Rinsed/Irrigated with Saline Topical Anesthetic Applied: Other: lidocaine 4%, Electronic Signature(s) Signed: 07/25/2015 6:11:08 PM By: [name] Entered By: [name] on 07/25/2015 14:53:37 Current, [name] ([id number]) -------------------------------------------------------------------------------- Vitals Details Patient Name: Brittany Low A. Date of Service: 07/25/2015 2:00 PM Medical Record Patient Account Number: [id number] Number: Treating RN: [name] [birth year] (50 y.o. Other Clinician: Date of Birth/Sex: Female) Treating [name] Primary Care Physician: [name] Physician/Extender: G Referring Physician: Fulton [name] Weeks in Treatment: 0 Vital Signs Time Taken: 14:22 Temperature (F): 98.3 Height (in): 62 Pulse (bpm): 88 Source: Stated Respiratory Rate (breaths/min): 20 Weight (lbs): 235 Blood Pressure (mmHg): 109/70 Source: Stated Reference Range: 80 - 120 mg / dl Body Mass Index (BMI): 43 Electronic Signature(s) Signed: 07/25/2015 6:11:08 PM By: [name] Entered By: [name] on 07/25/2015 14:25:52

## 2015-07-26 NOTE — Progress Notes (Signed)
Lucianne MussSCOGGINS, [name] ([id number]) Visit Report for 07/25/2015 Abuse/Suicide Risk Screen Details Patient Name: [name], [name] Date of Service: 07/25/2015 2:00 PM Medical Record Patient Account Number: [id number] Number: Treating RN: Curtis SitesDorthy, Joanna 04/28/1966 (50 y.o. Other Clinician: Date of Birth/Sex: Female) Treating ROBSON, MICHAEL Primary Care Physician/Extender: Jodi MarbleG Burns, Harriett Physician: Referring Physician: Joeseph AmorKRASINSKI, KEVIN Weeks in Treatment: 0 Abuse/Suicide Risk Screen Items Answer ABUSE/SUICIDE RISK SCREEN: Has anyone close to you tried to hurt or harm you recentlyo No Do you feel uncomfortable with anyone in your familyo No Has anyone forced you do things that you didnot want to doo No Do you have any thoughts of harming yourselfo No Patient displays signs or symptoms of abuse and/or neglect. No Electronic Signature(s) Signed: 07/25/2015 6:11:08 PM By: Curtis Sitesorthy, Joanna Entered By: Curtis Sitesorthy, Joanna on 07/25/2015 14:30:41 Kahrs, Orena AMarland Kitchen. ([id number]) -------------------------------------------------------------------------------- Activities of Daily Living Details Patient Name: [name], [name] Date of Service: 07/25/2015 2:00 PM Medical Record Patient Account Number: [id number] Number: Treating RN: Curtis SitesDorthy, Joanna 04/28/1966 (50 y.o. Other Clinician: Date of Birth/Sex: Female) Treating ROBSON, MICHAEL Primary Care Physician/Extender: Jodi MarbleG Burns, Harriett Physician: Referring Physician: Joeseph AmorKRASINSKI, KEVIN Weeks in Treatment: 0 Activities of Daily Living Items Answer Activities of Daily Living (Please select one for each item) Drive Automobile Not Able Take Medications Completely Able Use Telephone Completely Able Care for Appearance Completely Able Use Toilet Need Assistance Bath / Shower Need Assistance Dress Self Need Assistance Feed Self Completely Able Walk Not Able Get In / Out Bed Completely Able Housework Need Assistance Prepare  Meals Completely Able Handle Money Completely Able Shop for Self Need Assistance Electronic Signature(s) Signed: 07/25/2015 6:11:08 PM By: Curtis Sitesorthy, Joanna Entered By: Curtis Sitesorthy, Joanna on 07/25/2015 14:31:13 Thomley, Paul HalfERESA A. ([id number]) -------------------------------------------------------------------------------- Education Assessment Details Patient Name: [name], [name] Date of Service: 07/25/2015 2:00 PM Medical Record Patient Account Number: [id number] Number: Treating RN: Curtis SitesDorthy, Joanna 04/28/1966 (50 y.o. Other Clinician: Date of Birth/Sex: Female) Treating ROBSON, MICHAEL Primary Care Physician/Extender: Jodi MarbleG Burns, Harriett Physician: Referring Physician: Joeseph AmorKRASINSKI, KEVIN Weeks in Treatment: 0 Primary Learner Assessed: Patient Learning Preferences/Education Level/Primary Language Learning Preference: Explanation, Demonstration Highest Education Level: College or Above Preferred Language: English Cognitive Barrier Assessment/Beliefs Language Barrier: No Translator Needed: No Memory Deficit: No Emotional Barrier: No Cultural/Religious Beliefs Affecting Medical No Care: Physical Barrier Assessment Impaired Vision: No Impaired Hearing: No Decreased Hand dexterity: No Knowledge/Comprehension Assessment Knowledge Level: Medium Comprehension Level: Medium Ability to understand written Medium instructions: Ability to understand verbal Medium instructions: Motivation Assessment Anxiety Level: Calm Cooperation: Cooperative Education Importance: Acknowledges Need Interest in Health Problems: Asks Questions Perception: Coherent Willingness to Engage in Self- Medium Management Activities: Medium [name], [name] ([id number]) Readiness to Engage in Self- Management Activities: Electronic Signature(s) Signed: 07/25/2015 6:11:08 PM By: Curtis Sitesorthy, Joanna Entered By: Curtis Sitesorthy, Joanna on 07/25/2015 14:31:35 Krienke, Paul HalfERESA A.  ([id number]) -------------------------------------------------------------------------------- Fall Risk Assessment Details Patient Name: [name], [name] Date of Service: 07/25/2015 2:00 PM Medical Record Patient Account Number: [id number] Number: Treating RN: Curtis SitesDorthy, Joanna 04/28/1966 (50 y.o. Other Clinician: Date of Birth/Sex: Female) Treating ROBSON, MICHAEL Primary Care Physician/Extender: Jodi MarbleG Burns, Harriett Physician: Referring Physician: Joeseph AmorKRASINSKI, KEVIN Weeks in Treatment: 0 Fall Risk Assessment Items Have you had 2 or more falls in the last 12 monthso 0 No Have you had any fall that resulted in injury in the last 12 monthso 0 No FALL RISK ASSESSMENT: History of falling - immediate or within 3 months 0 No Secondary diagnosis 0 No Ambulatory aid None/bed rest/wheelchair/nurse 0 Yes  Crutches/cane/walker 0 No Furniture 0 No IV Access/Saline Lock 0 No Gait/Training Normal/bed rest/immobile 0 Yes Weak 0 No Impaired 0 No Mental Status Oriented to own ability 0 Yes Electronic Signature(s) Signed: 07/25/2015 6:11:08 PM By: Curtis Sites Entered By: Curtis Sites on 07/25/2015 14:31:49 Vandeventer, Adilene AMarland Kitchen ([id number]) -------------------------------------------------------------------------------- Foot Assessment Details Patient Name: Brittany Low A. Date of Service: 07/25/2015 2:00 PM Medical Record Patient Account Number: [id number] Number: Treating RN: Curtis Sites 1965-05-29 (50 y.o. Other Clinician: Date of Birth/Sex: Female) Treating ROBSON, MICHAEL Primary Care Physician/Extender: Jodi Marble, Harriett Physician: Referring Physician: Joeseph Amor in Treatment: 0 Foot Assessment Items Site Locations + = Sensation present, - = Sensation absent, C = Callus, U = Ulcer R = Redness, W = Warmth, M = Maceration, PU = Pre-ulcerative lesion F = Fissure, S = Swelling, D = Dryness Assessment Right: Left: Other Deformity: No No Prior  Foot Ulcer: No No Prior Amputation: No No Charcot Joint: No No Ambulatory Status: Ambulatory With Help Assistance Device: Wheelchair Gait: Steady Electronic Signature(s) Signed: 07/25/2015 6:11:08 PM By: Doretha Sou, [name] ([id number]) Entered By: Curtis Sites on 07/25/2015 14:32:16 Applin, Tenlee AMarland Kitchen ([id number]) -------------------------------------------------------------------------------- Nutrition Risk Assessment Details Patient Name: Brittany Low A. Date of Service: 07/25/2015 2:00 PM Medical Record Patient Account Number: [id number] Number: Treating RN: Curtis Sites 1966/01/07 (50 y.o. Other Clinician: Date of Birth/Sex: Female) Treating ROBSON, MICHAEL Primary Care Physician/Extender: Jodi Marble, Harriett Physician: Referring Physician: Joeseph Amor in Treatment: 0 Height (in): 62 Weight (lbs): 235 Body Mass Index (BMI): 43 Nutrition Risk Assessment Items NUTRITION RISK SCREEN: I have an illness or condition that made me change the kind and/or 0 No amount of food I eat I eat fewer than two meals per day 0 No I eat few fruits and vegetables, or milk products 0 No I have three or more drinks of beer, liquor or wine almost every day 0 No I have tooth or mouth problems that make it hard for me to eat 0 No I don't always have enough money to buy the food I need 0 No I eat alone most of the time 0 No I take three or more different prescribed or over-the-counter drugs a 1 Yes day Without wanting to, I have lost or gained 10 pounds in the last six 0 No months I am not always physically able to shop, cook and/or feed myself 0 No Nutrition Protocols Good Risk Protocol 0 No interventions needed Moderate Risk Protocol Electronic Signature(s) Signed: 07/25/2015 6:11:08 PM By: Curtis Sites Entered By: Curtis Sites on 07/25/2015 14:31:59

## 2015-08-01 LAB — WOUND CULTURE

## 2015-08-02 ENCOUNTER — Encounter: Payer: Medicaid Other | Attending: Internal Medicine | Admitting: Internal Medicine

## 2015-08-02 DIAGNOSIS — S82102A Unspecified fracture of upper end of left tibia, initial encounter for closed fracture: Secondary | ICD-10-CM | POA: Insufficient documentation

## 2015-08-02 DIAGNOSIS — T8131XA Disruption of external operation (surgical) wound, not elsewhere classified, initial encounter: Secondary | ICD-10-CM | POA: Diagnosis not present

## 2015-08-02 DIAGNOSIS — E11622 Type 2 diabetes mellitus with other skin ulcer: Secondary | ICD-10-CM | POA: Diagnosis present

## 2015-08-02 DIAGNOSIS — E669 Obesity, unspecified: Secondary | ICD-10-CM | POA: Diagnosis not present

## 2015-08-02 DIAGNOSIS — I10 Essential (primary) hypertension: Secondary | ICD-10-CM | POA: Diagnosis not present

## 2015-08-02 DIAGNOSIS — F17200 Nicotine dependence, unspecified, uncomplicated: Secondary | ICD-10-CM | POA: Diagnosis not present

## 2015-08-02 DIAGNOSIS — Y839 Surgical procedure, unspecified as the cause of abnormal reaction of the patient, or of later complication, without mention of misadventure at the time of the procedure: Secondary | ICD-10-CM | POA: Insufficient documentation

## 2015-08-02 DIAGNOSIS — Z794 Long term (current) use of insulin: Secondary | ICD-10-CM | POA: Diagnosis not present

## 2015-08-02 DIAGNOSIS — J449 Chronic obstructive pulmonary disease, unspecified: Secondary | ICD-10-CM | POA: Insufficient documentation

## 2015-08-03 NOTE — Progress Notes (Addendum)
PINKEY, MCJUNKIN ([id number]) Visit Report for 08/02/2015 Arrival Information Details Patient Name: [name], [name]. Date of Service: 08/02/2015 9:15 AM Medical Record Patient Account Number: [id number] Number: Treating RN: Curtis Sites Feb 28, 1966 (50 y.o. Other Clinician: Date of Birth/Sex: Female) Treating ROBSON, MICHAEL Primary Care Physician: Fulton Mole Physician/Extender: G Referring Physician: Glade Nurse in Treatment: 1 Visit Information History Since Last Visit Added or deleted any medications: No Patient Arrived: Wheel Chair Any new allergies or adverse reactions: No Arrival Time: 09:20 Had a fall or experienced change in No activities of daily living that may affect Accompanied By: brother risk of falls: Transfer Assistance: None Signs or symptoms of abuse/neglect since last No Patient Identification Verified: Yes visito Secondary Verification Process Yes Hospitalized since last visit: No Completed: Pain Present Now: Yes Patient Has Alerts: Yes Patient Alerts: DMII Hep C Electronic Signature(s) Signed: 08/02/2015 5:02:56 PM By: Curtis Sites Entered By: Curtis Sites on 08/02/2015 09:25:19 [name] ([id number]) -------------------------------------------------------------------------------- Clinic Level of Care Assessment Details Patient Name: [name] A. Date of Service: 08/02/2015 9:15 AM Medical Record Patient Account Number: [id number] Number: Treating RN: Curtis Sites 03-18-1966 (50 y.o. Other Clinician: Date of Birth/Sex: Female) Treating ROBSON, MICHAEL Primary Care Physician: Fulton Mole Physician/Extender: G Referring Physician: Glade Nurse in Treatment: 1 Clinic Level of Care Assessment Items TOOL 4 Quantity Score  - Use when only an EandM is performed on FOLLOW-UP visit 0 ASSESSMENTS - Nursing Assessment / Reassessment X - Reassessment of Co-morbidities (includes updates  in patient status) 1 10 X - Reassessment of Adherence to Treatment Plan 1 5 ASSESSMENTS - Wound and Skin Assessment / Reassessment X - Simple Wound Assessment / Reassessment - one wound 1 5  - Complex Wound Assessment / Reassessment - multiple wounds 0  - Dermatologic / Skin Assessment (not related to wound area) 0 ASSESSMENTS - Focused Assessment  - Circumferential Edema Measurements - multi extremities 0  - Nutritional Assessment / Counseling / Intervention 0 X - Lower Extremity Assessment (monofilament, tuning fork, pulses) 1 5  - Peripheral Arterial Disease Assessment (using hand held doppler) 0 ASSESSMENTS - Ostomy and/or Continence Assessment and Care  - Incontinence Assessment and Management 0  - Ostomy Care Assessment and Management (repouching, etc.) 0 PROCESS - Coordination of Care X - Simple Patient / Family Education for ongoing care 1 15  - Complex (extensive) Patient / Family Education for ongoing care 0  - Staff obtains Chiropractor, Records, Test Results / Process Orders 0  - Staff telephones HHA, Nursing Homes / Clarify orders / etc 0 [name], [name] ([id number])  - Routine Transfer to another Facility (non-emergent condition) 0  - Routine Hospital Admission (non-emergent condition) 0  - New Admissions / Manufacturing engineer / Ordering NPWT, Apligraf, etc. 0  - Emergency Hospital Admission (emergent condition) 0 X - Simple Discharge Coordination 1 10  - Complex (extensive) Discharge Coordination 0 PROCESS - Special Needs  - Pediatric / Minor Patient Management 0  - Isolation Patient Management 0  - Hearing / Language / Visual special needs 0  - Assessment of Community assistance (transportation, D/C planning, etc.) 0  - Additional assistance / Altered mentation 0  - Support Surface(s) Assessment (bed, cushion, seat, etc.) 0 INTERVENTIONS - Wound Cleansing / Measurement X - Simple Wound Cleansing - one wound 1 5  -  Complex Wound Cleansing - multiple wounds 0 X - Wound Imaging (photographs - any number of wounds) 1 5  - Wound Tracing (instead of  photographs) 0 X - Simple Wound Measurement - one wound 1 5 []  - Complex Wound Measurement - multiple wounds 0 INTERVENTIONS - Wound Dressings X - Small Wound Dressing one or multiple wounds 1 10 []  - Medium Wound Dressing one or multiple wounds 0 []  - Large Wound Dressing one or multiple wounds 0 []  - Application of Medications - topical 0 []  - Application of Medications - injection 0 [name], [name] ([id number]) INTERVENTIONS - Miscellaneous []  - External ear exam 0 []  - Specimen Collection (cultures, biopsies, blood, body fluids, etc.) 0 []  - Specimen(s) / Culture(s) sent or taken to Lab for analysis 0 []  - Patient Transfer (multiple staff / Michiel Sites Lift / Similar devices) 0 []  - Simple Staple / Suture removal (25 or less) 0 []  - Complex Staple / Suture removal (26 or more) 0 []  - Hypo / Hyperglycemic Management (close monitor of Blood Glucose) 0 []  - Ankle / Brachial Index (ABI) - do not check if billed separately 0 X - Vital Signs 1 5 Has the patient been seen at the hospital within the last three years: Yes Total Score: 80 Level Of Care: New/Established - Level 3 Electronic Signature(s) Signed: 08/02/2015 2:35:30 PM By: Curtis Sites Entered By: Curtis Sites on 08/02/2015 14:35:30 [name] ([id number]) -------------------------------------------------------------------------------- Encounter Discharge Information Details Patient Name: [name] A. Date of Service: 08/02/2015 9:15 AM Medical Record Patient Account Number: [id number] Number: Treating RN: Curtis Sites January 05, 1966 (50 y.o. Other Clinician: Date of Birth/Sex: Female) Treating ROBSON, MICHAEL Primary Care Physician: Fulton Mole Physician/Extender: G Referring Physician: Glade Nurse in Treatment: 1 Encounter Discharge Information  Items Discharge Pain Level: 0 Discharge Condition: Stable Ambulatory Status: Wheelchair Other (Note Discharge Destination: Required) Transportation: Private Auto Accompanied By: brother Schedule Follow-up Appointment: Yes Medication Reconciliation completed and provided to Patient/Care No Annaliz Aven: Provided on Clinical Summary of Care: 08/02/2015 Form Type Recipient Paper Patient TS Notes patient is on her way to Dr Burna Sis office Electronic Signature(s) Signed: 08/02/2015 2:37:55 PM By: Curtis Sites Previous Signature: 08/02/2015 10:53:36 AM Version By: Gwenlyn Perking Entered By: Curtis Sites on 08/02/2015 14:37:55 Laughter, Amsi AMarland Kitchen ([id number]) -------------------------------------------------------------------------------- Lower Extremity Assessment Details Patient Name: [name] A. Date of Service: 08/02/2015 9:15 AM Medical Record Patient Account Number: [id number] Number: Treating RN: Curtis Sites Aug 27, 1965 (50 y.o. Other Clinician: Date of Birth/Sex: Female) Treating ROBSON, MICHAEL Primary Care Physician: Fulton Mole Physician/Extender: G Referring Physician: Fulton Mole Weeks in Treatment: 1 Edema Assessment Assessed: [Left: No] [Right: No] Edema: [Left: Ye] [Right: s] Vascular Assessment Pulses: Posterior Tibial Dorsalis Pedis Palpable: [Left:Yes] Extremity colors, hair growth, and conditions: Extremity Color: [Left:Hyperpigmented] Hair Growth on Extremity: [Left:Yes] Temperature of Extremity: [Left:Warm] Capillary Refill: [Left:< 3 seconds] Electronic Signature(s) Signed: 08/02/2015 5:02:56 PM By: Curtis Sites Entered By: Curtis Sites on 08/02/2015 09:49:33 Mccue, Denelda AMarland Kitchen ([id number]) -------------------------------------------------------------------------------- Multi Wound Chart Details Patient Name: [name] A. Date of Service: 08/02/2015 9:15 AM Medical Record Patient Account Number:  [id number] Number: Treating RN: Curtis Sites [birth year] (50 y.o. Other Clinician: Date of Birth/Sex: Female) Treating ROBSON, MICHAEL Primary Care Physician: Fulton Mole Physician/Extender: G Referring Physician: Fulton Mole Weeks in Treatment: 1 Vital Signs Height(in): 62 Pulse(bpm): 80 Weight(lbs): 235 Blood Pressure 121/81 (mmHg): Body Mass Index(BMI): 43 Temperature(F): 98.1 Respiratory Rate 20 (breaths/min): Photos: [1:No Photos] [2:No Photos] [N/A:N/A] Wound Location: [1:Left Lower Leg - Lateral, Proximal] [2:Left Lower Leg - Lateral, Distal] [N/A:N/A] Wounding Event: [1:Surgical Injury] [2:Surgical Injury] [N/A:N/A] Primary Etiology: [1:Open Surgical Wound] [2:Open Surgical Wound] [  N/A:N/A] Comorbid History: [1:Asthma, Chronic Obstructive Pulmonary Disease (COPD), Hypertension, Type II Diabetes] [2:Asthma, Chronic Obstructive Pulmonary Disease (COPD), Hypertension, Type II Diabetes] [N/A:N/A] Date Acquired: [1:07/03/2015] [2:07/03/2015] [N/A:N/A] Weeks of Treatment: [1:1] [2:1] [N/A:N/A] Wound Status: [1:Open] [2:Open] [N/A:N/A] Measurements L x W x D 3.2x3.3x3 [2:3.4x0.5x0.3] [N/A:N/A] (cm) Area (cm) : [1:8.294] [2:1.335] [N/A:N/A] Volume (cm) : [1:24.881] [2:0.401] [N/A:N/A] % Reduction in Area: [1:32.30%] [2:-9.00%] [N/A:N/A] % Reduction in Volume: -26.90% [2:-9.00%] [N/A:N/A] Classification: [1:Full Thickness With Exposed Support Structures] [2:Partial Thickness] [N/A:N/A] HBO Classification: [1:Grade 1] [2:Grade 1] [N/A:N/A] Exudate Amount: [1:Large] [2:Small] [N/A:N/A] Exudate Type: [1:Sanguinous] [2:Sanguinous] [N/A:N/A] Exudate Color: [1:red] [2:red] [N/A:N/A] Wound Margin: [1:Flat and Intact] [2:Flat and Intact] [N/A:N/A] Granulation Amount: [1:None Present (0%)] [2:Small (1-33%)] [N/A:N/A] Granulation Quality: N/A Pink N/A Necrotic Amount: Large (67-100%) Large (67-100%) N/A Necrotic Tissue: Eschar Eschar, Adherent Slough N/A Exposed  Structures: Fat: Yes Fascia: No N/A Muscle: Yes Fat: No Joint: Yes Tendon: No Fascia: No Muscle: No Tendon: No Joint: No Bone: No Bone: No Limited to Skin Breakdown Epithelialization: None None N/A Periwound Skin Texture: Edema: No Edema: No N/A Excoriation: No Excoriation: No Induration: No Induration: No Callus: No Callus: No Crepitus: No Crepitus: No Fluctuance: No Fluctuance: No Friable: No Friable: No Rash: No Rash: No Scarring: No Scarring: No Periwound Skin Moist: Yes Moist: Yes N/A Moisture: Maceration: No Maceration: No Dry/Scaly: No Dry/Scaly: No Periwound Skin Color: Erythema: Yes Atrophie Blanche: No N/A Atrophie Blanche: No Cyanosis: No Cyanosis: No Ecchymosis: No Ecchymosis: No Erythema: No Hemosiderin Staining: No Hemosiderin Staining: No Mottled: No Mottled: No Pallor: No Pallor: No Rubor: No Rubor: No Erythema Location: Circumferential N/A N/A Tenderness on Yes Yes N/A Palpation: Wound Preparation: Ulcer Cleansing: Ulcer Cleansing: N/A Rinsed/Irrigated with Rinsed/Irrigated with Saline Saline Topical Anesthetic Topical Anesthetic Applied: Other: lidocaine Applied: Other: lidocaine 4% 4% Assessment Notes: hardware exposed N/A N/A Treatment Notes Electronic Signature(s) Signed: 08/02/2015 5:02:56 PM By: Curtis Sitesorthy, Joanna Entered By: Curtis Sitesorthy, Joanna on 08/02/2015 09:50:14 Dosch, Paul HalfERESA A. ([id number]) Lucianne MussSCOGGINS, Mackena A. ([id number]) -------------------------------------------------------------------------------- Multi-Disciplinary Care Plan Details Patient Name: [name], [name] Date of Service: 08/02/2015 9:15 AM Medical Record Patient Account Number: [id number] Number: Treating RN: Curtis SitesDorthy, Joanna 12-Aug-1965 (50 y.o. Other Clinician: Date of Birth/Sex: Female) Treating ROBSON, MICHAEL Primary Care Physician: Fulton MoleBurns, Harriett Physician/Extender: G Referring Physician: Fulton MoleBurns, Harriett Weeks in Treatment: 1 Active  Inactive Electronic Signature(s) Signed: 09/22/2015 10:11:32 AM By: Elliot GurneyWoody, RN, BSN, Kim RN, BSN Signed: 12/26/2015 4:06:55 PM By: Curtis Sitesorthy, Joanna Previous Signature: 08/02/2015 5:02:56 PM Version By: Curtis Sitesorthy, Joanna Entered By: Elliot GurneyWoody, RN, BSN, Kim on 09/06/2015 07:56:00 Fullbright, Paul HalfERESA A. ([id number]) -------------------------------------------------------------------------------- Pain Assessment Details Patient Name: [name], [name] Date of Service: 08/02/2015 9:15 AM Medical Record Patient Account Number: [id number] Number: Treating RN: Curtis SitesDorthy, Joanna 12-Aug-1965 (50 y.o. Other Clinician: Date of Birth/Sex: Female) Treating ROBSON, MICHAEL Primary Care Physician: Fulton MoleBurns, Harriett Physician/Extender: G Referring Physician: Fulton MoleBurns, Harriett Weeks in Treatment: 1 Active Problems Location of Pain Severity and Description of Pain Patient Has Paino Yes Site Locations Pain Location: Pain in Ulcers With Dressing Change: Yes Duration of the Pain. Constant / Intermittento Constant Pain Management and Medication Current Pain Management: Electronic Signature(s) Signed: 08/02/2015 9:46:59 AM By: Curtis Sitesorthy, Joanna Entered By: Curtis Sitesorthy, Joanna on 08/02/2015 09:46:59 Cashwell, Paul HalfERESA A. ([id number]) -------------------------------------------------------------------------------- Patient/Caregiver Education Details Patient Name: [name], [name] Date of Service: 08/02/2015 9:15 AM Medical Record Patient Account Number: [id number] Number: Treating RN: Curtis SitesDorthy, Joanna 12-Aug-1965 (50 y.o. Other Clinician: Date of Birth/Gender: Female) Treating ROBSON, MICHAEL Primary Care Physician:  Lawerance Bach, Harriett Physician/Extender: G Referring Physician: Glade Nurse in Treatment: 1 Education Assessment Education Provided To: Patient Education Topics Provided Wound/Skin Impairment: Handouts: Other: go see your ortho MD right now about exposed hardware Methods:  Explain/Verbal Responses: State content correctly Electronic Signature(s) Signed: 08/02/2015 2:38:26 PM By: Curtis Sites Entered By: Curtis Sites on 08/02/2015 14:38:26 Mcclaran, Jeslynn AMarland Kitchen ([id number]) -------------------------------------------------------------------------------- Wound Assessment Details Patient Name: [name] A. Date of Service: 08/02/2015 9:15 AM Medical Record Patient Account Number: [id number] Number: Treating RN: Curtis Sites [birth year] (50 y.o. Other Clinician: Date of Birth/Sex: Female) Treating ROBSON, MICHAEL Primary Care Physician: Fulton Mole Physician/Extender: G Referring Physician: Fulton Mole Weeks in Treatment: 1 Wound Status Wound Number: 1 Primary Open Surgical Wound Etiology: Wound Location: Left Lower Leg - Lateral, Proximal Wound Open Status: Wounding Event: Surgical Injury Comorbid Asthma, Chronic Obstructive Date Acquired: 07/03/2015 History: Pulmonary Disease (COPD), Weeks Of Treatment: 1 Hypertension, Type II Diabetes Clustered Wound: No Photos Photo Uploaded By: Curtis Sites on 08/02/2015 16:06:50 Wound Measurements Length: (cm) 3.2 Width: (cm) 3.3 Depth: (cm) 3 Area: (cm) 8.294 Volume: (cm) 24.881 % Reduction in Area: 32.3% % Reduction in Volume: -26.9% Epithelialization: None Tunneling: No Undermining: No Wound Description Full Thickness With Exposed Foul Odor Aft Classification: Support Structures Diabetic Severity Grade 1 (Wagner): Wound Margin: Flat and Intact Exudate Amount: Large Exudate Type: Sanguinous Exudate Color: red Lindholm, Minela A. ([id number]) er Cleansing: No Wound Bed Granulation Amount: None Present (0%) Exposed Structure Necrotic Amount: Large (67-100%) Fascia Exposed: No Necrotic Quality: Eschar Fat Layer Exposed: Yes Tendon Exposed: No Muscle Exposed: Yes Necrosis of Muscle: No Joint Exposed: Yes Bone Exposed: No Periwound Skin Texture Texture  Color No Abnormalities Noted: No No Abnormalities Noted: No Callus: No Atrophie Blanche: No Crepitus: No Cyanosis: No Excoriation: No Ecchymosis: No Fluctuance: No Erythema: Yes Friable: No Erythema Location: Circumferential Induration: No Hemosiderin Staining: No Localized Edema: No Mottled: No Rash: No Pallor: No Scarring: No Rubor: No Moisture Temperature / Pain No Abnormalities Noted: No Tenderness on Palpation: Yes Dry / Scaly: No Maceration: No Moist: Yes Wound Preparation Ulcer Cleansing: Rinsed/Irrigated with Saline Topical Anesthetic Applied: Other: lidocaine 4%, Assessment Notes hardware exposed Electronic Signature(s) Signed: 08/02/2015 9:46:22 AM By: Curtis Sites Entered By: Curtis Sites on 08/02/2015 09:46:22 Nienow, Lissandra AMarland Kitchen ([id number]) -------------------------------------------------------------------------------- Wound Assessment Details Patient Name: [name] A. Date of Service: 08/02/2015 9:15 AM Medical Record Patient Account Number: [id number] Number: Treating RN: Curtis Sites December 10, 1965 (50 y.o. Other Clinician: Date of Birth/Sex: Female) Treating ROBSON, MICHAEL Primary Care Physician: Fulton Mole Physician/Extender: G Referring Physician: Fulton Mole Weeks in Treatment: 1 Wound Status Wound Number: 2 Primary Open Surgical Wound Etiology: Wound Location: Left Lower Leg - Lateral, Distal Wound Open Wounding Event: Surgical Injury Status: Date Acquired: 07/03/2015 Comorbid Asthma, Chronic Obstructive Weeks Of Treatment: 1 History: Pulmonary Disease (COPD), Clustered Wound: No Hypertension, Type II Diabetes Photos Photo Uploaded By: Curtis Sites on 08/02/2015 16:07:24 Wound Measurements Length: (cm) 3.4 Width: (cm) 0.5 Depth: (cm) 0.3 Area: (cm) 1.335 Volume: (cm) 0.401 % Reduction in Area: -9% % Reduction in Volume: -9% Epithelialization: None Tunneling: No Undermining: No Wound  Description Classification: Partial Thickness Diabetic Severity [address]): Grade 1 Wound Margin: Flat and Intact Exudate Amount: Small Exudate Type: Sanguinous Exudate Color: red Foul Odor After Cleansing: No Wound Bed Granulation Amount: Small (1-33%) Exposed Structure Gaut, Zillah A. ([id number]) Granulation Quality: Pink Fascia Exposed: No Necrotic Amount: Large (67-100%) Fat Layer Exposed: No Necrotic Quality: Eschar, Adherent Slough Tendon Exposed: No  Muscle Exposed: No Joint Exposed: No Bone Exposed: No Limited to Skin Breakdown Periwound Skin Texture Texture Color No Abnormalities Noted: No No Abnormalities Noted: No Callus: No Atrophie Blanche: No Crepitus: No Cyanosis: No Excoriation: No Ecchymosis: No Fluctuance: No Erythema: No Friable: No Hemosiderin Staining: No Induration: No Mottled: No Localized Edema: No Pallor: No Rash: No Rubor: No Scarring: No Temperature / Pain Moisture Tenderness on Palpation: Yes No Abnormalities Noted: No Dry / Scaly: No Maceration: No Moist: Yes Wound Preparation Ulcer Cleansing: Rinsed/Irrigated with Saline Topical Anesthetic Applied: Other: lidocaine 4%, Electronic Signature(s) Signed: 08/02/2015 9:46:42 AM By: Curtis Sites Entered By: Curtis Sites on 08/02/2015 09:46:42 Balboa, Paul [name] ([id number]) -------------------------------------------------------------------------------- Vitals Details Patient Name: [name] A. Date of Service: 08/02/2015 9:15 AM Medical Record Patient Account Number: [id number] Number: Treating RN: Curtis Sites [birth year] (50 y.o. Other Clinician: Date of Birth/Sex: Female) Treating ROBSON, MICHAEL Primary Care Physician: Fulton Mole Physician/Extender: G Referring Physician: Fulton Mole Weeks in Treatment: 1 Vital Signs Time Taken: 09:26 Temperature (F): 98.1 Height (in): 62 Pulse (bpm): 80 Weight (lbs): 235 Respiratory Rate (breaths/min):  20 Body Mass Index (BMI): 43 Blood Pressure (mmHg): 121/81 Reference Range: 80 - 120 mg / dl Electronic Signature(s) Signed: 08/02/2015 5:02:56 PM By: Curtis Sites Entered By: Curtis Sites on 08/02/2015 09:26:16

## 2015-08-03 NOTE — Progress Notes (Signed)
Lucianne MussSCOGGINS, [name] ([id number]) Visit Report for 08/02/2015 Chief Complaint Document Details Patient Name: [name], [name] Date of Service: 08/02/2015 9:15 AM Medical Record Patient Account Number: [id number] Number: Treating RN: Brittany SitesDorthy, Brittany 03-Sep-1965 (50 y.o. Other Clinician: Date of Birth/Sex: Female) Treating [name] Primary Care Physician/Extender: [name], [name] Physician: Referring Physician: Fulton MoleBurns, [name] Weeks in Treatment: 1 Information Obtained from: Patient Chief Complaint Patient arrives here acutely referred by her orthopedic surgeon Dr. Martha [name] for a nonhealing surgical wound on the left lateral leg Electronic Signature(s) Signed: 08/02/2015 4:30:01 PM By: [name], Brittany Mcnally MD Entered By: [name], [name] on 08/02/2015 11:34:38 Chiong, Paul HalfERESA A. ([id number]) -------------------------------------------------------------------------------- HPI Details Patient Name: [name], [name] Date of Service: 08/02/2015 9:15 AM Medical Record Patient Account Number: [id number] Number: Treating RN: Brittany SitesDorthy, Brittany 03-Sep-1965 (50 y.o. Other Clinician: Date of Birth/Sex: Female) Treating [name] Primary Care Physician/Extender: [name], [name] Physician: Referring Physician: Fulton MoleBurns, [name] Weeks in Treatment: 1 History of Present Illness HPI Description: 07/25/15; this is a patient who twisted her left leg earlier this month. She felt a popping sound and extreme pain. She was admitted to the hospital with a left lateral tibial plateau fracture and underwent an ORIF on March 5. She was in the hospital for 3 days. And then discharged home. She was on Lovenox 40 mg every 12 hours. She told me she is on insulin for her diabetes although I don't exactly see that on her drug list. I think the Lovenox is for DVT prophylaxis and that really doesn't need to be every 12 hours I think we can cut that down to once a day. Unfortunately she had  already left the clinic by the time I noticed this. In any case she apparently was doing fairly well. Her staples were taken out at her orthopedic surgeon's office on March 20. She went to see her orthopedic surgeon yesterday and unfortunately she had a dehisced since in the middle of the surgical wound with a clot. She also has 2 small areas inferior to this on the surgical line that are concerning. Augmentin and Septra were prescribed by Dr. Martha [name]. She hasn't yet started them but has the medications. She has no known history of diabetic neuropathy or PAD 08/02/15; the patient comes back in this week. Unfortunately the base of her wound is deteriorated. Last week I can only see a small screw however this week the base of the wound is dominated by plates and screws. There are 2 draining areas in the incision inferiorly. Culture from last week grew enterococcus cloacae I which should've been sensitive to the Augmentin and Septra. She has not been systemically unwell. Electronic Signature(s) Signed: 08/02/2015 4:30:01 PM By: [name], Brittany Maddocks MD Entered By: [name], [name] on 08/02/2015 11:36:28 Lucianne MussSCOGGINS, [name] ([id number]) -------------------------------------------------------------------------------- Physical Exam Details Patient Name: [name], [name] Date of Service: 08/02/2015 9:15 AM Medical Record Patient Account Number: [id number] Number: Treating RN: Brittany SitesDorthy, Brittany 03-Sep-1965 (50 y.o. Other Clinician: Date of Birth/Sex: Female) Treating [name] Primary Care Physician/Extender: [name], [name] Physician: Referring Physician: Fulton MoleBurns, [name] Weeks in Treatment: 1 Constitutional Sitting or standing Blood Pressure is within target range for patient.. Pulse regular and within target range for patient.[name]. Respirations regular, non-labored and within target range.. Temperature is normal and within the target range for the patient.. she does not appear systemically  unwell. Respiratory Respiratory effort is easy and symmetric bilaterally. Rate is normal at rest and on room air.. Bilateral breath sounds are clear and equal in  all lobes with no wheezes, rales or rhonchi.. Cardiovascular Heart rhythm and rate regular, without murmur or gallop.. Pedal pulses palpable and strong bilaterally.. Lymphatic None in the popliteal or inguinal areas are palpable.. Musculoskeletal Her left knee may have a small effusion although this is not tender or warm. Psychiatric Patient appears desponded about the condition of her wound however not excessive. Notes Wound exam; the area is just below the lateral left fibular head to the base of the wound has extensive exposed hardware this is a lot worse than last week. 2 small open areas inferiorly and the incision are not obviously draining. There is some discoloration around the area but no obvious tenderness or crepitus. Electronic Signature(s) Signed: 08/02/2015 4:30:01 PM By: Brittany Najjar MD Entered By: Brittany Najjar on 08/02/2015 11:39:12 Graven, [name] ([id number]) -------------------------------------------------------------------------------- Physician Orders Details Patient Name: Brittany Low A. Date of Service: 08/02/2015 9:15 AM Medical Record Patient Account Number: [id number] Number: Treating RN: [name] 1965/06/06 (50 y.o. Other Clinician: Date of Birth/Sex: Female) Treating [name] Primary Care Physician/Extender: Brittany Marble, [name] Physician: Referring Physician: Fulton Mole Weeks in Treatment: 1 Verbal / Phone Orders: Yes Clinician: Curtis [name] Read Back and Verified: Yes Diagnosis Coding ICD-10 Coding Code Description Disruption of external operation (surgical) wound, not elsewhere classified, initial T81.31XA encounter S82.102A Unspecified fracture of upper end of left tibia, initial encounter for closed fracture E11.622 Type 2 diabetes mellitus with  other skin ulcer Wound Cleansing Wound #1 Left,Proximal,Lateral Lower Leg o Clean wound with Normal Saline. Wound #2 Left,Distal,Lateral Lower Leg o Clean wound with Normal Saline. Anesthetic Wound #1 Left,Proximal,Lateral Lower Leg o Topical Lidocaine 4% cream applied to wound bed prior to debridement Wound #2 Left,Distal,Lateral Lower Leg o Topical Lidocaine 4% cream applied to wound bed prior to debridement Skin Barriers/Peri-Wound Care Wound #1 Left,Proximal,Lateral Lower Leg o Skin Prep Wound #2 Left,Distal,Lateral Lower Leg o Skin Prep Primary Wound Dressing Wound #1 Left,Proximal,Lateral Lower Leg o Prisma Ag - or equivalent and lightly pack over silver collagen dressing with saline moistened gauze o Other: - moist saline gauze in office - patient sent to Dr Burna Sis office [name], [name] ([id number]) Wound #2 Left,Distal,Lateral Lower Leg o Prisma Ag - or equivalent Secondary Dressing Wound #1 Left,Proximal,Lateral Lower Leg o ABD and Kerlix/Conform Wound #2 Left,Distal,Lateral Lower Leg o ABD and Kerlix/Conform Dressing Change Frequency Wound #1 Left,Proximal,Lateral Lower Leg o Change dressing every other day. Wound #2 Left,Distal,Lateral Lower Leg o Change dressing every other day. Follow-up Appointments Wound #1 Left,Proximal,Lateral Lower Leg o Return Appointment in 1 week. Wound #2 Left,Distal,Lateral Lower Leg o Return Appointment in 1 week. Home Health Wound #1 Left,Proximal,Lateral Lower Leg o Continue Home Health Visits o Home Health Nurse may visit PRN to address patientos wound care needs. o FACE TO FACE ENCOUNTER: MEDICARE and MEDICAID PATIENTS: I certify that this patient is under my care and that I had a face-to-face encounter that meets the physician face-to-face encounter requirements with this patient on this date. The encounter with the patient was in whole or in part for the following MEDICAL  CONDITION: (primary reason for Home Healthcare) MEDICAL NECESSITY: I certify, that based on my findings, NURSING services are a medically necessary home health service. HOME BOUND STATUS: I certify that my clinical findings support that this patient is homebound (i.e., Due to illness or injury, pt requires aid of supportive devices such as crutches, cane, wheelchairs, walkers, the use of special transportation or the assistance of another person to leave  their place of residence. There is a normal inability to leave the home and doing so requires considerable and taxing effort. Other absences are for medical reasons / religious services and are infrequent or of short duration when for other reasons). o If current dressing causes regression in wound condition, may D/C ordered dressing product/s and apply Normal Saline Moist Dressing daily until next Wound Healing Center / Other MD appointment. Notify Wound Healing Center of regression in wound condition at [phone]. o Please direct any NON-WOUND related issues/requests for orders to patient's Primary Care Physician Wound #2 Left,Distal,Lateral Lower Leg [name], [name] ([id number]) o Continue Home Health Visits o Home Health Nurse may visit PRN to address patientos wound care needs. o FACE TO FACE ENCOUNTER: MEDICARE and MEDICAID PATIENTS: I certify that this patient is under my care and that I had a face-to-face encounter that meets the physician face-to-face encounter requirements with this patient on this date. The encounter with the patient was in whole or in part for the following MEDICAL CONDITION: (primary reason for Home Healthcare) MEDICAL NECESSITY: I certify, that based on my findings, NURSING services are a medically necessary home health service. HOME BOUND STATUS: I certify that my clinical findings support that this patient is homebound (i.e., Due to illness or injury, pt requires aid of supportive devices such  as crutches, cane, wheelchairs, walkers, the use of special transportation or the assistance of another person to leave their place of residence. There is a normal inability to leave the home and doing so requires considerable and taxing effort. Other absences are for medical reasons / religious services and are infrequent or of short duration when for other reasons). o If current dressing causes regression in wound condition, may D/C ordered dressing product/s and apply Normal Saline Moist Dressing daily until next Wound Healing Center / Other MD appointment. Notify Wound Healing Center of regression in wound condition at [phone]. o Please direct any NON-WOUND related issues/requests for orders to patient's Primary Care Physician Electronic Signature(s) Signed: 08/02/2015 2:10:56 PM By: [name] Signed: 08/02/2015 4:30:01 PM By: Brittany Najjar MD Entered By: [name] on 08/02/2015 14:10:56 Koziol, [name] ([id number]) -------------------------------------------------------------------------------- Problem List Details Patient Name: Brittany Low A. Date of Service: 08/02/2015 9:15 AM Medical Record Patient Account Number: [id number] Number: Treating RN: [name] [birth year] (50 y.o. Other Clinician: Date of Birth/Sex: Female) Treating Camyla Camposano Primary Care Physician/Extender: Brittany Marble, [name] Physician: Referring Physician: Fulton Mole Weeks in Treatment: 1 Active Problems ICD-10 Encounter Code Description Active Date Diagnosis T81.31XA Disruption of external operation (surgical) wound, not 07/25/2015 Yes elsewhere classified, initial encounter S82.102A Unspecified fracture of upper end of left tibia, initial 07/25/2015 Yes encounter for closed fracture E11.622 Type 2 diabetes mellitus with other skin ulcer 07/25/2015 Yes Inactive Problems Resolved Problems Electronic Signature(s) Signed: 08/02/2015 4:30:01 PM By: Brittany Najjar  MD Entered By: Brittany Najjar on 08/02/2015 11:34:23 Coverdale, [name] ([id number]) -------------------------------------------------------------------------------- Progress Note Details Patient Name: Brittany Low A. Date of Service: 08/02/2015 9:15 AM Medical Record Patient Account Number: [id number] Number: Treating RN: [name] May 25, 1965 (50 y.o. Other Clinician: Date of Birth/Sex: Female) Treating Dayshawn Irizarry Primary Care Physician/Extender: Brittany Marble, [name] Physician: Referring Physician: Fulton Mole Weeks in Treatment: 1 Subjective Chief Complaint Information obtained from Patient Patient arrives here acutely referred by her orthopedic surgeon Dr. Martha Clan for a nonhealing surgical wound on the left lateral leg History of Present Illness (HPI) 07/25/15; this is a patient who twisted her left leg earlier this  month. She felt a popping sound and extreme pain. She was admitted to the hospital with a left lateral tibial plateau fracture and underwent an ORIF on March 5. She was in the hospital for 3 days. And then discharged home. She was on Lovenox 40 mg every 12 hours. She told me she is on insulin for her diabetes although I don't exactly see that on her drug list. I think the Lovenox is for DVT prophylaxis and that really doesn't need to be every 12 hours I think we can cut that down to once a day. Unfortunately she had already left the clinic by the time I noticed this. In any case she apparently was doing fairly well. Her staples were taken out at her orthopedic surgeon's office on March 20. She went to see her orthopedic surgeon yesterday and unfortunately she had a dehisced since in the middle of the surgical wound with a clot. She also has 2 small areas inferior to this on the surgical line that are concerning. Augmentin and Septra were prescribed by Dr. Martha Clan. She hasn't yet started them but has the medications. She has no known history  of diabetic neuropathy or PAD 08/02/15; the patient comes back in this week. Unfortunately the base of her wound is deteriorated. Last week I can only see a small screw however this week the base of the wound is dominated by plates and screws. There are 2 draining areas in the incision inferiorly. Culture from last week grew enterococcus cloacae I which should've been sensitive to the Augmentin and Septra. She has not been systemically unwell. Objective Constitutional Sitting or standing Blood Pressure is within target range for patient.. Pulse regular and within target range for patient.[name] Respirations regular, non-labored and within target range.. Temperature is normal and within the target range for the patient.. she does not appear systemically unwell. [name], [name] ([id number]) Vitals Time Taken: 9:26 AM, Height: 62 in, Weight: 235 lbs, BMI: 43, Temperature: 98.1 F, Pulse: 80 bpm, Respiratory Rate: 20 breaths/min, Blood Pressure: 121/81 mmHg. Respiratory Respiratory effort is easy and symmetric bilaterally. Rate is normal at rest and on room air.. Bilateral breath sounds are clear and equal in all lobes with no wheezes, rales or rhonchi.. Cardiovascular Heart rhythm and rate regular, without murmur or gallop.. Pedal pulses palpable and strong bilaterally.. Lymphatic None in the popliteal or inguinal areas are palpable.. Musculoskeletal Her left knee may have a small effusion although this is not tender or warm. Psychiatric Patient appears desponded about the condition of her wound however not excessive. General Notes: Wound exam; the area is just below the lateral left fibular head to the base of the wound has extensive exposed hardware this is a lot worse than last week. 2 small open areas inferiorly and the incision are not obviously draining. There is some discoloration around the area but no obvious tenderness or crepitus. Integumentary (Hair, Skin) Wound #1 status is Open.  Original cause of wound was Surgical Injury. The wound is located on the Left,Proximal,Lateral Lower Leg. The wound measures 3.2cm length x 3.3cm width x 3cm depth; 8.294cm^2 area and 24.881cm^3 volume. There is joint, muscle, and fat exposed. There is no tunneling or undermining noted. There is a large amount of sanguinous drainage noted. The wound margin is flat and intact. There is no granulation within the wound bed. There is a large (67-100%) amount of necrotic tissue within the wound bed including Eschar. The periwound skin appearance exhibited: Moist, Erythema. The periwound skin appearance  did not exhibit: Callus, Crepitus, Excoriation, Fluctuance, Friable, Induration, Localized Edema, Rash, Scarring, Dry/Scaly, Maceration, Atrophie Blanche, Cyanosis, Ecchymosis, Hemosiderin Staining, Mottled, Pallor, Rubor. The surrounding wound skin color is noted with erythema which is circumferential. The periwound has tenderness on palpation. General Notes: hardware exposed Wound #2 status is Open. Original cause of wound was Surgical Injury. The wound is located on the Left,Distal,Lateral Lower Leg. The wound measures 3.4cm length x 0.5cm width x 0.3cm depth; 1.335cm^2 area and 0.401cm^3 volume. The wound is limited to skin breakdown. There is no tunneling or undermining noted. There is a small amount of sanguinous drainage noted. The wound margin is flat and intact. There is small (1-33%) pink granulation within the wound bed. There is a large (67-100%) amount of necrotic tissue within the wound bed including Eschar and Adherent Slough. The periwound skin appearance exhibited: Moist. The periwound skin appearance did not exhibit: Callus, Crepitus, Excoriation, Fluctuance, Friable, Induration, Localized Edema, Rash, Scarring, Dry/Scaly, Maceration, Atrophie Blanche, Cyanosis, Ecchymosis, Hemosiderin Staining, Mottled, Pallor, Rubor, Erythema. The periwound has tenderness on palpation. [name],  [name] ([id number]) Assessment Active Problems ICD-10 T81.31XA - Disruption of external operation (surgical) wound, not elsewhere classified, initial encounter S82.102A - Unspecified fracture of upper end of left tibia, initial encounter for closed fracture E11.622 - Type 2 diabetes mellitus with other skin ulcer Plan #1 I've spoken to her orthopedic surgeon on the phone to get her into see in the office this morning. He states he feels she needs plastic surgery for some attempt at closure of this. His office will arrange this. For now we simply place that wet to dry dressings on this. The patient expressed a strong desire to continue to see [place] although I'm not sure for the moment we will have much to offer outside of what orthopedics and plastic surgery would plan for her. Electronic Signature(s) Signed: 08/02/2015 4:30:01 PM By: Brittany Najjar MD Entered By: Brittany Najjar on 08/02/2015 11:40:29 Gaglio, [name] ([id number]) -------------------------------------------------------------------------------- SuperBill Details Patient Name: Brittany Low A. Date of Service: 08/02/2015 Medical Record Patient Account Number: [id number] Number: Treating RN: [name] 04/10/1966 (50 y.o. Other Clinician: Date of Birth/Sex: Female) Treating Leeta Grimme Primary Care Physician/Extender: Brittany Marble, [name] Physician: Weeks in Treatment: 1 Referring Physician: Fulton Mole Diagnosis Coding ICD-10 Codes Code Description Disruption of external operation (surgical) wound, not elsewhere classified, initial T81.31XA encounter S82.102A Unspecified fracture of upper end of left tibia, initial encounter for closed fracture E11.622 Type 2 diabetes mellitus with other skin ulcer Facility Procedures CPT4 Code: [id number] Description: 99213 - WOUND CARE VISIT-LEV 3 EST PT Modifier: Quantity: 1 Physician Procedures CPT4: Description Modifier Quantity Code [id number] 99214 -  WC PHYS LEVEL 4 - EST PT 1 ICD-10 Description Diagnosis T81.31XA Disruption of external operation (surgical) wound, not elsewhere classified, initial encounter Electronic Signature(s) Signed: 08/02/2015 2:35:44 PM By: [name] Signed: 08/02/2015 4:30:01 PM By: Brittany Najjar MD Entered By: [name] on 08/02/2015 14:35:44

## 2015-08-10 DIAGNOSIS — F17200 Nicotine dependence, unspecified, uncomplicated: Secondary | ICD-10-CM | POA: Insufficient documentation

## 2015-08-15 ENCOUNTER — Ambulatory Visit: Payer: Medicaid Other | Admitting: Internal Medicine

## 2015-09-06 ENCOUNTER — Ambulatory Visit: Payer: Medicaid Other | Admitting: Internal Medicine

## 2015-09-13 ENCOUNTER — Encounter: Payer: Medicaid Other | Attending: Internal Medicine | Admitting: Internal Medicine

## 2015-09-13 ENCOUNTER — Other Ambulatory Visit
Admission: RE | Admit: 2015-09-13 | Discharge: 2015-09-13 | Disposition: A | Discharge status: Home / Self Care | Payer: Medicaid Other | Source: Ambulatory Visit | Attending: Internal Medicine | Admitting: Internal Medicine

## 2015-09-13 DIAGNOSIS — Z794 Long term (current) use of insulin: Secondary | ICD-10-CM | POA: Insufficient documentation

## 2015-09-13 DIAGNOSIS — E11622 Type 2 diabetes mellitus with other skin ulcer: Secondary | ICD-10-CM | POA: Insufficient documentation

## 2015-09-13 DIAGNOSIS — F17200 Nicotine dependence, unspecified, uncomplicated: Secondary | ICD-10-CM | POA: Insufficient documentation

## 2015-09-13 DIAGNOSIS — J45909 Unspecified asthma, uncomplicated: Secondary | ICD-10-CM | POA: Diagnosis not present

## 2015-09-13 DIAGNOSIS — T8131XD Disruption of external operation (surgical) wound, not elsewhere classified, subsequent encounter: Secondary | ICD-10-CM | POA: Insufficient documentation

## 2015-09-13 DIAGNOSIS — J449 Chronic obstructive pulmonary disease, unspecified: Secondary | ICD-10-CM | POA: Diagnosis not present

## 2015-09-13 DIAGNOSIS — Y839 Surgical procedure, unspecified as the cause of abnormal reaction of the patient, or of later complication, without mention of misadventure at the time of the procedure: Secondary | ICD-10-CM | POA: Insufficient documentation

## 2015-09-13 DIAGNOSIS — I10 Essential (primary) hypertension: Secondary | ICD-10-CM | POA: Diagnosis not present

## 2015-09-13 DIAGNOSIS — Z8249 Family history of ischemic heart disease and other diseases of the circulatory system: Secondary | ICD-10-CM | POA: Diagnosis not present

## 2015-09-13 DIAGNOSIS — M869 Osteomyelitis, unspecified: Secondary | ICD-10-CM | POA: Diagnosis not present

## 2015-09-13 LAB — CBC WITH DIFFERENTIAL/PLATELET
BAND NEUTROPHILS: 0 %
BASOS PCT: 0 %
Basophils Absolute: 0 10*3/uL (ref 0–0.1)
Blasts: 0 %
EOS ABS: 0.2 10*3/uL (ref 0–0.7)
EOS PCT: 4 %
HCT: 44.7 % (ref 35.0–47.0)
Hemoglobin: 14.8 g/dL (ref 12.0–16.0)
LYMPHS ABS: 1.8 10*3/uL (ref 1.0–3.6)
LYMPHS PCT: 35 %
MCH: 30.8 pg (ref 26.0–34.0)
MCHC: 33.2 g/dL (ref 32.0–36.0)
MCV: 92.8 fL (ref 80.0–100.0)
MONO ABS: 0.3 10*3/uL (ref 0.2–0.9)
Metamyelocytes Relative: 0 %
Monocytes Relative: 5 %
Myelocytes: 0 %
NEUTROS ABS: 2.8 10*3/uL (ref 1.4–6.5)
NEUTROS PCT: 56 %
NRBC: 0 /100{WBCs}
OTHER: 0 %
PLATELETS: 102 10*3/uL — AB (ref 150–440)
Promyelocytes Absolute: 0 %
RBC: 4.81 MIL/uL (ref 3.80–5.20)
RDW: 14.7 % — AB (ref 11.5–14.5)
WBC: 5.1 10*3/uL (ref 3.6–11.0)

## 2015-09-13 LAB — CREATININE, SERUM
Creatinine, Ser: 0.55 mg/dL (ref 0.44–1.00)
GFR calc non Af Amer: >60 mL/min (ref >60)

## 2015-09-13 LAB — BUN: BUN: 10 mg/dL (ref 6–20)

## 2015-09-14 NOTE — Progress Notes (Signed)
[name], [name] ([id number]) Visit Report for 09/13/2015 Abuse/Suicide Risk Screen Details Patient Name: [name], [name] Date of Service: 09/13/2015 9:30 AM Medical Record Patient Account Number: [id number] Number: Treating RN: [name] 09-05-1965 (50 y.o. Other Clinician: Date of Birth/Sex: Female) Treating [name] Primary Care Physician/Extender: [name] Physician: Referring Physician: Fulton [name] Weeks in Treatment: 0 Abuse/Suicide Risk Screen Items Answer ABUSE/SUICIDE RISK SCREEN: Has anyone close to you tried to hurt or harm you recentlyo No Do you feel uncomfortable with anyone in your familyo No Has anyone forced you do things that you didnot want to doo No Do you have any thoughts of harming yourselfo No Patient displays signs or symptoms of abuse and/or neglect. No Electronic Signature(s) Signed: 09/13/2015 5:19:35 PM By: [name] Entered By: [name] on 09/13/2015 09:39:40 [name] ([id number]) -------------------------------------------------------------------------------- Activities of Daily Living Details Patient Name: Brittany Low A. Date of Service: 09/13/2015 9:30 AM Medical Record Patient Account Number: [id number] Number: Treating RN: [name] Jun 22, 1965 (50 y.o. Other Clinician: Date of Birth/Sex: Female) Treating [name] Primary Care Physician/Extender: [name] Physician: Referring Physician: Fulton [name] Weeks in Treatment: 0 Activities of Daily Living Items Answer Activities of Daily Living (Please select one for each item) Drive Automobile Not Able Take Medications Completely Able Use Telephone Completely Able Care for Appearance Completely Able Use Toilet Completely Able Bath / Shower Need Assistance Dress Self Completely Able Feed Self Completely Able Walk Need Assistance Get In / Out Bed Need Assistance Housework Not  Able Prepare Meals Not Able Handle Money Completely Able Shop for Self Not Able Electronic Signature(s) Signed: 09/13/2015 5:19:35 PM By: [name] Entered By: [name] on 09/13/2015 09:40:22 [name] ([id number]) -------------------------------------------------------------------------------- Education Assessment Details Patient Name: Brittany Low A. Date of Service: 09/13/2015 9:30 AM Medical Record Patient Account Number: [id number] Number: Treating RN: [name] [birth year] (50 y.o. Other Clinician: Date of Birth/Sex: Female) Treating [name] Primary Care Physician/Extender: [name] Physician: Referring Physician: Glade [name] in Treatment: 0 Primary Learner Assessed: Patient Learning Preferences/Education Level/Primary Language Learning Preference: Explanation, Printed Material Cognitive Barrier Assessment/Beliefs Language Barrier: No Translator Needed: No Memory Deficit: No Emotional Barrier: No Cultural/Religious Beliefs Affecting Medical No Care: Physical Barrier Assessment Impaired Vision: No Impaired Hearing: No Decreased Hand dexterity: No Knowledge/Comprehension Assessment Knowledge Level: High Comprehension Level: High Ability to understand written High instructions: Ability to understand verbal High instructions: Motivation Assessment Anxiety Level: Calm Cooperation: Cooperative Education Importance: Acknowledges Need Interest in Health Problems: Asks Questions Perception: Coherent Willingness to Engage in Self- High Management Activities: Readiness to Engage in Self- High Management Activities: [name] ([id number]) Electronic Signature(s) Signed: 09/13/2015 5:19:35 PM By: [name] Entered By: [name] on 09/13/2015 09:40:44 [name] ([id number]) -------------------------------------------------------------------------------- Fall Risk  Assessment Details Patient Name: Brittany Low A. Date of Service: 09/13/2015 9:30 AM Medical Record Patient Account Number: [id number] Number: Treating RN: [name] 02-28-1966 (50 y.o. Other Clinician: Date of Birth/Sex: Female) Treating [name] Primary Care Physician/Extender: [name] Physician: Referring Physician: Fulton [name] Weeks in Treatment: 0 Fall Risk Assessment Items Have you had 2 or more falls in the last 12 monthso 0 No Have you had any fall that resulted in injury in the last 12 monthso 0 Yes FALL RISK ASSESSMENT: History of falling - immediate or within 3 months 25 Yes Secondary diagnosis 15 Yes Ambulatory aid None/bed rest/wheelchair/[name] 0 Yes Crutches/cane/walker 0 No Furniture 0 No IV Access/Saline  Lock 0 No Gait/Training Normal/bed rest/immobile 0 No Weak 10 Yes Impaired 20 Yes Mental Status Oriented to own ability 0 Yes Electronic Signature(s) Signed: 09/13/2015 5:19:35 PM By: Brittany MullingPinkerton, [name] Entered By: Brittany MullingPinkerton, [name] on 09/13/2015 09:41:23 [name] Kitchen. ([id number]) -------------------------------------------------------------------------------- Foot Assessment Details Patient Name: [name], [name] Date of Service: 09/13/2015 9:30 AM Medical Record Patient Account Number: [id number] Number: Treating RN: Brittany Haggisinkerton, [name] Jul 20, 1965 (50 y.o. Other Clinician: Date of Birth/Sex: Female) Treating [name] Primary Care Physician/Extender: Brittany MarbleG Burns, [name] Physician: Referring Physician: Fulton MoleBurns, [name] Weeks in Treatment: 0 Foot Assessment Items Site Locations + = Sensation present, - = Sensation absent, C = Callus, U = Ulcer R = Redness, W = Warmth, M = Maceration, PU = Pre-ulcerative lesion F = Fissure, S = Swelling, D = Dryness Assessment Right: Left: Other Deformity: No No Prior Foot Ulcer: No No Prior Amputation: No No Charcot Joint: No No Ambulatory Status:  Non-ambulatory Assistance Device: Wheelchair Gait: Surveyor, miningUnsteady Electronic Signature(s) Signed: 09/13/2015 5:19:35 PM By: [name] Patch, Paul HalfERESA A. ([id number]) Entered By: Brittany MullingPinkerton, [name] on 09/13/2015 09:42:29 Sara, Paul HalfERESA A. ([id number]) -------------------------------------------------------------------------------- Nutrition Risk Assessment Details Patient Name: [name], [name] Date of Service: 09/13/2015 9:30 AM Medical Record Patient Account Number: [id number] Number: Treating RN: Brittany Haggisinkerton, [name] Jul 20, 1965 (50 y.o. Other Clinician: Date of Birth/Sex: Female) Treating [name] Primary Care Physician/Extender: Brittany MarbleG Burns, [name] Physician: Referring Physician: Fulton MoleBurns, [name] Weeks in Treatment: 0 Height (in): 62 Weight (lbs): 233 Body Mass Index (BMI): 42.6 Nutrition Risk Assessment Items NUTRITION RISK SCREEN: I have an illness or condition that made me change the kind and/or 2 Yes amount of food I eat I eat fewer than two meals per day 3 Yes I eat few fruits and vegetables, or milk products 0 No I have three or more drinks of beer, liquor or wine almost every day 0 No I have tooth or mouth problems that make it hard for me to eat 0 No I don't always have enough money to buy the food I need 0 No I eat alone most of the time 0 No I take three or more different prescribed or over-the-counter drugs a 1 Yes day Without wanting to, I have lost or gained 10 pounds in the last six 2 Yes months I am not always physically able to shop, cook and/or feed myself 0 No Nutrition Protocols Good Risk Protocol Moderate Risk Protocol Electronic Signature(s) Signed: 09/13/2015 5:19:35 PM By: Brittany MullingPinkerton, [name] Entered By: Brittany MullingPinkerton, [name] on 09/13/2015 09:41:37

## 2015-09-14 NOTE — Progress Notes (Signed)
Brittany, [name] ([id number]) Visit Report for 09/13/2015 Allergy List Details Patient Name: [name], [name]. Date of Service: 09/13/2015 9:30 AM Medical Record Patient Account Number: [id number] Number: Treating RN: Phillis Haggis 26-Jun-1965 (50 y.o. Other Clinician: Date of Birth/Sex: Female) Treating ROBSON, MICHAEL Primary Care Physician: Fulton Mole Physician/Extender: G Referring Physician: Fulton Mole Weeks in Treatment: 0 Allergies Active Allergies No Known Allergies Allergy Notes Electronic Signature(s) Signed: 09/13/2015 5:19:35 PM By: Alejandro Mulling Entered By: Alejandro Mulling on 09/13/2015 09:52:13 Caras, [name] ([id number]) -------------------------------------------------------------------------------- Arrival Information Details Patient Name: [name]. Date of Service: 09/13/2015 9:30 AM Medical Record Patient Account Number: [id number] Number: Treating RN: Phillis Haggis 29-Jun-1965 (50 y.o. Other Clinician: Date of Birth/Sex: Female) Treating ROBSON, MICHAEL Primary Care Physician: Fulton Mole Physician/Extender: G Referring Physician: Glade Nurse in Treatment: 0 Visit Information Patient Arrived: Wheel Chair Arrival Time: 09:28 Accompanied By: self Transfer Assistance: Hoyer Lift Patient Identification Verified: Yes Secondary Verification Process Yes Completed: Patient Requires Transmission-Based No Precautions: Patient Has Alerts: Yes Patient Alerts: DM II History Since Last Visit All ordered tests and consults were completed: No Added or deleted any medications: No Any new allergies or adverse reactions: No Had [name] fall or experienced change in activities of daily living that may affect risk of falls: No Signs or symptoms of abuse/neglect since last visito No Hospitalized since last visit: No Pain Present Now: Yes Electronic Signature(s) Signed: 09/13/2015 5:19:35 PM By: Alejandro Mulling Entered By: Alejandro Mulling on 09/13/2015 09:30:10 Lucianne Muss ([id number]) -------------------------------------------------------------------------------- Clinic Level of Care Assessment Details Patient Name: [name]. Date of Service: 09/13/2015 9:30 AM Medical Record Patient Account Number: [id number] Number: Treating RN: Phillis Haggis Sep 16, 1965 (50 y.o. Other Clinician: Date of Birth/Sex: Female) Treating ROBSON, MICHAEL Primary Care Physician: Fulton Mole Physician/Extender: G Referring Physician: Fulton Mole Weeks in Treatment: 0 Clinic Level of Care Assessment Items TOOL 2 Quantity Score X - Use when only an EandM is performed on the INITIAL visit 1 0 ASSESSMENTS - Nursing Assessment / Reassessment X - General Physical Exam (combine w/ comprehensive assessment (listed just 1 20 below) when performed on new pt. evals) X - Comprehensive Assessment (HX, ROS, Risk Assessments, Wounds Hx, etc.) 1 25 ASSESSMENTS - Wound and Skin Assessment / Reassessment  - Simple Wound Assessment / Reassessment - one wound 0 X - Complex Wound Assessment / Reassessment - multiple wounds 2 5  - Dermatologic / Skin Assessment (not related to wound area) 0 ASSESSMENTS - Ostomy and/or Continence Assessment and Care  - Incontinence Assessment and Management 0  - Ostomy Care Assessment and Management (repouching, etc.) 0 PROCESS - Coordination of Care  - Simple Patient / Family Education for ongoing care 0 X - Complex (extensive) Patient / Family Education for ongoing care 1 20 X - Staff obtains Chiropractor, Records, Test Results / Process Orders 1 10  - Staff telephones HHA, Nursing Homes / Clarify orders / etc 0  - Routine Transfer to another Facility (non-emergent condition) 0  - Routine Hospital Admission (non-emergent condition) 0 X - New Admissions / Manufacturing engineer / Ordering NPWT, Apligraf, etc. 1 15  - Emergency Hospital  Admission (emergent condition) 0 Brittany, Jaquel [name]. ([id number]) X - Simple Discharge Coordination 1 10  - Complex (extensive) Discharge Coordination 0 PROCESS - Special Needs  - Pediatric / Minor Patient Management 0  - Isolation Patient Management 0  - Hearing / Language / Visual special needs 0  - Assessment of Community assistance (  transportation, D/C planning, etc.) 0 []  - Additional assistance / Altered mentation 0 []  - Support Surface(s) Assessment (bed, cushion, seat, etc.) 0 INTERVENTIONS - Wound Cleansing / Measurement X - Wound Imaging (photographs - any number of wounds) 1 5 []  - Wound Tracing (instead of photographs) 0 []  - Simple Wound Measurement - one wound 0 X - Complex Wound Measurement - multiple wounds 2 5 []  - Simple Wound Cleansing - one wound 0 X - Complex Wound Cleansing - multiple wounds 2 5 INTERVENTIONS - Wound Dressings []  - Small Wound Dressing one or multiple wounds 0 []  - Medium Wound Dressing one or multiple wounds 0 X - Large Wound Dressing one or multiple wounds 1 20 []  - Application of Medications - injection 0 INTERVENTIONS - Miscellaneous []  - External ear exam 0 []  - Specimen Collection (cultures, biopsies, blood, body fluids, etc.) 0 []  - Specimen(s) / Culture(s) sent or taken to Lab for analysis 0 []  - Patient Transfer (multiple staff / Nurse, adult / Similar devices) 0 []  - Simple Staple / Suture removal (25 or less) 0 Brittany, Daisee [name]. ([id number]) []  - Complex Staple / Suture removal (26 or more) 0 []  - Hypo / Hyperglycemic Management (close monitor of Blood Glucose) 0 []  - Ankle / Brachial Index (ABI) - do not check if billed separately 0 Has the patient been seen at the hospital within the last three years: Yes Total Score: 155 Level Of Care: New/Established - Level 4 Electronic Signature(s) Signed: 09/13/2015 5:19:35 PM By: Alejandro Mulling Entered By: Alejandro Mulling on 09/13/2015 10:43:59 Tomes, [name]  ([id number]) -------------------------------------------------------------------------------- Encounter Discharge Information Details Patient Name: [name]. Date of Service: 09/13/2015 9:30 AM Medical Record Patient Account Number: [id number] Number: Treating RN: Phillis Haggis 12-Feb-1966 (50 y.o. Other Clinician: Date of Birth/Sex: Female) Treating ROBSON, MICHAEL Primary Care Physician: Fulton Mole Physician/Extender: G Referring Physician: Fulton Mole Weeks in Treatment: 0 Encounter Discharge Information Items Discharge Pain Level: 0 Discharge Condition: Stable Ambulatory Status: Wheelchair Discharge Destination: Home Transportation: Private Auto Accompanied By: self Schedule Follow-up Appointment: Yes Medication Reconciliation completed and provided to Patient/Care Yes Tameshia Bonneville: Provided on Clinical Summary of Care: 09/13/2015 Form Type Recipient Paper Patient TS Electronic Signature(s) Signed: 09/13/2015 5:19:35 PM By: Alejandro Mulling Previous Signature: 09/13/2015 10:27:08 AM Version By: Gwenlyn Perking Entered By: Alejandro Mulling on 09/13/2015 10:28:57 Arciga, [name] ([id number]) -------------------------------------------------------------------------------- Lower Extremity Assessment Details Patient Name: [name]. Date of Service: 09/13/2015 9:30 AM Medical Record Patient Account Number: [id number] Number: Treating RN: Phillis Haggis 02/25/1966 (50 y.o. Other Clinician: Date of Birth/Sex: Female) Treating ROBSON, MICHAEL Primary Care Physician: Fulton Mole Physician/Extender: G Referring Physician: Fulton Mole Weeks in Treatment: 0 Vascular Assessment Pulses: Posterior Tibial Dorsalis Pedis Palpable: [Left:Yes] Extremity colors, hair growth, and conditions: Extremity Color: [Left:Hyperpigmented] Temperature of Extremity: [Left:Warm] Capillary Refill: [Left:< 3 seconds] Electronic  Signature(s) Signed: 09/13/2015 5:19:35 PM By: Alejandro Mulling Entered By: Alejandro Mulling on 09/13/2015 09:33:26 Billard, Cherish AMarland Kitchen ([id number]) -------------------------------------------------------------------------------- Multi Wound Chart Details Patient Name: [name]. Date of Service: 09/13/2015 9:30 AM Medical Record Patient Account Number: [id number] Number: Treating RN: Phillis Haggis 05-13-1965 (50 y.o. Other Clinician: Date of Birth/Sex: Female) Treating ROBSON, MICHAEL Primary Care Physician: Fulton Mole Physician/Extender: G Referring Physician: Fulton Mole Weeks in Treatment: 0 Vital Signs Height(in): 62 Pulse(bpm): 75 Weight(lbs): 233 Blood Pressure 146/86 (mmHg): Body Mass Index(BMI): 43 Temperature(F): 97.9 Respiratory Rate 20 (breaths/min): Photos: [3:No Photos] [4:No Photos] [N/[name]:N/[name]] Wound Location: [3:Left Lower Leg - Proximal] [  4:Left Lower Leg - Distal] [N/[name]:N/[name]] Wounding Event: [3:Surgical Injury] [4:Surgical Injury] [N/[name]:N/[name]] Primary Etiology: [3:Open Surgical Wound] [4:Open Surgical Wound] [N/[name]:N/[name]] Comorbid History: [3:Asthma, Chronic Obstructive Pulmonary Disease (COPD), Hypertension, Type II Diabetes] [4:Asthma, Chronic Obstructive Pulmonary Disease (COPD), Hypertension, Type II Diabetes] [N/[name]:N/[name]] Date Acquired: [3:08/30/2015] [4:08/30/2015] [N/[name]:N/[name]] Weeks of Treatment: [3:0] [4:0] [N/[name]:N/[name]] Wound Status: [3:Open] [4:Open] [N/[name]:N/[name]] Measurements L x W x D 5.5x2x0.1 [4:1.9x0.3x0.1] [N/[name]:N/[name]] (cm) Area (cm) : [3:8.639] [4:0.448] [N/[name]:N/[name]] Volume (cm) : [3:0.864] [4:0.045] [N/[name]:N/[name]] Classification: [3:Partial Thickness] [4:Partial Thickness] [N/[name]:N/[name]] HBO Classification: [3:Grade 1] [4:Grade 1] [N/[name]:N/[name]] Exudate Amount: [3:Medium] [4:Medium] [N/[name]:N/[name]] Exudate Type: [3:Serous] [4:Serous] [N/[name]:N/[name]] Exudate Color: [3:amber] [4:amber] [N/[name]:N/[name]] Wound Margin: [3:Flat and Intact] [4:Flat and Intact]  [N/[name]:N/[name]] Granulation Amount: [3:Large (67-100%)] [4:Large (67-100%)] [N/[name]:N/[name]] Granulation Quality: [3:Pink] [4:Pink] [N/[name]:N/[name]] Necrotic Amount: [3:Small (1-33%)] [4:Small (1-33%)] [N/[name]:N/[name]] Exposed Structures: [3:Fascia: No Fat: No Tendon: No] [4:Fascia: No Fat: No Tendon: No] [N/[name]:N/[name]] Muscle: No Muscle: No Joint: No Joint: No Bone: No Bone: No Limited to Skin Limited to Skin Breakdown Breakdown Epithelialization: None None N/[name] Periwound Skin Texture: Edema: Yes Edema: Yes N/[name] Excoriation: No Excoriation: No Induration: No Induration: No Callus: No Callus: No Crepitus: No Crepitus: No Fluctuance: No Fluctuance: No Friable: No Friable: No Rash: No Rash: No Scarring: No Scarring: No Periwound Skin Moist: Yes Moist: Yes N/[name] Moisture: Maceration: No Maceration: No Dry/Scaly: No Dry/Scaly: No Periwound Skin Color: Atrophie Blanche: No Atrophie Blanche: No N/[name] Cyanosis: No Cyanosis: No Ecchymosis: No Ecchymosis: No Erythema: No Erythema: No Hemosiderin Staining: No Hemosiderin Staining: No Mottled: No Mottled: No Pallor: No Pallor: No Rubor: No Rubor: No Temperature: N/[name] No Abnormality N/[name] Tenderness on No Yes N/[name] Palpation: Wound Preparation: Ulcer Cleansing: Ulcer Cleansing: N/[name] Rinsed/Irrigated with Rinsed/Irrigated with Saline Saline Topical Anesthetic Topical Anesthetic Applied: None Applied: None Treatment Notes Electronic Signature(s) Signed: 09/13/2015 5:19:35 PM By: Alejandro Mulling Entered By: Alejandro Mulling on 09/13/2015 10:02:21 Lucianne Muss ([id number]) -------------------------------------------------------------------------------- Multi-Disciplinary Care Plan Details Patient Name: [name]. Date of Service: 09/13/2015 9:30 AM Medical Record Patient Account Number: [id number] Number: Treating RN: Phillis Haggis 12-11-1965 (50 y.o. Other Clinician: Date of Birth/Sex: Female) Treating ROBSON,  MICHAEL Primary Care Physician: Fulton Mole Physician/Extender: G Referring Physician: Fulton Mole Weeks in Treatment: 0 Active Inactive Abuse / Safety / Falls / Self Care Management Nursing Diagnoses: Potential for falls Goals: Patient will remain injury free Date Initiated: 09/13/2015 Goal Status: Active Interventions: Assess fall risk on admission and as needed Assess self care needs on admission and as needed Notes: Nutrition Nursing Diagnoses: Imbalanced nutrition Goals: Patient/caregiver agrees to and verbalizes understanding of need to use nutritional supplements and/or vitamins as prescribed Date Initiated: 09/13/2015 Goal Status: Active Interventions: Assess patient nutrition upon admission and as needed per policy Notes: Orientation to the Wound Care Program Nursing Diagnoses: Knowledge deficit related to the wound healing center program ETTY, ISAAC [name]. ([id number]) Goals: Patient/caregiver will verbalize understanding of the Wound Healing Center Program Date Initiated: 09/13/2015 Goal Status: Active Interventions: Provide education on orientation to the wound center Notes: Pain, Acute or Chronic Nursing Diagnoses: Pain, acute or chronic: actual or potential Potential alteration in comfort, pain Goals: Patient will verbalize adequate pain control and receive pain control interventions during procedures as needed Date Initiated: 09/13/2015 Goal Status: Active Patient/caregiver will verbalize adequate pain control between visits Date Initiated: 09/13/2015 Goal Status: Active Interventions: Complete pain assessment as per visit requirements Notes: Wound/Skin Impairment Nursing Diagnoses: Impaired tissue integrity Goals: Ulcer/skin breakdown will have [name] volume reduction of 30%  by week 4 Date Initiated: 09/13/2015 Goal Status: Active Ulcer/skin breakdown will have [name] volume reduction of 50% by week 8 Date Initiated: 09/13/2015 Goal Status:  Active Ulcer/skin breakdown will have [name] volume reduction of 80% by week 12 Date Initiated: 09/13/2015 Goal Status: Active NAIMAH, YINGST [name]. ([id number]) Interventions: Assess ulceration(s) every visit Notes: Electronic Signature(s) Signed: 09/13/2015 5:19:35 PM By: Alejandro Mulling Entered By: Alejandro Mulling on 09/13/2015 10:42:15 Pfluger, Ily [name]. ([id number]) -------------------------------------------------------------------------------- Pain Assessment Details Patient Name: [name]. Date of Service: 09/13/2015 9:30 AM Medical Record Patient Account Number: [id number] Number: Treating RN: Phillis Haggis 1966/02/13 (50 y.o. Other Clinician: Date of Birth/Sex: Female) Treating ROBSON, MICHAEL Primary Care Physician: Fulton Mole Physician/Extender: G Referring Physician: Fulton Mole Weeks in Treatment: 0 Active Problems Location of Pain Severity and Description of Pain Patient Has Paino Yes Site Locations Pain Location: Pain in Ulcers Rate the pain. Current Pain Level: 9 Character of Pain Describe the Pain: Aching, Throbbing Pain Management and Medication Current Pain Management: Electronic Signature(s) Signed: 09/13/2015 5:19:35 PM By: Alejandro Mulling Entered By: Alejandro Mulling on 09/13/2015 09:30:28 Lucianne Muss ([id number]) -------------------------------------------------------------------------------- Patient/Caregiver Education Details Patient Name: [name]. Date of Service: 09/13/2015 9:30 AM Medical Record Patient Account Number: [id number] Number: Treating RN: Phillis Haggis 06/18/1965 (50 y.o. Other Clinician: Date of Birth/Gender: Female) Treating ROBSON, MICHAEL Primary Care Physician: Fulton Mole Physician/Extender: G Referring Physician: Glade Nurse in Treatment: 0 Education Assessment Education Provided To: Patient Education Topics Provided Wound/Skin  Impairment: Handouts: Other: change dressing as ordered Methods: Demonstration, Explain/Verbal Responses: State content correctly Electronic Signature(s) Signed: 09/13/2015 5:19:35 PM By: Alejandro Mulling Entered By: Alejandro Mulling on 09/13/2015 10:29:14 Reagle, [name] ([id number]) -------------------------------------------------------------------------------- Wound Assessment Details Patient Name: [name]. Date of Service: 09/13/2015 9:30 AM Medical Record Patient Account Number: [id number] Number: Treating RN: Phillis Haggis [birth year] (50 y.o. Other Clinician: Date of Birth/Sex: Female) Treating ROBSON, MICHAEL Primary Care Physician: Fulton Mole Physician/Extender: G Referring Physician: Fulton Mole Weeks in Treatment: 0 Wound Status Wound Number: 3 Primary Open Surgical Wound Etiology: Wound Location: Left Lower Leg - Proximal Wound Open Wounding Event: Surgical Injury Status: Date Acquired: 08/30/2015 Comorbid Asthma, Chronic Obstructive Weeks Of Treatment: 0 History: Pulmonary Disease (COPD), Clustered Wound: No Hypertension, Type II Diabetes Photos Photo Uploaded By: Alejandro Mulling on 09/13/2015 17:18:23 Wound Measurements Length: (cm) 5.5 Width: (cm) 2 Depth: (cm) 0.1 Area: (cm) 8.639 Volume: (cm) 0.864 % Reduction in Area: % Reduction in Volume: Epithelialization: None Wound Description Classification: Partial Thickness Foul Odor [name] Diabetic Severity (Wagner): Grade 1 Wound Margin: Flat and Intact Exudate Amount: Medium Exudate Type: Serous Exudate Color: amber fter Cleansing: No Wound Bed Granulation Amount: Large (67-100%) Exposed Structure Godfrey, Luverna [name]. ([id number]) Granulation Quality: Pink Fascia Exposed: No Necrotic Amount: Small (1-33%) Fat Layer Exposed: No Necrotic Quality: Adherent Slough Tendon Exposed: No Muscle Exposed: No Joint Exposed: No Bone Exposed: No Limited to Skin  Breakdown Periwound Skin Texture Texture Color No Abnormalities Noted: No No Abnormalities Noted: No Callus: No Atrophie Blanche: No Crepitus: No Cyanosis: No Excoriation: No Ecchymosis: No Fluctuance: No Erythema: No Friable: No Hemosiderin Staining: No Induration: No Mottled: No Localized Edema: Yes Pallor: No Rash: No Rubor: No Scarring: No Moisture No Abnormalities Noted: No Dry / Scaly: No Maceration: No Moist: Yes Wound Preparation Ulcer Cleansing: Rinsed/Irrigated with Saline Topical Anesthetic Applied: None Treatment Notes Wound #3 (Left, Proximal Lower Leg) 1. Cleansed with: Clean wound with Normal Saline 4. Dressing Applied: Xeroform 5.  Secondary Dressing Applied ABD Pad Dry Gauze Kerlix/Conform 6. Footwear/Offloading device applied Ace wrap Electronic Signature(s) Signed: 09/13/2015 5:19:35 PM By: Alejandro MullingPinkerton, Debra Entered By: Alejandro MullingPinkerton, Debra on 09/13/2015 09:50:03 Montoro, Paul HalfERESA [name]. ([id number]) Karner, Mashelle AMarland Kitchen. ([id number]) -------------------------------------------------------------------------------- Wound Assessment Details Patient Name: Brittany LowSCOGGINS, Gissele [name]. Date of Service: 09/13/2015 9:30 AM Medical Record Patient Account Number: [id number] Number: Treating RN: Phillis Haggisinkerton, Debi [birth year] (50 y.o. Other Clinician: Date of Birth/Sex: Female) Treating ROBSON, MICHAEL Primary Care Physician: Fulton MoleBurns, Harriett Physician/Extender: G Referring Physician: Fulton MoleBurns, Harriett Weeks in Treatment: 0 Wound Status Wound Number: 4 Primary Open Surgical Wound Etiology: Wound Location: Left Lower Leg - Distal Wound Open Wounding Event: Surgical Injury Status: Date Acquired: 08/30/2015 Comorbid Asthma, Chronic Obstructive Weeks Of Treatment: 0 History: Pulmonary Disease (COPD), Clustered Wound: No Hypertension, Type II Diabetes Photos Photo Uploaded By: Alejandro MullingPinkerton, Debra on 09/13/2015 17:18:41 Wound Measurements Length: (cm) 1.9 Width:  (cm) 0.3 Depth: (cm) 0.1 Area: (cm) 0.448 Volume: (cm) 0.045 % Reduction in Area: % Reduction in Volume: Epithelialization: None Tunneling: No Undermining: No Wound Description Classification: Partial Thickness Foul Odor [name] Diabetic Severity (Wagner): Grade 1 Wound Margin: Flat and Intact Exudate Amount: Medium Exudate Type: Serous Exudate Color: amber fter Cleansing: No Wound Bed Granulation Amount: Large (67-100%) Exposed Structure [place], Natalya [name]. ([id number]) Granulation Quality: Pink Fascia Exposed: No Necrotic Amount: Small (1-33%) Fat Layer Exposed: No Necrotic Quality: Adherent Slough Tendon Exposed: No Muscle Exposed: No Joint Exposed: No Bone Exposed: No Limited to Skin Breakdown Periwound Skin Texture Texture Color No Abnormalities Noted: No No Abnormalities Noted: No Callus: No Atrophie Blanche: No Crepitus: No Cyanosis: No Excoriation: No Ecchymosis: No Fluctuance: No Erythema: No Friable: No Hemosiderin Staining: No Induration: No Mottled: No Localized Edema: Yes Pallor: No Rash: No Rubor: No Scarring: No Temperature / Pain Moisture Temperature: No Abnormality No Abnormalities Noted: No Tenderness on Palpation: Yes Dry / Scaly: No Maceration: No Moist: Yes Wound Preparation Ulcer Cleansing: Rinsed/Irrigated with Saline Topical Anesthetic Applied: None Treatment Notes Wound #4 (Left, Distal Lower Leg) 1. Cleansed with: Clean wound with Normal Saline 4. Dressing Applied: Xeroform 5. Secondary Dressing Applied ABD Pad Dry Gauze Kerlix/Conform 6. Footwear/Offloading device applied Ace wrap Electronic Signature(s) Signed: 09/13/2015 5:19:35 PM By: Alejandro MullingPinkerton, Debra Entered By: Alejandro MullingPinkerton, Debra on 09/13/2015 09:52:05 Lenzen, Paul HalfERESA [name]. ([id number]) Bowe, Paul HalfERESA [name]. ([id number]) -------------------------------------------------------------------------------- Vitals Details Patient Name: Brittany LowSCOGGINS, Brianda [name]. Date of Service:  09/13/2015 9:30 AM Medical Record Patient Account Number: [id number] Number: Treating RN: Phillis Haggisinkerton, Debi [birth year] (50 y.o. Other Clinician: Date of Birth/Sex: Female) Treating ROBSON, MICHAEL Primary Care Physician: Fulton MoleBurns, Harriett Physician/Extender: G Referring Physician: Fulton MoleBurns, Harriett Weeks in Treatment: 0 Vital Signs Time Taken: 09:30 Temperature (F): 97.9 Height (in): 62 Pulse (bpm): 75 Source: Stated Respiratory Rate (breaths/min): 20 Weight (lbs): 233 Blood Pressure (mmHg): 146/86 Source: Stated Reference Range: 80 - 120 mg / dl Body Mass Index (BMI): 42.6 Electronic Signature(s) Signed: 09/13/2015 5:19:35 PM By: Alejandro MullingPinkerton, Debra Entered By: Alejandro MullingPinkerton, Debra on 09/13/2015 09:31:22

## 2015-09-14 NOTE — Progress Notes (Signed)
VICTORINE, MCNEE ([id number]) Visit Report for 09/13/2015 Chief Complaint Document Details Patient Name: [name], [name] Date of Service: 09/13/2015 9:30 AM Medical Record Patient Account Number: [id number] Number: Treating RN: Phillis Haggis 05/26/1965 (50 y.o. Other Clinician: Date of Birth/Sex: Female) Treating Arul Farabee Primary Care Physician/Extender: Jodi Marble, Harriett Physician: Referring Physician: Fulton Mole Weeks in Treatment: 0 Information Obtained from: Patient Chief Complaint Patient arrives here acutely referred by her orthopedic surgeon Dr. Martha Clan for a nonhealing surgical wound on the left lateral leg 09/13/15; patient comes back to this clinic after having a myocutaneous flap surgery by plastic surgery [facility] roughly 2 weeks ago. Electronic Signature(s) Signed: 09/14/2015 8:03:58 AM By: Baltazar Najjar MD Entered By: Baltazar Najjar on 09/13/2015 10:36:16 Jenifer, [name] ([id number]) -------------------------------------------------------------------------------- HPI Details Patient Name: Brittany Low A. Date of Service: 09/13/2015 9:30 AM Medical Record Patient Account Number: [id number] Number: Treating RN: Phillis Haggis Mar 06, 1966 (50 y.o. Other Clinician: Date of Birth/Sex: Female) Treating Santiaga Butzin Primary Care Physician/Extender: Jodi Marble, Harriett Physician: Referring Physician: Fulton Mole Weeks in Treatment: 0 History of Present Illness HPI Description: 07/25/15; this is a patient who twisted her left leg earlier this month. She felt a popping sound and extreme pain. She was admitted to the hospital with a left lateral tibial plateau fracture and underwent an ORIF on March 5. She was in the hospital for 3 days. And then discharged home. She was on Lovenox 40 mg every 12 hours. She told me she is on insulin for her diabetes although I don't exactly see that on her drug list. I think the Lovenox is  for DVT prophylaxis and that really doesn't need to be every 12 hours I think we can cut that down to once a day. Unfortunately she had already left the clinic by the time I noticed this. In any case she apparently was doing fairly well. Her staples were taken out at her orthopedic surgeon's office on March 20. She went to see her orthopedic surgeon yesterday and unfortunately she had a dehisced since in the middle of the surgical wound with a clot. She also has 2 small areas inferior to this on the surgical line that are concerning. Augmentin and Septra were prescribed by Dr. Martha Clan. She hasn't yet started them but has the medications. She has no known history of diabetic neuropathy or PAD 08/02/15; the patient comes back in this week. Unfortunately the base of her wound is deteriorated. Last week I can only see a small screw however this week the base of the wound is dominated by plates and screws. There are 2 draining areas in the incision inferiorly. Culture from last week grew enterococcus cloacae I which should've been sensitive to the Augmentin and Septra. She has not been systemically unwell. 09/13/15; the patient returns to the clinic having a myocutaneous flap done at [facility]. This appears to look taken very well. She is on IV antibiotics through a PICC line in her left arm. They are applying Xeroform gauze, Kerlix and an Ace wrap over the wound. She has 2 small open areas in the middle of one of the flaps however these looks fairly benign. Most of her sutures appear to be holding on quite nicely there is still some swelling of the tissue although the even this doesn't look dramatic. There is no evidence of infection. She follows up with her surgeon some time next week. According the patient that that did not remove any of the hardware but "washed it out  quite well" Electronic Signature(s) Signed: 09/14/2015 8:03:58 AM By: Baltazar Najjar MD Entered By: Baltazar Najjar on 09/13/2015  10:45:45 [name] ([id number]) -------------------------------------------------------------------------------- Physical Exam Details Patient Name: Brittany Low A. Date of Service: 09/13/2015 9:30 AM Medical Record Patient Account Number: [id number] Number: Treating RN: Phillis Haggis February 18, 1966 (50 y.o. Other Clinician: Date of Birth/Sex: Female) Treating Emile Ringgenberg Primary Care Physician/Extender: Jodi Marble, Harriett Physician: Referring Physician: Fulton Mole Weeks in Treatment: 0 Constitutional Patient is hypertensive.[name] Respirations regular, non-labored and within target range.. Temperature is normal and within the target range for the patient.. Patient's appearance is neat and clean. Appears in no acute distress. Well nourished and well developed.. Eyes Conjunctivae clear. No discharge. No scleral icterus. Respiratory Respiratory effort is easy and symmetric bilaterally. Rate is normal at rest and on room air.. Cardiovascular Pedal pulses palpable and strong bilaterally.. Extremities are free of varicosities, clubbing or edema. Peripheral pulses strong and equal. Capillary refill < 3 seconds.. Lymphatic No lymphadenopathy in popliteal or inguinal areas. Notes Wound exam; the patient substantial surgical area on the lateral aspect of her left leg just below her knee actually looks remarkably good. She has some open areas in the middle of one of these flaps although the tissue looks healthy here and I didn't change any of the orders given to them by plastic surgery Electronic Signature(s) Signed: 09/14/2015 8:03:58 AM By: Baltazar Najjar MD Entered By: Baltazar Najjar on 09/13/2015 10:52:36 [name], [name] ([id number]) -------------------------------------------------------------------------------- Physician Orders Details Patient Name: Brittany Low A. Date of Service: 09/13/2015 9:30 AM Medical Record Patient Account Number:  [id number] Number: Treating RN: Phillis Haggis [birth year] (50 y.o. Other Clinician: Date of Birth/Sex: Female) Treating Dameon Soltis Primary Care Physician/Extender: Jodi Marble, Harriett Physician: Referring Physician: Fulton Mole Weeks in Treatment: 0 Verbal / Phone Orders: Yes Clinician: Pinkerton, Debi Read Back and Verified: Yes Diagnosis Coding Wound Cleansing Wound #3 Left,Proximal Lower Leg o Clean wound with Normal Saline. Wound #4 Left,Distal Lower Leg o Clean wound with Normal Saline. Primary Wound Dressing Wound #3 Left,Proximal Lower Leg o Xeroform Wound #4 Left,Distal Lower Leg o Xeroform Secondary Dressing Wound #3 Left,Proximal Lower Leg o Dry Gauze o Conform/Kerlix Wound #4 Left,Distal Lower Leg o Dry Gauze o Conform/Kerlix Follow-up Appointments Wound #3 Left,Proximal Lower Leg o Other: - in 3 weeks Wound #4 Left,Distal Lower Leg o Other: - in 3 weeks Edema Control Wound #3 Left,Proximal Lower Leg [name], [name] ([id number]) o Other: - ACE bandage that the pt came in with that she already had orders for with her plastic surgeon from [facility]. Wound #4 Left,Distal Lower Leg o Other: - ACE bandage that the pt came in with that she already had orders for with her plastic surgeon from [facility]. Electronic Signature(s) Signed: 09/13/2015 5:19:35 PM By: Alejandro Mulling Signed: 09/14/2015 8:03:58 AM By: Baltazar Najjar MD Entered By: Alejandro Mulling on 09/13/2015 10:12:28 [name] ([id number]) -------------------------------------------------------------------------------- Problem List Details Patient Name: Brittany Low A. Date of Service: 09/13/2015 9:30 AM Medical Record Patient Account Number: [id number] Number: Treating RN: Phillis Haggis 02/09/1966 (50 y.o. Other Clinician: Date of Birth/Sex: Female) Treating Alexah Kivett Primary Care Physician/Extender: Jodi Marble,  Harriett Physician: Referring Physician: Fulton Mole Weeks in Treatment: 0 Active Problems ICD-10 Encounter Code Description Active Date Diagnosis T81.31XD Disruption of external operation (surgical) wound, not 09/13/2015 Yes elsewhere classified, subsequent encounter E11.622 Type 2 diabetes mellitus with other skin ulcer 09/13/2015 Yes Inactive Problems Resolved Problems Electronic Signature(s) Signed: 09/14/2015 8:03:58 AM By: Leanord Hawking,  Casimiro Needle MD Entered By: Baltazar Najjar on 09/13/2015 10:32:23 [name] ([id number]) -------------------------------------------------------------------------------- Progress Note Details Patient Name: Brittany Low A. Date of Service: 09/13/2015 9:30 AM Medical Record Patient Account Number: [id number] Number: Treating RN: Phillis Haggis Jul 31, 1965 (50 y.o. Other Clinician: Date of Birth/Sex: Female) Treating Chelsae Zanella Primary Care Physician/Extender: Jodi Marble, Harriett Physician: Referring Physician: Fulton Mole Weeks in Treatment: 0 Subjective Chief Complaint Information obtained from Patient Patient arrives here acutely referred by her orthopedic surgeon Dr. Martha Clan for a nonhealing surgical wound on the left lateral leg 09/13/15; patient comes back to this clinic after having a myocutaneous flap surgery by plastic surgery [facility] roughly 2 weeks ago. History of Present Illness (HPI) 07/25/15; this is a patient who twisted her left leg earlier this month. She felt a popping sound and extreme pain. She was admitted to the hospital with a left lateral tibial plateau fracture and underwent an ORIF on March 5. She was in the hospital for 3 days. And then discharged home. She was on Lovenox 40 mg every 12 hours. She told me she is on insulin for her diabetes although I don't exactly see that on her drug list. I think the Lovenox is for DVT prophylaxis and that really doesn't need to be every 12 hours I think we  can cut that down to once a day. Unfortunately she had already left the clinic by the time I noticed this. In any case she apparently was doing fairly well. Her staples were taken out at her orthopedic surgeon's office on March 20. She went to see her orthopedic surgeon yesterday and unfortunately she had a dehisced since in the middle of the surgical wound with a clot. She also has 2 small areas inferior to this on the surgical line that are concerning. Augmentin and Septra were prescribed by Dr. Martha Clan. She hasn't yet started them but has the medications. She has no known history of diabetic neuropathy or PAD 08/02/15; the patient comes back in this week. Unfortunately the base of her wound is deteriorated. Last week I can only see a small screw however this week the base of the wound is dominated by plates and screws. There are 2 draining areas in the incision inferiorly. Culture from last week grew enterococcus cloacae I which should've been sensitive to the Augmentin and Septra. She has not been systemically unwell. 09/13/15; the patient returns to the clinic having a myocutaneous flap done at [facility]. This appears to look taken very well. She is on IV antibiotics through a PICC line in her left arm. They are applying Xeroform gauze, Kerlix and an Ace wrap over the wound. She has 2 small open areas in the middle of one of the flaps however these looks fairly benign. Most of her sutures appear to be holding on quite nicely there is still some swelling of the tissue although the even this doesn't look dramatic. There is no evidence of infection. She follows up with her surgeon some time next week. According the patient that that did not remove any of the hardware but "washed it out quite well" Wound History Patient presents with 1 open wound that has been present for approximately 09/06/15. Patient has been Easterwood, Chayah A. ([id number]) treating wound in the following manner: keeping clean.  Laboratory tests have not been performed in the last month. Patient reportedly has tested positive for an antibiotic resistant organism. Patient experiences the following problems associated with their wounds: swelling. Patient History Information obtained from Patient, .  Allergies No Known Allergies Family History Diabetes - Father, Mother, Siblings, Heart Disease - Father, Hypertension - Father, No family history of Cancer, Kidney Disease, Lung Disease, Seizures, Stroke, Thyroid Problems, Tuberculosis. Social History Current every day smoker, Marital Status - Married, Alcohol Use - Never, Drug Use - No History, Caffeine Use - Never. Medical History Hospitalization/Surgery History - 07/03/2015, leg fracture. Review of Systems (ROS) Constitutional Symptoms (General Health) The patient has no complaints or symptoms. Eyes The patient has no complaints or symptoms. Ear/Nose/Mouth/Throat The patient has no complaints or symptoms. Hematologic/Lymphatic The patient has no complaints or symptoms. Objective Constitutional Patient is hypertensive.[name] Respirations regular, non-labored and within target range.. Temperature is normal and within the target range for the patient.. Patient's appearance is neat and clean. Appears in no acute distress. Well nourished and well developed.. Vitals Time Taken: 9:30 AM, Height: 62 in, Source: Stated, Weight: 233 lbs, Source: Stated, BMI: 42.6, Temperature: 97.9 F, Pulse: 75 bpm, Respiratory Rate: 20 breaths/min, Blood Pressure: 146/86 mmHg. [name], [name] ([id number]) Eyes Conjunctivae clear. No discharge. No scleral icterus. Cardiovascular Pedal pulses palpable and strong bilaterally.. Extremities are free of varicosities, clubbing or edema. Peripheral pulses strong and equal. Capillary refill < 3 seconds.. General Notes: Wound exam; the patient substantial surgical area on the lateral aspect of her left leg just below her knee actually looks  remarkably good. She has some open areas in the middle of one of these flaps although the tissue looks healthy here and I didn't change any of the orders given to them by plastic surgery Integumentary (Hair, Skin) Wound #3 status is Open. Original cause of wound was Surgical Injury. The wound is located on the Left,Proximal Lower Leg. The wound measures 5.5cm length x 2cm width x 0.1cm depth; 8.639cm^2 area and 0.864cm^3 volume. The wound is limited to skin breakdown. There is a medium amount of serous drainage noted. The wound margin is flat and intact. There is large (67-100%) pink granulation within the wound bed. There is a small (1-33%) amount of necrotic tissue within the wound bed including Adherent Slough. The periwound skin appearance exhibited: Localized Edema, Moist. The periwound skin appearance did not exhibit: Callus, Crepitus, Excoriation, Fluctuance, Friable, Induration, Rash, Scarring, Dry/Scaly, Maceration, Atrophie Blanche, Cyanosis, Ecchymosis, Hemosiderin Staining, Mottled, Pallor, Rubor, Erythema. Wound #4 status is Open. Original cause of wound was Surgical Injury. The wound is located on the Left,Distal Lower Leg. The wound measures 1.9cm length x 0.3cm width x 0.1cm depth; 0.448cm^2 area and 0.045cm^3 volume. The wound is limited to skin breakdown. There is no tunneling or undermining noted. There is a medium amount of serous drainage noted. The wound margin is flat and intact. There is large (67-100%) pink granulation within the wound bed. There is a small (1-33%) amount of necrotic tissue within the wound bed including Adherent Slough. The periwound skin appearance exhibited: Localized Edema, Moist. The periwound skin appearance did not exhibit: Callus, Crepitus, Excoriation, Fluctuance, Friable, Induration, Rash, Scarring, Dry/Scaly, Maceration, Atrophie Blanche, Cyanosis, Ecchymosis, Hemosiderin Staining, Mottled, Pallor, Rubor, Erythema. Periwound temperature was  noted as No Abnormality. The periwound has tenderness on palpation. Assessment Active Problems ICD-10 T81.31XD - Disruption of external operation (surgical) wound, not elsewhere classified, subsequent encounter E11.622 - Type 2 diabetes mellitus with other skin ulcer [name], [name] ([id number]) Plan Wound Cleansing: Wound #3 Left,Proximal Lower Leg: Clean wound with Normal Saline. Wound #4 Left,Distal Lower Leg: Clean wound with Normal Saline. Primary Wound Dressing: Wound #3 Left,Proximal Lower Leg: Xeroform Wound #4 Left,Distal  Lower Leg: Xeroform Secondary Dressing: Wound #3 Left,Proximal Lower Leg: Dry Gauze Conform/Kerlix Wound #4 Left,Distal Lower Leg: Dry Gauze Conform/Kerlix Follow-up Appointments: Wound #3 Left,Proximal Lower Leg: Other: - in 3 weeks Wound #4 Left,Distal Lower Leg: Other: - in 3 weeks Edema Control: Wound #3 Left,Proximal Lower Leg: Other: - ACE bandage that the pt came in with that she [facility] Wound #4 Left,Distal Lower Leg: Other: - ACE bandage that the pt came in with that she already had [facility] #1 I did not change any of the current surgical orders here. Xeroform, Kerlix, Ace wrap. She does have 2 small open areas in the middle of one of the flaps which may need better dressings at some point. Patient asked me to consider removing some of her staples but I do not think that's the right thing to do at this point. All of her surgical areas look like they were holding together quite well. There is no evidence of ongoing infection line #2 I gave her an appointment for 3 weeks. As mentioned she follows up with her surgeon in 2 weeks time. She tells me that she was out of her pain medications and that they had to give her something "stronger than oxycodone" to control her pain when she left [facility]. I am not going to prescribe him eating be on hydrocodone in  this clinic. I gave her a prescription for tramadol Brittany MussSCOGGINS, [name] ([id number]) Electronic Signature(s) Signed: 09/14/2015 8:03:58 AM [name], Devario Bucklew MD Entered [name], Corrine Tillis on 09/13/2015 10:50:36 [name], Brittany HalfERESA A. ([id number]) -------------------------------------------------------------------------------- ROS/PFSH Details Patient Name: Brittany LowSCOGGINS, Kery A. Date of Service: 09/13/2015 9:30 AM Medical Record Patient Account Number: [id number] Number: Treating RN: Phillis Haggisinkerton, Debi [birth year] (50 y.o. Other Clinician: Date of Birth/Sex: Female) Treating Casimir Barcellos Primary Care Physician/Extender: Jodi MarbleG Burns, Harriett Physician: Referring Physician: Fulton MoleBurns, Harriett Weeks in Treatment: 0 Information Obtained From Patient Other: Wound History Do you currently have one or more open woundso Yes How many open wounds do you currently haveo 1 Approximately how long have you had your woundso 09/06/15 How have you been treating your wound(s) until nowo keeping clean Has your wound(s) ever healed and then re-openedo No Have you had any lab work done in the past montho No Have you had other problems associated with your woundso Swelling Constitutional Symptoms (General Health) Complaints and Symptoms: No Complaints or Symptoms Eyes Complaints and Symptoms: No Complaints or Symptoms Ear/Nose/Mouth/Throat Complaints and Symptoms: No Complaints or Symptoms Hematologic/Lymphatic Complaints and Symptoms: No Complaints or Symptoms Respiratory Medical History: Positive for: Asthma; Chronic Obstructive Pulmonary Disease (COPD) Cardiovascular Asbridge, Palmer A. ([id number]) Medical History: Positive for: Hypertension Endocrine Medical History: Positive for: Type II Diabetes Time with diabetes: 18 years Treated with: Insulin Blood sugar tested every day: No Hospitalization / Surgery History Name of Hospital Purpose of Hospitalization/Surgery Date leg fracture  07/03/2015 Family and Social History Cancer: No; Diabetes: Yes - Father, Mother, Siblings; Heart Disease: Yes - Father; Hypertension: Yes - Father; Kidney Disease: No; Lung Disease: No; Seizures: No; Stroke: No; Thyroid Problems: No; Tuberculosis: No; Current every day smoker; Marital Status - Married; Alcohol Use: Never; Drug Use: No History; Caffeine Use: Never; Financial Concerns: No; Food, Clothing or Shelter Needs: No; Support System Lacking: No; Transportation Concerns: No; Advanced Directives: No; Patient does not want information on Garment/textile technologistAdvanced Directives Electronic Signature(s) Signed: 09/13/2015 5:19:35 PM By: Alejandro MullingPinkerton, Debra Signed: 09/14/2015 8:03:58 AM By: Leanord Hawkingobson,  Casimiro Needle MD Entered By: Alejandro Mulling on 09/13/2015 09:39:33 [name], [name] ([id number]) -------------------------------------------------------------------------------- SuperBill Details Patient Name: Brittany Low A. Date of Service: 09/13/2015 Medical Record Patient Account Number: [id number] Number: Treating RN: Phillis Haggis 1965-07-12 (50 y.o. Other Clinician: Date of Birth/Sex: Female) Treating Xan Sparkman Primary Care Physician/Extender: Jodi Marble, Harriett Physician: Tania Ade in Treatment: 0 Referring Physician: Fulton Mole Diagnosis Coding ICD-10 Codes Code Description Disruption of external operation (surgical) wound, not elsewhere classified, subsequent T81.31XD encounter E11.622 Type 2 diabetes mellitus with other skin ulcer Facility Procedures CPT4 Code: [id number] Description: 99214 - WOUND CARE VISIT-LEV 4 EST PT Modifier: Quantity: 1 Physician Procedures CPT4: Description Modifier Quantity Code [id number] 99213 - WC PHYS LEVEL 3 - EST PT 1 ICD-10 Description Diagnosis T81.31XD Disruption of external operation (surgical) wound, not elsewhere classified, subsequent encounter Electronic Signature(s) Signed: 09/14/2015 8:03:58 AM By: Baltazar Najjar MD Entered By: Baltazar Najjar on 09/13/2015 10:51:29

## 2015-09-20 ENCOUNTER — Encounter: Payer: Self-pay | Admitting: Orthopedic Surgery

## 2015-10-04 ENCOUNTER — Ambulatory Visit: Payer: Medicaid Other | Admitting: Internal Medicine

## 2015-10-11 ENCOUNTER — Encounter: Payer: Medicaid Other | Attending: Internal Medicine | Admitting: Internal Medicine

## 2015-10-11 DIAGNOSIS — E11622 Type 2 diabetes mellitus with other skin ulcer: Secondary | ICD-10-CM | POA: Diagnosis present

## 2015-10-11 DIAGNOSIS — T8131XD Disruption of external operation (surgical) wound, not elsewhere classified, subsequent encounter: Secondary | ICD-10-CM | POA: Diagnosis not present

## 2015-10-11 DIAGNOSIS — Z794 Long term (current) use of insulin: Secondary | ICD-10-CM | POA: Insufficient documentation

## 2015-10-11 DIAGNOSIS — Y839 Surgical procedure, unspecified as the cause of abnormal reaction of the patient, or of later complication, without mention of misadventure at the time of the procedure: Secondary | ICD-10-CM | POA: Diagnosis not present

## 2015-11-02 NOTE — Progress Notes (Signed)
[name] ([id number]) Visit Report for 10/11/2015 Arrival Information Details Patient Name: [name], WOJTAS. Date of Service: 10/11/2015 2:15 PM Medical Record Patient Account Number: [id number] Number: Treating RN: Clover Mealy, RN, BSN, Rita [birth year] (50 y.o. Other Clinician: Date of Birth/Sex: Female) Treating ROBSON, MICHAEL Primary Care Physician/Extender: Jodi Marble, Harriett Physician: Referring Physician: Fulton Mole Weeks in Treatment: 4 Visit Information History Since Last Visit Added or deleted any medications: No Patient Arrived: Walker Any new allergies or adverse reactions: No Arrival Time: 14:35 Had a fall or experienced change in No Accompanied By: self activities of daily living that may affect Transfer Assistance: None risk of falls: Patient Identification Verified: Yes Signs or symptoms of abuse/neglect since last No Secondary Verification Process Yes visito Completed: Hospitalized since last visit: No Patient Requires Transmission-Based No Has Dressing in Place as Prescribed: Yes Precautions: Pain Present Now: No Patient Has Alerts: Yes Patient Alerts: DM II Electronic Signature(s) Signed: 10/11/2015 5:17:42 PM By: Elpidio Eric BSN, RN Entered By: Elpidio Eric on 10/11/2015 14:36:54 [name], [name] ([id number]) -------------------------------------------------------------------------------- Clinic Level of Care Assessment Details Patient Name: [name] A. Date of Service: 10/11/2015 2:15 PM Medical Record Patient Account Number: [id number] Number: Treating RN: Clover Mealy, RN, BSN, Rita 1965-08-21 (50 y.o. Other Clinician: Date of Birth/Sex: Female) Treating ROBSON, MICHAEL Primary Care Physician/Extender: Jodi Marble, Harriett Physician: Referring Physician: Fulton Mole Weeks in Treatment: 4 Clinic Level of Care Assessment Items TOOL 4 Quantity Score []  - Use when only an EandM is performed on FOLLOW-UP visit  0 ASSESSMENTS - Nursing Assessment / Reassessment X - Reassessment of Co-morbidities (includes updates in patient status) 1 10 X - Reassessment of Adherence to Treatment Plan 1 5 ASSESSMENTS - Wound and Skin Assessment / Reassessment []  - Simple Wound Assessment / Reassessment - one wound 0 []  - Complex Wound Assessment / Reassessment - multiple wounds 0 []  - Dermatologic / Skin Assessment (not related to wound area) 0 ASSESSMENTS - Focused Assessment []  - Circumferential Edema Measurements - multi extremities 0 []  - Nutritional Assessment / Counseling / Intervention 0 X - Lower Extremity Assessment (monofilament, tuning fork, pulses) 1 5 []  - Peripheral Arterial Disease Assessment (using hand held doppler) 0 ASSESSMENTS - Ostomy and/or Continence Assessment and Care []  - Incontinence Assessment and Management 0 []  - Ostomy Care Assessment and Management (repouching, etc.) 0 PROCESS - Coordination of Care X - Simple Patient / Family Education for ongoing care 1 15 []  - Complex (extensive) Patient / Family Education for ongoing care 0 []  - Staff obtains Consents, Records, Test Results / Process Orders 0 [name], [name] ([id number]) []  - Staff telephones HHA, Nursing Homes / Clarify orders / etc 0 []  - Routine Transfer to another Facility (non-emergent condition) 0 []  - Routine Hospital Admission (non-emergent condition) 0 []  - New Admissions / Manufacturing engineer / Ordering NPWT, Apligraf, etc. 0 []  - Emergency Hospital Admission (emergent condition) 0 X - Simple Discharge Coordination 1 10 []  - Complex (extensive) Discharge Coordination 0 PROCESS - Special Needs []  - Pediatric / Minor Patient Management 0 []  - Isolation Patient Management 0 []  - Hearing / Language / Visual special needs 0 []  - Assessment of Community assistance (transportation, D/C planning, etc.) 0 []  - Additional assistance / Altered mentation 0 []  - Support Surface(s) Assessment (bed, cushion, seat, etc.)  0 INTERVENTIONS - Wound Cleansing / Measurement []  - Simple Wound Cleansing - one wound 0 []  - Complex Wound Cleansing - multiple wounds 0 X - Wound Imaging (  photographs - any number of wounds) 1 5  - Wound Tracing (instead of photographs) 0  - Simple Wound Measurement - one wound 0  - Complex Wound Measurement - multiple wounds 0 INTERVENTIONS - Wound Dressings  - Small Wound Dressing one or multiple wounds 0  - Medium Wound Dressing one or multiple wounds 0  - Large Wound Dressing one or multiple wounds 0  - Application of Medications - topical 0  - Application of Medications - injection 0 [name], [name] ([id number]) INTERVENTIONS - Miscellaneous  - External ear exam 0  - Specimen Collection (cultures, biopsies, blood, body fluids, etc.) 0  - Specimen(s) / Culture(s) sent or taken to Lab for analysis 0  - Patient Transfer (multiple staff / Michiel Sites Lift / Similar devices) 0  - Simple Staple / Suture removal (25 or less) 0  - Complex Staple / Suture removal (26 or more) 0  - Hypo / Hyperglycemic Management (close monitor of Blood Glucose) 0  - Ankle / Brachial Index (ABI) - do not check if billed separately 0 X - Vital Signs 1 5 Has the patient been seen at the hospital within the last three years: Yes Total Score: 55 Level Of Care: New/Established - Level 2 Electronic Signature(s) Signed: 10/11/2015 5:07:27 PM By: Elpidio Eric BSN, RN Entered By: Elpidio Eric on 10/11/2015 17:07:27 [name] ([id number]) -------------------------------------------------------------------------------- Encounter Discharge Information Details Patient Name: [name] A. Date of Service: 10/11/2015 2:15 PM Medical Record Patient Account Number: [id number] Number: Treating RN: Clover Mealy, RN, BSN, Rita 22-May-1965 (50 y.o. Other Clinician: Date of Birth/Sex: Female) Treating ROBSON, MICHAEL Primary Care Physician/Extender: Jodi Marble,  Harriett Physician: Referring Physician: Fulton Mole Weeks in Treatment: 4 Encounter Discharge Information Items Discharge Pain Level: 0 Discharge Condition: Stable Ambulatory Status: Walker Discharge Destination: Home Transportation: Private Auto Accompanied By: husband Schedule Follow-up Appointment: No Medication Reconciliation completed and provided to Patient/Care No Austan Nicholl: Provided on Clinical Summary of Care: 10/11/2015 Form Type Recipient Paper Patient TS Electronic Signature(s) Signed: 10/11/2015 5:08:11 PM By: Elpidio Eric BSN, RN Previous Signature: 10/11/2015 3:11:16 PM Version By: Gwenlyn Perking Entered By: Elpidio Eric on 10/11/2015 17:08:11 Scoggin, [name] ([id number]) -------------------------------------------------------------------------------- Lower Extremity Assessment Details Patient Name: [name] A. Date of Service: 10/11/2015 2:15 PM Medical Record Patient Account Number: [id number] Number: Treating RN: Clover Mealy, RN, BSN, Rita 03-14-1966 (50 y.o. Other Clinician: Date of Birth/Sex: Female) Treating ROBSON, MICHAEL Primary Care Physician/Extender: Jodi Marble, Harriett Physician: Referring Physician: Fulton Mole Weeks in Treatment: 4 Vascular Assessment Pulses: Posterior Tibial Dorsalis Pedis Palpable: [Left:Yes] Extremity colors, hair growth, and conditions: Extremity Color: [Left:Dusky] Hair Growth on Extremity: [Left:No] Temperature of Extremity: [Left:Warm] Capillary Refill: [Left:< 3 seconds] Toe Nail Assessment Left: Right: Thick: Yes Discolored: No Deformed: No Improper Length and Hygiene: No Electronic Signature(s) Signed: 10/11/2015 5:17:42 PM By: Elpidio Eric BSN, RN Entered By: Elpidio Eric on 10/11/2015 14:37:58 Paulick, [name] ([id number]) -------------------------------------------------------------------------------- Multi-Disciplinary Care Plan Details Patient Name: [name] A. Date of  Service: 10/11/2015 2:15 PM Medical Record Patient Account Number: [id number] Number: Treating RN: Clover Mealy, RN, BSN, Rita May 15, 1965 (50 y.o. Other Clinician: Date of Birth/Sex: Female) Treating ROBSON, MICHAEL Primary Care Physician/Extender: Jodi Marble, Harriett Physician: Referring Physician: Fulton Mole Weeks in Treatment: 4 Active Inactive Electronic Signature(s) Signed: 10/11/2015 5:06:43 PM By: Elpidio Eric BSN, RN Entered By: Elpidio Eric on 10/11/2015 17:06:43 Fines, [name] ([id number]) -------------------------------------------------------------------------------- Pain Assessment Details Patient Name: [name] A. Date of Service: 10/11/2015 2:15 PM Medical Record Patient  Account Number: [id number] Number: Treating RN: Clover MealyAfful, RN, BSN, [place]horn Sinkita 1966/02/14 (50 y.o. Other Clinician: Date of Birth/Sex: Female) Treating ROBSON, MICHAEL Primary Care Physician/Extender: Jodi MarbleG Burns, Harriett Physician: Referring Physician: Fulton MoleBurns, Harriett Weeks in Treatment: 4 Active Problems Location of Pain Severity and Description of Pain Patient Has Paino No Site Locations With Dressing Change: No Pain Management and Medication Current Pain Management: Electronic Signature(s) Signed: 10/11/2015 5:17:42 PM By: Elpidio EricAfful, Rita BSN, RN Entered By: Elpidio EricAfful, Rita on 10/11/2015 14:37:03 Eilers, Brittany HalfERESA A. ([id number]) -------------------------------------------------------------------------------- Patient/Caregiver Education Details Patient Name: Brittany LowSCOGGINS, [name] Date of Service: 10/11/2015 2:15 PM Medical Record Patient Account Number: [id number] Number: Treating RN: Clover MealyAfful, RN, BSN, Rita 1966/02/14 (50 y.o. Other Clinician: Date of Birth/Gender: Female) Treating ROBSON, MICHAEL Primary Care Physician/Extender: Jodi MarbleG Burns, Harriett Physician: Tania AdeWeeks in Treatment: 4 Referring Physician: Fulton MoleBurns, Harriett Education Assessment Education Provided  To: Patient Education Topics Provided Basic Hygiene: Methods: Explain/Verbal Responses: State content correctly Electronic Signature(s) Signed: 10/11/2015 5:08:23 PM By: Elpidio EricAfful, Rita BSN, RN Entered By: Elpidio EricAfful, Rita on 10/11/2015 17:08:23 Brittany MussSCOGGINS, [name] ([id number]) -------------------------------------------------------------------------------- Wound Assessment Details Patient Name: Brittany LowSCOGGINS, [name] Date of Service: 10/11/2015 2:15 PM Medical Record Patient Account Number: [id number] Number: Treating RN: Clover MealyAfful, RN, BSN, Rita 1966/02/14 (50 y.o. Other Clinician: Date of Birth/Sex: Female) Treating ROBSON, MICHAEL Primary Care Physician/Extender: Jodi MarbleG Burns, Harriett Physician: Referring Physician: Fulton MoleBurns, Harriett Weeks in Treatment: 4 Wound Status Wound Number: 3 Primary Open Surgical Wound Etiology: Wound Location: Left Lower Leg - Proximal Wound Open Wounding Event: Surgical Injury Status: Date Acquired: 08/30/2015 Comorbid Asthma, Chronic Obstructive Weeks Of Treatment: 4 History: Pulmonary Disease (COPD), Clustered Wound: No Hypertension, Type II Diabetes Photos Photo Uploaded By: Elpidio EricAfful, Rita on 10/11/2015 17:15:21 Wound Measurements Length: (cm) 0 % Reduction in Width: (cm) 0 % Reduction in Depth: (cm) 0 Epithelializat Area: (cm) 0 Tunneling: Volume: (cm) 0 Undermining: Area: 100% Volume: 100% ion: Large (67-100%) No No Wound Description Classification: Partial Thickness Dory, [name] ([id number]) Foul Odor After Cleansing: No Diabetic Severity [address](Wagner): Grade 1 Wound Margin: Flat and Intact Exudate Amount: None Present Wound Bed Granulation Amount: None Present (0%) Exposed Structure Necrotic Amount: None Present (0%) Fascia Exposed: No Fat Layer Exposed: No Tendon Exposed: No Muscle Exposed: No Joint Exposed: No Bone Exposed: No Limited to Skin Breakdown Periwound Skin Texture Texture Color No Abnormalities Noted: No No  Abnormalities Noted: No Callus: No Atrophie Blanche: No Crepitus: No Cyanosis: No Excoriation: No Ecchymosis: No Fluctuance: No Erythema: No Friable: No Hemosiderin Staining: No Induration: No Mottled: No Localized Edema: Yes Pallor: No Rash: No Rubor: No Scarring: No Temperature / Pain Moisture Temperature: No Abnormality No Abnormalities Noted: No Dry / Scaly: Yes Maceration: No Moist: No Wound Preparation Ulcer Cleansing: Rinsed/Irrigated with Saline Topical Anesthetic Applied: None Electronic Signature(s) Signed: 10/11/2015 5:17:42 PM By: Elpidio EricAfful, Rita BSN, RN Entered By: Elpidio EricAfful, Rita on 10/11/2015 14:40:13 Tuohey, Brittany AMarland Kitchen. ([id number]) -------------------------------------------------------------------------------- Wound Assessment Details Patient Name: Brittany LowSCOGGINS, [name] Date of Service: 10/11/2015 2:15 PM Medical Record Patient Account Number: [id number] Number: Treating RN: Clover MealyAfful, RN, BSN, Rita 1966/02/14 (50 y.o. Other Clinician: Date of Birth/Sex: Female) Treating ROBSON, MICHAEL Primary Care Physician/Extender: Jodi MarbleG Burns, Harriett Physician: Referring Physician: Fulton MoleBurns, Harriett Weeks in Treatment: 4 Wound Status Wound Number: 4 Primary Open Surgical Wound Etiology: Wound Location: Left Lower Leg - Distal Wound Open Wounding Event: Surgical Injury Status: Date Acquired: 08/30/2015 Comorbid Asthma, Chronic Obstructive Weeks Of Treatment: 4 History: Pulmonary Disease (COPD), Clustered Wound: No Hypertension, Type II Diabetes Photos  Photo Uploaded By: Elpidio EricAfful, Rita on 10/11/2015 17:15:22 Wound Measurements Length: (cm) 0 % Reduction i Width: (cm) 0 % Reduction i Depth: (cm) 0 Epithelializa Area: (cm) 0 Tunneling: Volume: (cm) 0 Undermining: n Area: 100% n Volume: 100% tion: Large (67-100%) No No Wound Description Classification: Partial Thickness Foul Odor Af Pulver, [name] ([id number]) ter Cleansing: No Diabetic Severity  [address](Wagner): Grade 1 Wound Margin: Flat and Intact Exudate Amount: None Present Wound Bed Granulation Amount: None Present (0%) Exposed Structure Necrotic Amount: None Present (0%) Fascia Exposed: No Fat Layer Exposed: No Tendon Exposed: No Muscle Exposed: No Joint Exposed: No Bone Exposed: No Limited to Skin Breakdown Periwound Skin Texture Texture Color No Abnormalities Noted: No No Abnormalities Noted: No Callus: No Atrophie Blanche: No Crepitus: No Cyanosis: No Excoriation: No Ecchymosis: No Fluctuance: No Erythema: No Friable: No Hemosiderin Staining: No Induration: No Mottled: No Localized Edema: Yes Pallor: No Rash: No Rubor: No Scarring: No Temperature / Pain Moisture Temperature: No Abnormality No Abnormalities Noted: No Tenderness on Palpation: Yes Dry / Scaly: Yes Maceration: No Moist: No Wound Preparation Ulcer Cleansing: Rinsed/Irrigated with Saline Topical Anesthetic Applied: None Electronic Signature(s) Signed: 10/11/2015 5:17:42 PM By: Elpidio EricAfful, Rita BSN, RN Entered By: Elpidio EricAfful, Rita on 10/11/2015 14:40:40 Rothe, Brittany HalfERESA A. ([id number]) -------------------------------------------------------------------------------- Vitals Details Patient Name: Brittany LowSCOGGINS, [name] Date of Service: 10/11/2015 2:15 PM Medical Record Patient Account Number: [id number] Number: Treating RN: Clover MealyAfful, RN, BSN, Rita 03-18-1966 (50 y.o. Other Clinician: Date of Birth/Sex: Female) Treating ROBSON, MICHAEL Primary Care Physician/Extender: Jodi MarbleG Burns, Harriett Physician: Referring Physician: Fulton MoleBurns, Harriett Weeks in Treatment: 4 Vital Signs Time Taken: 14:40 Temperature (F): 97.9 Height (in): 62 Pulse (bpm): 76 Weight (lbs): 233 Respiratory Rate (breaths/min): 17 Body Mass Index (BMI): 42.6 Blood Pressure (mmHg): 122/74 Reference Range: 80 - 120 mg / dl Electronic Signature(s) Signed: 10/11/2015 5:17:42 PM By: Elpidio EricAfful, Rita BSN, RN Entered By: Elpidio EricAfful, Rita on  10/11/2015 14:43:02

## 2015-11-02 NOTE — Progress Notes (Signed)
[name], [name] ([id number]) Visit Report for 10/11/2015 Chief Complaint Document Details Patient Name: [name], [name] Date of Service: 10/11/2015 2:15 PM Medical Record Patient Account Number: [id number] Number: Treating RN: Clover Mealy, RN, BSN, Rita [birth year] (50 y.o. Other Clinician: Date of Birth/Sex: Female) Treating ROBSON, MICHAEL Primary Care Physician/Extender: Jodi Marble, Harriett Physician: Referring Physician: Fulton Mole Weeks in Treatment: 4 Information Obtained from: Patient Chief Complaint Patient arrives here acutely referred by her orthopedic surgeon Dr. Martha Clan for a nonhealing surgical wound on the left lateral leg 09/13/15; patient comes back to this clinic after having a myocutaneous flap surgery by plastic surgery [facility] roughly 2 weeks ago. Electronic Signature(s) Signed: 11/02/2015 7:33:08 AM By: Baltazar Najjar MD Entered By: Baltazar Najjar on 10/11/2015 15:42:26 Kihara, [name] ([id number]) -------------------------------------------------------------------------------- HPI Details Patient Name: Brittany Low A. Date of Service: 10/11/2015 2:15 PM Medical Record Patient Account Number: [id number] Number: Treating RN: Clover Mealy, RN, BSN, Rita [birth year] (50 y.o. Other Clinician: Date of Birth/Sex: Female) Treating ROBSON, MICHAEL Primary Care Physician/Extender: Jodi Marble, Harriett Physician: Referring Physician: Fulton Mole Weeks in Treatment: 4 History of Present Illness HPI Description: 07/25/15; this is a patient who twisted her left leg earlier this month. She felt a popping sound and extreme pain. She was admitted to the hospital with a left lateral tibial plateau fracture and underwent an ORIF on March 5. She was in the hospital for 3 days. And then discharged home. She was on Lovenox 40 mg every 12 hours. She told me she is on insulin for her diabetes although I don't exactly see that on her drug list. I think the  Lovenox is for DVT prophylaxis and that really doesn't need to be every 12 hours I think we can cut that down to once a day. Unfortunately she had already left the clinic by the time I noticed this. In any case she apparently was doing fairly well. Her staples were taken out at her orthopedic surgeon's office on March 20. She went to see her orthopedic surgeon yesterday and unfortunately she had a dehisced since in the middle of the surgical wound with a clot. She also has 2 small areas inferior to this on the surgical line that are concerning. Augmentin and Septra were prescribed by Dr. Martha Clan. She hasn't yet started them but has the medications. She has no known history of diabetic neuropathy or PAD 08/02/15; the patient comes back in this week. Unfortunately the base of her wound is deteriorated. Last week I can only see a small screw however this week the base of the wound is dominated by plates and screws. There are 2 draining areas in the incision inferiorly. Culture from last week grew enterococcus cloacae I which should've been sensitive to the Augmentin and Septra. She has not been systemically unwell. 09/13/15; the patient returns to the clinic having a myocutaneous flap done at [facility]. This appears to look taken very well. She is on IV antibiotics through a PICC line in her left arm. They are applying Xeroform gauze, Kerlix and an Ace wrap over the wound. She has 2 small open areas in the middle of one of the flaps however these looks fairly benign. Most of her sutures appear to be holding on quite nicely there is still some swelling of the tissue although the even this doesn't look dramatic. There is no evidence of infection. She follows up with her surgeon some time next week. According the patient that that did not remove any of the hardware  but "washed it out quite well" 10/11/15; the patient comes back in follow-up of the 2 small wounds over her flap on the lateral left leg roughly  over the head of her fibula. All the wounds are closed here. The patient is ambulating on her own with a walker. She asked for copies of our photos of the wounds which we will attempt to provide Electronic Signature(s) Signed: 11/02/2015 7:33:08 AM By: Baltazar Najjar MD Entered By: Baltazar Najjar on 10/11/2015 15:43:34 [name], [name] ([id number]) -------------------------------------------------------------------------------- Physical Exam Details Patient Name: Brittany Low A. Date of Service: 10/11/2015 2:15 PM Medical Record Patient Account Number: [id number] Number: Treating RN: Clover Mealy, RN, BSN, Rita 1965/06/13 (50 y.o. Other Clinician: Date of Birth/Sex: Female) Treating ROBSON, MICHAEL Primary Care Physician/Extender: Jodi Marble, Harriett Physician: Referring Physician: Fulton Mole Weeks in Treatment: 4 Constitutional Sitting or standing Blood Pressure is within target range for patient.. Pulse regular and within target range for patient.[name] Respirations regular, non-labored and within target range.. Temperature is normal and within the target range for the patient.. Patient's appearance is neat and clean. Appears in no acute distress. Well nourished and well developed.. Cardiovascular Pedal pulses palpable and strong bilaterally.. Extremities are free of varicosities, clubbing or edema. Peripheral pulses strong and equal. Capillary refill < 3 seconds.. Notes Wound exam; substantial surgical area on the medial aspect of her left leg just below her knee. There are no open areas here minimal swelling and no tenderness. Actually really remarkable outcome for this patient given how she first appeared to this clinic. Electronic Signature(s) Signed: 11/02/2015 7:33:08 AM By: Baltazar Najjar MD Entered By: Baltazar Najjar on 10/11/2015 15:44:45 [name], [name] ([id number]) -------------------------------------------------------------------------------- Physician  Orders Details Patient Name: Brittany Low A. Date of Service: 10/11/2015 2:15 PM Medical Record Patient Account Number: [id number] Number: Treating RN: Clover Mealy, RN, BSN, Rita March 16, 1966 (50 y.o. Other Clinician: Date of Birth/Sex: Female) Treating ROBSON, MICHAEL Primary Care Physician/Extender: Jodi Marble, Harriett Physician: Referring Physician: Glade Nurse in Treatment: 4 Verbal / Phone Orders: Yes Clinician: Afful, RN, BSN, Rita Read Back and Verified: Yes Diagnosis Coding Discharge From [facility] o Discharge from Wound Care Center - Treatment Completed Electronic Signature(s) Signed: 10/11/2015 5:17:42 PM By: Elpidio Eric BSN, RN Signed: 11/02/2015 7:33:08 AM By: Baltazar Najjar MD Entered By: Elpidio Eric on 10/11/2015 15:10:08 [name] ([id number]) -------------------------------------------------------------------------------- Problem List Details Patient Name: Brittany Low A. Date of Service: 10/11/2015 2:15 PM Medical Record Patient Account Number: [id number] Number: Treating RN: Clover Mealy, RN, BSN, Rita 1966-04-23 (50 y.o. Other Clinician: Date of Birth/Sex: Female) Treating ROBSON, MICHAEL Primary Care Physician/Extender: Jodi Marble, Harriett Physician: Referring Physician: Fulton Mole Weeks in Treatment: 4 Active Problems ICD-10 Encounter Code Description Active Date Diagnosis T81.31XD Disruption of external operation (surgical) wound, not 09/13/2015 Yes elsewhere classified, subsequent encounter E11.622 Type 2 diabetes mellitus with other skin ulcer 09/13/2015 Yes Inactive Problems Resolved Problems Electronic Signature(s) Signed: 11/02/2015 7:33:08 AM By: Baltazar Najjar MD Entered By: Baltazar Najjar on 10/11/2015 15:42:10 [name], [name] ([id number]) -------------------------------------------------------------------------------- Progress Note Details Patient Name: Brittany Low A. Date of Service:  10/11/2015 2:15 PM Medical Record Patient Account Number: [id number] Number: Treating RN: Clover Mealy, RN, BSN, Rita 18-Apr-1966 (50 y.o. Other Clinician: Date of Birth/Sex: Female) Treating ROBSON, MICHAEL Primary Care Physician/Extender: Jodi Marble, Harriett Physician: Referring Physician: Fulton Mole Weeks in Treatment: 4 Subjective Chief Complaint Information obtained from Patient Patient arrives here acutely referred by her orthopedic surgeon Dr. Martha Clan for a nonhealing surgical wound on  the left lateral leg 09/13/15; patient comes back to this clinic after having a myocutaneous flap surgery by plastic surgery [facility] roughly 2 weeks ago. History of Present Illness (HPI) 07/25/15; this is a patient who twisted her left leg earlier this month. She felt a popping sound and extreme pain. She was admitted to the hospital with a left lateral tibial plateau fracture and underwent an ORIF on March 5. She was in the hospital for 3 days. And then discharged home. She was on Lovenox 40 mg every 12 hours. She told me she is on insulin for her diabetes although I don't exactly see that on her drug list. I think the Lovenox is for DVT prophylaxis and that really doesn't need to be every 12 hours I think we can cut that down to once a day. Unfortunately she had already left the clinic by the time I noticed this. In any case she apparently was doing fairly well. Her staples were taken out at her orthopedic surgeon's office on March 20. She went to see her orthopedic surgeon yesterday and unfortunately she had a dehisced since in the middle of the surgical wound with a clot. She also has 2 small areas inferior to this on the surgical line that are concerning. Augmentin and Septra were prescribed by Dr. Martha ClanKrasinski. She hasn't yet started them but has the medications. She has no known history of diabetic neuropathy or PAD 08/02/15; the patient comes back in this week. Unfortunately the base of her wound  is deteriorated. Last week I can only see a small screw however this week the base of the wound is dominated by plates and screws. There are 2 draining areas in the incision inferiorly. Culture from last week grew enterococcus cloacae I which should've been sensitive to the Augmentin and Septra. She has not been systemically unwell. 09/13/15; the patient returns to the clinic having [facility]. This appears to look taken very well. She is on IV antibiotics through a PICC line in her left arm. They are applying Xeroform gauze, Kerlix and an Ace wrap over the wound. She has 2 small open areas in the middle of one of the flaps however these looks fairly benign. Most of her sutures appear to be holding on quite nicely there is still some swelling of the tissue although the even this doesn't look dramatic. There is no evidence of infection. She follows up with her surgeon some time next week. According the patient that that did not remove any of the hardware but "washed it out quite well" 10/11/15; the patient comes back in follow-up of the 2 small wounds over her flap on the lateral left leg roughly over the head of her fibula. All the wounds are closed here. The patient is ambulating on her own with a walker. She asked for copies of our photos of the wounds which we will attempt to provide [name], [name] ([id number]) Objective Constitutional Sitting or standing Blood Pressure is within target range for patient.. Pulse regular and within target range for patient.[name]. Respirations regular, non-labored and within target range.. Temperature is normal and within the target range for the patient.. Patient's appearance is neat and clean. Appears in no acute distress. Well nourished and well developed.. Vitals Time Taken: 2:40 PM, Height: 62 in, Weight: 233 lbs, BMI: 42.6, Temperature: 97.9 F, Pulse: 76 bpm, Respiratory Rate: 17 breaths/min, Blood Pressure: 122/74  mmHg. Cardiovascular Pedal pulses palpable and strong bilaterally.. Extremities are free of varicosities,  clubbing or edema. Peripheral pulses strong and equal. Capillary refill < 3 seconds.. General Notes: Wound exam; substantial surgical area on the medial aspect of her left leg just below her knee. There are no open areas here minimal swelling and no tenderness. Actually really remarkable outcome for this patient given how she first appeared to this clinic. Integumentary (Hair, Skin) Wound #3 status is Open. Original cause of wound was Surgical Injury. The wound is located on the Left,Proximal Lower Leg. The wound measures 0cm length x 0cm width x 0cm depth; 0cm^2 area and 0cm^3 volume. The wound is limited to skin breakdown. There is no tunneling or undermining noted. There is a none present amount of drainage noted. The wound margin is flat and intact. There is no granulation within the wound bed. There is no necrotic tissue within the wound bed. The periwound skin appearance exhibited: Localized Edema, Dry/Scaly. The periwound skin appearance did not exhibit: Callus, Crepitus, Excoriation, Fluctuance, Friable, Induration, Rash, Scarring, Maceration, Moist, Atrophie Blanche, Cyanosis, Ecchymosis, Hemosiderin Staining, Mottled, Pallor, Rubor, Erythema. Periwound temperature was noted as No Abnormality. Wound #4 status is Open. Original cause of wound was Surgical Injury. The wound is located on the Left,Distal Lower Leg. The wound measures 0cm length x 0cm width x 0cm depth; 0cm^2 area and 0cm^3 volume. The wound is limited to skin breakdown. There is no tunneling or undermining noted. There is a none present amount of drainage noted. The wound margin is flat and intact. There is no granulation within the wound bed. There is no necrotic tissue within the wound bed. The periwound skin appearance exhibited: Localized Edema, Dry/Scaly. The periwound skin appearance did not exhibit: Callus,  Crepitus, Excoriation, Fluctuance, Friable, Induration, Rash, Scarring, Maceration, Moist, Atrophie Blanche, Cyanosis, Ecchymosis, Hemosiderin Staining, Mottled, Pallor, Rubor, Erythema. Periwound temperature was noted as No Abnormality. The periwound has tenderness on palpation. [name], [name] ([id number]) Assessment Active Problems ICD-10 T81.31XD - Disruption of external operation (surgical) wound, not elsewhere classified, subsequent encounter E11.622 - Type 2 diabetes mellitus with other skin ulcer [facility] Discharge from W[facility] Care Center - Treatment Completed Patient is really dong quite well. Requires no specific dressing or care at this point she can be discharged from the clinic Electronic Signature(s) Signed: 11/02/2015 7:33:08 AM [name], Michael MD Entered [name], Michael on 10/11/2015 15:46:06 [name], Brittany HalfERESA A. ([id number]) -------------------------------------------------------------------------------- SuperBill Details Patient Name: Brittany LowSCOGGINS, [name] Date of Service: 10/11/2015 Medical Record Patient Account Number: [id number] Number: Treating RN: Clover MealyAfful, RN, BSN, Paullina Sinkita 04-13-1966 (50 y.o. Other Clinician: Date of Birth/Sex: Female) Treating ROBSON, MICHAEL Primary Care Physician/Extender: Jodi MarbleG Burns, Harriett Physician: Tania AdeWeeks in Treatment: 4 Referring Physician: Fulton MoleBurns, Harriett Diagnosis Coding ICD-10 Codes Code Description Disruption of external operation (surgical) wound, not elsewhere classified, subsequent T81.31XD encounter E11.622 Type 2 diabetes mellitus with other skin ulcer Facility Procedures CPT4 Code: [id number] Description: [id number] - WOUND CARE VISIT-LEV 2 EST PT Modifier: Quantity: 1 Physician Procedures CPT4: Description Modifier Quantity Code [id number] 99212 - WC PHYS LEVEL 2 - EST PT 1 ICD-10 Description Diagnosis T81.31XD Disruption of external operation (surgical) wound, not elsewhere classified,  subsequent encounter Electronic Signature(s) Signed: 10/11/2015 5:07:38 PM By: Elpidio EricAfful, Rita BSN, RN Signed: 11/02/2015 7:33:08 AM [name], Michael MD Entered By: Elpidio EricAfful, Rita on 10/11/2015 17:07:38

## 2015-12-30 IMAGING — CT CT CHEST W/ CM
2 of 3 series · 15 of 36 positions shown, 18 images · IV contrast (omnipaque)
Comparison: 06/01/2014

CLINICAL DATA: Recurrent pneumonia and pleural effusion.

EXAM:
CT CHEST WITH CONTRAST
TECHNIQUE: Multidetector CT imaging of the chest was performed during
intravenous contrast administration.
CONTRAST:  75mL OMNIPAQUE IOHEXOL 300 MG/ML  SOLN

[Series 2: routine chest with · axial · 0.65mm/px · z∈[-564,-339]mm · 12 of 53 slices shown, 15 images]
[im 4/53  mediastinal]
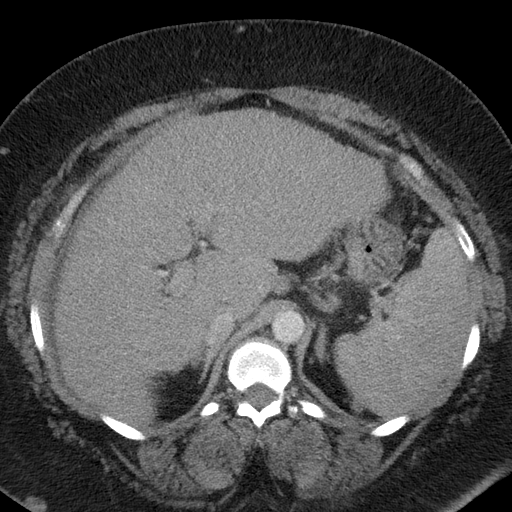
[im 4/53  lung]
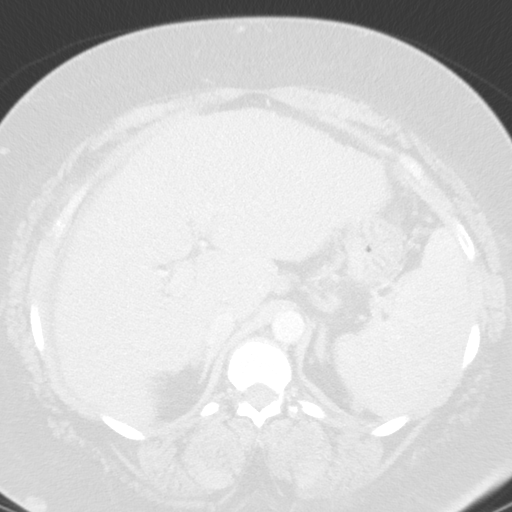
[im 8/53  lung]
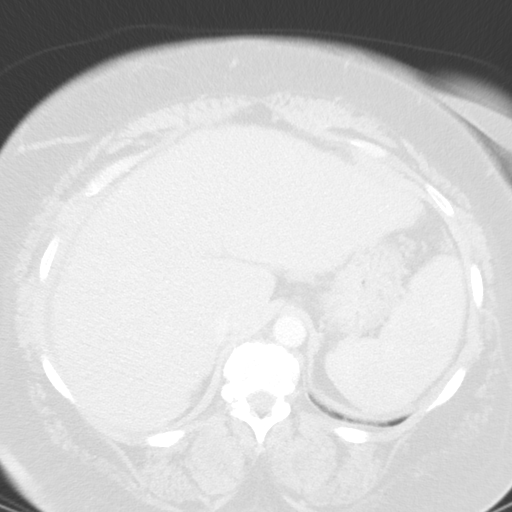
[im 12/53  lung]
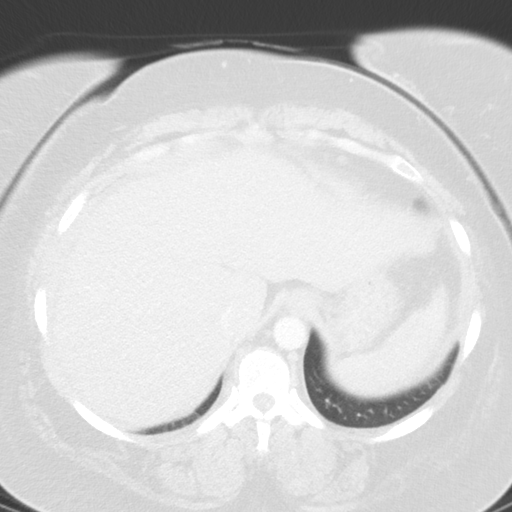
[im 16/53  lung]
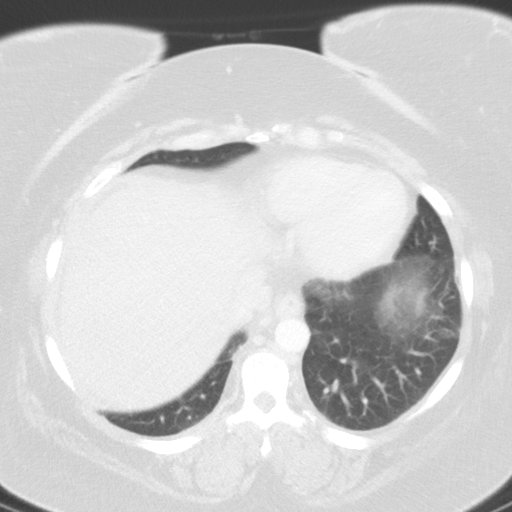
[im 20/53  mediastinal]
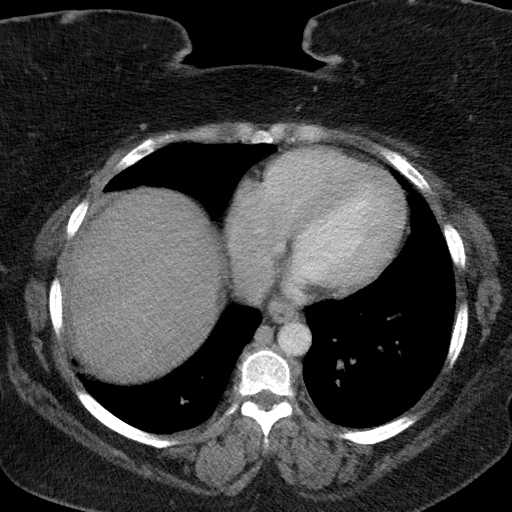
[im 20/53  lung]
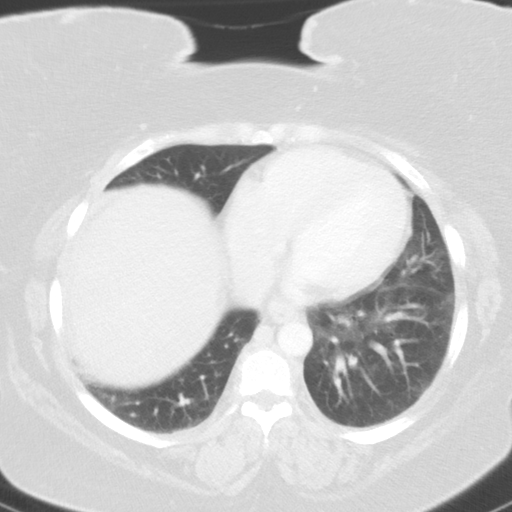
[im 24/53  lung]
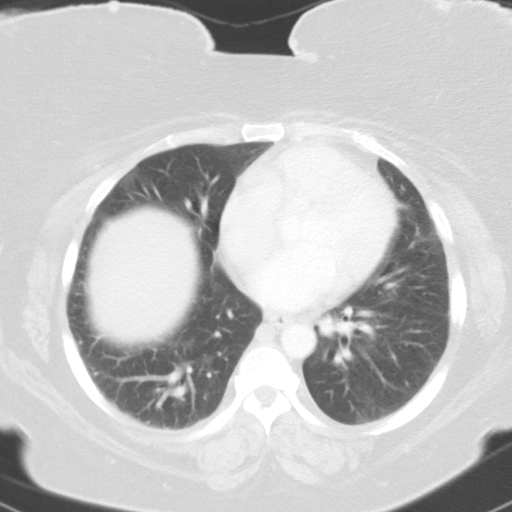
[im 29/53  lung]
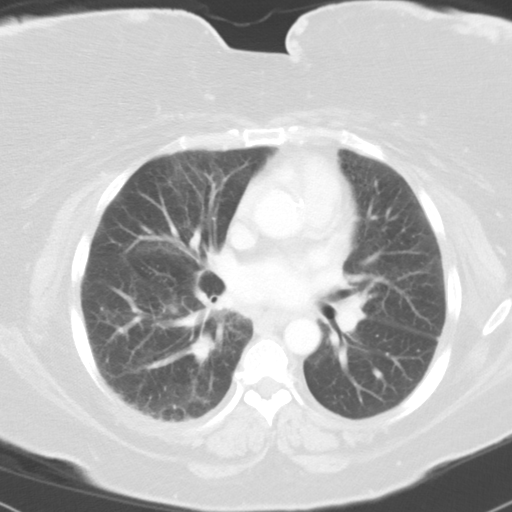
[im 33/53  lung]
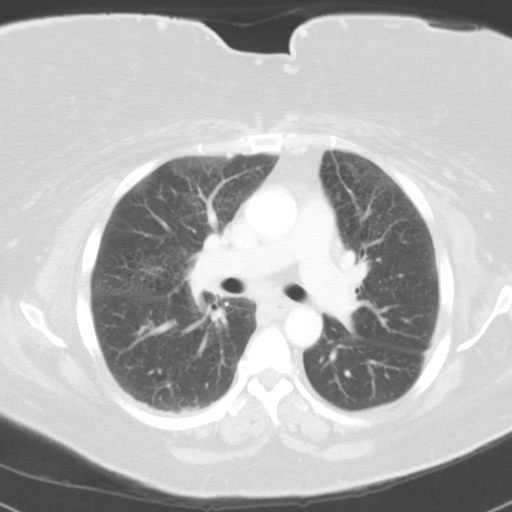
[im 37/53  mediastinal]
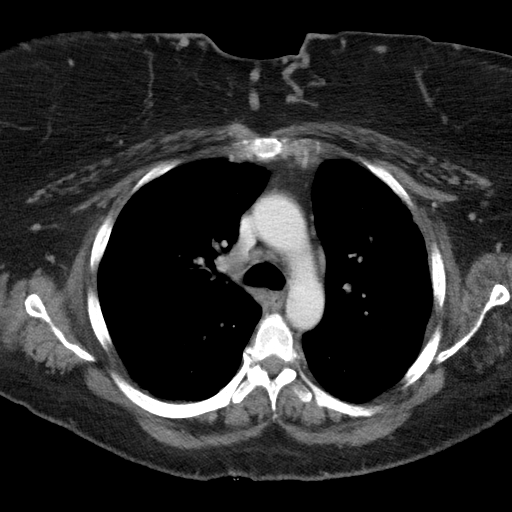
[im 37/53  lung]
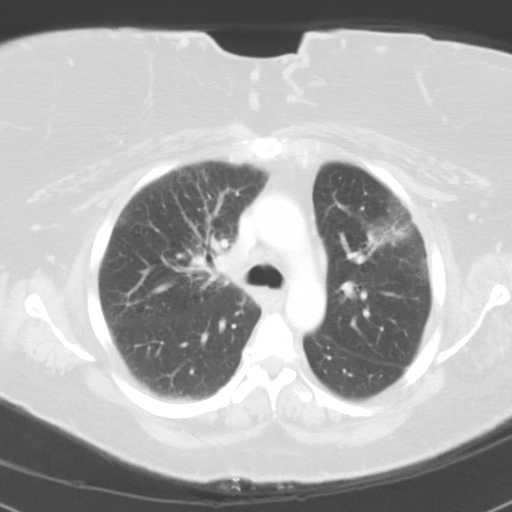
[im 41/53  lung]
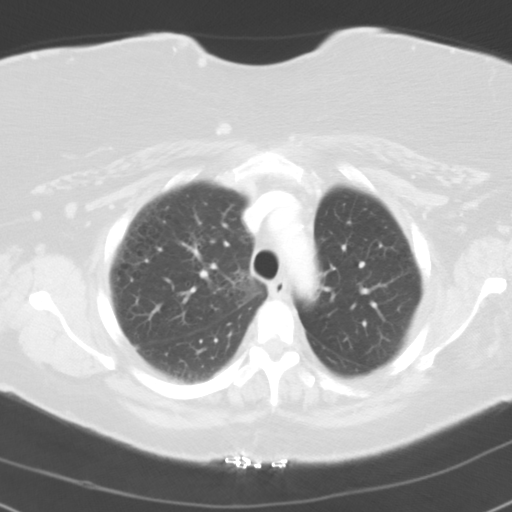
[im 45/53  lung]
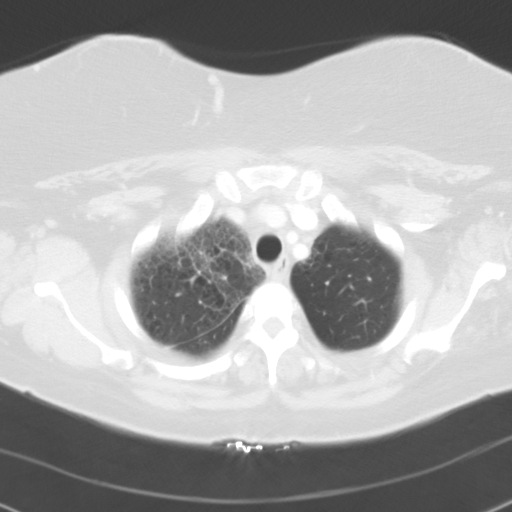
[im 49/53  lung]
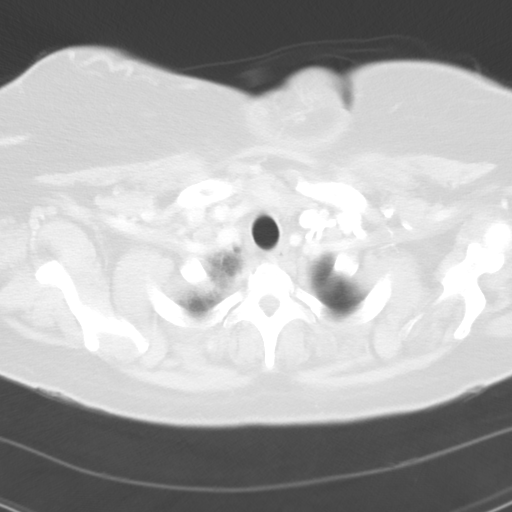

[Series 5: cor routine chest with · coronal · 0.54mm/px · 3 of 143 slices shown]
[im 29/143  lung]
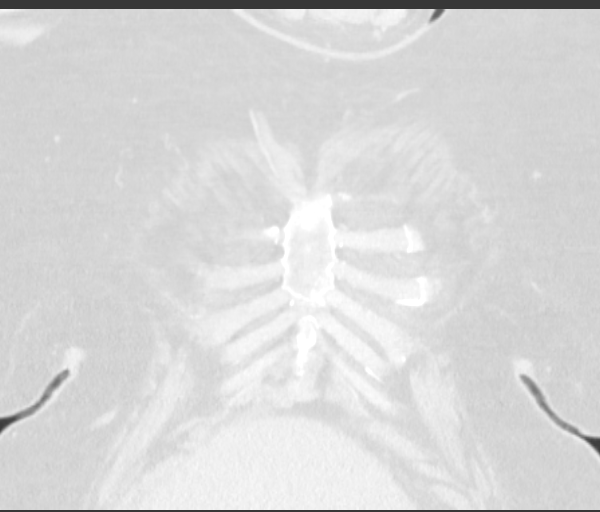
[im 57/143  lung]
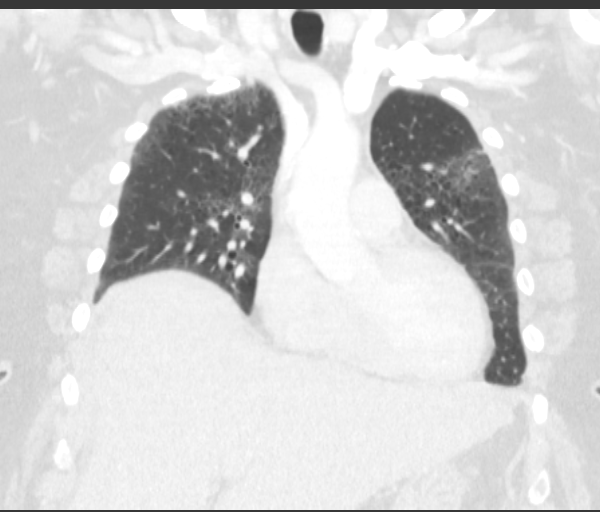
[im 86/143  lung]
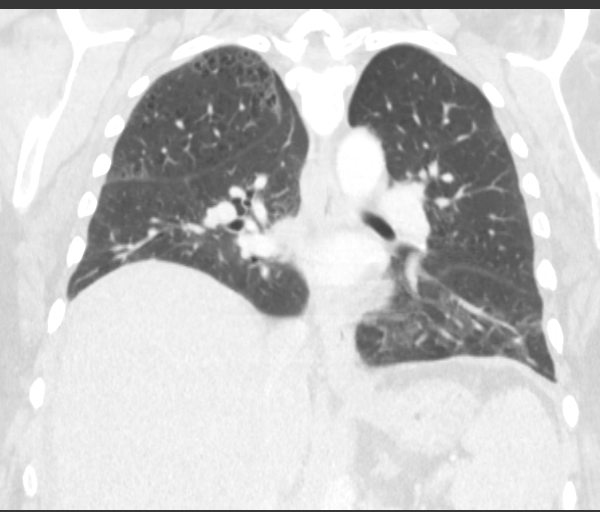

[15 of 36 positions shown; findings below may reference images not displayed]

FINDINGS: THORACIC INLET/BODY WALL:

Multiple enlarged vein in the upper chest wall, likely related to
intra-abdominal pathology

MEDIASTINUM:

Normal heart size. No pericardial effusion. No acute vascular
abnormality. Mild prominence of subcarinal lymph nodes without
discrete adenopathy.

LUNG WINDOWS:

There is a small linear (especially on coronal reformats) opacity in
the left upper lung which correlates was 09/29/2014 chest x-ray. The
morphology is consistent with atelectasis or scarring, presumably
from previous pneumonia. Lack of nodular morphology and rapid
development is inconsistent with a neoplastic process. Patchy
emphysematous change with interstitial coarsening. Lung volumes are
low.

UPPER ABDOMEN:

Visible portions of the liver show caudate lobe and left lobe
enlargement. Visualized portions of the spleen are generous in size.
Small perihepatic ascites is present.

OSSEOUS:

No acute fracture.  No suspicious lytic or blastic lesions.
IMPRESSION: 1. Scar-like opacity in the left upper lobe has developed since to
[DATE], correlating with 09/29/2014 chest x-ray when pneumonia was
suspected.
2. Emphysema and possible superimposed apical interstitial lung
disease.
3. Suspect cirrhosis and portal hypertension. Correlate with risk
factors.

## 2016-01-24 DIAGNOSIS — S8990XA Unspecified injury of unspecified lower leg, initial encounter: Secondary | ICD-10-CM | POA: Insufficient documentation

## 2016-01-24 DIAGNOSIS — G5782 Other specified mononeuropathies of left lower limb: Secondary | ICD-10-CM | POA: Insufficient documentation

## 2016-01-24 DIAGNOSIS — Z79899 Other long term (current) drug therapy: Secondary | ICD-10-CM | POA: Insufficient documentation

## 2016-07-08 IMAGING — CR DG KNEE COMPLETE 4+V*L*
1 series · 4 of 4 positions shown · non-contrast
Comparison: Left knee radiograph dated 04/21/2005

CLINICAL DATA: 50-year-old female with left knee trauma and pain

EXAM:
LEFT KNEE - COMPLETE 4+ VIEW

[Series 1: dg knee complete 4 views left · 0.14mm/px · 4 of 4 slices shown]
[im 1/4]
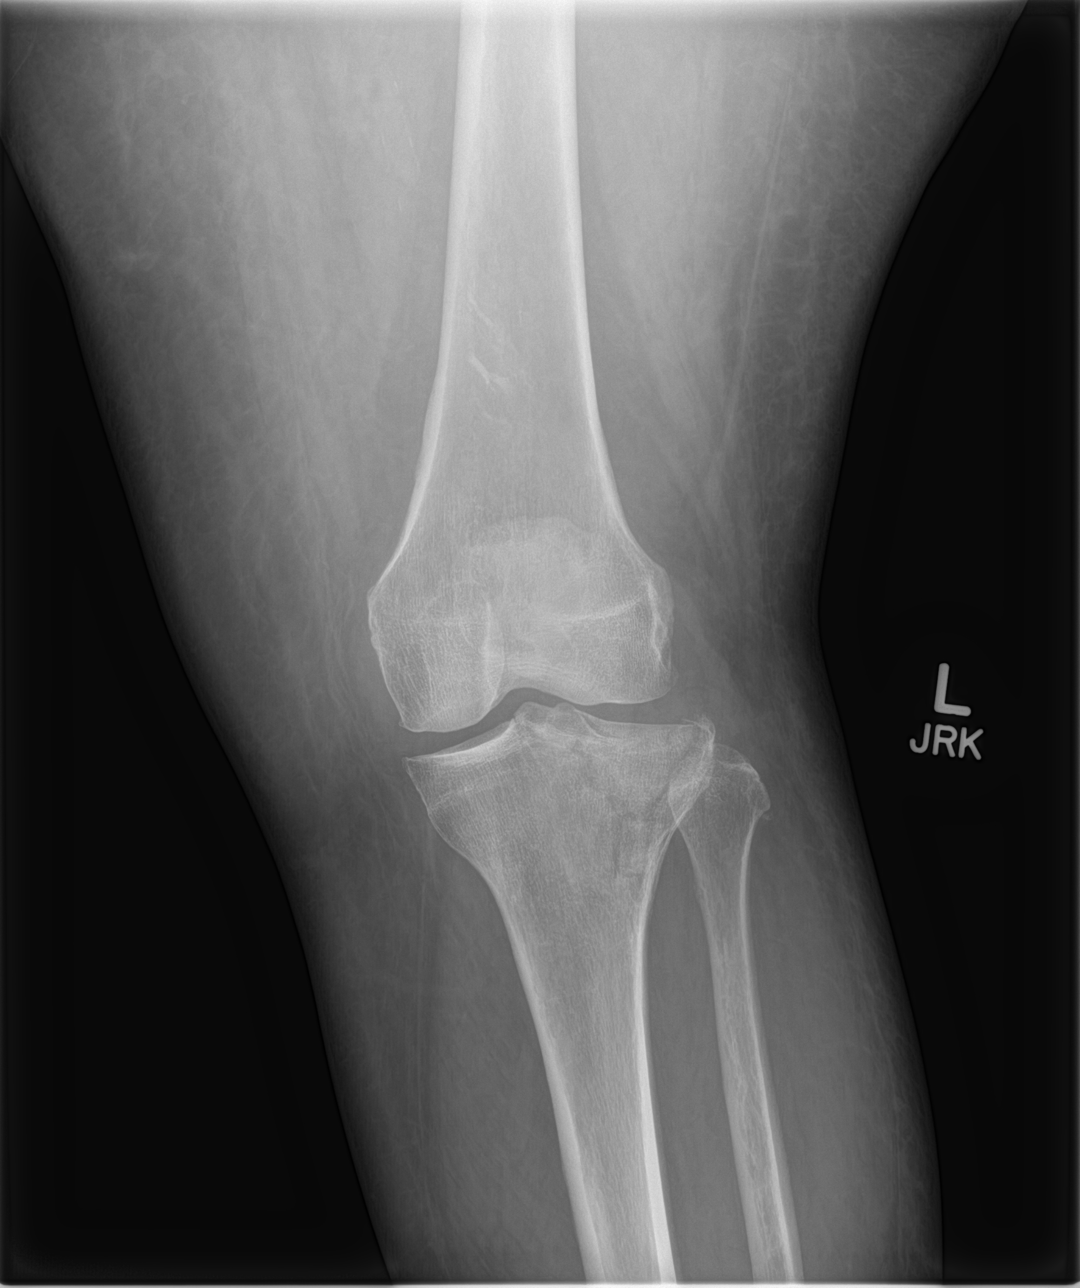
[im 2/4]
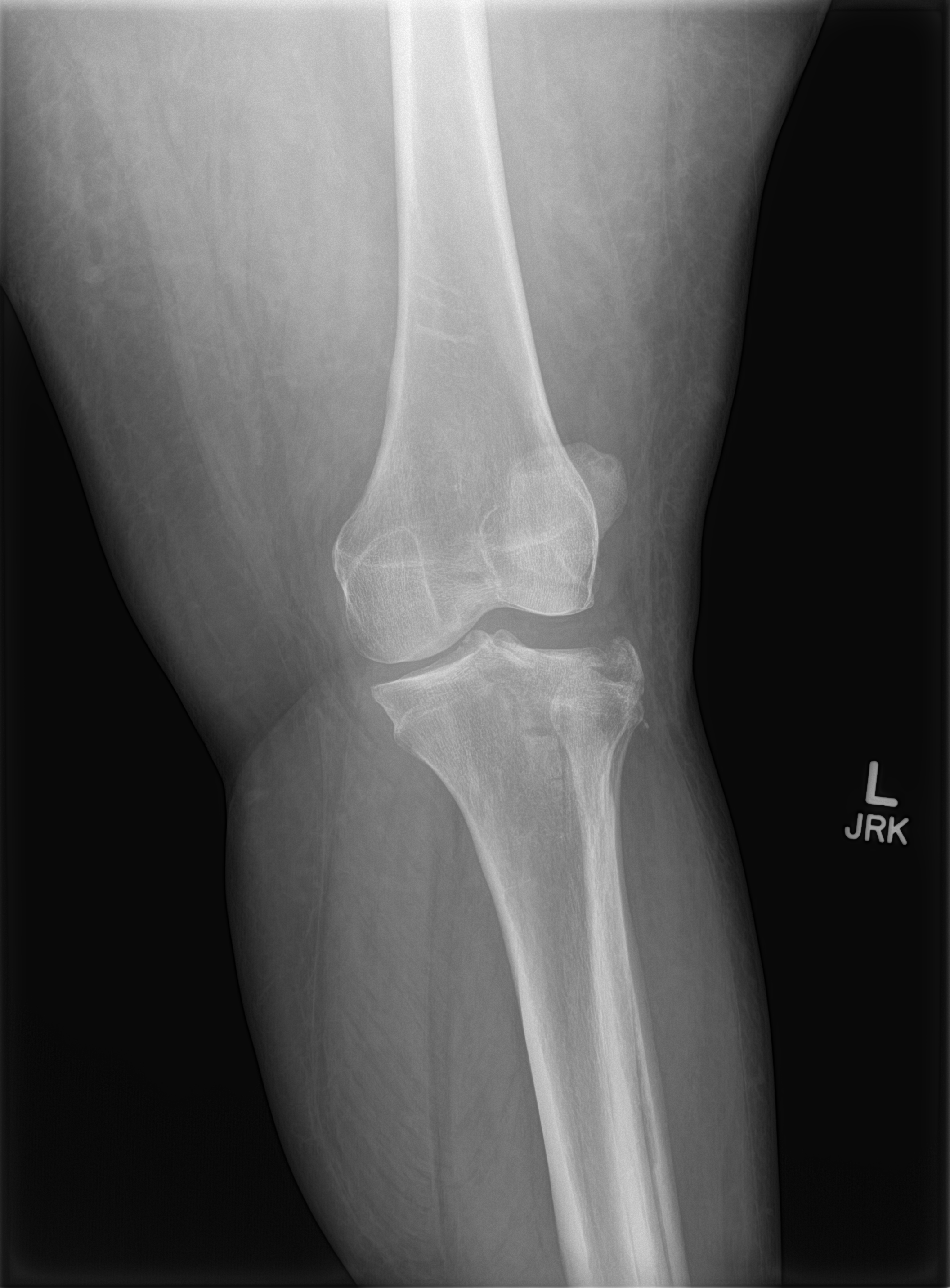
[im 3/4]
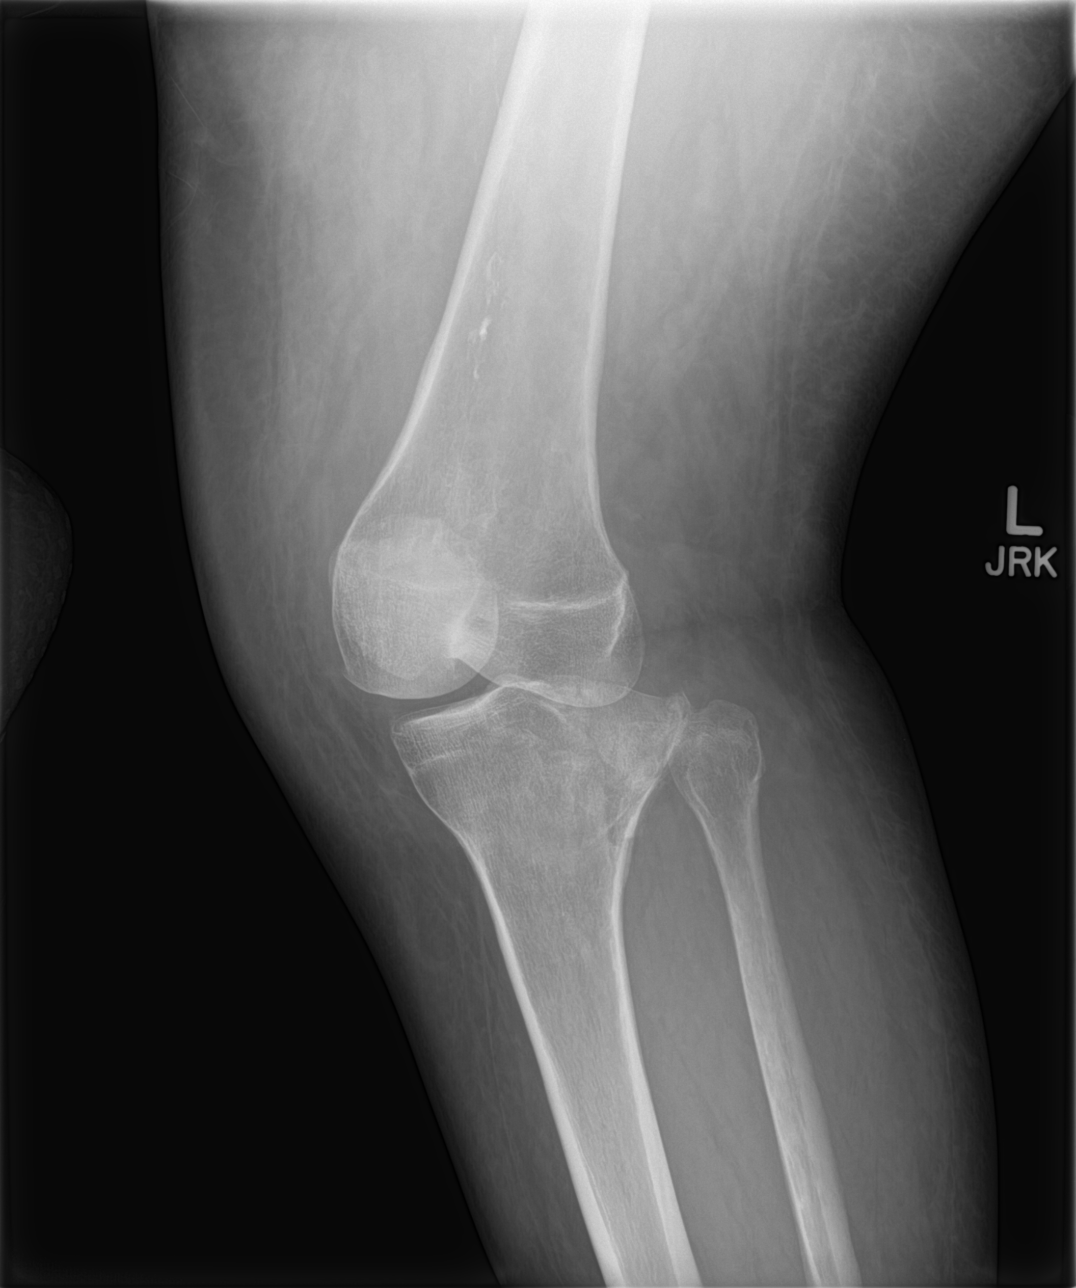
[im 4/4]
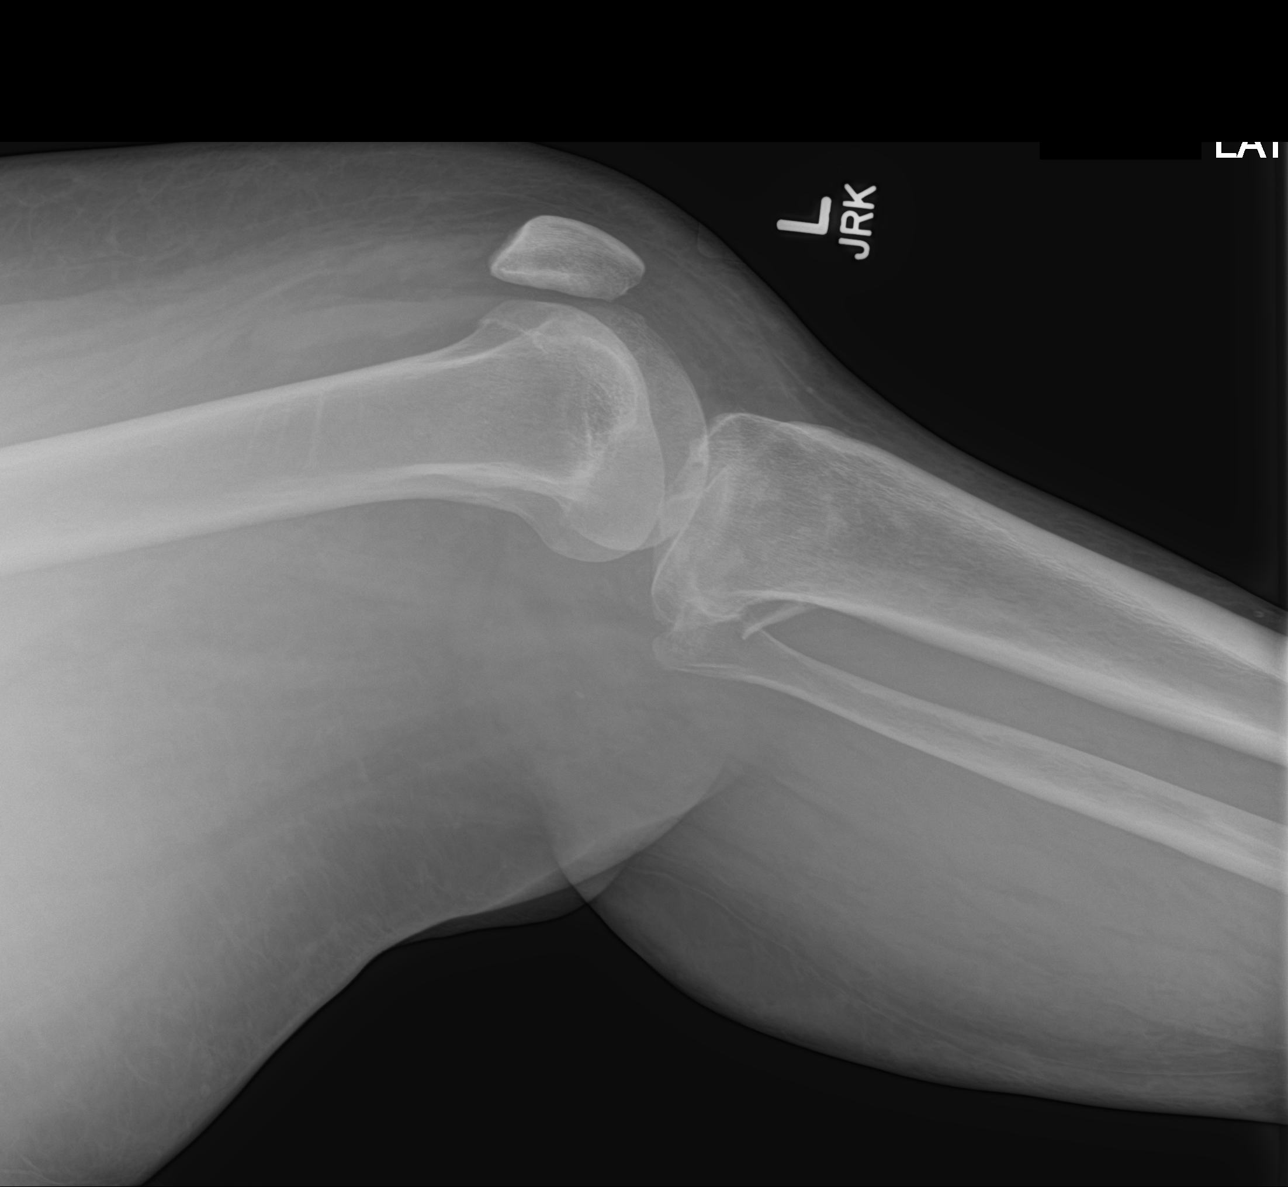

[4 of 4 positions shown; findings below may reference images not displayed]

FINDINGS: There is comminuted appearing fracture of the lateral aspect of the
proximal tibial metaphysis with extension of the fracture line to
the lateral tibial plateau. There is associated mild depression of
the lateral tibial plateau and widening of the lateral knee
compartment concerning for underlying ligamentous and/or meniscal
injury. No other fracture identified. The bones are osteopenic.
There is a suprapatellar joint effusion. There is mild soft tissue
swelling of the knee. No radiopaque foreign object.
IMPRESSION: Comminuted appearing depressed fracture of the lateral tibial
plateau with widening of the lateral knee compartment. Findings
concerning for underlying meniscal/ligamentous injury. Orthopedic
consult is advised.

## 2016-09-06 ENCOUNTER — Encounter: Payer: Self-pay | Admitting: Podiatry

## 2016-09-06 ENCOUNTER — Ambulatory Visit (INDEPENDENT_AMBULATORY_CARE_PROVIDER_SITE_OTHER): Payer: Medicaid Other

## 2016-09-06 ENCOUNTER — Ambulatory Visit (INDEPENDENT_AMBULATORY_CARE_PROVIDER_SITE_OTHER): Payer: Medicaid Other | Admitting: Podiatry

## 2016-09-06 DIAGNOSIS — M79675 Pain in left toe(s): Secondary | ICD-10-CM

## 2016-09-06 DIAGNOSIS — E0842 Diabetes mellitus due to underlying condition with diabetic polyneuropathy: Secondary | ICD-10-CM

## 2016-09-06 DIAGNOSIS — M79672 Pain in left foot: Secondary | ICD-10-CM

## 2016-09-06 DIAGNOSIS — M79605 Pain in left leg: Secondary | ICD-10-CM

## 2016-09-06 DIAGNOSIS — G8929 Other chronic pain: Secondary | ICD-10-CM

## 2016-09-06 DIAGNOSIS — B351 Tinea unguium: Secondary | ICD-10-CM

## 2016-09-06 DIAGNOSIS — M792 Neuralgia and neuritis, unspecified: Secondary | ICD-10-CM | POA: Diagnosis not present

## 2016-09-06 DIAGNOSIS — M79674 Pain in right toe(s): Secondary | ICD-10-CM

## 2016-09-09 ENCOUNTER — Telehealth: Payer: Self-pay | Admitting: *Deleted

## 2016-09-09 DIAGNOSIS — G8929 Other chronic pain: Secondary | ICD-10-CM

## 2016-09-09 DIAGNOSIS — M79605 Pain in left leg: Principal | ICD-10-CM

## 2016-09-09 NOTE — Progress Notes (Signed)
   SUBJECTIVE Patient with a history of diabetes mellitus presents to office today complaining of elongated, thickened nails. Pain while ambulating in shoes. Patient is unable to trim their own nails.  Patient also complains of severe pain diffusely throughout the left foot and ankle. Patient has a history of left leg trauma with ORIF and multiple complications with muscle flaps. 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 Patient has been seen by a pain management doctor in [place] however she is very unsatisfied with the care she has received. Patient would like to be referred to a pain management physician in [place].   OBJECTIVE General Patient is awake, alert, and oriented x 3 and in no acute distress. Derm Skin is dry and supple bilateral. Negative open lesions or macerations. Remaining integument unremarkable. Nails are tender, long, thickened and dystrophic with subungual debris, consistent with onychomycosis, 1-5 bilateral. No signs of infection noted. Vasc  DP and PT pedal pulses palpable bilaterally. Temperature gradient within normal limits.  Neuro Epicritic and protective threshold sensation diminished bilaterally.  Musculoskeletal Exam pain on palpation noted diffusely throughout the left foot and ankle.  ASSESSMENT 1. Diabetes Mellitus w/ peripheral neuropathy 2. Onychomycosis of nail due to dermatophyte bilateral 3. History of left leg trauma with ORIF and complications 4. 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 Patient evaluated today. 2. Instructed to maintain good pedal hygiene and foot care. Stressed importance of controlling blood sugar.  3. Mechanical debridement of nails 1-5 bilaterally performed using a nail nipper. Filed with dremel without incident.  4. Today we're going to refer the patient to a pain management physician [place]  5. Return to clinic in 3 mos.     Felecia ShellingBrent M. Melvina Pangelinan,  DPM [facility]  Dr. Felecia ShellingBrent M. Tionne Carelli, DPM    [address]                                        [place], [place] [address]                Office [phone]  Fax [phone]) [phone]

## 2016-09-09 NOTE — Telephone Encounter (Addendum)
-----   Message from Felecia ShellingBrent M Evans, DPM sent at 09/09/2016  7:46 AM EDT ----- Regarding: Pain management referral Please refer patient to pain management physician [place] in [place].  Dx: Chronic pain left lower extremity secondary to trauma  Thanks, Dr. Logan BoresEvans. Faxed required referral form, clinicals and demographics to [facility].10/01/2016-Pt states she does not have the phone number or address to the pain management center. I spoke with pt and she states she needs to get the appt time. I spoke with [facility], pt's appt is 10/02/2016 at 9:30am. I called the appt time to pt.

## 2016-12-27 DIAGNOSIS — J9622 Acute and chronic respiratory failure with hypercapnia: Secondary | ICD-10-CM | POA: Insufficient documentation

## 2017-01-02 DIAGNOSIS — B1920 Unspecified viral hepatitis C without hepatic coma: Secondary | ICD-10-CM | POA: Insufficient documentation

## 2017-01-02 DIAGNOSIS — J849 Interstitial pulmonary disease, unspecified: Secondary | ICD-10-CM | POA: Insufficient documentation

## 2017-01-02 DIAGNOSIS — G931 Anoxic brain damage, not elsewhere classified: Secondary | ICD-10-CM | POA: Insufficient documentation

## 2017-01-02 DIAGNOSIS — K7469 Other cirrhosis of liver: Secondary | ICD-10-CM | POA: Insufficient documentation

## 2017-01-20 ENCOUNTER — Ambulatory Visit: Payer: Medicaid Other | Admitting: Podiatry

## 2017-01-30 ENCOUNTER — Ambulatory Visit (INDEPENDENT_AMBULATORY_CARE_PROVIDER_SITE_OTHER): Payer: Medicaid Other | Admitting: Podiatry

## 2017-01-30 ENCOUNTER — Encounter: Payer: Self-pay | Admitting: Podiatry

## 2017-01-30 DIAGNOSIS — M79674 Pain in right toe(s): Principal | ICD-10-CM

## 2017-01-30 DIAGNOSIS — B351 Tinea unguium: Secondary | ICD-10-CM

## 2017-01-30 DIAGNOSIS — M79676 Pain in unspecified toe(s): Secondary | ICD-10-CM

## 2017-01-30 DIAGNOSIS — M79675 Pain in left toe(s): Principal | ICD-10-CM

## 2017-01-30 NOTE — Progress Notes (Signed)
Complaint:  Visit Type: Patient returns to my office for continued preventative foot care services. Complaint: Patient states" my nails have grown long and thick and become painful to walk and wear shoes" Patient has been diagnosed with DM with no foot complications. The patient presents for preventative foot care services. No changes to ROS  Podiatric Exam: Vascular: dorsalis pedis and posterior tibial pulses are palpable bilateral. Capillary return is immediate. Temperature gradient is WNL. Skin turgor WNL  Sensorium: Normal Semmes Weinstein monofilament test. Normal tactile sensation bilaterally. Nail Exam: Pt has thick disfigured discolored nails with subungual debris noted bilateral entire nail hallux through fifth toenails Ulcer Exam: There is no evidence of ulcer or pre-ulcerative changes or infection. Orthopedic Exam: Muscle tone and strength are WNL. No limitations in general ROM. No crepitus or effusions noted. Foot type and digits show no abnormalities. Bony prominences are unremarkable. Skin: No Porokeratosis. No infection or ulcers  Diagnosis:  Onychomycosis, , Pain in right toe, pain in left toes  Treatment & Plan Procedures and Treatment: Consent by patient was obtained for treatment procedures.   Debridement of mycotic and hypertrophic toenails, 1 through 5 bilateral and clearing of subungual debris. No ulceration, no infection noted.  Return Visit-Office Procedure: Patient instructed to return to the office for a follow up visit 3 months for continued evaluation and treatment.    Colbi Schiltz DPM 

## 2017-02-18 ENCOUNTER — Ambulatory Visit: Payer: Medicaid Other

## 2017-03-18 DIAGNOSIS — M3321 Polymyositis with respiratory involvement: Secondary | ICD-10-CM | POA: Insufficient documentation

## 2017-03-25 ENCOUNTER — Ambulatory Visit: Payer: Medicaid Other

## 2017-05-05 ENCOUNTER — Ambulatory Visit: Payer: Medicaid Other | Admitting: Podiatry

## 2017-06-20 DIAGNOSIS — Z5181 Encounter for therapeutic drug level monitoring: Secondary | ICD-10-CM | POA: Insufficient documentation

## 2017-07-01 ENCOUNTER — Ambulatory Visit: Payer: Medicaid Other

## 2017-07-08 ENCOUNTER — Encounter: Payer: Medicaid Other | Attending: Pulmonary Disease

## 2017-07-08 ENCOUNTER — Other Ambulatory Visit: Payer: Self-pay

## 2017-07-08 VITALS — Ht 61.5 in | Wt 249.4 lb

## 2017-07-08 DIAGNOSIS — Z794 Long term (current) use of insulin: Secondary | ICD-10-CM | POA: Diagnosis not present

## 2017-07-08 DIAGNOSIS — I10 Essential (primary) hypertension: Secondary | ICD-10-CM | POA: Insufficient documentation

## 2017-07-08 DIAGNOSIS — E119 Type 2 diabetes mellitus without complications: Secondary | ICD-10-CM | POA: Insufficient documentation

## 2017-07-08 DIAGNOSIS — Z79899 Other long term (current) drug therapy: Secondary | ICD-10-CM | POA: Insufficient documentation

## 2017-07-08 DIAGNOSIS — J849 Interstitial pulmonary disease, unspecified: Secondary | ICD-10-CM

## 2017-07-08 DIAGNOSIS — F17210 Nicotine dependence, cigarettes, uncomplicated: Secondary | ICD-10-CM | POA: Diagnosis not present

## 2017-07-08 NOTE — Patient Instructions (Signed)
Patient Instructions  Patient Details  Name: [name] A Shaul MRN: [id number] Date of Birth: Jun 22, 1965 Referring Provider:  Graciella BeltonVigeland, Christine L, *  Below are your personal goals for exercise, nutrition, and risk factors. Our goal is to help you stay on track towards obtaining and maintaining these goals. We will be discussing your progress on these goals with you throughout the program.  Initial Exercise Prescription: Initial Exercise Prescription - 07/08/17 1500      Date of Initial Exercise RX and Referring Provider   Date  07/08/17    Referring Provider  Normajean GlasgowVigeland, Christine MD       Oxygen   Oxygen  Continuous    Liters  2      Treadmill   MPH  1.9    Grade  1.5    Minutes  15    METs  2.91      REL-XR   Level  2    Speed  50    Minutes  15    METs  2      T5 Nustep   Level  2    SPM  80    Minutes  15    METs  2      Prescription Details   Frequency (times per week)  3    Duration  Progress to 45 minutes of aerobic exercise without signs/symptoms of physical distress      Intensity   THRR 40-80% of Max Heartrate  127-154    Ratings of Perceived Exertion  11-13    Perceived Dyspnea  0-4      Progression   Progression  Continue to progress workloads to maintain intensity without signs/symptoms of physical distress.      Resistance Training   Training Prescription  Yes    Weight  3 lbs    Reps  10-15       Exercise Goals: Frequency: Be able to perform aerobic exercise two to three times per week in program working toward 2-5 days per week of home exercise.  Intensity: Work with a perceived exertion of 11 (fairly light) - 15 (hard) while following your exercise prescription.  We will make changes to your prescription with you as you progress through the program.   Duration: Be able to do 30 to 45 minutes of continuous aerobic exercise in addition to a 5 minute warm-up and a 5 minute cool-down routine.   Nutrition Goals: Your personal nutrition goals  will be established when you do your nutrition analysis with the dietician.  The following are general nutrition guidelines to follow: Cholesterol < 200mg /day Sodium < 1500mg /day Fiber: Women over 50 yrs - 21 grams per day  Personal Goals: Personal Goals and Risk Factors at Admission - 07/08/17 1456      Core Components/Risk Factors/Patient Goals on Admission    Weight Management  Yes;Weight Loss;Obesity    Intervention  Weight Management: Develop a combined nutrition and exercise program designed to reach desired caloric intake, while maintaining appropriate intake of nutrient and fiber, sodium and fats, and appropriate energy expenditure required for the weight goal.;Weight Management: Provide education and appropriate resources to help participant work on and attain dietary goals.;Weight Management/Obesity: Establish reasonable short term and long term weight goals.    Admit Weight  249 lb 6.4 oz (113.1 kg)    Goal Weight: Short Term  244 lb (110.7 kg)    Goal Weight: Long Term  160 lb (72.6 kg)    Expected Outcomes  Short Term:  Continue to assess and modify interventions until short term weight is achieved;Long Term: Adherence to nutrition and physical activity/exercise program aimed toward attainment of established weight goal;Weight Maintenance: Understanding of the daily nutrition guidelines, which includes 25-35% calories from fat, 7% or less cal from saturated fats, less than 200mg  cholesterol, less than 1.5gm of sodium, & 5 or more servings of fruits and vegetables daily;Weight Loss: Understanding of general recommendations for a balanced deficit meal plan, which promotes 1-2 lb weight loss per week and includes a negative energy balance of [phone] kcal/d;Understanding recommendations for meals to include 15-35% energy as protein, 25-35% energy from fat, 35-60% energy from carbohydrates, less than 200mg  of dietary cholesterol, 20-35 gm of total fiber daily;Understanding of distribution of  calorie intake throughout the day with the consumption of 4-5 meals/snacks    Tobacco Cessation  Yes    Number of packs per day  .25 pack/day    Intervention  Assist the participant in steps to quit. Provide individualized education and counseling about committing to Tobacco Cessation, relapse prevention, and pharmacological support that can be provided by physician.;Education officer, environmental, assist with locating and accessing local/national Quit Smoking programs, and support quit date choice.    Expected Outcomes  Short Term: Will demonstrate readiness to quit, by selecting a quit date.;Long Term: Complete abstinence from all tobacco products for at least 12 months from quit date.;Short Term: Will quit all tobacco product use, adhering to prevention of relapse plan.    Improve shortness of breath with ADL's  Yes    Intervention  Provide education, individualized exercise plan and daily activity instruction to help decrease symptoms of SOB with activities of daily living.    Expected Outcomes  Short Term: Improve cardiorespiratory fitness to achieve a reduction of symptoms when performing ADLs;Long Term: Be able to perform more ADLs without symptoms or delay the onset of symptoms    Diabetes  Yes    Intervention  Provide education about signs/symptoms and action to take for hypo/hyperglycemia.;Provide education about proper nutrition, including hydration, and aerobic/resistive exercise prescription along with prescribed medications to achieve blood glucose in normal ranges: Fasting glucose 65-99 mg/dL    Expected Outcomes  Short Term: Participant verbalizes understanding of the signs/symptoms and immediate care of hyper/hypoglycemia, proper foot care and importance of medication, aerobic/resistive exercise and nutrition plan for blood glucose control.;Long Term: Attainment of HbA1C < 7%.    Hypertension  Yes    Intervention  Provide education on lifestyle modifcations including regular physical  activity/exercise, weight management, moderate sodium restriction and increased consumption of fresh fruit, vegetables, and low fat dairy, alcohol moderation, and smoking cessation.;Monitor prescription use compliance.    Expected Outcomes  Short Term: Continued assessment and intervention until BP is < 140/70mm HG in hypertensive participants. < 130/19mm HG in hypertensive participants with diabetes, heart failure or chronic kidney disease.;Long Term: Maintenance of blood pressure at goal levels.       Tobacco Use Initial Evaluation: Social History   Tobacco Use  Smoking Status Current Every Day Smoker  . Packs/day: 0.25  . Years: 36.00  . Pack years: 9.00  . Types: Cigarettes  Smokeless Tobacco Never Used    Exercise Goals and Review: Exercise Goals    Row Name 07/08/17 1559             Exercise Goals   Increase Physical Activity  Yes       Intervention  Provide advice, education, support and counseling about physical activity/exercise needs.;Develop an individualized exercise  prescription for aerobic and resistive training based on initial evaluation findings, risk stratification, comorbidities and participant's personal goals.       Expected Outcomes  Short Term: Attend rehab on a regular basis to increase amount of physical activity.;Long Term: Add in home exercise to make exercise part of routine and to increase amount of physical activity.;Long Term: Exercising regularly at least 3-5 days a week.       Increase Strength and Stamina  Yes       Intervention  Provide advice, education, support and counseling about physical activity/exercise needs.;Develop an individualized exercise prescription for aerobic and resistive training based on initial evaluation findings, risk stratification, comorbidities and participant's personal goals.       Expected Outcomes  Short Term: Increase workloads from initial exercise prescription for resistance, speed, and METs.;Short Term: Perform  resistance training exercises routinely during rehab and add in resistance training at home;Long Term: Improve cardiorespiratory fitness, muscular endurance and strength as measured by increased METs and functional capacity ( )       Able to understand and use rate of perceived exertion (RPE) scale  Yes       Intervention  Provide education and explanation on how to use RPE scale       Expected Outcomes  Short Term: Able to use RPE daily in rehab to express subjective intensity level;Long Term:  Able to use RPE to guide intensity level when exercising independently       Able to understand and use Dyspnea scale  Yes       Intervention  Provide education and explanation on how to use Dyspnea scale       Expected Outcomes  Short Term: Able to use Dyspnea scale daily in rehab to express subjective sense of shortness of breath during exertion;Long Term: Able to use Dyspnea scale to guide intensity level when exercising independently       Knowledge and understanding of Target Heart Rate Range (THRR)  Yes       Intervention  Provide education and explanation of THRR including how the numbers were predicted and where they are located for reference       Expected Outcomes  Short Term: Able to state/look up THRR;Long Term: Able to use THRR to govern intensity when exercising independently;Short Term: Able to use daily as guideline for intensity in rehab       Able to check pulse independently  Yes       Intervention  Provide education and demonstration on how to check pulse in carotid and radial arteries.;Review the importance of being able to check your own pulse for safety during independent exercise       Expected Outcomes  Short Term: Able to explain why pulse checking is important during independent exercise;Long Term: Able to check pulse independently and accurately       Understanding of Exercise Prescription  Yes       Intervention  Provide education, explanation, and written materials on patient's  individual exercise prescription       Expected Outcomes  Short Term: Able to explain program exercise prescription;Long Term: Able to explain home exercise prescription to exercise independently          Copy of goals given to participant.

## 2017-07-08 NOTE — Progress Notes (Signed)
Pulmonary Individual Treatment Plan  Patient Details  Name: [name] MRN: [id number] Date of Birth: May 06, 1965 Referring Provider:     Pulmonary Rehab from 07/08/2017 in [facility] Cardiac and Pulmonary Rehab  Referring Provider  Nelda Bucks MD       Initial Encounter Date:    Pulmonary Rehab from 07/08/2017 in [facility] Cardiac and Pulmonary Rehab  Date  07/08/17  Referring Provider  Nelda Bucks MD       Visit Diagnosis: ILD (interstitial lung disease) ([place])  Patient's Home Medications on Admission:  Current Outpatient Medications:  .  albuterol (PROVENTIL HFA;VENTOLIN HFA) 108 (90 Base) MCG/ACT inhaler, Inhale into the lungs., Disp: , Rfl:  .  ALPRAZolam (XANAX) 1 MG tablet, Take 1 mg by mouth 3 (three) times daily., Disp: , Rfl:  .  carvedilol (COREG) 3.125 MG tablet, Take 3.125 mg by mouth., Disp: , Rfl:  .  enalapril (VASOTEC) 20 MG tablet, Take 20 mg by mouth., Disp: , Rfl:  .  enoxaparin (LOVENOX) 40 MG/0.4ML injection, Inject 0.4 mLs (40 mg total) into the skin every 12 (twelve) hours., Disp: 60 Syringe, Rfl: 0 .  exenatide (BYETTA) 5 MCG/0.02ML SOPN injection, Inject into the skin., Disp: , Rfl:  .  fluticasone (FLONASE) 50 MCG/ACT nasal spray, Place 2 sprays into both nostrils daily., Disp: , Rfl:  .  Fluticasone-Salmeterol (ADVAIR DISKUS) 250-50 MCG/DOSE AEPB, INHALE 1 PUFF BY MOUTH TWICE A DAY FOR COPD, Disp: , Rfl:  .  furosemide (LASIX) 20 MG tablet, TAKE 1 TABLET BY MOUTH EVERY MORNING FOR FLUID RETENTION X 3 DAYS, Disp: , Rfl: 2 .  insulin detemir (LEVEMIR) 100 UNIT/ML injection, Inject 10-20 Units into the skin 2 (two) times daily. 20 units in morning and 10 units in evening (may add additional 20 units in evening depending on blood sugar), Disp: , Rfl:  .  lidocaine (XYLOCAINE) 5 % ointment, APPLY 1 APPLICATION TOPICALLY TWO (2) TIMES A DAY. APPLY 5 GRAMS TO AREA UP TO TWICE DAILY, Disp: , Rfl: 3 .  losartan (COZAAR) 25 MG tablet, Take 25 mg by mouth  daily., Disp: , Rfl:  .  oxyCODONE (OXY IR/ROXICODONE) 5 MG immediate release tablet, Take 1-2 tablets (5-10 mg total) by mouth every 3 (three) hours as needed for breakthrough pain., Disp: 60 tablet, Rfl: 0 .  pravastatin (PRAVACHOL) 40 MG tablet, Take 40 mg by mouth at bedtime., Disp: , Rfl:  .  tiotropium (SPIRIVA) 18 MCG inhalation capsule, Place 18 mcg into inhaler and inhale daily., Disp: , Rfl:   Past Medical History: Past Medical History:  Diagnosis Date  . Bleeding disorder ([place])   . Diabetes mellitus without complication ([place])   . Hepatitis C, acute   . Hypertension   . Sleep apnea     Tobacco Use: Social History   Tobacco Use  Smoking Status Current Every Day Smoker  . Packs/day: 0.25  . Years: 36.00  . Pack years: 9.00  . Types: Cigarettes  Smokeless Tobacco Never Used    Labs: Recent Review Flowsheet Data    Labs for ITP Cardiac and Pulmonary Rehab Latest Ref Rng & Units 04/09/2014   Hemoglobin A1c 4.2 - 6.3 % 7.1(H)       Pulmonary Assessment Scores: Pulmonary Assessment Scores    Row Name 07/08/17 1444         ADL UCSD   ADL Phase  Entry     SOB Score total  103     Rest  3  Walk  5     Stairs  5     Bath  4     Dress  5     Shop  5       CAT Score   CAT Score  37       mMRC Score   mMRC Score  2        Pulmonary Function Assessment: Pulmonary Function Assessment - 07/08/17 1458      Breath   Bilateral Breath Sounds  Clear    Shortness of Breath  Limiting activity;Yes;Fear of Shortness of Breath       Exercise Target Goals: Date: 07/08/17  Exercise Program Goal: Individual exercise prescription set using results from initial 6 min walk test and THRR while considering  patient's activity barriers and safety.    Exercise Prescription Goal: Initial exercise prescription builds to 30-45 minutes a day of aerobic activity, 2-3 days per week.  Home exercise guidelines will be given to patient during program as part of exercise  prescription that the participant will acknowledge.  Activity Barriers & Risk Stratification: Activity Barriers & Cardiac Risk Stratification - 07/08/17 1554      Activity Barriers & Cardiac Risk Stratification   Activity Barriers  Deconditioning;Muscular Weakness;Back Problems;Shortness of Breath;Other (comment)    Comments  Back tightens/spasms with walking, plate in l leg,  upper body weakness       6 Minute Walk: 6 Minute Walk    Row Name 07/08/17 1552         6 Minute Walk   Phase  Initial     Distance  1020 feet     Walk Time  6 minutes     # of Rest Breaks  0     MPH  1.93     METS  2.84     RPE  14     Perceived Dyspnea   2     VO2 Peak  9.93     Symptoms  Yes (comment)     Comments  back tight at end of test     Resting HR  99 bpm     Resting BP  126/62     Resting Oxygen Saturation   97 %     Exercise Oxygen Saturation  during 6 min walk  92 %     Max Ex. HR  125 bpm     Max Ex. BP  156/74     2 Minute Post BP  132/70       Interval HR   1 Minute HR  118     2 Minute HR  123     3 Minute HR  117     4 Minute HR  118     5 Minute HR  120     6 Minute HR  125     2 Minute Post HR  105     Interval Heart Rate?  Yes       Interval Oxygen   Interval Oxygen?  Yes     Baseline Oxygen Saturation %  97 %     1 Minute Oxygen Saturation %  94 %     1 Minute Liters of Oxygen  2 L continuous     2 Minute Oxygen Saturation %  93 %     2 Minute Liters of Oxygen  2 L     3 Minute Oxygen Saturation %  96 %     3 Minute Liters  of Oxygen  2 L     4 Minute Oxygen Saturation %  92 %     4 Minute Liters of Oxygen  2 L     5 Minute Oxygen Saturation %  92 %     5 Minute Liters of Oxygen  2 L     6 Minute Oxygen Saturation %  94 %     6 Minute Liters of Oxygen  2 L     2 Minute Post Oxygen Saturation %  100 %     2 Minute Post Liters of Oxygen  2 L       Oxygen Initial Assessment: Oxygen Initial Assessment - 07/08/17 1455      Home Oxygen   Home Oxygen Device   Home Concentrator;E-Tanks    Sleep Oxygen Prescription  CPAP    Liters per minute  2    Home Exercise Oxygen Prescription  Continuous    Liters per minute  2    Home at Rest Exercise Oxygen Prescription  Continuous    Liters per minute  2    Compliance with Home Oxygen Use  Yes      Initial 6 min Walk   Oxygen Used  Continuous    Liters per minute  2      Program Oxygen Prescription   Program Oxygen Prescription  Continuous    Liters per minute  2      Intervention   Short Term Goals  To learn and exhibit compliance with exercise, home and travel O2 prescription;To learn and understand importance of maintaining oxygen saturations>88%;To learn and demonstrate proper use of respiratory medications;To learn and demonstrate proper pursed lip breathing techniques or other breathing techniques.;To learn and understand importance of monitoring SPO2 with pulse oximeter and demonstrate accurate use of the pulse oximeter.    Long  Term Goals  Exhibits compliance with exercise, home and travel O2 prescription;Verbalizes importance of monitoring SPO2 with pulse oximeter and return demonstration;Maintenance of O2 saturations>88%;Exhibits proper breathing techniques, such as pursed lip breathing or other method taught during program session;Compliance with respiratory medication;Demonstrates proper use of MDI's       Oxygen Re-Evaluation:   Oxygen Discharge (Final Oxygen Re-Evaluation):   Initial Exercise Prescription: Initial Exercise Prescription - 07/08/17 1500      Date of Initial Exercise RX and Referring Provider   Date  07/08/17    Referring Provider  Nelda Bucks MD       Oxygen   Oxygen  Continuous    Liters  2      Treadmill   MPH  1.9    Grade  1.5    Minutes  15    METs  2.91      REL-XR   Level  2    Speed  50    Minutes  15    METs  2      T5 Nustep   Level  2    SPM  80    Minutes  15    METs  2      Prescription Details   Frequency (times per  week)  3    Duration  Progress to 45 minutes of aerobic exercise without signs/symptoms of physical distress      Intensity   THRR 40-80% of Max Heartrate  127-154    Ratings of Perceived Exertion  11-13    Perceived Dyspnea  0-4      Progression   Progression  Continue to progress  workloads to maintain intensity without signs/symptoms of physical distress.      Resistance Training   Training Prescription  Yes    Weight  3 lbs    Reps  10-15       Perform Capillary Blood Glucose checks as needed.  Exercise Prescription Changes: Exercise Prescription Changes    Row Name 07/08/17 1500             Response to Exercise   Blood Pressure (Admit)  126/62       Blood Pressure (Exercise)  156/74       Blood Pressure (Exit)  132/70       Heart Rate (Admit)  99 bpm       Heart Rate (Exercise)  125 bpm       Heart Rate (Exit)  105 bpm       Oxygen Saturation (Admit)  97 %       Oxygen Saturation (Exercise)  92 %       Oxygen Saturation (Exit)  100 %       Rating of Perceived Exertion (Exercise)  14       Perceived Dyspnea (Exercise)  2       Symptoms  back tightened at end       Comments  walk test results          Exercise Comments:   Exercise Goals and Review: Exercise Goals    Row Name 07/08/17 1559             Exercise Goals   Increase Physical Activity  Yes       Intervention  Provide advice, education, support and counseling about physical activity/exercise needs.;Develop an individualized exercise prescription for aerobic and resistive training based on initial evaluation findings, risk stratification, comorbidities and participant's personal goals.       Expected Outcomes  Short Term: Attend rehab on a regular basis to increase amount of physical activity.;Long Term: Add in home exercise to make exercise part of routine and to increase amount of physical activity.;Long Term: Exercising regularly at least 3-5 days a week.       Increase Strength and Stamina  Yes        Intervention  Provide advice, education, support and counseling about physical activity/exercise needs.;Develop an individualized exercise prescription for aerobic and resistive training based on initial evaluation findings, risk stratification, comorbidities and participant's personal goals.       Expected Outcomes  Short Term: Increase workloads from initial exercise prescription for resistance, speed, and METs.;Short Term: Perform resistance training exercises routinely during rehab and add in resistance training at home;Long Term: Improve cardiorespiratory fitness, muscular endurance and strength as measured by increased METs and functional capacity (6MWT)       Able to understand and use rate of perceived exertion (RPE) scale  Yes       Intervention  Provide education and explanation on how to use RPE scale       Expected Outcomes  Short Term: Able to use RPE daily in rehab to express subjective intensity level;Long Term:  Able to use RPE to guide intensity level when exercising independently       Able to understand and use Dyspnea scale  Yes       Intervention  Provide education and explanation on how to use Dyspnea scale       Expected Outcomes  Short Term: Able to use Dyspnea scale daily in rehab to express subjective sense of shortness of breath  during exertion;Long Term: Able to use Dyspnea scale to guide intensity level when exercising independently       Knowledge and understanding of Target Heart Rate Range (THRR)  Yes       Intervention  Provide education and explanation of THRR including how the numbers were predicted and where they are located for reference       Expected Outcomes  Short Term: Able to state/look up THRR;Long Term: Able to use THRR to govern intensity when exercising independently;Short Term: Able to use daily as guideline for intensity in rehab       Able to check pulse independently  Yes       Intervention  Provide education and demonstration on how to check  pulse in carotid and radial arteries.;Review the importance of being able to check your own pulse for safety during independent exercise       Expected Outcomes  Short Term: Able to explain why pulse checking is important during independent exercise;Long Term: Able to check pulse independently and accurately       Understanding of Exercise Prescription  Yes       Intervention  Provide education, explanation, and written materials on patient's individual exercise prescription       Expected Outcomes  Short Term: Able to explain program exercise prescription;Long Term: Able to explain home exercise prescription to exercise independently          Exercise Goals Re-Evaluation :   Discharge Exercise Prescription (Final Exercise Prescription Changes): Exercise Prescription Changes - 07/08/17 1500      Response to Exercise   Blood Pressure (Admit)  126/62    Blood Pressure (Exercise)  156/74    Blood Pressure (Exit)  132/70    Heart Rate (Admit)  99 bpm    Heart Rate (Exercise)  125 bpm    Heart Rate (Exit)  105 bpm    Oxygen Saturation (Admit)  97 %    Oxygen Saturation (Exercise)  92 %    Oxygen Saturation (Exit)  100 %    Rating of Perceived Exertion (Exercise)  14    Perceived Dyspnea (Exercise)  2    Symptoms  back tightened at end    Comments  walk test results       Nutrition:  Target Goals: Understanding of nutrition guidelines, daily intake of sodium <1542m, cholesterol <2064m calories 30% from fat and 7% or less from saturated fats, daily to have 5 or more servings of fruits and vegetables.  Biometrics: Pre Biometrics - 07/08/17 1559      Pre Biometrics   Height  5' 1.5" (1.562 m)    Weight  249 lb 6.4 oz (113.1 kg)    Waist Circumference  43 inches    Hip Circumference  50.5 inches    Waist to Hip Ratio  0.85 %    BMI (Calculated)  46.37        Nutrition Therapy Plan and Nutrition Goals: Nutrition Therapy & Goals - 07/08/17 1446      Personal Nutrition Goals    Comments  She would like to lose weight and eat healthier. She states her appitite has not been good.      Intervention Plan   Intervention  Nutrition handout(s) given to patient.;Prescribe, educate and counsel regarding individualized specific dietary modifications aiming towards targeted core components such as weight, hypertension, lipid management, diabetes, heart failure and other comorbidities.    Expected Outcomes  Short Term Goal: Understand basic principles of dietary content, such  as calories, fat, sodium, cholesterol and nutrients.;Long Term Goal: Adherence to prescribed nutrition plan.       Nutrition Assessments: Nutrition Assessments - 07/08/17 1443      MEDFICTS Scores   Pre Score  -- patient did not fill out paperwork       Nutrition Goals Re-Evaluation:   Nutrition Goals Discharge (Final Nutrition Goals Re-Evaluation):   Psychosocial: Target Goals: Acknowledge presence or absence of significant depression and/or stress, maximize coping skills, provide positive support system. Participant is able to verbalize types and ability to use techniques and skills needed for reducing stress and depression.   Initial Review & Psychosocial Screening: Initial Psych Review & Screening - 07/08/17 1452      Initial Review   Current issues with  Current Sleep Concerns;Current Stress Concerns    Source of Stress Concerns  Chronic Illness;Unable to perform yard/household activities;Unable to participate in former interests or hobbies;Retirement/disability    Comments  She had surgery on her leg and did not have full PT for her knee.      Family Dynamics   Good Support System?  Yes    Comments  Her daughter, brother and husband are good sources for support.      Barriers   Psychosocial barriers to participate in program  The patient should benefit from training in stress management and relaxation.      Screening Interventions   Interventions  Program counselor consult;Provide  feedback about the scores to participant;Encouraged to exercise;To provide support and resources with identified psychosocial needs    Expected Outcomes  Short Term goal: Utilizing psychosocial counselor, staff and physician to assist with identification of specific Stressors or current issues interfering with healing process. Setting desired goal for each stressor or current issue identified.;Long Term Goal: Stressors or current issues are controlled or eliminated.;Short Term goal: Identification and review with participant of any Quality of Life or Depression concerns found by scoring the questionnaire.;Long Term goal: The participant improves quality of Life and PHQ9 Scores as seen by post scores and/or verbalization of changes       Quality of Life Scores:  Scores of 19 and below usually indicate a poorer quality of life in these areas.  A difference of  2-3 points is a clinically meaningful difference.  A difference of 2-3 points in the total score of the Quality of Life Index has been associated with significant improvement in overall quality of life, self-image, physical symptoms, and general health in studies assessing change in quality of life.  PHQ-9: Recent Review Flowsheet Data    Depression screen [facility] 2/9 07/08/2017   Decreased Interest 3   Down, Depressed, Hopeless 1   PHQ - 2 Score 4   Altered sleeping 1   Tired, decreased energy 1   Change in appetite 1   Feeling bad or failure about yourself  0   Trouble concentrating 0   Moving slowly or fidgety/restless 0   Suicidal thoughts 0   PHQ-9 Score 7   Difficult doing work/chores Very difficult     Interpretation of Total Score  Total Score Depression Severity:  1-4 = Minimal depression, 5-9 = Mild depression, 10-14 = Moderate depression, 15-19 = Moderately severe depression, 20-27 = Severe depression   Psychosocial Evaluation and Intervention:   Psychosocial Re-Evaluation:   Psychosocial Discharge (Final Psychosocial  Re-Evaluation):   Education: Education Goals: Education classes will be provided on a weekly basis, covering required topics. Participant will state understanding/return demonstration of topics presented.  Learning  Barriers/Preferences: Learning Barriers/Preferences - 07/08/17 1445      Learning Barriers/Preferences   Learning Barriers  Sight;Reading wears reading glasses    Learning Preferences  None       Education Topics:  Initial Evaluation Education: - Verbal, written and demonstration of respiratory meds, oximetry and breathing techniques. Instruction on use of nebulizers and MDIs and importance of monitoring MDI activations.   Pulmonary Rehab from 07/08/2017 in [facility] Cardiac and Pulmonary Rehab  Date  07/08/17  Educator  [facility]  Instruction Review Code  1- Verbalizes Understanding      General Nutrition Guidelines/Fats and Fiber: -Group instruction provided by verbal, written material, models and posters to present the general guidelines for heart healthy nutrition. Gives an explanation and review of dietary fats and fiber.   Controlling Sodium/Reading Food Labels: -Group verbal and written material supporting the discussion of sodium use in heart healthy nutrition. Review and explanation with models, verbal and written materials for utilization of the food label.   Exercise Physiology & General Exercise Guidelines: - Group verbal and written instruction with models to review the exercise physiology of the cardiovascular system and associated critical values. Provides general exercise guidelines with specific guidelines to those with heart or lung disease.    Aerobic Exercise & Resistance Training: - Gives group verbal and written instruction on the various components of exercise. Focuses on aerobic and resistive training programs and the benefits of this training and how to safely progress through these programs.   Flexibility, Balance, Mind/Body Relaxation: Provides  group verbal/written instruction on the benefits of flexibility and balance training, including mind/body exercise modes such as yoga, pilates and tai chi.  Demonstration and skill practice provided.   Stress and Anxiety: - Provides group verbal and written instruction about the health risks of elevated stress and causes of high stress.  Discuss the correlation between heart/lung disease and anxiety and treatment options. Review healthy ways to manage with stress and anxiety.   Depression: - Provides group verbal and written instruction on the correlation between heart/lung disease and depressed mood, treatment options, and the stigmas associated with seeking treatment.   Exercise & Equipment Safety: - Individual verbal instruction and demonstration of equipment use and safety with use of the equipment.   Pulmonary Rehab from 07/08/2017 in [facility] Cardiac and Pulmonary Rehab  Date  07/08/17  Educator  [facility]  Instruction Review Code  1- Verbalizes Understanding      Infection Prevention: - Provides verbal and written material to individual with discussion of infection control including proper hand washing and proper equipment cleaning during exercise session.   Pulmonary Rehab from 07/08/2017 in [facility] Cardiac and Pulmonary Rehab  Date  07/08/17  Educator  [facility]  Instruction Review Code  1- Verbalizes Understanding      Falls Prevention: - Provides verbal and written material to individual with discussion of falls prevention and safety.   Pulmonary Rehab from 07/08/2017 in [facility] Cardiac and Pulmonary Rehab  Date  07/08/17  Educator  [facility]  Instruction Review Code  1- Verbalizes Understanding      Diabetes: - Individual verbal and written instruction to review signs/symptoms of diabetes, desired ranges of glucose level fasting, after meals and with exercise. Advice that pre and post exercise glucose checks will be done for 3 sessions at entry of program.   Chronic Lung Diseases: - Group verbal  and written instruction to review updates, respiratory medications, advancements in procedures and treatments. Discuss use of supplemental oxygen including available portable oxygen systems,  continuous and intermittent flow rates, concentrators, personal use and safety guidelines. Review proper use of inhaler and spacers. Provide informative websites for self-education.    Energy Conservation: - Provide group verbal and written instruction for methods to conserve energy, plan and organize activities. Instruct on pacing techniques, use of adaptive equipment and posture/positioning to relieve shortness of breath.   Triggers and Exacerbations: - Group verbal and written instruction to review types of environmental triggers and ways to prevent exacerbations. Discuss weather changes, air quality and the benefits of nasal washing. Review warning signs and symptoms to help prevent infections. Discuss techniques for effective airway clearance, coughing, and vibrations.   AED/CPR: - Group verbal and written instruction with the use of models to demonstrate the basic use of the AED with the basic ABC's of resuscitation.   Anatomy and Physiology of the Lungs: - Group verbal and written instruction with the use of models to provide basic lung anatomy and physiology related to function, structure and complications of lung disease.   Anatomy & Physiology of the Heart: - Group verbal and written instruction and models provide basic cardiac anatomy and physiology, with the coronary electrical and arterial systems. Review of Valvular disease and Heart Failure   Cardiac Medications: - Group verbal and written instruction to review commonly prescribed medications for heart disease. Reviews the medication, class of the drug, and side effects.   Know Your Numbers and Risk Factors: -Group verbal and written instruction about important numbers in your health.  Discussion of what are risk factors and how they  play a role in the disease process.  Review of Cholesterol, Blood Pressure, Diabetes, and BMI and the role they play in your overall health.   Sleep Hygiene: -Provides group verbal and written instruction about how sleep can affect your health.  Define sleep hygiene, discuss sleep cycles and impact of sleep habits. Review good sleep hygiene tips.    Other: -Provides group and verbal instruction on various topics (see comments)    Knowledge Questionnaire Score: Knowledge Questionnaire Score - 07/08/17 1500      Knowledge Questionnaire Score   Pre Score  9/18 reviewed with patient        Core Components/Risk Factors/Patient Goals at Admission: Personal Goals and Risk Factors at Admission - 07/08/17 1456      Core Components/Risk Factors/Patient Goals on Admission    Weight Management  Yes;Weight Loss;Obesity    Intervention  Weight Management: Develop a combined nutrition and exercise program designed to reach desired caloric intake, while maintaining appropriate intake of nutrient and fiber, sodium and fats, and appropriate energy expenditure required for the weight goal.;Weight Management: Provide education and appropriate resources to help participant work on and attain dietary goals.;Weight Management/Obesity: Establish reasonable short term and long term weight goals.    Admit Weight  249 lb 6.4 oz (113.1 kg)    Goal Weight: Short Term  244 lb (110.7 kg)    Goal Weight: Long Term  160 lb (72.6 kg)    Expected Outcomes  Short Term: Continue to assess and modify interventions until short term weight is achieved;Long Term: Adherence to nutrition and physical activity/exercise program aimed toward attainment of established weight goal;Weight Maintenance: Understanding of the daily nutrition guidelines, which includes 25-35% calories from fat, 7% or less cal from saturated fats, less than 269m cholesterol, less than 1.5gm of sodium, & 5 or more servings of fruits and vegetables  daily;Weight Loss: Understanding of general recommendations for a balanced deficit meal plan, which promotes  1-2 lb weight loss per week and includes a negative energy balance of [phone] kcal/d;Understanding recommendations for meals to include 15-35% energy as protein, 25-35% energy from fat, 35-60% energy from carbohydrates, less than 246m of dietary cholesterol, 20-35 gm of total fiber daily;Understanding of distribution of calorie intake throughout the day with the consumption of 4-5 meals/snacks    Tobacco Cessation  Yes    Number of packs per day  .25 pack/day    Intervention  Assist the participant in steps to quit. Provide individualized education and counseling about committing to Tobacco Cessation, relapse prevention, and pharmacological support that can be provided by physician.;OAdvice worker assist with locating and accessing local/national Quit Smoking programs, and support quit date choice.    Expected Outcomes  Short Term: Will demonstrate readiness to quit, by selecting a quit date.;Long Term: Complete abstinence from all tobacco products for at least 12 months from quit date.;Short Term: Will quit all tobacco product use, adhering to prevention of relapse plan.    Improve shortness of breath with ADL's  Yes    Intervention  Provide education, individualized exercise plan and daily activity instruction to help decrease symptoms of SOB with activities of daily living.    Expected Outcomes  Short Term: Improve cardiorespiratory fitness to achieve a reduction of symptoms when performing ADLs;Long Term: Be able to perform more ADLs without symptoms or delay the onset of symptoms    Diabetes  Yes    Intervention  Provide education about signs/symptoms and action to take for hypo/hyperglycemia.;Provide education about proper nutrition, including hydration, and aerobic/resistive exercise prescription along with prescribed medications to achieve blood glucose in normal ranges:  Fasting glucose 65-99 mg/dL    Expected Outcomes  Short Term: Participant verbalizes understanding of the signs/symptoms and immediate care of hyper/hypoglycemia, proper foot care and importance of medication, aerobic/resistive exercise and nutrition plan for blood glucose control.;Long Term: Attainment of HbA1C < 7%.    Hypertension  Yes    Intervention  Provide education on lifestyle modifcations including regular physical activity/exercise, weight management, moderate sodium restriction and increased consumption of fresh fruit, vegetables, and low fat dairy, alcohol moderation, and smoking cessation.;Monitor prescription use compliance.    Expected Outcomes  Short Term: Continued assessment and intervention until BP is < 140/938mHG in hypertensive participants. < 130/8047mG in hypertensive participants with diabetes, heart failure or chronic kidney disease.;Long Term: Maintenance of blood pressure at goal levels.       Core Components/Risk Factors/Patient Goals Review:    Core Components/Risk Factors/Patient Goals at Discharge (Final Review):    ITP Comments: ITP Comments    Row Name 07/08/17 1424           ITP Comments  Medical Evaluation completed. Chart sent for review and changes to [place] [facility] [place]          Comments: Initial ITP

## 2017-07-16 ENCOUNTER — Telehealth: Payer: Self-pay

## 2017-07-16 DIAGNOSIS — J849 Interstitial pulmonary disease, unspecified: Secondary | ICD-10-CM

## 2017-07-16 NOTE — Telephone Encounter (Signed)
[name] called to reschedule her start day to 07/23/17. Informed her that exercise is Monday, Wednesday and Friday.

## 2017-07-23 ENCOUNTER — Encounter: Payer: Medicaid Other | Admitting: *Deleted

## 2017-07-23 DIAGNOSIS — J849 Interstitial pulmonary disease, unspecified: Secondary | ICD-10-CM

## 2017-07-23 LAB — GLUCOSE, CAPILLARY
Glucose-Capillary: 101 mg/dL — ABNORMAL HIGH (ref 65–99)
Glucose-Capillary: 100 mg/dL — ABNORMAL HIGH (ref 65–99)

## 2017-07-23 NOTE — Progress Notes (Signed)
Daily Session Note  Patient Details  Name: [name] MRN: [id number] Date of Birth: 1966/04/11 Referring Provider:     Pulmonary Rehab from 07/08/2017 in [facility]  Referring Provider  Nelda Bucks MD       Encounter Date: 07/23/2017  Check In: Session Check In - 07/23/17 1136      Check-In   Location  [facility]    Staff Present  Renita Papa, RN BSN;Jessica Luan Pulling, MA, RCEP, CCRP, Exercise Physiologist;Joseph Flavia Shipper    Supervising physician immediately available to respond to emergencies  [facility] immediately available ER MD    Physician(s)  Dr. Mariea Clonts and Jimmye Norman     Medication changes reported      No    Fall or balance concerns reported     No    Tobacco Cessation  No Change    Warm-up and Cool-down  Performed as group-led instruction    Resistance Training Performed  Yes    VAD Patient?  No      Pain Assessment   Currently in Pain?  No/denies          Social History   Tobacco Use  Smoking Status Current Every Day Smoker  . Packs/day: 0.25  . Years: 36.00  . Pack years: 9.00  . Types: Cigarettes  Smokeless Tobacco Never Used    Goals Met:  Proper associated with RPD/PD & O2 Sat Independence with exercise equipment Using PLB without cueing & demonstrates good technique Exercise tolerated well Strength training completed today  Goals Unmet:  Not Applicable  Comments: First full day of exercise!  Patient was oriented to gym and equipment including functions, settings, policies, and procedures.  Patient's individual exercise prescription and treatment plan were reviewed.  All starting workloads were established based on the results of the 6 minute walk test done at initial orientation visit.  The plan for exercise progression was also introduced and progression will be customized based on patient's performance and goals.    Dr. Emily Filbert is Medical Director for [facility] and [facility].

## 2017-07-28 ENCOUNTER — Encounter: Payer: Medicaid Other | Attending: Pulmonary Disease

## 2017-07-28 DIAGNOSIS — E119 Type 2 diabetes mellitus without complications: Secondary | ICD-10-CM | POA: Insufficient documentation

## 2017-07-28 DIAGNOSIS — Z79899 Other long term (current) drug therapy: Secondary | ICD-10-CM | POA: Insufficient documentation

## 2017-07-28 DIAGNOSIS — J849 Interstitial pulmonary disease, unspecified: Secondary | ICD-10-CM | POA: Insufficient documentation

## 2017-07-28 DIAGNOSIS — F17210 Nicotine dependence, cigarettes, uncomplicated: Secondary | ICD-10-CM | POA: Insufficient documentation

## 2017-07-28 DIAGNOSIS — Z794 Long term (current) use of insulin: Secondary | ICD-10-CM | POA: Insufficient documentation

## 2017-07-28 DIAGNOSIS — I10 Essential (primary) hypertension: Secondary | ICD-10-CM | POA: Insufficient documentation

## 2017-08-04 DIAGNOSIS — J849 Interstitial pulmonary disease, unspecified: Secondary | ICD-10-CM

## 2017-08-04 NOTE — Progress Notes (Signed)
Pulmonary Individual Treatment Plan  Patient Details  Name: SHAWNTEE MAINWARING MRN: [id number] Date of Birth: [birth year] Referring Provider:     Pulmonary Rehab from 07/08/2017 in [facility] Pulmonary Rehab  Referring Provider  Nelda Bucks MD       Initial Encounter Date:    Pulmonary Rehab from 07/08/2017 in [facility] Cardiac and Pulmonary Rehab  Date  07/08/17  Referring Provider  Nelda Bucks MD       Visit Diagnosis: ILD (interstitial lung disease) ([place])  Patient's Home Medications on Admission:  Current Outpatient Medications:  .  albuterol (PROVENTIL HFA;VENTOLIN HFA) 108 (90 Base) MCG/ACT inhaler, Inhale into the lungs., Disp: , Rfl:  .  ALPRAZolam (XANAX) 1 MG tablet, Take 1 mg by mouth 3 (three) times daily., Disp: , Rfl:  .  carvedilol (COREG) 3.125 MG tablet, Take 3.125 mg by mouth., Disp: , Rfl:  .  enalapril (VASOTEC) 20 MG tablet, Take 20 mg by mouth., Disp: , Rfl:  .  enoxaparin (LOVENOX) 40 MG/0.4ML injection, Inject 0.4 mLs (40 mg total) into the skin every 12 (twelve) hours., Disp: 60 Syringe, Rfl: 0 .  exenatide (BYETTA) 5 MCG/0.02ML SOPN injection, Inject into the skin., Disp: , Rfl:  .  fluticasone (FLONASE) 50 MCG/ACT nasal spray, Place 2 sprays into both nostrils daily., Disp: , Rfl:  .  Fluticasone-Salmeterol (ADVAIR DISKUS) 250-50 MCG/DOSE AEPB, INHALE 1 PUFF BY MOUTH TWICE A DAY FOR COPD, Disp: , Rfl:  .  furosemide (LASIX) 20 MG tablet, TAKE 1 TABLET BY MOUTH EVERY MORNING FOR FLUID RETENTION X 3 DAYS, Disp: , Rfl: 2 .  insulin detemir (LEVEMIR) 100 UNIT/ML injection, Inject 10-20 Units into the skin 2 (two) times daily. 20 units in morning and 10 units in evening (may add additional 20 units in evening depending on blood sugar), Disp: , Rfl:  .  lidocaine (XYLOCAINE) 5 % ointment, APPLY 1 APPLICATION TOPICALLY TWO (2) TIMES A DAY. APPLY 5 GRAMS TO AREA UP TO TWICE DAILY, Disp: , Rfl: 3 .  losartan (COZAAR) 25 MG tablet, Take 25 mg by mouth  daily., Disp: , Rfl:  .  oxyCODONE (OXY IR/ROXICODONE) 5 MG immediate release tablet, Take 1-2 tablets (5-10 mg total) by mouth every 3 (three) hours as needed for breakthrough pain., Disp: 60 tablet, Rfl: 0 .  pravastatin (PRAVACHOL) 40 MG tablet, Take 40 mg by mouth at bedtime., Disp: , Rfl:  .  tiotropium (SPIRIVA) 18 MCG inhalation capsule, Place 18 mcg into inhaler and inhale daily., Disp: , Rfl:   Past Medical History: Past Medical History:  Diagnosis Date  . Bleeding disorder ([place])   . Diabetes mellitus without complication ([place])   . Hepatitis C, acute   . Hypertension   . Sleep apnea     Tobacco Use: Social History   Tobacco Use  Smoking Status Current Every Day Smoker  . Packs/day: 0.25  . Years: 36.00  . Pack years: 9.00  . Types: Cigarettes  Smokeless Tobacco Never Used    Labs: Recent Review Flowsheet Data    Labs for ITP Cardiac and Pulmonary Rehab Latest Ref Rng & Units 04/09/2014   Hemoglobin A1c 4.2 - 6.3 % 7.1(H)       Pulmonary Assessment Scores: Pulmonary Assessment Scores    Row Name 07/08/17 1444         ADL UCSD   ADL Phase  Entry     SOB Score total  103     Rest  3  Walk  5     Stairs  5     Bath  4     Dress  5     Shop  5       CAT Score   CAT Score  37       mMRC Score   mMRC Score  2        Pulmonary Function Assessment: Pulmonary Function Assessment - 07/08/17 1458      Breath   Bilateral Breath Sounds  Clear    Shortness of Breath  Limiting activity;Yes;Fear of Shortness of Breath       Exercise Target Goals:    Exercise Program Goal: Individual exercise prescription set using results from initial 6 min walk test and THRR while considering  patient's activity barriers and safety.    Exercise Prescription Goal: Initial exercise prescription builds to 30-45 minutes a day of aerobic activity, 2-3 days per week.  Home exercise guidelines will be given to patient during program as part of exercise prescription  that the participant will acknowledge.  Activity Barriers & Risk Stratification: Activity Barriers & Cardiac Risk Stratification - 07/08/17 1554      Activity Barriers & Cardiac Risk Stratification   Activity Barriers  Deconditioning;Muscular Weakness;Back Problems;Shortness of Breath;Other (comment)    Comments  Back tightens/spasms with walking, plate in l leg,  upper body weakness       6 Minute Walk: 6 Minute Walk    Row Name 07/08/17 1552         6 Minute Walk   Phase  Initial     Distance  1020 feet     Walk Time  6 minutes     # of Rest Breaks  0     MPH  1.93     METS  2.84     RPE  14     Perceived Dyspnea   2     VO2 Peak  9.93     Symptoms  Yes (comment)     Comments  back tight at end of test     Resting HR  99 bpm     Resting BP  126/62     Resting Oxygen Saturation   97 %     Exercise Oxygen Saturation  during 6 min walk  92 %     Max Ex. HR  125 bpm     Max Ex. BP  156/74     2 Minute Post BP  132/70       Interval HR   1 Minute HR  118     2 Minute HR  123     3 Minute HR  117     4 Minute HR  118     5 Minute HR  120     6 Minute HR  125     2 Minute Post HR  105     Interval Heart Rate?  Yes       Interval Oxygen   Interval Oxygen?  Yes     Baseline Oxygen Saturation %  97 %     1 Minute Oxygen Saturation %  94 %     1 Minute Liters of Oxygen  2 L continuous     2 Minute Oxygen Saturation %  93 %     2 Minute Liters of Oxygen  2 L     3 Minute Oxygen Saturation %  96 %     3 Minute Liters  of Oxygen  2 L     4 Minute Oxygen Saturation %  92 %     4 Minute Liters of Oxygen  2 L     5 Minute Oxygen Saturation %  92 %     5 Minute Liters of Oxygen  2 L     6 Minute Oxygen Saturation %  94 %     6 Minute Liters of Oxygen  2 L     2 Minute Post Oxygen Saturation %  100 %     2 Minute Post Liters of Oxygen  2 L       Oxygen Initial Assessment: Oxygen Initial Assessment - 07/08/17 1455      Home Oxygen   Home Oxygen Device  Home  Concentrator;E-Tanks    Sleep Oxygen Prescription  CPAP    Liters per minute  2    Home Exercise Oxygen Prescription  Continuous    Liters per minute  2    Home at Rest Exercise Oxygen Prescription  Continuous    Liters per minute  2    Compliance with Home Oxygen Use  Yes      Initial 6 min Walk   Oxygen Used  Continuous    Liters per minute  2      Program Oxygen Prescription   Program Oxygen Prescription  Continuous    Liters per minute  2      Intervention   Short Term Goals  To learn and exhibit compliance with exercise, home and travel O2 prescription;To learn and understand importance of maintaining oxygen saturations>88%;To learn and demonstrate proper use of respiratory medications;To learn and demonstrate proper pursed lip breathing techniques or other breathing techniques.;To learn and understand importance of monitoring SPO2 with pulse oximeter and demonstrate accurate use of the pulse oximeter.    Long  Term Goals  Exhibits compliance with exercise, home and travel O2 prescription;Verbalizes importance of monitoring SPO2 with pulse oximeter and return demonstration;Maintenance of O2 saturations>88%;Exhibits proper breathing techniques, such as pursed lip breathing or other method taught during program session;Compliance with respiratory medication;Demonstrates proper use of MDI's       Oxygen Re-Evaluation: Oxygen Re-Evaluation    Row Name 07/23/17 1139             Goals/Expected Outcomes   Short Term Goals  To learn and exhibit compliance with exercise, home and travel O2 prescription;To learn and understand importance of monitoring SPO2 with pulse oximeter and demonstrate accurate use of the pulse oximeter.;To learn and understand importance of maintaining oxygen saturations>88%;To learn and demonstrate proper pursed lip breathing techniques or other breathing techniques.       Long  Term Goals  Exhibits compliance with exercise, home and travel O2  prescription;Verbalizes importance of monitoring SPO2 with pulse oximeter and return demonstration;Maintenance of O2 saturations>88%;Exhibits proper breathing techniques, such as pursed lip breathing or other method taught during program session       Comments  Reviewed PLB technique with pt.  Talked about how it work and it's important to maintaining his exercise saturations.         Goals/Expected Outcomes  Short: Become more profiecient at using PLB.   Long: Become independent at using PLB.          Oxygen Discharge (Final Oxygen Re-Evaluation): Oxygen Re-Evaluation - 07/23/17 1139      Goals/Expected Outcomes   Short Term Goals  To learn and exhibit compliance with exercise, home and travel O2 prescription;To learn and understand  importance of monitoring SPO2 with pulse oximeter and demonstrate accurate use of the pulse oximeter.;To learn and understand importance of maintaining oxygen saturations>88%;To learn and demonstrate proper pursed lip breathing techniques or other breathing techniques.    Long  Term Goals  Exhibits compliance with exercise, home and travel O2 prescription;Verbalizes importance of monitoring SPO2 with pulse oximeter and return demonstration;Maintenance of O2 saturations>88%;Exhibits proper breathing techniques, such as pursed lip breathing or other method taught during program session    Comments  Reviewed PLB technique with pt.  Talked about how it work and it's important to maintaining his exercise saturations.      Goals/Expected Outcomes  Short: Become more profiecient at using PLB.   Long: Become independent at using PLB.       Initial Exercise Prescription: Initial Exercise Prescription - 07/08/17 1500      Date of Initial Exercise RX and Referring Provider   Date  07/08/17    Referring Provider  Nelda Bucks MD       Oxygen   Oxygen  Continuous    Liters  2      Treadmill   MPH  1.9    Grade  1.5    Minutes  15    METs  2.91      REL-XR    Level  2    Speed  50    Minutes  15    METs  2      T5 Nustep   Level  2    SPM  80    Minutes  15    METs  2      Prescription Details   Frequency (times per week)  3    Duration  Progress to 45 minutes of aerobic exercise without signs/symptoms of physical distress      Intensity   THRR 40-80% of Max Heartrate  127-154    Ratings of Perceived Exertion  11-13    Perceived Dyspnea  0-4      Progression   Progression  Continue to progress workloads to maintain intensity without signs/symptoms of physical distress.      Resistance Training   Training Prescription  Yes    Weight  3 lbs    Reps  10-15       Perform Capillary Blood Glucose checks as needed.  Exercise Prescription Changes: Exercise Prescription Changes    Row Name 07/08/17 1500 07/23/17 1400           Response to Exercise   Blood Pressure (Admit)  126/62  144/72      Blood Pressure (Exercise)  156/74  146/84      Blood Pressure (Exit)  132/70  128/70      Heart Rate (Admit)  99 bpm  94 bpm      Heart Rate (Exercise)  125 bpm  110 bpm      Heart Rate (Exit)  105 bpm  94 bpm      Oxygen Saturation (Admit)  97 %  100 %      Oxygen Saturation (Exercise)  92 %  90 %      Oxygen Saturation (Exit)  100 %  98 %      Rating of Perceived Exertion (Exercise)  14  14      Perceived Dyspnea (Exercise)  2  2      Symptoms  back tightened at end  none      Comments  walk test results  first full day of  exercise      Duration  -  Progress to 45 minutes of aerobic exercise without signs/symptoms of physical distress      Intensity  -  THRR unchanged        Progression   Progression  -  Continue to progress workloads to maintain intensity without signs/symptoms of physical distress.      Average METs  -  1.8        Resistance Training   Training Prescription  -  Yes      Weight  -  3 lbs      Reps  -  10-15        Interval Training   Interval Training  -  No        Oxygen   Oxygen  -  Continuous       Liters  -  2        Treadmill   MPH  -  0.7      Grade  -  0      Minutes  -  15      METs  -  1.5        T5 Nustep   Level  -  2      SPM  -  76      Minutes  -  15      METs  -  2.1         Exercise Comments: Exercise Comments    Row Name 07/23/17 1138           Exercise Comments  First full day of exercise!  Patient was oriented to gym and equipment including functions, settings, policies, and procedures.  Patient's individual exercise prescription and treatment plan were reviewed.  All starting workloads were established based on the results of the 6 minute walk test done at initial orientation visit.  The plan for exercise progression was also introduced and progression will be customized based on patient's performance and goals.          Exercise Goals and Review: Exercise Goals    Row Name 07/08/17 1559             Exercise Goals   Increase Physical Activity  Yes       Intervention  Provide advice, education, support and counseling about physical activity/exercise needs.;Develop an individualized exercise prescription for aerobic and resistive training based on initial evaluation findings, risk stratification, comorbidities and participant's personal goals.       Expected Outcomes  Short Term: Attend rehab on a regular basis to increase amount of physical activity.;Long Term: Add in home exercise to make exercise part of routine and to increase amount of physical activity.;Long Term: Exercising regularly at least 3-5 days a week.       Increase Strength and Stamina  Yes       Intervention  Provide advice, education, support and counseling about physical activity/exercise needs.;Develop an individualized exercise prescription for aerobic and resistive training based on initial evaluation findings, risk stratification, comorbidities and participant's personal goals.       Expected Outcomes  Short Term: Increase workloads from initial exercise prescription for resistance,  speed, and METs.;Short Term: Perform resistance training exercises routinely during rehab and add in resistance training at home;Long Term: Improve cardiorespiratory fitness, muscular endurance and strength as measured by increased METs and functional capacity (6MWT)       Able to understand and use rate of perceived exertion (RPE) scale  Yes       Intervention  Provide education and explanation on how to use RPE scale       Expected Outcomes  Short Term: Able to use RPE daily in rehab to express subjective intensity level;Long Term:  Able to use RPE to guide intensity level when exercising independently       Able to understand and use Dyspnea scale  Yes       Intervention  Provide education and explanation on how to use Dyspnea scale       Expected Outcomes  Short Term: Able to use Dyspnea scale daily in rehab to express subjective sense of shortness of breath during exertion;Long Term: Able to use Dyspnea scale to guide intensity level when exercising independently       Knowledge and understanding of Target Heart Rate Range (THRR)  Yes       Intervention  Provide education and explanation of THRR including how the numbers were predicted and where they are located for reference       Expected Outcomes  Short Term: Able to state/look up THRR;Long Term: Able to use THRR to govern intensity when exercising independently;Short Term: Able to use daily as guideline for intensity in rehab       Able to check pulse independently  Yes       Intervention  Provide education and demonstration on how to check pulse in carotid and radial arteries.;Review the importance of being able to check your own pulse for safety during independent exercise       Expected Outcomes  Short Term: Able to explain why pulse checking is important during independent exercise;Long Term: Able to check pulse independently and accurately       Understanding of Exercise Prescription  Yes       Intervention  Provide education,  explanation, and written materials on patient's individual exercise prescription       Expected Outcomes  Short Term: Able to explain program exercise prescription;Long Term: Able to explain home exercise prescription to exercise independently          Exercise Goals Re-Evaluation : Exercise Goals Re-Evaluation    Row Name 07/23/17 1138             Exercise Goal Re-Evaluation   Exercise Goals Review  Increase Physical Activity;Increase Strength and Stamina;Understanding of Exercise Prescription;Able to understand and use rate of perceived exertion (RPE) scale;Able to understand and use Dyspnea scale       Comments  Reviewed RPE scale, THR and program prescription with pt today.  Pt voiced understanding and was given a copy of goals to take home.        Expected Outcomes  Short: Use RPE daily to regulate intensity.  Long: Follow program prescription in THR.          Discharge Exercise Prescription (Final Exercise Prescription Changes): Exercise Prescription Changes - 07/23/17 1400      Response to Exercise   Blood Pressure (Admit)  144/72    Blood Pressure (Exercise)  146/84    Blood Pressure (Exit)  128/70    Heart Rate (Admit)  94 bpm    Heart Rate (Exercise)  110 bpm    Heart Rate (Exit)  94 bpm    Oxygen Saturation (Admit)  100 %    Oxygen Saturation (Exercise)  90 %    Oxygen Saturation (Exit)  98 %    Rating of Perceived Exertion (Exercise)  14    Perceived Dyspnea (Exercise)  2    Symptoms  none    Comments  first full day of exercise    Duration  Progress to 45 minutes of aerobic exercise without signs/symptoms of physical distress    Intensity  THRR unchanged      Progression   Progression  Continue to progress workloads to maintain intensity without signs/symptoms of physical distress.    Average METs  1.8      Resistance Training   Training Prescription  Yes    Weight  3 lbs    Reps  10-15      Interval Training   Interval Training  No      Oxygen    Oxygen  Continuous    Liters  2      Treadmill   MPH  0.7    Grade  0    Minutes  15    METs  1.5      T5 Nustep   Level  2    SPM  76    Minutes  15    METs  2.1       Nutrition:  Target Goals: Understanding of nutrition guidelines, daily intake of sodium <1524m, cholesterol <2064m calories 30% from fat and 7% or less from saturated fats, daily to have 5 or more servings of fruits and vegetables.  Biometrics: Pre Biometrics - 07/08/17 1559      Pre Biometrics   Height  5' 1.5" (1.562 m)    Weight  249 lb 6.4 oz (113.1 kg)    Waist Circumference  43 inches    Hip Circumference  50.5 inches    Waist to Hip Ratio  0.85 %    BMI (Calculated)  46.37        Nutrition Therapy Plan and Nutrition Goals: Nutrition Therapy & Goals - 07/08/17 1446      Personal Nutrition Goals   Comments  She would like to lose weight and eat healthier. She states her appitite has not been good.      Intervention Plan   Intervention  Nutrition handout(s) given to patient.;Prescribe, educate and counsel regarding individualized specific dietary modifications aiming towards targeted core components such as weight, hypertension, lipid management, diabetes, heart failure and other comorbidities.    Expected Outcomes  Short Term Goal: Understand basic principles of dietary content, such as calories, fat, sodium, cholesterol and nutrients.;Long Term Goal: Adherence to prescribed nutrition plan.       Nutrition Assessments: Nutrition Assessments - 07/08/17 1443      MEDFICTS Scores   Pre Score  -- patient did not fill out paperwork       Nutrition Goals Re-Evaluation:   Nutrition Goals Discharge (Final Nutrition Goals Re-Evaluation):   Psychosocial: Target Goals: Acknowledge presence or absence of significant depression and/or stress, maximize coping skills, provide positive support system. Participant is able to verbalize types and ability to use techniques and skills needed for reducing  stress and depression.   Initial Review & Psychosocial Screening: Initial Psych Review & Screening - 07/08/17 1452      Initial Review   Current issues with  Current Sleep Concerns;Current Stress Concerns    Source of Stress Concerns  Chronic Illness;Unable to perform yard/household activities;Unable to participate in former interests or hobbies;Retirement/disability    Comments  She had surgery on her leg and did not have full PT for her knee.      Family Dynamics   Good Support System?  Yes    Comments  Her  daughter, brother and husband are good sources for support.      Barriers   Psychosocial barriers to participate in program  The patient should benefit from training in stress management and relaxation.      Screening Interventions   Interventions  Program counselor consult;Provide feedback about the scores to participant;Encouraged to exercise;To provide support and resources with identified psychosocial needs    Expected Outcomes  Short Term goal: Utilizing psychosocial counselor, staff and physician to assist with identification of specific Stressors or current issues interfering with healing process. Setting desired goal for each stressor or current issue identified.;Long Term Goal: Stressors or current issues are controlled or eliminated.;Short Term goal: Identification and review with participant of any Quality of Life or Depression concerns found by scoring the questionnaire.;Long Term goal: The participant improves quality of Life and PHQ9 Scores as seen by post scores and/or verbalization of changes       Quality of Life Scores:  Scores of 19 and below usually indicate a poorer quality of life in these areas.  A difference of  2-3 points is a clinically meaningful difference.  A difference of 2-3 points in the total score of the Quality of Life Index has been associated with significant improvement in overall quality of life, self-image, physical symptoms, and general health in  studies assessing change in quality of life.  PHQ-9: Recent Review Flowsheet Data    Depression screen [facility] 2/9 07/08/2017   Decreased Interest 3   Down, Depressed, Hopeless 1   PHQ - 2 Score 4   Altered sleeping 1   Tired, decreased energy 1   Change in appetite 1   Feeling bad or failure about yourself  0   Trouble concentrating 0   Moving slowly or fidgety/restless 0   Suicidal thoughts 0   PHQ-9 Score 7   Difficult doing work/chores Very difficult     Interpretation of Total Score  Total Score Depression Severity:  1-4 = Minimal depression, 5-9 = Mild depression, 10-14 = Moderate depression, 15-19 = Moderately severe depression, 20-27 = Severe depression   Psychosocial Evaluation and Intervention:   Psychosocial Re-Evaluation:   Psychosocial Discharge (Final Psychosocial Re-Evaluation):   Education: Education Goals: Education classes will be provided on a weekly basis, covering required topics. Participant will state understanding/return demonstration of topics presented.  Learning Barriers/Preferences: Learning Barriers/Preferences - 07/08/17 1445      Learning Barriers/Preferences   Learning Barriers  Sight;Reading wears reading glasses    Learning Preferences  None       Education Topics:  Initial Evaluation Education: - Verbal, written and demonstration of respiratory meds, oximetry and breathing techniques. Instruction on use of nebulizers and MDIs and importance of monitoring MDI activations.   Pulmonary Rehab from 07/23/2017 in [facility] Cardiac and Pulmonary Rehab  Date  07/08/17  Educator  [facility]  Instruction Review Code  1- Verbalizes Understanding      General Nutrition Guidelines/Fats and Fiber: -Group instruction provided by verbal, written material, models and posters to present the general guidelines for heart healthy nutrition. Gives an explanation and review of dietary fats and fiber.   Controlling Sodium/Reading Food Labels: -Group verbal and  written material supporting the discussion of sodium use in heart healthy nutrition. Review and explanation with models, verbal and written materials for utilization of the food label.   Exercise Physiology & General Exercise Guidelines: - Group verbal and written instruction with models to review the exercise physiology of the cardiovascular system and associated critical values.  Provides general exercise guidelines with specific guidelines to those with heart or lung disease.    Aerobic Exercise & Resistance Training: - Gives group verbal and written instruction on the various components of exercise. Focuses on aerobic and resistive training programs and the benefits of this training and how to safely progress through these programs.   Flexibility, Balance, Mind/Body Relaxation: Provides group verbal/written instruction on the benefits of flexibility and balance training, including mind/body exercise modes such as yoga, pilates and tai chi.  Demonstration and skill practice provided.   Stress and Anxiety: - Provides group verbal and written instruction about the health risks of elevated stress and causes of high stress.  Discuss the correlation between heart/lung disease and anxiety and treatment options. Review healthy ways to manage with stress and anxiety.   Depression: - Provides group verbal and written instruction on the correlation between heart/lung disease and depressed mood, treatment options, and the stigmas associated with seeking treatment.   Exercise & Equipment Safety: - Individual verbal instruction and demonstration of equipment use and safety with use of the equipment.   Pulmonary Rehab from 07/23/2017 in [facility] Cardiac and Pulmonary Rehab  Date  07/08/17  Educator  [facility]  Instruction Review Code  1- Verbalizes Understanding      Infection Prevention: - Provides verbal and written material to individual with discussion of infection control including proper hand washing  and proper equipment cleaning during exercise session.   Pulmonary Rehab from 07/23/2017 in [facility] Cardiac and Pulmonary Rehab  Date  07/08/17  Educator  [facility]  Instruction Review Code  1- Verbalizes Understanding      Falls Prevention: - Provides verbal and written material to individual with discussion of falls prevention and safety.   Pulmonary Rehab from 07/23/2017 in [facility] Cardiac and Pulmonary Rehab  Date  07/08/17  Educator  [facility]  Instruction Review Code  1- Verbalizes Understanding      Diabetes: - Individual verbal and written instruction to review signs/symptoms of diabetes, desired ranges of glucose level fasting, after meals and with exercise. Advice that pre and post exercise glucose checks will be done for 3 sessions at entry of program.   Chronic Lung Diseases: - Group verbal and written instruction to review updates, respiratory medications, advancements in procedures and treatments. Discuss use of supplemental oxygen including available portable oxygen systems, continuous and intermittent flow rates, concentrators, personal use and safety guidelines. Review proper use of inhaler and spacers. Provide informative websites for self-education.    Pulmonary Rehab from 07/23/2017 in [facility] Cardiac and Pulmonary Rehab  Date  07/23/17  Educator  [facility]  Instruction Review Code  1- Verbalizes Understanding      Energy Conservation: - Provide group verbal and written instruction for methods to conserve energy, plan and organize activities. Instruct on pacing techniques, use of adaptive equipment and posture/positioning to relieve shortness of breath.   Triggers and Exacerbations: - Group verbal and written instruction to review types of environmental triggers and ways to prevent exacerbations. Discuss weather changes, air quality and the benefits of nasal washing. Review warning signs and symptoms to help prevent infections. Discuss techniques for effective airway clearance, coughing, and  vibrations.   AED/CPR: - Group verbal and written instruction with the use of models to demonstrate the basic use of the AED with the basic ABC's of resuscitation.   Anatomy and Physiology of the Lungs: - Group verbal and written instruction with the use of models to provide basic lung anatomy and physiology related to function,  structure and complications of lung disease.   Anatomy & Physiology of the Heart: - Group verbal and written instruction and models provide basic cardiac anatomy and physiology, with the coronary electrical and arterial systems. Review of Valvular disease and Heart Failure   Cardiac Medications: - Group verbal and written instruction to review commonly prescribed medications for heart disease. Reviews the medication, class of the drug, and side effects.   Know Your Numbers and Risk Factors: -Group verbal and written instruction about important numbers in your health.  Discussion of what are risk factors and how they play a role in the disease process.  Review of Cholesterol, Blood Pressure, Diabetes, and BMI and the role they play in your overall health.   Sleep Hygiene: -Provides group verbal and written instruction about how sleep can affect your health.  Define sleep hygiene, discuss sleep cycles and impact of sleep habits. Review good sleep hygiene tips.    Other: -Provides group and verbal instruction on various topics (see comments)    Knowledge Questionnaire Score: Knowledge Questionnaire Score - 07/08/17 1500      Knowledge Questionnaire Score   Pre Score  9/18 reviewed with patient        Core Components/Risk Factors/Patient Goals at Admission: Personal Goals and Risk Factors at Admission - 07/08/17 1456      Core Components/Risk Factors/Patient Goals on Admission    Weight Management  Yes;Weight Loss;Obesity    Intervention  Weight Management: Develop a combined nutrition and exercise program designed to reach desired caloric intake,  while maintaining appropriate intake of nutrient and fiber, sodium and fats, and appropriate energy expenditure required for the weight goal.;Weight Management: Provide education and appropriate resources to help participant work on and attain dietary goals.;Weight Management/Obesity: Establish reasonable short term and long term weight goals.    Admit Weight  249 lb 6.4 oz (113.1 kg)    Goal Weight: Short Term  244 lb (110.7 kg)    Goal Weight: Long Term  160 lb (72.6 kg)    Expected Outcomes  Short Term: Continue to assess and modify interventions until short term weight is achieved;Long Term: Adherence to nutrition and physical activity/exercise program aimed toward attainment of established weight goal;Weight Maintenance: Understanding of the daily nutrition guidelines, which includes 25-35% calories from fat, 7% or less cal from saturated fats, less than 235m cholesterol, less than 1.5gm of sodium, & 5 or more servings of fruits and vegetables daily;Weight Loss: Understanding of general recommendations for a balanced deficit meal plan, which promotes 1-2 lb weight loss per week and includes a negative energy balance of [phone] kcal/d;Understanding recommendations for meals to include 15-35% energy as protein, 25-35% energy from fat, 35-60% energy from carbohydrates, less than 2063mof dietary cholesterol, 20-35 gm of total fiber daily;Understanding of distribution of calorie intake throughout the day with the consumption of 4-5 meals/snacks    Tobacco Cessation  Yes    Number of packs per day  .25 pack/day    Intervention  Assist the participant in steps to quit. Provide individualized education and counseling about committing to Tobacco Cessation, relapse prevention, and pharmacological support that can be provided by physician.;OfAdvice workerassist with locating and accessing local/national Quit Smoking programs, and support quit date choice.    Expected Outcomes  Short Term: Will  demonstrate readiness to quit, by selecting a quit date.;Long Term: Complete abstinence from all tobacco products for at least 12 months from quit date.;Short Term: Will quit all tobacco product use, adhering to  prevention of relapse plan.    Improve shortness of breath with ADL's  Yes    Intervention  Provide education, individualized exercise plan and daily activity instruction to help decrease symptoms of SOB with activities of daily living.    Expected Outcomes  Short Term: Improve cardiorespiratory fitness to achieve a reduction of symptoms when performing ADLs;Long Term: Be able to perform more ADLs without symptoms or delay the onset of symptoms    Diabetes  Yes    Intervention  Provide education about signs/symptoms and action to take for hypo/hyperglycemia.;Provide education about proper nutrition, including hydration, and aerobic/resistive exercise prescription along with prescribed medications to achieve blood glucose in normal ranges: Fasting glucose 65-99 mg/dL    Expected Outcomes  Short Term: Participant verbalizes understanding of the signs/symptoms and immediate care of hyper/hypoglycemia, proper foot care and importance of medication, aerobic/resistive exercise and nutrition plan for blood glucose control.;Long Term: Attainment of HbA1C < 7%.    Hypertension  Yes    Intervention  Provide education on lifestyle modifcations including regular physical activity/exercise, weight management, moderate sodium restriction and increased consumption of fresh fruit, vegetables, and low fat dairy, alcohol moderation, and smoking cessation.;Monitor prescription use compliance.    Expected Outcomes  Short Term: Continued assessment and intervention until BP is < 140/20m HG in hypertensive participants. < 130/870mHG in hypertensive participants with diabetes, heart failure or chronic kidney disease.;Long Term: Maintenance of blood pressure at goal levels.       Core Components/Risk Factors/Patient  Goals Review:    Core Components/Risk Factors/Patient Goals at Discharge (Final Review):    ITP Comments: ITP Comments    Row Name 07/08/17 1424 07/16/17 1402 08/04/17 0905       ITP Comments  Medical Evaluation completed. Chart sent for review and changes to Dr. MaEmily Filbertirector of [facility]  TeEvelenaalled to reschedule her start day to 07/23/17. Informed her that exercise is Monday, Wednesday and Friday.   30 day review completed. ITP sent to Dr. MaEmily Filbertirector of LuKennethContinue with ITP unless changes are made by physician        Comments: 30 day review

## 2017-08-05 ENCOUNTER — Telehealth: Payer: Self-pay

## 2017-08-05 DIAGNOSIS — J849 Interstitial pulmonary disease, unspecified: Secondary | ICD-10-CM

## 2017-08-05 NOTE — Telephone Encounter (Signed)
Left message for patient call back. Patient has not attended since 07/23/17. Patient called back and stated she was having car trouble and will be back on 08/11/17.

## 2017-08-12 ENCOUNTER — Telehealth: Payer: Self-pay

## 2017-08-12 DIAGNOSIS — J849 Interstitial pulmonary disease, unspecified: Secondary | ICD-10-CM

## 2017-08-12 NOTE — Telephone Encounter (Signed)
Called [name] to check up on her. She states she has been sick and has a lot going on right not. Informed her that she has not been in [facility] since 07/23/17. Informed her that if she cannot attend at this time that she can be discharged and if she would like to return she needs another referral from her doctor. Patient verbalizes and understands. Patient discharged today.

## 2017-08-12 NOTE — Progress Notes (Signed)
Discharge Progress Report  Patient Details  Name: [name] MRN: [id number] Date of Birth: [birth year] Referring Provider:     [facility]  Referring Provider  Normajean GlasgowVigeland, Christine MD        Number of Visits: 2/36  Reason for Discharge:  Early Exit:  Personal and Lack of attendance  Smoking History:  Social History   Tobacco Use  Smoking Status Current Every Day Smoker  . Packs/day: 0.25  . Years: 36.00  . Pack years: 9.00  . Types: Cigarettes  Smokeless Tobacco Never Used    Diagnosis:  ILD (interstitial lung disease) (HCC)  ADL UCSD: Pulmonary Assessment Scores    Row Name 07/08/17 1444         ADL UCSD   ADL Phase  Entry     SOB Score total  103     Rest  3     Walk  5     Stairs  5     Bath  4     Dress  5     Shop  5       CAT Score   CAT Score  37       mMRC Score   mMRC Score  2        Initial Exercise Prescription: Initial Exercise Prescription - 07/08/17 1500      Date of Initial Exercise RX and Referring Provider   Date  07/08/17    Referring Provider  Normajean GlasgowVigeland, Christine MD       Oxygen   Oxygen  Continuous    Liters  2      Treadmill   MPH  1.9    Grade  1.5    Minutes  15    METs  2.91      REL-XR   Level  2    Speed  50    Minutes  15    METs  2      T5 Nustep   Level  2    SPM  80    Minutes  15    METs  2      Prescription Details   Frequency (times per week)  3    Duration  Progress to 45 minutes of aerobic exercise without signs/symptoms of physical distress      Intensity   THRR 40-80% of Max Heartrate  127-154    Ratings of Perceived Exertion  11-13    Perceived Dyspnea  0-4      Progression   Progression  Continue to progress workloads to maintain intensity without signs/symptoms of physical distress.      Resistance Training   Training Prescription  Yes    Weight  3 lbs    Reps  10-15       Discharge Exercise Prescription (Final  Exercise Prescription Changes): Exercise Prescription Changes - 07/23/17 1400      Response to Exercise   Blood Pressure (Admit)  144/72    Blood Pressure (Exercise)  146/84    Blood Pressure (Exit)  128/70    Heart Rate (Admit)  94 bpm    Heart Rate (Exercise)  110 bpm    Heart Rate (Exit)  94 bpm    Oxygen Saturation (Admit)  100 %    Oxygen Saturation (Exercise)  90 %    Oxygen Saturation (Exit)  98 %    Rating of Perceived Exertion (Exercise)  14    Perceived  Dyspnea (Exercise)  2    Symptoms  none    Comments  first full day of exercise    Duration  Progress to 45 minutes of aerobic exercise without signs/symptoms of physical distress    Intensity  THRR unchanged      Progression   Progression  Continue to progress workloads to maintain intensity without signs/symptoms of physical distress.    Average METs  1.8      Resistance Training   Training Prescription  Yes    Weight  3 lbs    Reps  10-15      Interval Training   Interval Training  No      Oxygen   Oxygen  Continuous    Liters  2      Treadmill   MPH  0.7    Grade  0    Minutes  15    METs  1.5      T5 Nustep   Level  2    SPM  76    Minutes  15    METs  2.1       Functional Capacity: 6 Minute Walk    Row Name 07/08/17 1552         6 Minute Walk   Phase  Initial     Distance  1020 feet     Walk Time  6 minutes     # of Rest Breaks  0     MPH  1.93     METS  2.84     RPE  14     Perceived Dyspnea   2     VO2 Peak  9.93     Symptoms  Yes (comment)     Comments  back tight at end of test     Resting HR  99 bpm     Resting BP  126/62     Resting Oxygen Saturation   97 %     Exercise Oxygen Saturation  during 6 min walk  92 %     Max Ex. HR  125 bpm     Max Ex. BP  156/74     2 Minute Post BP  132/70       Interval HR   1 Minute HR  118     2 Minute HR  123     3 Minute HR  117     4 Minute HR  118     5 Minute HR  120     6 Minute HR  125     2 Minute Post HR  105      Interval Heart Rate?  Yes       Interval Oxygen   Interval Oxygen?  Yes     Baseline Oxygen Saturation %  97 %     1 Minute Oxygen Saturation %  94 %     1 Minute Liters of Oxygen  2 L continuous     2 Minute Oxygen Saturation %  93 %     2 Minute Liters of Oxygen  2 L     3 Minute Oxygen Saturation %  96 %     3 Minute Liters of Oxygen  2 L     4 Minute Oxygen Saturation %  92 %     4 Minute Liters of Oxygen  2 L     5 Minute Oxygen Saturation %  92 %     5 Minute Liters  of Oxygen  2 L     6 Minute Oxygen Saturation %  94 %     6 Minute Liters of Oxygen  2 L     2 Minute Post Oxygen Saturation %  100 %     2 Minute Post Liters of Oxygen  2 L        Psychological, QOL, Others - Outcomes: PHQ 2/9: Depression screen PHQ 2/9 07/08/2017  Decreased Interest 3  Down, Depressed, Hopeless 1  PHQ - 2 Score 4  Altered sleeping 1  Tired, decreased energy 1  Change in appetite 1  Feeling bad or failure about yourself  0  Trouble concentrating 0  Moving slowly or fidgety/restless 0  Suicidal thoughts 0  PHQ-9 Score 7  Difficult doing work/chores Very difficult    Quality of Life:   Personal Goals: Goals established at orientation with interventions provided to work toward goal. Personal Goals and Risk Factors at Admission - 07/08/17 1456      Core Components/Risk Factors/Patient Goals on Admission    Weight Management  Yes;Weight Loss;Obesity    Intervention  Weight Management: Develop a combined nutrition and exercise program designed to reach desired caloric intake, while maintaining appropriate intake of nutrient and fiber, sodium and fats, and appropriate energy expenditure required for the weight goal.;Weight Management: Provide education and appropriate resources to help participant work on and attain dietary goals.;Weight Management/Obesity: Establish reasonable short term and long term weight goals.    Admit Weight  249 lb 6.4 oz (113.1 kg)    Goal Weight: Short Term  244  lb (110.7 kg)    Goal Weight: Long Term  160 lb (72.6 kg)    Expected Outcomes  Short Term: Continue to assess and modify interventions until short term weight is achieved;Long Term: Adherence to nutrition and physical activity/exercise program aimed toward attainment of established weight goal;Weight Maintenance: Understanding of the daily nutrition guidelines, which includes 25-35% calories from fat, 7% or less cal from saturated fats, less than 200mg  cholesterol, less than 1.5gm of sodium, & 5 or more servings of fruits and vegetables daily;Weight Loss: Understanding of general recommendations for a balanced deficit meal plan, which promotes 1-2 lb weight loss per week and includes a negative energy balance of [phone] kcal/d;Understanding recommendations for meals to include 15-35% energy as protein, 25-35% energy from fat, 35-60% energy from carbohydrates, less than 200mg  of dietary cholesterol, 20-35 gm of total fiber daily;Understanding of distribution of calorie intake throughout the day with the consumption of 4-5 meals/snacks    Tobacco Cessation  Yes    Number of packs per day  .25 pack/day    Intervention  Assist the participant in steps to quit. Provide individualized education and counseling about committing to Tobacco Cessation, relapse prevention, and pharmacological support that can be provided by physician.;Education officer, environmental, assist with locating and accessing local/national Quit Smoking programs, and support quit date choice.    Expected Outcomes  Short Term: Will demonstrate readiness to quit, by selecting a quit date.;Long Term: Complete abstinence from all tobacco products for at least 12 months from quit date.;Short Term: Will quit all tobacco product use, adhering to prevention of relapse plan.    Improve shortness of breath with ADL's  Yes    Intervention  Provide education, individualized exercise plan and daily activity instruction to help decrease symptoms of SOB with  activities of daily living.    Expected Outcomes  Short Term: Improve cardiorespiratory fitness to achieve a reduction of symptoms  when performing ADLs;Long Term: Be able to perform more ADLs without symptoms or delay the onset of symptoms    Diabetes  Yes    Intervention  Provide education about signs/symptoms and action to take for hypo/hyperglycemia.;Provide education about proper nutrition, including hydration, and aerobic/resistive exercise prescription along with prescribed medications to achieve blood glucose in normal ranges: Fasting glucose 65-99 mg/dL    Expected Outcomes  Short Term: Participant verbalizes understanding of the signs/symptoms and immediate care of hyper/hypoglycemia, proper foot care and importance of medication, aerobic/resistive exercise and nutrition plan for blood glucose control.;Long Term: Attainment of HbA1C < 7%.    Hypertension  Yes    Intervention  Provide education on lifestyle modifcations including regular physical activity/exercise, weight management, moderate sodium restriction and increased consumption of fresh fruit, vegetables, and low fat dairy, alcohol moderation, and smoking cessation.;Monitor prescription use compliance.    Expected Outcomes  Short Term: Continued assessment and intervention until BP is < 140/37mm HG in hypertensive participants. < 130/75mm HG in hypertensive participants with diabetes, heart failure or chronic kidney disease.;Long Term: Maintenance of blood pressure at goal levels.        Personal Goals Discharge:   Exercise Goals and Review: Exercise Goals    Row Name 07/08/17 1559             Exercise Goals   Increase Physical Activity  Yes       Intervention  Provide advice, education, support and counseling about physical activity/exercise needs.;Develop an individualized exercise prescription for aerobic and resistive training based on initial evaluation findings, risk stratification, comorbidities and participant's  personal goals.       Expected Outcomes  Short Term: Attend rehab on a regular basis to increase amount of physical activity.;Long Term: Add in home exercise to make exercise part of routine and to increase amount of physical activity.;Long Term: Exercising regularly at least 3-5 days a week.       Increase Strength and Stamina  Yes       Intervention  Provide advice, education, support and counseling about physical activity/exercise needs.;Develop an individualized exercise prescription for aerobic and resistive training based on initial evaluation findings, risk stratification, comorbidities and participant's personal goals.       Expected Outcomes  Short Term: Increase workloads from initial exercise prescription for resistance, speed, and METs.;Short Term: Perform resistance training exercises routinely during rehab and add in resistance training at home;Long Term: Improve cardiorespiratory fitness, muscular endurance and strength as measured by increased METs and functional capacity ( )       Able to understand and use rate of perceived exertion (RPE) scale  Yes       Intervention  Provide education and explanation on how to use RPE scale       Expected Outcomes  Short Term: Able to use RPE daily in rehab to express subjective intensity level;Long Term:  Able to use RPE to guide intensity level when exercising independently       Able to understand and use Dyspnea scale  Yes       Intervention  Provide education and explanation on how to use Dyspnea scale       Expected Outcomes  Short Term: Able to use Dyspnea scale daily in rehab to express subjective sense of shortness of breath during exertion;Long Term: Able to use Dyspnea scale to guide intensity level when exercising independently       Knowledge and understanding of Target Heart Rate Range (THRR)  Yes  Intervention  Provide education and explanation of THRR including how the numbers were predicted and where they are located for  reference       Expected Outcomes  Short Term: Able to state/look up THRR;Long Term: Able to use THRR to govern intensity when exercising independently;Short Term: Able to use daily as guideline for intensity in rehab       Able to check pulse independently  Yes       Intervention  Provide education and demonstration on how to check pulse in carotid and radial arteries.;Review the importance of being able to check your own pulse for safety during independent exercise       Expected Outcomes  Short Term: Able to explain why pulse checking is important during independent exercise;Long Term: Able to check pulse independently and accurately       Understanding of Exercise Prescription  Yes       Intervention  Provide education, explanation, and written materials on patient's individual exercise prescription       Expected Outcomes  Short Term: Able to explain program exercise prescription;Long Term: Able to explain home exercise prescription to exercise independently          Nutrition & Weight - Outcomes: Pre Biometrics - 07/08/17 1559      Pre Biometrics   Height  5' 1.5" (1.562 m)    Weight  249 lb 6.4 oz (113.1 kg)    Waist Circumference  43 inches    Hip Circumference  50.5 inches    Waist to Hip Ratio  0.85 %    BMI (Calculated)  46.37        Nutrition: Nutrition Therapy & Goals - 07/08/17 1446      Personal Nutrition Goals   Comments  She would like to lose weight and eat healthier. She states her appitite has not been good.      Intervention Plan   Intervention  Nutrition handout(s) given to patient.;Prescribe, educate and counsel regarding individualized specific dietary modifications aiming towards targeted core components such as weight, hypertension, lipid management, diabetes, heart failure and other comorbidities.    Expected Outcomes  Short Term Goal: Understand basic principles of dietary content, such as calories, fat, sodium, cholesterol and nutrients.;Long Term Goal:  Adherence to prescribed nutrition plan.       Nutrition Discharge: Nutrition Assessments - 07/08/17 1443      MEDFICTS Scores   Pre Score  -- patient did not fill out paperwork       Education Questionnaire Score: Knowledge Questionnaire Score - 07/08/17 1500      Knowledge Questionnaire Score   Pre Score  9/18 reviewed with patient       Goals reviewed with patient; copy given to patient.

## 2017-08-12 NOTE — Progress Notes (Signed)
Pulmonary Individual Treatment Plan  Patient Details  Name: [name] MRN: [id number] Date of Birth: [birth year] Referring Provider:     Pulmonary Rehab from 07/08/2017 in [facility] Cardiac and Pulmonary Rehab  Referring Provider  Nelda Bucks MD       Initial Encounter Date:    Pulmonary Rehab from 07/08/2017 in [facility] Cardiac and Pulmonary Rehab  Date  07/08/17  Referring Provider  Nelda Bucks MD       Visit Diagnosis: ILD (interstitial lung disease) ([place])  Patient's Home Medications on Admission:  Current Outpatient Medications:  .  albuterol (PROVENTIL HFA;VENTOLIN HFA) 108 (90 Base) MCG/ACT inhaler, Inhale into the lungs., Disp: , Rfl:  .  ALPRAZolam (XANAX) 1 MG tablet, Take 1 mg by mouth 3 (three) times daily., Disp: , Rfl:  .  carvedilol (COREG) 3.125 MG tablet, Take 3.125 mg by mouth., Disp: , Rfl:  .  enalapril (VASOTEC) 20 MG tablet, Take 20 mg by mouth., Disp: , Rfl:  .  enoxaparin (LOVENOX) 40 MG/0.4ML injection, Inject 0.4 mLs (40 mg total) into the skin every 12 (twelve) hours., Disp: 60 Syringe, Rfl: 0 .  exenatide (BYETTA) 5 MCG/0.02ML SOPN injection, Inject into the skin., Disp: , Rfl:  .  fluticasone (FLONASE) 50 MCG/ACT nasal spray, Place 2 sprays into both nostrils daily., Disp: , Rfl:  .  Fluticasone-Salmeterol (ADVAIR DISKUS) 250-50 MCG/DOSE AEPB, INHALE 1 PUFF BY MOUTH TWICE A DAY FOR COPD, Disp: , Rfl:  .  furosemide (LASIX) 20 MG tablet, TAKE 1 TABLET BY MOUTH EVERY MORNING FOR FLUID RETENTION X 3 DAYS, Disp: , Rfl: 2 .  insulin detemir (LEVEMIR) 100 UNIT/ML injection, Inject 10-20 Units into the skin 2 (two) times daily. 20 units in morning and 10 units in evening (may add additional 20 units in evening depending on blood sugar), Disp: , Rfl:  .  lidocaine (XYLOCAINE) 5 % ointment, APPLY 1 APPLICATION TOPICALLY TWO (2) TIMES A DAY. APPLY 5 GRAMS TO AREA UP TO TWICE DAILY, Disp: , Rfl: 3 .  losartan (COZAAR) 25 MG tablet, Take 25 mg by mouth  daily., Disp: , Rfl:  .  oxyCODONE (OXY IR/ROXICODONE) 5 MG immediate release tablet, Take 1-2 tablets (5-10 mg total) by mouth every 3 (three) hours as needed for breakthrough pain., Disp: 60 tablet, Rfl: 0 .  pravastatin (PRAVACHOL) 40 MG tablet, Take 40 mg by mouth at bedtime., Disp: , Rfl:  .  tiotropium (SPIRIVA) 18 MCG inhalation capsule, Place 18 mcg into inhaler and inhale daily., Disp: , Rfl:   Past Medical History: Past Medical History:  Diagnosis Date  . Bleeding disorder ([place])   . Diabetes mellitus without complication ([place])   . Hepatitis C, acute   . Hypertension   . Sleep apnea     Tobacco Use: Social History   Tobacco Use  Smoking Status Current Every Day Smoker  . Packs/day: 0.25  . Years: 36.00  . Pack years: 9.00  . Types: Cigarettes  Smokeless Tobacco Never Used    Labs: Recent Review Flowsheet Data    Labs for ITP Cardiac and Pulmonary Rehab Latest Ref Rng & Units 04/09/2014   Hemoglobin A1c 4.2 - 6.3 % 7.1(H)       Pulmonary Assessment Scores: Pulmonary Assessment Scores    Row Name 07/08/17 1444         ADL UCSD   ADL Phase  Entry     SOB Score total  103     Rest  3  Walk  5     Stairs  5     Bath  4     Dress  5     Shop  5       CAT Score   CAT Score  37       mMRC Score   mMRC Score  2        Pulmonary Function Assessment: Pulmonary Function Assessment - 07/08/17 1458      Breath   Bilateral Breath Sounds  Clear    Shortness of Breath  Limiting activity;Yes;Fear of Shortness of Breath       Exercise Target Goals:    Exercise Program Goal: Individual exercise prescription set using results from initial 6 min walk test and THRR while considering  patient's activity barriers and safety.    Exercise Prescription Goal: Initial exercise prescription builds to 30-45 minutes a day of aerobic activity, 2-3 days per week.  Home exercise guidelines will be given to patient during program as part of exercise prescription  that the participant will acknowledge.  Activity Barriers & Risk Stratification: Activity Barriers & Cardiac Risk Stratification - 07/08/17 1554      Activity Barriers & Cardiac Risk Stratification   Activity Barriers  Deconditioning;Muscular Weakness;Back Problems;Shortness of Breath;Other (comment)    Comments  Back tightens/spasms with walking, plate in l leg,  upper body weakness       6 Minute Walk: 6 Minute Walk    Row Name 07/08/17 1552         6 Minute Walk   Phase  Initial     Distance  1020 feet     Walk Time  6 minutes     # of Rest Breaks  0     MPH  1.93     METS  2.84     RPE  14     Perceived Dyspnea   2     VO2 Peak  9.93     Symptoms  Yes (comment)     Comments  back tight at end of test     Resting HR  99 bpm     Resting BP  126/62     Resting Oxygen Saturation   97 %     Exercise Oxygen Saturation  during 6 min walk  92 %     Max Ex. HR  125 bpm     Max Ex. BP  156/74     2 Minute Post BP  132/70       Interval HR   1 Minute HR  118     2 Minute HR  123     3 Minute HR  117     4 Minute HR  118     5 Minute HR  120     6 Minute HR  125     2 Minute Post HR  105     Interval Heart Rate?  Yes       Interval Oxygen   Interval Oxygen?  Yes     Baseline Oxygen Saturation %  97 %     1 Minute Oxygen Saturation %  94 %     1 Minute Liters of Oxygen  2 L continuous     2 Minute Oxygen Saturation %  93 %     2 Minute Liters of Oxygen  2 L     3 Minute Oxygen Saturation %  96 %     3 Minute Liters  of Oxygen  2 L     4 Minute Oxygen Saturation %  92 %     4 Minute Liters of Oxygen  2 L     5 Minute Oxygen Saturation %  92 %     5 Minute Liters of Oxygen  2 L     6 Minute Oxygen Saturation %  94 %     6 Minute Liters of Oxygen  2 L     2 Minute Post Oxygen Saturation %  100 %     2 Minute Post Liters of Oxygen  2 L       Oxygen Initial Assessment: Oxygen Initial Assessment - 07/08/17 1455      Home Oxygen   Home Oxygen Device  Home  Concentrator;E-Tanks    Sleep Oxygen Prescription  CPAP    Liters per minute  2    Home Exercise Oxygen Prescription  Continuous    Liters per minute  2    Home at Rest Exercise Oxygen Prescription  Continuous    Liters per minute  2    Compliance with Home Oxygen Use  Yes      Initial 6 min Walk   Oxygen Used  Continuous    Liters per minute  2      Program Oxygen Prescription   Program Oxygen Prescription  Continuous    Liters per minute  2      Intervention   Short Term Goals  To learn and exhibit compliance with exercise, home and travel O2 prescription;To learn and understand importance of maintaining oxygen saturations>88%;To learn and demonstrate proper use of respiratory medications;To learn and demonstrate proper pursed lip breathing techniques or other breathing techniques.;To learn and understand importance of monitoring SPO2 with pulse oximeter and demonstrate accurate use of the pulse oximeter.    Long  Term Goals  Exhibits compliance with exercise, home and travel O2 prescription;Verbalizes importance of monitoring SPO2 with pulse oximeter and return demonstration;Maintenance of O2 saturations>88%;Exhibits proper breathing techniques, such as pursed lip breathing or other method taught during program session;Compliance with respiratory medication;Demonstrates proper use of MDI's       Oxygen Re-Evaluation: Oxygen Re-Evaluation    Row Name 07/23/17 1139             Goals/Expected Outcomes   Short Term Goals  To learn and exhibit compliance with exercise, home and travel O2 prescription;To learn and understand importance of monitoring SPO2 with pulse oximeter and demonstrate accurate use of the pulse oximeter.;To learn and understand importance of maintaining oxygen saturations>88%;To learn and demonstrate proper pursed lip breathing techniques or other breathing techniques.       Long  Term Goals  Exhibits compliance with exercise, home and travel O2  prescription;Verbalizes importance of monitoring SPO2 with pulse oximeter and return demonstration;Maintenance of O2 saturations>88%;Exhibits proper breathing techniques, such as pursed lip breathing or other method taught during program session       Comments  Reviewed PLB technique with pt.  Talked about how it work and it's important to maintaining his exercise saturations.         Goals/Expected Outcomes  Short: Become more profiecient at using PLB.   Long: Become independent at using PLB.          Oxygen Discharge (Final Oxygen Re-Evaluation): Oxygen Re-Evaluation - 07/23/17 1139      Goals/Expected Outcomes   Short Term Goals  To learn and exhibit compliance with exercise, home and travel O2 prescription;To learn and understand  importance of monitoring SPO2 with pulse oximeter and demonstrate accurate use of the pulse oximeter.;To learn and understand importance of maintaining oxygen saturations>88%;To learn and demonstrate proper pursed lip breathing techniques or other breathing techniques.    Long  Term Goals  Exhibits compliance with exercise, home and travel O2 prescription;Verbalizes importance of monitoring SPO2 with pulse oximeter and return demonstration;Maintenance of O2 saturations>88%;Exhibits proper breathing techniques, such as pursed lip breathing or other method taught during program session    Comments  Reviewed PLB technique with pt.  Talked about how it work and it's important to maintaining his exercise saturations.      Goals/Expected Outcomes  Short: Become more profiecient at using PLB.   Long: Become independent at using PLB.       Initial Exercise Prescription: Initial Exercise Prescription - 07/08/17 1500      Date of Initial Exercise RX and Referring Provider   Date  07/08/17    Referring Provider  Nelda Bucks MD       Oxygen   Oxygen  Continuous    Liters  2      Treadmill   MPH  1.9    Grade  1.5    Minutes  15    METs  2.91      REL-XR    Level  2    Speed  50    Minutes  15    METs  2      T5 Nustep   Level  2    SPM  80    Minutes  15    METs  2      Prescription Details   Frequency (times per week)  3    Duration  Progress to 45 minutes of aerobic exercise without signs/symptoms of physical distress      Intensity   THRR 40-80% of Max Heartrate  127-154    Ratings of Perceived Exertion  11-13    Perceived Dyspnea  0-4      Progression   Progression  Continue to progress workloads to maintain intensity without signs/symptoms of physical distress.      Resistance Training   Training Prescription  Yes    Weight  3 lbs    Reps  10-15       Perform Capillary Blood Glucose checks as needed.  Exercise Prescription Changes: Exercise Prescription Changes    Row Name 07/08/17 1500 07/23/17 1400           Response to Exercise   Blood Pressure (Admit)  126/62  144/72      Blood Pressure (Exercise)  156/74  146/84      Blood Pressure (Exit)  132/70  128/70      Heart Rate (Admit)  99 bpm  94 bpm      Heart Rate (Exercise)  125 bpm  110 bpm      Heart Rate (Exit)  105 bpm  94 bpm      Oxygen Saturation (Admit)  97 %  100 %      Oxygen Saturation (Exercise)  92 %  90 %      Oxygen Saturation (Exit)  100 %  98 %      Rating of Perceived Exertion (Exercise)  14  14      Perceived Dyspnea (Exercise)  2  2      Symptoms  back tightened at end  none      Comments  walk test results  first full day of  exercise      Duration  -  Progress to 45 minutes of aerobic exercise without signs/symptoms of physical distress      Intensity  -  THRR unchanged        Progression   Progression  -  Continue to progress workloads to maintain intensity without signs/symptoms of physical distress.      Average METs  -  1.8        Resistance Training   Training Prescription  -  Yes      Weight  -  3 lbs      Reps  -  10-15        Interval Training   Interval Training  -  No        Oxygen   Oxygen  -  Continuous       Liters  -  2        Treadmill   MPH  -  0.7      Grade  -  0      Minutes  -  15      METs  -  1.5        T5 Nustep   Level  -  2      SPM  -  76      Minutes  -  15      METs  -  2.1         Exercise Comments: Exercise Comments    Row Name 07/23/17 1138           Exercise Comments  First full day of exercise!  Patient was oriented to gym and equipment including functions, settings, policies, and procedures.  Patient's individual exercise prescription and treatment plan were reviewed.  All starting workloads were established based on the results of the 6 minute walk test done at initial orientation visit.  The plan for exercise progression was also introduced and progression will be customized based on patient's performance and goals.          Exercise Goals and Review: Exercise Goals    Row Name 07/08/17 1559             Exercise Goals   Increase Physical Activity  Yes       Intervention  Provide advice, education, support and counseling about physical activity/exercise needs.;Develop an individualized exercise prescription for aerobic and resistive training based on initial evaluation findings, risk stratification, comorbidities and participant's personal goals.       Expected Outcomes  Short Term: Attend rehab on a regular basis to increase amount of physical activity.;Long Term: Add in home exercise to make exercise part of routine and to increase amount of physical activity.;Long Term: Exercising regularly at least 3-5 days a week.       Increase Strength and Stamina  Yes       Intervention  Provide advice, education, support and counseling about physical activity/exercise needs.;Develop an individualized exercise prescription for aerobic and resistive training based on initial evaluation findings, risk stratification, comorbidities and participant's personal goals.       Expected Outcomes  Short Term: Increase workloads from initial exercise prescription for resistance,  speed, and METs.;Short Term: Perform resistance training exercises routinely during rehab and add in resistance training at home;Long Term: Improve cardiorespiratory fitness, muscular endurance and strength as measured by increased METs and functional capacity (6MWT)       Able to understand and use rate of perceived exertion (RPE) scale  Yes       Intervention  Provide education and explanation on how to use RPE scale       Expected Outcomes  Short Term: Able to use RPE daily in rehab to express subjective intensity level;Long Term:  Able to use RPE to guide intensity level when exercising independently       Able to understand and use Dyspnea scale  Yes       Intervention  Provide education and explanation on how to use Dyspnea scale       Expected Outcomes  Short Term: Able to use Dyspnea scale daily in rehab to express subjective sense of shortness of breath during exertion;Long Term: Able to use Dyspnea scale to guide intensity level when exercising independently       Knowledge and understanding of Target Heart Rate Range (THRR)  Yes       Intervention  Provide education and explanation of THRR including how the numbers were predicted and where they are located for reference       Expected Outcomes  Short Term: Able to state/look up THRR;Long Term: Able to use THRR to govern intensity when exercising independently;Short Term: Able to use daily as guideline for intensity in rehab       Able to check pulse independently  Yes       Intervention  Provide education and demonstration on how to check pulse in carotid and radial arteries.;Review the importance of being able to check your own pulse for safety during independent exercise       Expected Outcomes  Short Term: Able to explain why pulse checking is important during independent exercise;Long Term: Able to check pulse independently and accurately       Understanding of Exercise Prescription  Yes       Intervention  Provide education,  explanation, and written materials on patient's individual exercise prescription       Expected Outcomes  Short Term: Able to explain program exercise prescription;Long Term: Able to explain home exercise prescription to exercise independently          Exercise Goals Re-Evaluation : Exercise Goals Re-Evaluation    Row Name 07/23/17 1138 08/04/17 1630           Exercise Goal Re-Evaluation   Exercise Goals Review  Increase Physical Activity;Increase Strength and Stamina;Understanding of Exercise Prescription;Able to understand and use rate of perceived exertion (RPE) scale;Able to understand and use Dyspnea scale  -      Comments  Reviewed RPE scale, THR and program prescription with pt today.  Pt voiced understanding and was given a copy of goals to take home.   out since last review      Expected Outcomes  Short: Use RPE daily to regulate intensity.  Long: Follow program prescription in [facility].  -         Discharge Exercise Prescription (Final Exercise Prescription Changes): Exercise Prescription Changes - 07/23/17 1400      Response to Exercise   Blood Pressure (Admit)  144/72    Blood Pressure (Exercise)  146/84    Blood Pressure (Exit)  128/70    Heart Rate (Admit)  94 bpm    Heart Rate (Exercise)  110 bpm    Heart Rate (Exit)  94 bpm    Oxygen Saturation (Admit)  100 %    Oxygen Saturation (Exercise)  90 %    Oxygen Saturation (Exit)  98 %    Rating of Perceived Exertion (Exercise)  14  Perceived Dyspnea (Exercise)  2    Symptoms  none    Comments  first full day of exercise    Duration  Progress to 45 minutes of aerobic exercise without signs/symptoms of physical distress    Intensity  THRR unchanged      Progression   Progression  Continue to progress workloads to maintain intensity without signs/symptoms of physical distress.    Average METs  1.8      Resistance Training   Training Prescription  Yes    Weight  3 lbs    Reps  10-15      Interval Training    Interval Training  No      Oxygen   Oxygen  Continuous    Liters  2      Treadmill   MPH  0.7    Grade  0    Minutes  15    METs  1.5      T5 Nustep   Level  2    SPM  76    Minutes  15    METs  2.1       Nutrition:  Target Goals: Understanding of nutrition guidelines, daily intake of sodium <1524m, cholesterol <2034m calories 30% from fat and 7% or less from saturated fats, daily to have 5 or more servings of fruits and vegetables.  Biometrics: Pre Biometrics - 07/08/17 1559      Pre Biometrics   Height  5' 1.5" (1.562 m)    Weight  249 lb 6.4 oz (113.1 kg)    Waist Circumference  43 inches    Hip Circumference  50.5 inches    Waist to Hip Ratio  0.85 %    BMI (Calculated)  46.37        Nutrition Therapy Plan and Nutrition Goals: Nutrition Therapy & Goals - 07/08/17 1446      Personal Nutrition Goals   Comments  She would like to lose weight and eat healthier. She states her appitite has not been good.      Intervention Plan   Intervention  Nutrition handout(s) given to patient.;Prescribe, educate and counsel regarding individualized specific dietary modifications aiming towards targeted core components such as weight, hypertension, lipid management, diabetes, heart failure and other comorbidities.    Expected Outcomes  Short Term Goal: Understand basic principles of dietary content, such as calories, fat, sodium, cholesterol and nutrients.;Long Term Goal: Adherence to prescribed nutrition plan.       Nutrition Assessments: Nutrition Assessments - 07/08/17 1443      MEDFICTS Scores   Pre Score  -- patient did not fill out paperwork       Nutrition Goals Re-Evaluation:   Nutrition Goals Discharge (Final Nutrition Goals Re-Evaluation):   Psychosocial: Target Goals: Acknowledge presence or absence of significant depression and/or stress, maximize coping skills, provide positive support system. Participant is able to verbalize types and ability to use  techniques and skills needed for reducing stress and depression.   Initial Review & Psychosocial Screening: Initial Psych Review & Screening - 07/08/17 1452      Initial Review   Current issues with  Current Sleep Concerns;Current Stress Concerns    Source of Stress Concerns  Chronic Illness;Unable to perform yard/household activities;Unable to participate in former interests or hobbies;Retirement/disability    Comments  She had surgery on her leg and did not have full PT for her knee.      Family Dynamics   Good Support System?  Yes  Comments  Her daughter, brother and husband are good sources for support.      Barriers   Psychosocial barriers to participate in program  The patient should benefit from training in stress management and relaxation.      Screening Interventions   Interventions  Program counselor consult;Provide feedback about the scores to participant;Encouraged to exercise;To provide support and resources with identified psychosocial needs    Expected Outcomes  Short Term goal: Utilizing psychosocial counselor, staff and physician to assist with identification of specific Stressors or current issues interfering with healing process. Setting desired goal for each stressor or current issue identified.;Long Term Goal: Stressors or current issues are controlled or eliminated.;Short Term goal: Identification and review with participant of any Quality of Life or Depression concerns found by scoring the questionnaire.;Long Term goal: The participant improves quality of Life and PHQ9 Scores as seen by post scores and/or verbalization of changes       Quality of Life Scores:  Scores of 19 and below usually indicate a poorer quality of life in these areas.  A difference of  2-3 points is a clinically meaningful difference.  A difference of 2-3 points in the total score of the Quality of Life Index has been associated with significant improvement in overall quality of life,  self-image, physical symptoms, and general health in studies assessing change in quality of life.  PHQ-9: Recent Review Flowsheet Data    Depression screen [facility] 2/9 07/08/2017   Decreased Interest 3   Down, Depressed, Hopeless 1   PHQ - 2 Score 4   Altered sleeping 1   Tired, decreased energy 1   Change in appetite 1   Feeling bad or failure about yourself  0   Trouble concentrating 0   Moving slowly or fidgety/restless 0   Suicidal thoughts 0   PHQ-9 Score 7   Difficult doing work/chores Very difficult     Interpretation of Total Score  Total Score Depression Severity:  1-4 = Minimal depression, 5-9 = Mild depression, 10-14 = Moderate depression, 15-19 = Moderately severe depression, 20-27 = Severe depression   Psychosocial Evaluation and Intervention:   Psychosocial Re-Evaluation:   Psychosocial Discharge (Final Psychosocial Re-Evaluation):   Education: Education Goals: Education classes will be provided on a weekly basis, covering required topics. Participant will state understanding/return demonstration of topics presented.  Learning Barriers/Preferences: Learning Barriers/Preferences - 07/08/17 1445      Learning Barriers/Preferences   Learning Barriers  Sight;Reading wears reading glasses    Learning Preferences  None       Education Topics:  Initial Evaluation Education: - Verbal, written and demonstration of respiratory meds, oximetry and breathing techniques. Instruction on use of nebulizers and MDIs and importance of monitoring MDI activations.   Pulmonary Rehab from 07/23/2017 in [facility] Cardiac and Pulmonary Rehab  Date  07/08/17  Educator  [facility]  Instruction Review Code  1- Verbalizes Understanding      General Nutrition Guidelines/Fats and Fiber: -Group instruction provided by verbal, written material, models and posters to present the general guidelines for heart healthy nutrition. Gives an explanation and review of dietary fats and  fiber.   Controlling Sodium/Reading Food Labels: -Group verbal and written material supporting the discussion of sodium use in heart healthy nutrition. Review and explanation with models, verbal and written materials for utilization of the food label.   Exercise Physiology & General Exercise Guidelines: - Group verbal and written instruction with models to review the exercise physiology of the cardiovascular system and  associated critical values. Provides general exercise guidelines with specific guidelines to those with heart or lung disease.    Aerobic Exercise & Resistance Training: - Gives group verbal and written instruction on the various components of exercise. Focuses on aerobic and resistive training programs and the benefits of this training and how to safely progress through these programs.   Flexibility, Balance, Mind/Body Relaxation: Provides group verbal/written instruction on the benefits of flexibility and balance training, including mind/body exercise modes such as yoga, pilates and tai chi.  Demonstration and skill practice provided.   Stress and Anxiety: - Provides group verbal and written instruction about the health risks of elevated stress and causes of high stress.  Discuss the correlation between heart/lung disease and anxiety and treatment options. Review healthy ways to manage with stress and anxiety.   Depression: - Provides group verbal and written instruction on the correlation between heart/lung disease and depressed mood, treatment options, and the stigmas associated with seeking treatment.   Exercise & Equipment Safety: - Individual verbal instruction and demonstration of equipment use and safety with use of the equipment.   Pulmonary Rehab from 07/23/2017 in [facility] Cardiac and Pulmonary Rehab  Date  07/08/17  Educator  [facility]  Instruction Review Code  1- Verbalizes Understanding      Infection Prevention: - Provides verbal and written material to  individual with discussion of infection control including proper hand washing and proper equipment cleaning during exercise session.   Pulmonary Rehab from 07/23/2017 in [facility] Cardiac and Pulmonary Rehab  Date  07/08/17  Educator  [facility]  Instruction Review Code  1- Verbalizes Understanding      Falls Prevention: - Provides verbal and written material to individual with discussion of falls prevention and safety.   Pulmonary Rehab from 07/23/2017 in [facility] Cardiac and Pulmonary Rehab  Date  07/08/17  Educator  [facility]  Instruction Review Code  1- Verbalizes Understanding      Diabetes: - Individual verbal and written instruction to review signs/symptoms of diabetes, desired ranges of glucose level fasting, after meals and with exercise. Advice that pre and post exercise glucose checks will be done for 3 sessions at entry of program.   Chronic Lung Diseases: - Group verbal and written instruction to review updates, respiratory medications, advancements in procedures and treatments. Discuss use of supplemental oxygen including available portable oxygen systems, continuous and intermittent flow rates, concentrators, personal use and safety guidelines. Review proper use of inhaler and spacers. Provide informative websites for self-education.    Pulmonary Rehab from 07/23/2017 in [facility] Cardiac and Pulmonary Rehab  Date  07/23/17  Educator  [facility]  Instruction Review Code  1- Verbalizes Understanding      Energy Conservation: - Provide group verbal and written instruction for methods to conserve energy, plan and organize activities. Instruct on pacing techniques, use of adaptive equipment and posture/positioning to relieve shortness of breath.   Triggers and Exacerbations: - Group verbal and written instruction to review types of environmental triggers and ways to prevent exacerbations. Discuss weather changes, air quality and the benefits of nasal washing. Review warning signs and symptoms to help prevent  infections. Discuss techniques for effective airway clearance, coughing, and vibrations.   AED/CPR: - Group verbal and written instruction with the use of models to demonstrate the basic use of the AED with the basic ABC's of resuscitation.   Anatomy and Physiology of the Lungs: - Group verbal and written instruction with the use of models to provide basic lung anatomy and physiology  related to function, structure and complications of lung disease.   Anatomy & Physiology of the Heart: - Group verbal and written instruction and models provide basic cardiac anatomy and physiology, with the coronary electrical and arterial systems. Review of Valvular disease and Heart Failure   Cardiac Medications: - Group verbal and written instruction to review commonly prescribed medications for heart disease. Reviews the medication, class of the drug, and side effects.   Know Your Numbers and Risk Factors: -Group verbal and written instruction about important numbers in your health.  Discussion of what are risk factors and how they play a role in the disease process.  Review of Cholesterol, Blood Pressure, Diabetes, and BMI and the role they play in your overall health.   Sleep Hygiene: -Provides group verbal and written instruction about how sleep can affect your health.  Define sleep hygiene, discuss sleep cycles and impact of sleep habits. Review good sleep hygiene tips.    Other: -Provides group and verbal instruction on various topics (see comments)    Knowledge Questionnaire Score: Knowledge Questionnaire Score - 07/08/17 1500      Knowledge Questionnaire Score   Pre Score  9/18 reviewed with patient        Core Components/Risk Factors/Patient Goals at Admission: Personal Goals and Risk Factors at Admission - 07/08/17 1456      Core Components/Risk Factors/Patient Goals on Admission    Weight Management  Yes;Weight Loss;Obesity    Intervention  Weight Management: Develop a combined  nutrition and exercise program designed to reach desired caloric intake, while maintaining appropriate intake of nutrient and fiber, sodium and fats, and appropriate energy expenditure required for the weight goal.;Weight Management: Provide education and appropriate resources to help participant work on and attain dietary goals.;Weight Management/Obesity: Establish reasonable short term and long term weight goals.    Admit Weight  249 lb 6.4 oz (113.1 kg)    Goal Weight: Short Term  244 lb (110.7 kg)    Goal Weight: Long Term  160 lb (72.6 kg)    Expected Outcomes  Short Term: Continue to assess and modify interventions until short term weight is achieved;Long Term: Adherence to nutrition and physical activity/exercise program aimed toward attainment of established weight goal;Weight Maintenance: Understanding of the daily nutrition guidelines, which includes 25-35% calories from fat, 7% or less cal from saturated fats, less than 257m cholesterol, less than 1.5gm of sodium, & 5 or more servings of fruits and vegetables daily;Weight Loss: Understanding of general recommendations for a balanced deficit meal plan, which promotes 1-2 lb weight loss per week and includes a negative energy balance of [phone] kcal/d;Understanding recommendations for meals to include 15-35% energy as protein, 25-35% energy from fat, 35-60% energy from carbohydrates, less than 2074mof dietary cholesterol, 20-35 gm of total fiber daily;Understanding of distribution of calorie intake throughout the day with the consumption of 4-5 meals/snacks    Tobacco Cessation  Yes    Number of packs per day  .25 pack/day    Intervention  Assist the participant in steps to quit. Provide individualized education and counseling about committing to Tobacco Cessation, relapse prevention, and pharmacological support that can be provided by physician.;OfAdvice workerassist with locating and accessing local/national Quit Smoking  programs, and support quit date choice.    Expected Outcomes  Short Term: Will demonstrate readiness to quit, by selecting a quit date.;Long Term: Complete abstinence from all tobacco products for at least 12 months from quit date.;Short Term: Will quit all tobacco product  use, adhering to prevention of relapse plan.    Improve shortness of breath with ADL's  Yes    Intervention  Provide education, individualized exercise plan and daily activity instruction to help decrease symptoms of SOB with activities of daily living.    Expected Outcomes  Short Term: Improve cardiorespiratory fitness to achieve a reduction of symptoms when performing ADLs;Long Term: Be able to perform more ADLs without symptoms or delay the onset of symptoms    Diabetes  Yes    Intervention  Provide education about signs/symptoms and action to take for hypo/hyperglycemia.;Provide education about proper nutrition, including hydration, and aerobic/resistive exercise prescription along with prescribed medications to achieve blood glucose in normal ranges: Fasting glucose 65-99 mg/dL    Expected Outcomes  Short Term: Participant verbalizes understanding of the signs/symptoms and immediate care of hyper/hypoglycemia, proper foot care and importance of medication, aerobic/resistive exercise and nutrition plan for blood glucose control.;Long Term: Attainment of HbA1C < 7%.    Hypertension  Yes    Intervention  Provide education on lifestyle modifcations including regular physical activity/exercise, weight management, moderate sodium restriction and increased consumption of fresh fruit, vegetables, and low fat dairy, alcohol moderation, and smoking cessation.;Monitor prescription use compliance.    Expected Outcomes  Short Term: Continued assessment and intervention until BP is < 140/39m HG in hypertensive participants. < 130/816mHG in hypertensive participants with diabetes, heart failure or chronic kidney disease.;Long Term: Maintenance  of blood pressure at goal levels.       Core Components/Risk Factors/Patient Goals Review:    Core Components/Risk Factors/Patient Goals at Discharge (Final Review):    ITP Comments: ITP Comments    Row Name 07/08/17 1424 07/16/17 1402 08/04/17 0905 08/04/17 1630 08/05/17 1640   ITP Comments  Medical Evaluation completed. Chart sent for review and changes to Dr. MaEmily Filbertirector of [facility] BendDiagnosis can be found in [facility]  TeNancyalled to reschedule her start day to 07/23/17. Informed her that exercise is Monday, Wednesday and Friday.   30 day review completed. ITP sent to Dr. MaEmily Filbertirector of LuHalfway HouseContinue with ITP unless changes are made by physician  out since last review  Left message for patient call back. Patient has not attended since 07/23/17. Patient called back and stated she was having car trouble and will be back on 08/11/17.   RoWestportame 08/12/17 1618 08/12/17 1619         ITP Comments  Called TeZilphao check up on her. She states she has been sick and has a lot going on right not. Informed her that she has not been in [place]23[place]17. Informed her that if she cannot attend at this time that she can be discharged and if she would like to return she needs another referral from her doctor. Patient verbalizes and understands. Patient discharged today.  Discharge ITP sent and signed by Dr. MiSabra Heck        Comments: Discharge ITP

## 2017-10-31 DIAGNOSIS — R0609 Other forms of dyspnea: Secondary | ICD-10-CM | POA: Insufficient documentation

## 2017-12-15 ENCOUNTER — Ambulatory Visit: Payer: Medicaid Other | Admitting: Podiatry

## 2018-01-19 ENCOUNTER — Ambulatory Visit: Payer: Medicaid Other | Admitting: Podiatry

## 2018-02-23 ENCOUNTER — Ambulatory Visit: Payer: Medicaid Other | Admitting: Podiatry

## 2019-11-04 ENCOUNTER — Inpatient Hospital Stay: Payer: Medicaid Other | Admitting: Anesthesiology

## 2019-11-04 ENCOUNTER — Other Ambulatory Visit: Payer: Self-pay

## 2019-11-04 ENCOUNTER — Inpatient Hospital Stay
Admission: EM | Admit: 2019-11-04 | Discharge: 2019-11-08 | DRG: 378 | Disposition: A | Discharge status: Home / Self Care | Payer: Medicaid Other | Attending: Internal Medicine | Admitting: Internal Medicine

## 2019-11-04 ENCOUNTER — Encounter
Admission: EM | Disposition: A | Discharge status: Home / Self Care | Payer: Self-pay | Source: Home / Self Care | Attending: Internal Medicine

## 2019-11-04 ENCOUNTER — Encounter: Payer: Self-pay | Admitting: Emergency Medicine

## 2019-11-04 ENCOUNTER — Emergency Department: Payer: Medicaid Other

## 2019-11-04 DIAGNOSIS — B182 Chronic viral hepatitis C: Secondary | ICD-10-CM | POA: Diagnosis present

## 2019-11-04 DIAGNOSIS — E869 Volume depletion, unspecified: Secondary | ICD-10-CM | POA: Diagnosis present

## 2019-11-04 DIAGNOSIS — G4733 Obstructive sleep apnea (adult) (pediatric): Secondary | ICD-10-CM | POA: Diagnosis present

## 2019-11-04 DIAGNOSIS — K250 Acute gastric ulcer with hemorrhage: Principal | ICD-10-CM

## 2019-11-04 DIAGNOSIS — K920 Hematemesis: Secondary | ICD-10-CM | POA: Diagnosis present

## 2019-11-04 DIAGNOSIS — M79605 Pain in left leg: Secondary | ICD-10-CM | POA: Diagnosis present

## 2019-11-04 DIAGNOSIS — R0789 Other chest pain: Secondary | ICD-10-CM

## 2019-11-04 DIAGNOSIS — Z7951 Long term (current) use of inhaled steroids: Secondary | ICD-10-CM | POA: Diagnosis not present

## 2019-11-04 DIAGNOSIS — F17210 Nicotine dependence, cigarettes, uncomplicated: Secondary | ICD-10-CM | POA: Diagnosis present

## 2019-11-04 DIAGNOSIS — M542 Cervicalgia: Secondary | ICD-10-CM | POA: Diagnosis present

## 2019-11-04 DIAGNOSIS — F1020 Alcohol dependence, uncomplicated: Secondary | ICD-10-CM | POA: Diagnosis present

## 2019-11-04 DIAGNOSIS — F1010 Alcohol abuse, uncomplicated: Secondary | ICD-10-CM | POA: Diagnosis present

## 2019-11-04 DIAGNOSIS — K5900 Constipation, unspecified: Secondary | ICD-10-CM | POA: Diagnosis present

## 2019-11-04 DIAGNOSIS — K7030 Alcoholic cirrhosis of liver without ascites: Secondary | ICD-10-CM | POA: Diagnosis present

## 2019-11-04 DIAGNOSIS — K31811 Angiodysplasia of stomach and duodenum with bleeding: Secondary | ICD-10-CM | POA: Diagnosis present

## 2019-11-04 DIAGNOSIS — Z79891 Long term (current) use of opiate analgesic: Secondary | ICD-10-CM

## 2019-11-04 DIAGNOSIS — I8510 Secondary esophageal varices without bleeding: Secondary | ICD-10-CM | POA: Diagnosis present

## 2019-11-04 DIAGNOSIS — I959 Hypotension, unspecified: Secondary | ICD-10-CM | POA: Diagnosis present

## 2019-11-04 DIAGNOSIS — J849 Interstitial pulmonary disease, unspecified: Secondary | ICD-10-CM | POA: Diagnosis present

## 2019-11-04 DIAGNOSIS — E119 Type 2 diabetes mellitus without complications: Secondary | ICD-10-CM

## 2019-11-04 DIAGNOSIS — K766 Portal hypertension: Secondary | ICD-10-CM | POA: Diagnosis present

## 2019-11-04 DIAGNOSIS — K922 Gastrointestinal hemorrhage, unspecified: Secondary | ICD-10-CM | POA: Diagnosis present

## 2019-11-04 DIAGNOSIS — F1029 Alcohol dependence with unspecified alcohol-induced disorder: Secondary | ICD-10-CM

## 2019-11-04 DIAGNOSIS — Z794 Long term (current) use of insulin: Secondary | ICD-10-CM | POA: Diagnosis not present

## 2019-11-04 DIAGNOSIS — Z79899 Other long term (current) drug therapy: Secondary | ICD-10-CM

## 2019-11-04 DIAGNOSIS — A09 Infectious gastroenteritis and colitis, unspecified: Secondary | ICD-10-CM | POA: Diagnosis present

## 2019-11-04 DIAGNOSIS — G8929 Other chronic pain: Secondary | ICD-10-CM | POA: Diagnosis not present

## 2019-11-04 DIAGNOSIS — Z9981 Dependence on supplemental oxygen: Secondary | ICD-10-CM

## 2019-11-04 DIAGNOSIS — K921 Melena: Secondary | ICD-10-CM | POA: Diagnosis not present

## 2019-11-04 DIAGNOSIS — D696 Thrombocytopenia, unspecified: Secondary | ICD-10-CM | POA: Diagnosis present

## 2019-11-04 DIAGNOSIS — I10 Essential (primary) hypertension: Secondary | ICD-10-CM | POA: Diagnosis present

## 2019-11-04 DIAGNOSIS — J9611 Chronic respiratory failure with hypoxia: Secondary | ICD-10-CM | POA: Diagnosis present

## 2019-11-04 DIAGNOSIS — Z7982 Long term (current) use of aspirin: Secondary | ICD-10-CM

## 2019-11-04 DIAGNOSIS — Z20822 Contact with and (suspected) exposure to covid-19: Secondary | ICD-10-CM | POA: Diagnosis present

## 2019-11-04 DIAGNOSIS — R195 Other fecal abnormalities: Secondary | ICD-10-CM | POA: Diagnosis present

## 2019-11-04 DIAGNOSIS — K2970 Gastritis, unspecified, without bleeding: Secondary | ICD-10-CM | POA: Diagnosis present

## 2019-11-04 HISTORY — DX: Gastrointestinal hemorrhage, unspecified: K92.2

## 2019-11-04 HISTORY — DX: Dependence on supplemental oxygen: Z99.81

## 2019-11-04 HISTORY — PX: ESOPHAGOGASTRODUODENOSCOPY (EGD) WITH PROPOFOL: SHX5813

## 2019-11-04 LAB — BASIC METABOLIC PANEL
Anion gap: 8 (ref 5–15)
BUN: 17 mg/dL (ref 6–20)
CO2: 26 mmol/L (ref 22–32)
Calcium: 8.1 mg/dL — ABNORMAL LOW (ref 8.9–10.3)
Chloride: 102 mmol/L (ref 98–111)
Creatinine, Ser: 1.21 mg/dL — ABNORMAL HIGH (ref 0.44–1.00)
GFR calc Af Amer: 59 mL/min — ABNORMAL LOW (ref >60)
GFR calc non Af Amer: 51 mL/min — ABNORMAL LOW (ref >60)
Glucose, Bld: 191 mg/dL — ABNORMAL HIGH (ref 70–99)
Potassium: 3.6 mmol/L (ref 3.5–5.1)
Sodium: 136 mmol/L (ref 135–145)

## 2019-11-04 LAB — BLOOD GAS, VENOUS
Acid-Base Excess: 1.5 mmol/L (ref 0.0–2.0)
Bicarbonate: 29.2 mmol/L — ABNORMAL HIGH (ref 20.0–28.0)
O2 Saturation: 82.8 %
Patient temperature: 37
pCO2, Ven: 58 mmHg (ref 44.0–60.0)
pH, Ven: 7.31 (ref 7.250–7.430)
pO2, Ven: 52 mmHg — ABNORMAL HIGH (ref 32.0–45.0)

## 2019-11-04 LAB — HEPATIC FUNCTION PANEL
ALT: 83 U/L — ABNORMAL HIGH (ref 0–44)
AST: 84 U/L — ABNORMAL HIGH (ref 15–41)
Albumin: 2.8 g/dL — ABNORMAL LOW (ref 3.5–5.0)
Alkaline Phosphatase: 64 U/L (ref 38–126)
Bilirubin, Direct: 0.3 mg/dL — ABNORMAL HIGH (ref 0.0–0.2)
Indirect Bilirubin: 0.9 mg/dL (ref 0.3–0.9)
Total Bilirubin: 1.2 mg/dL (ref 0.3–1.2)
Total Protein: 6.3 g/dL — ABNORMAL LOW (ref 6.5–8.1)

## 2019-11-04 LAB — GLUCOSE, CAPILLARY
Glucose-Capillary: 110 mg/dL — ABNORMAL HIGH (ref 70–99)
Glucose-Capillary: 143 mg/dL — ABNORMAL HIGH (ref 70–99)
Glucose-Capillary: 253 mg/dL — ABNORMAL HIGH (ref 70–99)
Glucose-Capillary: 71 mg/dL (ref 70–99)
Glucose-Capillary: 78 mg/dL (ref 70–99)
Glucose-Capillary: 81 mg/dL (ref 70–99)

## 2019-11-04 LAB — MAGNESIUM: Magnesium: 1.7 mg/dL (ref 1.7–2.4)

## 2019-11-04 LAB — TYPE AND SCREEN
ABO/RH(D): A POS
Antibody Screen: NEGATIVE

## 2019-11-04 LAB — HEMOGLOBIN: Hemoglobin: 14.4 g/dL (ref 12.0–15.0)

## 2019-11-04 LAB — PROTIME-INR
INR: 1.2 (ref 0.8–1.2)
Prothrombin Time: 14.5 seconds (ref 11.4–15.2)

## 2019-11-04 LAB — CBC
HCT: 46.7 % — ABNORMAL HIGH (ref 36.0–46.0)
HCT: 38.5 % (ref 36.0–46.0)
Hemoglobin: 15.5 g/dL — ABNORMAL HIGH (ref 12.0–15.0)
Hemoglobin: 13 g/dL (ref 12.0–15.0)
MCH: 32.1 pg (ref 26.0–34.0)
MCH: 32.3 pg (ref 26.0–34.0)
MCHC: 33.2 g/dL (ref 30.0–36.0)
MCHC: 33.8 g/dL (ref 30.0–36.0)
MCV: 96.7 fL (ref 80.0–100.0)
MCV: 95.8 fL (ref 80.0–100.0)
Platelets: 145 10*3/uL — ABNORMAL LOW (ref 150–400)
Platelets: 141 10*3/uL — ABNORMAL LOW (ref 150–400)
RBC: 4.83 MIL/uL (ref 3.87–5.11)
RBC: 4.02 MIL/uL (ref 3.87–5.11)
RDW: 13.9 % (ref 11.5–15.5)
RDW: 14 % (ref 11.5–15.5)
WBC: 9.7 10*3/uL (ref 4.0–10.5)
WBC: 12.7 10*3/uL — ABNORMAL HIGH (ref 4.0–10.5)
nRBC: 0.2 % (ref 0.0–0.2)
nRBC: 0.2 % (ref 0.0–0.2)

## 2019-11-04 LAB — SARS CORONAVIRUS 2 BY RT PCR (HOSPITAL ORDER, PERFORMED IN ~~LOC~~ HOSPITAL LAB): SARS Coronavirus 2: NEGATIVE

## 2019-11-04 LAB — HEMOGLOBIN A1C
Hgb A1c MFr Bld: 6 % — ABNORMAL HIGH (ref 4.8–5.6)
Mean Plasma Glucose: 125.5 mg/dL

## 2019-11-04 LAB — LIPASE, BLOOD: Lipase: 65 U/L — ABNORMAL HIGH (ref 11–51)

## 2019-11-04 LAB — MRSA PCR SCREENING: MRSA by PCR: NEGATIVE

## 2019-11-04 LAB — PROCALCITONIN: Procalcitonin: 0.24 ng/mL

## 2019-11-04 LAB — PHOSPHORUS: Phosphorus: 5.2 mg/dL — ABNORMAL HIGH (ref 2.5–4.6)

## 2019-11-04 SURGERY — ESOPHAGOGASTRODUODENOSCOPY (EGD) WITH PROPOFOL
Anesthesia: General

## 2019-11-04 MED ORDER — SODIUM CHLORIDE 0.9 % IV SOLN
50.0000 ug/h | INTRAVENOUS | Status: DC
  Administered 2019-11-04 – 2019-11-07 (×6): 50 ug/h via INTRAVENOUS
  Filled 2019-11-04 (×17): qty 1

## 2019-11-04 MED ORDER — ONDANSETRON HCL 4 MG/2ML IJ SOLN: 4.0000 mg | Freq: Four times a day (QID) | INTRAMUSCULAR | Status: DC | PRN

## 2019-11-04 MED ORDER — INSULIN ASPART 100 UNIT/ML ~~LOC~~ SOLN
0.0000 [IU] | SUBCUTANEOUS | Status: DC
  Administered 2019-11-04: 2 [IU] via SUBCUTANEOUS
  Administered 2019-11-04: 8 [IU] via SUBCUTANEOUS
  Administered 2019-11-05: 3 [IU] via SUBCUTANEOUS
  Administered 2019-11-05: 5 [IU] via SUBCUTANEOUS
  Administered 2019-11-05 (×2): 3 [IU] via SUBCUTANEOUS
  Administered 2019-11-05: 2 [IU] via SUBCUTANEOUS
  Administered 2019-11-05 – 2019-11-06 (×2): 3 [IU] via SUBCUTANEOUS
  Administered 2019-11-06: 2 [IU] via SUBCUTANEOUS
  Administered 2019-11-06: 3 [IU] via SUBCUTANEOUS
  Administered 2019-11-07: 2 [IU] via SUBCUTANEOUS
  Administered 2019-11-07: 5 [IU] via SUBCUTANEOUS
  Administered 2019-11-07: 2 [IU] via SUBCUTANEOUS
  Administered 2019-11-07: 3 [IU] via SUBCUTANEOUS
  Administered 2019-11-07: 2 [IU] via SUBCUTANEOUS
  Administered 2019-11-08: 3 [IU] via SUBCUTANEOUS
  Administered 2019-11-08: 2 [IU] via SUBCUTANEOUS
  Filled 2019-11-04 (×17): qty 1

## 2019-11-04 MED ORDER — LORAZEPAM 1 MG PO TABS: 1.0000 mg | ORAL_TABLET | ORAL | Status: AC | PRN

## 2019-11-04 MED ORDER — SODIUM CHLORIDE 0.9 % IV SOLN
80.0000 mg | Freq: Once | INTRAVENOUS | Status: AC
  Administered 2019-11-04: 80 mg via INTRAVENOUS
  Filled 2019-11-04: qty 80

## 2019-11-04 MED ORDER — PANTOPRAZOLE SODIUM 40 MG IV SOLR: 40.0000 mg | Freq: Two times a day (BID) | INTRAVENOUS | Status: DC

## 2019-11-04 MED ORDER — ETOMIDATE 2 MG/ML IV SOLN
INTRAVENOUS | Status: AC
  Filled 2019-11-04: qty 10

## 2019-11-04 MED ORDER — THIAMINE HCL 100 MG/ML IJ SOLN
Freq: Once | INTRAVENOUS | Status: AC
  Filled 2019-11-04: qty 1000

## 2019-11-04 MED ORDER — LIDOCAINE VISCOUS HCL 2 % MT SOLN
15.0000 mL | OROMUCOSAL | Status: DC | PRN
  Administered 2019-11-04 – 2019-11-07 (×6): 15 mL via OROMUCOSAL
  Filled 2019-11-04 (×8): qty 15

## 2019-11-04 MED ORDER — SODIUM CHLORIDE 0.9 % IV SOLN
1.0000 g | Freq: Once | INTRAVENOUS | Status: AC
  Administered 2019-11-04: 1 g via INTRAVENOUS
  Filled 2019-11-04: qty 10

## 2019-11-04 MED ORDER — SODIUM CHLORIDE 0.9 % IV SOLN
8.0000 mg/h | INTRAVENOUS | Status: AC
  Administered 2019-11-04 – 2019-11-06 (×5): 8 mg/h via INTRAVENOUS
  Filled 2019-11-04 (×6): qty 80

## 2019-11-04 MED ORDER — PROPOFOL 10 MG/ML IV BOLUS
INTRAVENOUS | Status: AC
  Filled 2019-11-04: qty 20

## 2019-11-04 MED ORDER — LIDOCAINE HCL (PF) 2 % IJ SOLN
INTRAMUSCULAR | Status: AC
  Filled 2019-11-04: qty 5

## 2019-11-04 MED ORDER — CARVEDILOL 3.125 MG PO TABS
3.1250 mg | ORAL_TABLET | Freq: Two times a day (BID) | ORAL | Status: DC
  Filled 2019-11-04: qty 1

## 2019-11-04 MED ORDER — SODIUM CHLORIDE 0.9 % IV BOLUS
1000.0000 mL | Freq: Once | INTRAVENOUS | Status: AC
  Administered 2019-11-04: 1000 mL via INTRAVENOUS

## 2019-11-04 MED ORDER — SODIUM CHLORIDE 0.9 % IV SOLN
1.0000 g | Freq: Once | INTRAVENOUS | Status: DC
  Filled 2019-11-04: qty 10

## 2019-11-04 MED ORDER — ONDANSETRON HCL 4 MG/2ML IJ SOLN
INTRAMUSCULAR | Status: DC | PRN
  Administered 2019-11-04: 4 mg via INTRAVENOUS

## 2019-11-04 MED ORDER — ETOMIDATE 2 MG/ML IV SOLN
INTRAVENOUS | Status: DC | PRN
Start: 2019-11-04 — End: 2019-11-04
  Administered 2019-11-04: 14 mg via INTRAVENOUS

## 2019-11-04 MED ORDER — PHENYLEPHRINE HCL (PRESSORS) 10 MG/ML IV SOLN
INTRAVENOUS | Status: AC
  Filled 2019-11-04: qty 1

## 2019-11-04 MED ORDER — MOMETASONE FURO-FORMOTEROL FUM 200-5 MCG/ACT IN AERO
2.0000 | INHALATION_SPRAY | Freq: Two times a day (BID) | RESPIRATORY_TRACT | Status: DC
  Administered 2019-11-04 – 2019-11-08 (×8): 2 via RESPIRATORY_TRACT
  Filled 2019-11-04 (×2): qty 8.8

## 2019-11-04 MED ORDER — SUCCINYLCHOLINE CHLORIDE 20 MG/ML IJ SOLN
INTRAMUSCULAR | Status: DC | PRN
  Administered 2019-11-04: 120 mg via INTRAVENOUS

## 2019-11-04 MED ORDER — DEXAMETHASONE SODIUM PHOSPHATE 10 MG/ML IJ SOLN
INTRAMUSCULAR | Status: DC | PRN
  Administered 2019-11-04: 10 mg via INTRAVENOUS

## 2019-11-04 MED ORDER — FENTANYL CITRATE (PF) 100 MCG/2ML IJ SOLN: 25.0000 ug | INTRAMUSCULAR | Status: DC | PRN

## 2019-11-04 MED ORDER — ONDANSETRON HCL 4 MG/2ML IJ SOLN: 4.0000 mg | Freq: Once | INTRAMUSCULAR | Status: DC | PRN

## 2019-11-04 MED ORDER — SODIUM CHLORIDE 0.9 % IV SOLN: Freq: Once | INTRAVENOUS | Status: AC

## 2019-11-04 MED ORDER — SODIUM CHLORIDE 0.9 % IV SOLN
INTRAVENOUS | Status: DC | PRN
Start: 2019-11-04 — End: 2019-11-04

## 2019-11-04 MED ORDER — LORAZEPAM 2 MG/ML IJ SOLN: 1.0000 mg | INTRAMUSCULAR | Status: AC | PRN

## 2019-11-04 MED ORDER — SODIUM CHLORIDE 0.9 % IV SOLN
2.0000 g | INTRAVENOUS | Status: DC
  Administered 2019-11-05 – 2019-11-08 (×4): 2 g via INTRAVENOUS
  Filled 2019-11-04 (×3): qty 2
  Filled 2019-11-04: qty 20
  Filled 2019-11-04: qty 2
  Filled 2019-11-04: qty 20

## 2019-11-04 MED ORDER — PHENYLEPHRINE HCL (PRESSORS) 10 MG/ML IV SOLN
INTRAVENOUS | Status: DC | PRN
  Administered 2019-11-04 (×3): 100 ug via INTRAVENOUS

## 2019-11-04 MED ORDER — LIDOCAINE HCL (CARDIAC) PF 100 MG/5ML IV SOSY
PREFILLED_SYRINGE | INTRAVENOUS | Status: DC | PRN
  Administered 2019-11-04: 100 mg via INTRAVENOUS

## 2019-11-04 MED ORDER — TIOTROPIUM BROMIDE MONOHYDRATE 18 MCG IN CAPS
18.0000 ug | ORAL_CAPSULE | Freq: Every day | RESPIRATORY_TRACT | Status: DC
  Administered 2019-11-05 – 2019-11-08 (×4): 18 ug via RESPIRATORY_TRACT
  Filled 2019-11-04: qty 5

## 2019-11-04 MED ORDER — ONDANSETRON HCL 4 MG PO TABS: 4.0000 mg | ORAL_TABLET | Freq: Four times a day (QID) | ORAL | Status: DC | PRN

## 2019-11-04 MED ORDER — FLUTICASONE PROPIONATE 50 MCG/ACT NA SUSP
2.0000 | Freq: Every day | NASAL | Status: DC
  Administered 2019-11-05 – 2019-11-08 (×4): 2 via NASAL
  Filled 2019-11-04 (×2): qty 16

## 2019-11-04 NOTE — H&P (Signed)
History and Physical    [name] ZOX:[id number] DOB: 12-15-1965 DOA: 11/04/2019  PCP: Hyman Hopes, MD   Patient coming from: Home  I have personally briefly reviewed patient's old medical records in [facility]  Chief Complaint: Hematemesis  HPI: [name] is a 54 y.o. female with medical history significant for liver cirrhosis, diabetes mellitus, Hepatitis C, hypertension and sleep apnea who presents to the ER for evaluation of several episodes of hematemesis.  Patient states that she started throwing up blood about 2 AM this morning, initially was pink and then became coffee-ground with small clots.  She admits to taking BC powders and aspirin daily for a week for worsening neck pain.  Patient also continues to drink alcohol regularly.  She denies having any abdominal pain and stated that she had 2 episodes of maroon-colored stools today.  She denies feeling dizzy or lightheaded. She denies having any chest pain, shortness of breath, fever, chills, cough or abdominal pain She has been admitted to the hospital in the past for evaluation of upper GI bleed and had an upper endoscopy in December which showed Grade II esophageal varices with no bleeding and no stigmataof recent bleeding. Completely eradicated. Banded.  Portal hypertensive gastropathy.  Normal examined duodenum.  Hemoglobin is stable at 15 g/dl Chest x ray mild increased interstitial markings more likely chronic given the appearance of chest CT from 2016. No consolidation or effusion. Patient had an episode in the emergency room where she became hypotensive and lethargic.  She received 1 L fluid bolus with improvement in her blood pressure.               ED Course: Patient is a 54 year old African-American female with a history significant for hepatitis C, alcohol abuse and liver cirrhosis who presents for evaluation of hematemesis. Her hemoglobin is currently stable and vital signs are  stable.  Patient has been started on IV Protonix and octreotide.  She received a dose of IV Rocephin.  She will be admitted to the hospital for further evaluation.  Review of Systems: As per HPI otherwise 10 point review of systems negative.    Past Medical History:  Diagnosis Date  . Bleeding disorder (HCC)   . Diabetes mellitus without complication (HCC)   . Hepatitis C, acute   . Hypertension   . Sleep apnea     Past Surgical History:  Procedure Laterality Date  . ORIF TIBIA PLATEAU Left 07/02/2015   Procedure: OPEN REDUCTION INTERNAL FIXATION (ORIF) TIBIAL PLATEAU;  Surgeon: Juanell Fairly, MD;  Location: [facility] ORS;  Service: Orthopedics;  Laterality: Left;     reports that she has been smoking cigarettes. She has a 9.00 pack-year smoking history. She has never used smokeless tobacco. She reports current alcohol use. No history on file for drug use.  No Known Allergies  History reviewed. No pertinent family history.   Prior to Admission medications   Medication Sig Start Date End Date Taking? Authorizing Provider  albuterol (PROVENTIL HFA;VENTOLIN HFA) 108 (90 Base) MCG/ACT inhaler Inhale into the lungs. 03/10/13   [provider]  ALPRAZolam Prudy Feeler) 1 MG tablet Take 1 mg by mouth 3 (three) times daily.    [provider]  carvedilol (COREG) 3.125 MG tablet Take 3.125 mg by mouth. 09/03/16   [provider]  enalapril (VASOTEC) 20 MG tablet Take 20 mg by mouth. 04/15/13   [provider]  enoxaparin (LOVENOX) 40 MG/0.4ML injection Inject 0.4 mLs (40 mg total) into  the skin every 12 (twelve) hours. 07/04/15   Juanell Fairly, MD  exenatide (BYETTA) 5 MCG/0.02ML SOPN injection Inject into the skin.    [provider]  fluticasone (FLONASE) 50 MCG/ACT nasal spray Place 2 sprays into both nostrils daily.    [provider]  Fluticasone-Salmeterol (ADVAIR DISKUS) 250-50 MCG/DOSE AEPB INHALE 1 PUFF BY MOUTH TWICE A DAY FOR COPD  08/15/16   [provider]  furosemide (LASIX) 20 MG tablet TAKE 1 TABLET BY MOUTH EVERY MORNING FOR FLUID RETENTION X 3 DAYS 08/14/16   [provider]  insulin detemir (LEVEMIR) 100 UNIT/ML injection Inject 10-20 Units into the skin 2 (two) times daily. 20 units in morning and 10 units in evening (may add additional 20 units in evening depending on blood sugar)    [provider]  lidocaine (XYLOCAINE) 5 % ointment APPLY 1 APPLICATION TOPICALLY TWO (2) TIMES A DAY. APPLY 5 GRAMS TO AREA UP TO TWICE DAILY 08/16/16   [provider]  losartan (COZAAR) 25 MG tablet Take 25 mg by mouth daily.    [provider]  oxyCODONE (OXY IR/ROXICODONE) 5 MG immediate release tablet Take 1-2 tablets (5-10 mg total) by mouth every 3 (three) hours as needed for breakthrough pain. 07/04/15   Juanell Fairly, MD  pravastatin (PRAVACHOL) 40 MG tablet Take 40 mg by mouth at bedtime.    [provider]  tiotropium (SPIRIVA) 18 MCG inhalation capsule Place 18 mcg into inhaler and inhale daily.    [provider]    Physical Exam: Vitals:   11/04/19 0427 11/04/19 0804  BP: 132/80 113/81  Pulse: (!) 105 89  Resp: (!) 22 20  Temp: 99 F (37.2 C) 97.8 F (36.6 C)  TempSrc: Oral Oral  SpO2: 99% 97%     Vitals:   11/04/19 0427 11/04/19 0804  BP: 132/80 113/81  Pulse: (!) 105 89  Resp: (!) 22 20  Temp: 99 F (37.2 C) 97.8 F (36.6 C)  TempSrc: Oral Oral  SpO2: 99% 97%    Constitutional: NAD, lethargic but arouses easily Eyes: PERRL, lids and conjunctivae pale ENMT: Mucous membranes are moist.  Neck: normal, supple, no masses, no thyromegaly Respiratory: clear to auscultation bilaterally, no wheezing, no crackles. Normal respiratory effort. No accessory muscle use.  Cardiovascular: Regular rate and rhythm, no murmurs / rubs / gallops. No extremity edema. 2+ pedal pulses. No carotid bruits.  Abdomen: no tenderness, no masses palpated. No  hepatosplenomegaly. Bowel sounds positive.  Central adiposity Musculoskeletal: no clubbing / cyanosis. No joint deformity upper and lower extremities.  Skin: no rashes, lesions, ulcers.  Neurologic: No gross focal neurologic deficit.  Able to move all extremities Psychiatric: Normal mood and affect.   Labs on Admission: I have personally reviewed following labs and imaging studies  CBC: Recent Labs  Lab 11/04/19 0435  WBC 9.7  HGB 15.5*  HCT 46.7*  MCV 96.7  PLT 145*   Basic Metabolic Panel: Recent Labs  Lab 11/04/19 0435  NA 136  K 3.6  CL 102  CO2 26  GLUCOSE 191*  BUN 17  CREATININE 1.21*  CALCIUM 8.1*   GFR: CrCl cannot be calculated (Unknown ideal weight.). Liver Function Tests: Recent Labs  Lab 11/04/19 0435  AST 84*  ALT 83*  ALKPHOS 64  BILITOT 1.2  PROT 6.3*  ALBUMIN 2.8*   Recent Labs  Lab 11/04/19 0435  LIPASE 65*   No results for input(s): AMMONIA in the last 168 hours. Coagulation Profile: Recent  Labs  Lab 11/04/19 1004  INR 1.2   Cardiac Enzymes: No results for input(s): CKTOTAL, CKMB, CKMBINDEX, TROPONINI in the last 168 hours. BNP (last 3 results) No results for input(s): PROBNP in the last 8760 hours. HbA1C: No results for input(s): HGBA1C in the last 72 hours. CBG: No results for input(s): GLUCAP in the last 168 hours. Lipid Profile: No results for input(s): CHOL, HDL, LDLCALC, TRIG, CHOLHDL, LDLDIRECT in the last 72 hours. Thyroid Function Tests: No results for input(s): TSH, T4TOTAL, FREET4, T3FREE, THYROIDAB in the last 72 hours. Anemia Panel: No results for input(s): VITAMINB12, FOLATE, FERRITIN, TIBC, IRON, RETICCTPCT in the last 72 hours. Urine analysis:    Component Value Date/Time   COLORURINE Amber 01/31/2013 2039   APPEARANCEUR Hazy 01/31/2013 2039   LABSPEC 1.017 01/31/2013 2039   PHURINE 5.0 01/31/2013 2039   GLUCOSEU Negative 01/31/2013 2039   HGBUR 2+ 01/31/2013 2039   BILIRUBINUR Negative 01/31/2013 2039     KETONESUR Negative 01/31/2013 2039   PROTEINUR Negative 01/31/2013 2039   NITRITE Positive 01/31/2013 2039   LEUKOCYTESUR Trace 01/31/2013 2039    Radiological Exams on Admission: DG Chest Portable 1 View  Result Date: 11/04/2019 CLINICAL DATA:  Vomiting. EXAM: PORTABLE CHEST 1 VIEW COMPARISON:  07/02/2015 FINDINGS: Cardiomediastinal contours and hilar structures are normal. Mild increased interstitial markings throughout the chest. No lobar level consolidation.  No sign of pleural effusion. On limited assessment visualized skeletal structures are unremarkable. IMPRESSION: Mild increased interstitial markings more likely chronic given the appearance of chest CT from 2016. No consolidation or effusion. Electronically Signed   By: Donzetta Kohut M.D.   On: 11/04/2019 10:28    EKG: Independently reviewed.   Assessment/Plan Principal Problem:   Hematemesis Active Problems:   Diabetes mellitus without complication (HCC)   Hypertension   Hepatitis C, chronic (HCC)   Alcohol dependence (HCC)     Hematemesis ??  Variceal bleed versus peptic ulcer disease since patient admits to excessive use of NSAIDs We will keep patient NPO Continue Protonix drip and octreotide drip Monitor serial H&H and transfuse as needed Consult GI   History of alcohol dependence Monitor patient closely for signs and symptoms of alcohol withdrawal We will place patient on lorazepam and administer for CIWA score of 8 or greater Administer folic acid, MVI and thiamine   Hypertension Hold all antihypertensive medications for now    Diabetes mellitus Place patient on dextrose containing fluids Sliding scale coverage with insulin    Transaminitis Secondary to history of hepatitis C as well as alcohol dependence   DVT prophylaxis: SCD  Code Status: Full code Family Communication: Greater than 50% of time was spent discussing plan of care with patient at the bedside.  All questions and concerns have  been addressed.  She verbalizes understanding and agrees with the plan Disposition Plan: Back to previous home environment Consults called: GI    Zakariyah Freimark MD [facility]     11/04/2019, 10:59 AM

## 2019-11-04 NOTE — Progress Notes (Signed)
Ch attempted visit with Pt as per OR for prayer. Pt asleep at this time. Ch will pass on requisition to chaplains coming in the morning.

## 2019-11-04 NOTE — Progress Notes (Signed)
Verbal order for clear liquid diet. A&OX4. C/O mild chest pain that was worked up in [place]. No vomiting at this time. Pt due to void. Blood smear noted from vagina or rectum with CHG bath, not sure which site. We will continue to monitor this. Pt feels cold and has tingling in fingers. Warm blankets applied. Pulses to all extremities intact. No adventitious heart or lung sounds auscultated. Symmetrical movement in all extremities. Daughter at bedside, updated on POC.

## 2019-11-04 NOTE — Op Note (Signed)
[facility] Gastroenterology Patient Name: [name] Procedure Date: 11/04/2019 4:04 PM MRN: [id number] Account #: [id number] Date of Birth: Nov 29, 1965 Admit Type: Inpatient Age: 54 Room: [facility] ENDO ROOM 4 Gender: Female Note Status: Finalized Procedure:             Upper GI endoscopy Indications:           Melena Providers:             [name] MD, MD Referring MD:          No Local Md, MD (Referring MD) Medicines:             General Anesthesia Complications:         No immediate complications. Procedure:             Pre-Anesthesia Assessment:                        - Prior to the procedure, a History and Physical was                         performed, and patient medications and allergies were                         reviewed. The patient's tolerance of previous                         anesthesia was also reviewed. The risks and benefits                         of the procedure and the sedation options and risks                         were discussed with the patient. All questions were                         answered, and informed consent was obtained. Prior                         Anticoagulants: The patient has taken no previous                         anticoagulant or antiplatelet agents. ASA Grade                         Assessment: III - A patient with severe systemic                         disease. After reviewing the risks and benefits, the                         patient was deemed in satisfactory condition to                         undergo the procedure.                        After obtaining informed consent, the endoscope was  passed under direct vision. Throughout the procedure,                         the patient's blood pressure, pulse, and oxygen                         saturations were monitored continuously. The Endoscope                         was introduced through the mouth, and advanced to the                          second part of duodenum. The upper GI endoscopy was                         accomplished without difficulty. The patient tolerated                         the procedure well. Findings:      Grade III varices were found in the lower third of the esophagus. Four       bands were successfully placed with incomplete eradication of varices. A       slow ooze remained at the end of the procedure.      Many non-bleeding linear gastric ulcers with no stigmata of bleeding       were found in the gastric antrum. Coagulation for hemostasis using argon       plasma at 2 liters/minute and 30 watts was successful.      A single 2 mm angiodysplastic lesion without bleeding was found in the       duodenal bulb. Coagulation for hemostasis using argon plasma at 2       liters/minute and 30 watts was successful. Impression:            - Grade III esophageal varices. Incompletely                         eradicated. Banded.                        - Non-bleeding gastric ulcers with no stigmata of                         bleeding. Treated with argon plasma coagulation (APC).                        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 Treated with argon plasma coagulation (APC).                        - No specimens collected. Recommendation:        - Return patient to hospital ward for ongoing care.                        - NPO.                        - Continue present medications. Procedure Code(s):     ---  Professional ---                        [phone], Esophagogastroduodenoscopy, flexible,                         transoral; with band ligation of esophageal/gastric                         varices                        43255, 59, Esophagogastroduodenoscopy, flexible,                         transoral; with control of bleeding, any method Diagnosis Code(s):     --- Professional ---                        K92.1, Melena (includes Hematochezia)                         I85.00, Esophageal varices without bleeding                        K25.9, Gastric ulcer, unspecified as acute or chronic,                         without hemorrhage or perforation                        K31.819, Angiodysplasia of stomach and duodenum                         without bleeding CPT copyright 2019 American Medical Association. All rights reserved. The codes documented in this report are preliminary and upon coder review may  be revised to meet current compliance requirements. [name] MD, MD 11/04/2019 4:32:27 PM This report has been signed electronically. Number of Addenda: 0 Note Initiated On: 11/04/2019 4:04 PM Estimated Blood Loss:  Estimated blood loss: none.      [facility]

## 2019-11-04 NOTE — Consult Note (Signed)
CRITICAL CARE PROGRESS NOTE    Name: [name] MRN: [id number] DOB: 1965-10-31     LOS: 0   SUBJECTIVE FINDINGS & SIGNIFICANT EVENTS    Patient description:  44F hx of HCV s/p tx, Liver cirrohsis, DM, HTN, OSA, obesity ,portal hypertension and esophageal varices,  alcoholism reports drinking shots of whiskey, active smoker currently reports 1/2 pack previosly at 2ppd, she complained of feeling weak and febrile for appx 1 week.  She further declined and reports having intermittent NVD with blood per os and rectum.  In ED she was found to be hypotensive and altered she received IVF with some improvement in vitals however required levophed for persistent hypotension with consult to PCCM for further evaluation and mgt.   Lines/tubes :   Microbiology/Sepsis markers: Results for orders placed or performed during the hospital encounter of 11/04/19  SARS Coronavirus 2 by RT PCR (hospital order, performed in [facility] hospital lab) Nasopharyngeal Nasopharyngeal Swab     Status: None   Collection Time: 11/04/19 11:21 AM   Specimen: Nasopharyngeal Swab  Result Value Ref Range Status   SARS Coronavirus 2 NEGATIVE NEGATIVE Final    Comment: (NOTE) SARS-CoV-2 target nucleic acids are NOT DETECTED.  The SARS-CoV-2 RNA is generally detectable in upper and lower respiratory specimens during the acute phase of infection. The lowest concentration of SARS-CoV-2 viral copies this assay can detect is 250 copies / mL. A negative result does not preclude SARS-CoV-2 infection and should not be used as the sole basis for treatment or other patient management decisions.  A negative result may occur with improper specimen collection / handling, submission of specimen other than nasopharyngeal swab, presence of viral  mutation(s) within the areas targeted by this assay, and inadequate number of viral copies (<250 copies / mL). A negative result must be combined with clinical observations, patient history, and epidemiological information.  Fact Sheet for Patients:   BoilerBrush.com.cy  Fact Sheet for Healthcare Providers: https://pope.com/  This test is not yet approved or  cleared by the [place] FDA and has been authorized for detection and/or diagnosis of SARS-CoV-2 by FDA under an Emergency Use Authorization (EUA).  This EUA will remain in effect (meaning this test can be used) for the duration of the COVID-19 declaration under Section 564(b)(1) of the Act, 21 U.S.C. section 360bbb-3(b)(1), unless the authorization is terminated or revoked sooner.  Performed at [facility], [address], [place], [place] [address]     Anti-infectives:  Anti-infectives (From admission, onward)   Start     Dose/Rate Route Frequency Ordered Stop   11/04/19 1000  cefTRIAXone (ROCEPHIN) 1 g in sodium chloride 0.9 % 100 mL IVPB        1 g 200 mL/hr over 30 Minutes Intravenous  Once 11/04/19 0959 11/04/19 1513       Consults: Treatment Team:  [name], MD     Tests / Events:    PORTABLE CHEST 1 VIEW  COMPARISON:  07/02/2015  FINDINGS: Cardiomediastinal contours and hilar structures are normal.  Mild increased interstitial markings throughout the chest.  No lobar level consolidation.  No sign of pleural effusion.  On limited assessment visualized skeletal structures are unremarkable.  IMPRESSION: Mild increased interstitial markings more likely chronic given the appearance of chest CT from 2016. No consolidation or effusion.   Electronically Signed   By: Donzetta Kohut M.D.   On: 11/04/2019 10:28   PAST MEDICAL HISTORY   Past Medical History:  Diagnosis  Date  . Bleeding disorder (HCC)   . Diabetes  mellitus without complication (HCC)   . Hepatitis C, acute   . Hypertension   . Sleep apnea      SURGICAL HISTORY   Past Surgical History:  Procedure Laterality Date  . ORIF TIBIA PLATEAU Left 07/02/2015   Procedure: OPEN REDUCTION INTERNAL FIXATION (ORIF) TIBIAL PLATEAU;  Surgeon: Juanell Fairly, MD;  Location: [facility] ORS;  Service: Orthopedics;  Laterality: Left;     FAMILY HISTORY   History reviewed. No pertinent family history.   SOCIAL HISTORY   Social History   Tobacco Use  . Smoking status: Current Every Day Smoker    Packs/day: 0.25    Years: 36.00    Pack years: 9.00    Types: Cigarettes  . Smokeless tobacco: Never Used  Substance Use Topics  . Alcohol use: Yes    Alcohol/week: 0.0 standard drinks  . Drug use: Not on file     MEDICATIONS   Current Medication:  Current Facility-Administered Medications:  .  fluticasone (FLONASE) 50 MCG/ACT nasal spray 2 spray, 2 spray, Each Nare, Daily, Agbata, Tochukwu, MD .  insulin aspart (novoLOG) injection 0-15 Units, 0-15 Units, Subcutaneous, Q4H, Agbata, Tochukwu, MD .  LORazepam (ATIVAN) tablet 1-4 mg, 1-4 mg, Oral, Q1H PRN **OR** LORazepam (ATIVAN) injection 1-4 mg, 1-4 mg, Intravenous, Q1H PRN, Agbata, Tochukwu, MD .  mometasone-formoterol (DULERA) 200-5 MCG/ACT inhaler 2 puff, 2 puff, Inhalation, BID, Agbata, Tochukwu, MD .  octreotide (SANDOSTATIN) 500 mcg in sodium chloride 0.9 % 250 mL (2 mcg/mL) infusion, 50 mcg/hr, Intravenous, Continuous, Willy Eddy, MD, Last Rate: 25 mL/hr at 11/04/19 1213, 50 mcg/hr at 11/04/19 1213 .  ondansetron (ZOFRAN) tablet 4 mg, 4 mg, Oral, Q6H PRN **OR** ondansetron (ZOFRAN) injection 4 mg, 4 mg, Intravenous, Q6H PRN, Agbata, Tochukwu, MD .  pantoprazole (PROTONIX) 80 mg in sodium chloride 0.9 % 100 mL (0.8 mg/mL) infusion, 8 mg/hr, Intravenous, Continuous, Willy Eddy, MD .  Melene Muller ON 11/07/2019] pantoprazole (PROTONIX) injection 40 mg, 40 mg, Intravenous, Q12H, Willy Eddy, MD .  tiotropium [facility]) inhalation capsule ([facility] use ONLY) 18 mcg, 18 mcg, Inhalation, Daily, Agbata, Tochukwu, MD  Current Outpatient Medications:  .  cyclobenzaprine (FLEXERIL) 10 MG tablet, Take 1 tablet by mouth daily in the afternoon., Disp: , Rfl:  .  diclofenac Sodium (VOLTAREN) 1 % GEL, Apply 1 application topically every 6 (six) hours as needed., Disp: , Rfl:  .  escitalopram (LEXAPRO) 5 MG tablet, Take 5 mg by mouth at bedtime., Disp: , Rfl:  .  fluticasone (FLONASE) 50 MCG/ACT nasal spray, Place 2 sprays into both nostrils daily., Disp: , Rfl:  .  furosemide (LASIX) 20 MG tablet, TAKE 1 TABLET BY MOUTH EVERY MORNING FOR FLUID RETENTION X 3 DAYS, Disp: , Rfl: 2 .  insulin detemir (LEVEMIR) 100 UNIT/ML injection, Inject 10-20 Units into the skin 2 (two) times daily. 20 units in morning and 10 units in evening (may add additional 20 units in evening depending on blood sugar), Disp: , Rfl:  .  lidocaine (XYLOCAINE) 5 % ointment, APPLY 1 APPLICATION TOPICALLY TWO (2) TIMES A DAY. APPLY 5 GRAMS TO AREA UP TO TWICE DAILY, Disp: , Rfl: 3 .  loratadine (CLARITIN) 10 MG tablet, Take 10 mg by mouth daily., Disp: , Rfl:  .  losartan (COZAAR) 100 MG tablet, Take 100 mg by mouth daily., Disp: , Rfl:  .  oxyCODONE (OXY IR/ROXICODONE) 5 MG immediate release tablet, Take 1-2 tablets (5-10 mg total)  by mouth every 3 (three) hours as needed for breakthrough pain., Disp: 60 tablet, Rfl: 0 .  SPIRIVA RESPIMAT 2.5 MCG/ACT AERS, Inhale 2 puffs into the lungs daily in the afternoon., Disp: , Rfl:  .  SUBOXONE 2-0.5 MG FILM, Place 1 Film under the tongue 3 (three) times daily., Disp: , Rfl:  .  triamcinolone ointment (KENALOG) 0.5 %, Apply 1 application topically 2 (two) times daily., Disp: , Rfl:  .  albuterol (PROVENTIL HFA;VENTOLIN HFA) 108 (90 Base) MCG/ACT inhaler, Inhale into the lungs., Disp: , Rfl:  .  enoxaparin (LOVENOX) 40 MG/0.4ML injection, Inject 0.4 mLs (40 mg total) into the skin  every 12 (twelve) hours. (Patient not taking: Reported on 11/04/2019), Disp: 60 Syringe, Rfl: 0    ALLERGIES   Patient has no known allergies.    REVIEW OF SYSTEMS     10 point ROS is negative except for generalized malaise  PHYSICAL EXAMINATION   Vital Signs: Temp:  [97.8 F (36.6 C)-99 F (37.2 C)] 97.8 F (36.6 C) (07/08 0804) Pulse Rate:  [73-105] 78 (07/08 1520) Resp:  [14-37] 14 (07/08 1520) BP: (75-132)/(43-84) 80/43 (07/08 1520) SpO2:  [90 %-100 %] 97 % (07/08 1520)  GENERAL:age appropriate chronically ill appearing HEAD: Normocephalic, atraumatic.  EYES: Pupils equal, round, reactive to light.  No scleral icterus.  MOUTH: Moist mucosal membrane. NECK: Supple. No thyromegaly. No nodules. No JVD.  PULMONARY: mild rhonchi bilaterally  CARDIOVASCULAR: S1 and S2. Regular rate and rhythm. No murmurs, rubs, or gallops.  GASTROINTESTINAL: Soft, nontender, non-distended. No masses. Positive bowel sounds. No hepatosplenomegaly.  MUSCULOSKELETAL: No swelling, clubbing, or edema.  NEUROLOGIC: Mild distress due to acute illness SKIN:intact,warm,dry   PERTINENT DATA     Infusions: . octreotide  (SANDOSTATIN)    IV infusion 50 mcg/hr (11/04/19 1213)  . pantoprozole (PROTONIX) infusion     Scheduled Medications: . fluticasone  2 spray Each Nare Daily  . insulin aspart  0-15 Units Subcutaneous Q4H  . mometasone-formoterol  2 puff Inhalation BID  . [START ON 11/07/2019] pantoprazole  40 mg Intravenous Q12H  . tiotropium  18 mcg Inhalation Daily   PRN Medications: LORazepam **OR** LORazepam, ondansetron **OR** ondansetron (ZOFRAN) IV Hemodynamic parameters:   Intake/Output: No intake/output data recorded.  Ventilator  Settings:      LAB RESULTS:  Basic Metabolic Panel: Recent Labs  Lab 11/04/19 0435 11/04/19 1340  NA 136  --   K 3.6  --   CL 102  --   CO2 26  --   GLUCOSE 191*  --   BUN 17  --   CREATININE 1.21*  --   CALCIUM 8.1*  --   MG  --  1.7   PHOS  --  5.2*   Liver Function Tests: Recent Labs  Lab 11/04/19 0435  AST 84*  ALT 83*  ALKPHOS 64  BILITOT 1.2  PROT 6.3*  ALBUMIN 2.8*   Recent Labs  Lab 11/04/19 0435  LIPASE 65*   No results for input(s): AMMONIA in the last 168 hours. CBC: Recent Labs  Lab 11/04/19 0435 11/04/19 1340  WBC 9.7 12.7*  HGB 15.5* 13.0  HCT 46.7* 38.5  MCV 96.7 95.8  PLT 145* 141*   Cardiac Enzymes: No results for input(s): CKTOTAL, CKMB, CKMBINDEX, TROPONINI in the last 168 hours. BNP: Invalid input(s): POCBNP CBG: Recent Labs  Lab 11/04/19 1316 11/04/19 1352  GLUCAP 71 78       IMAGING RESULTS:  Imaging: DG Chest Portable 1 View  Result Date: 11/04/2019 CLINICAL DATA:  Vomiting. EXAM: PORTABLE CHEST 1 VIEW COMPARISON:  07/02/2015 FINDINGS: Cardiomediastinal contours and hilar structures are normal. Mild increased interstitial markings throughout the chest. No lobar level consolidation.  No sign of pleural effusion. On limited assessment visualized skeletal structures are unremarkable. IMPRESSION: Mild increased interstitial markings more likely chronic given the appearance of chest CT from 2016. No consolidation or effusion. Electronically Signed   By: Donzetta KohutGeoffrey  Wile M.D.   On: 11/04/2019 10:28   @PROBHOSP @ DG Chest Portable 1 View  Result Date: 11/04/2019 CLINICAL DATA:  Vomiting. EXAM: PORTABLE CHEST 1 VIEW COMPARISON:  07/02/2015 FINDINGS: Cardiomediastinal contours and hilar structures are normal. Mild increased interstitial markings throughout the chest. No lobar level consolidation.  No sign of pleural effusion. On limited assessment visualized skeletal structures are unremarkable. IMPRESSION: Mild increased interstitial markings more likely chronic given the appearance of chest CT from 2016. No consolidation or effusion. Electronically Signed   By: Donzetta KohutGeoffrey  Wile M.D.   On: 11/04/2019 10:28     ASSESSMENT AND PLAN    -Multidisciplinary rounds held today  Upper  GI bleed   - s/p IVF and protonix gtt  -plan for EGD today  - hx of esophageal varices with portal hypertension   - patient reports taking high dose NSAID in past 1 week  -GI on case appreciate input -variceal bleed - octreotide  Interstitial lung disease    - supportive care    - IS at bedside    - duoneb   -flutter valve   -life long smoker   -nicotine replacement   Profound hypotension    - possible due to volume depletion post hematemesis and melena vs sepsis related from SBP or GI source of infection    - septic workup    - procalcitonin   -paracentesis   - empiric abx   - IVF LR 100/h    - h/h q6h    -continue rocephin     -nasal MRSA pcr  Alcoholism   - CIWA protocol     - s/p banana bag- Monitor vitals and withrdawal symptoms    OSA with OHS overlap   - likely will need extubation to BIPAP   ID -continue IV abx as prescibed -follow up cultures  GI/Nutrition GI PROPHYLAXIS as indicated DIET-->TF's as tolerated Constipation protocol as indicated  ENDO - ICU hypoglycemic\Hyperglycemia protocol -check FSBS per protocol   ELECTROLYTES -follow labs as needed -replace as needed -pharmacy consultation   DVT/GI PRX ordered -SCDs  TRANSFUSIONS AS NEEDED MONITOR FSBS ASSESS the need for LABS as needed   Critical care provider statement:    Critical care time (minutes):  109   Critical care time was exclusive of:  Separately billable procedures and treating other patients   Critical care was necessary to treat or prevent imminent or life-threatening deterioration of the following conditions:  profound hypotension, liver cirrosis, upper gi bleed, morbid obesity, OSA, ILD, multiple comoribid conditions   Critical care was time spent personally by me on the following activities:  Development of treatment plan with patient or surrogate, discussions with consultants, evaluation of patient's response to treatment, examination of patient, obtaining history  from patient or surrogate, ordering and performing treatments and interventions, ordering and review of laboratory studies and re-evaluation of patient's condition.  I assumed direction of critical care for this patient from another provider in my specialty: no    This document was prepared using Dragon voice recognition software and may include unintentional dictation  errors.    Vida Rigger, M.D.  Division of Pulmonary & Critical Care Medicine  [facility]

## 2019-11-04 NOTE — ED Notes (Signed)
Pt to endoscopy.

## 2019-11-04 NOTE — Anesthesia Postprocedure Evaluation (Signed)
Anesthesia Post Note  Patient: [name]  Procedure(s) Performed: ESOPHAGOGASTRODUODENOSCOPY (EGD) WITH PROPOFOL (N/A )  Patient location during evaluation: PACU Anesthesia Type: General Level of consciousness: awake and alert Pain management: pain level controlled Vital Signs Assessment: post-procedure vital signs reviewed and stable Respiratory status: spontaneous breathing and respiratory function stable Cardiovascular status: stable Anesthetic complications: no   No complications documented.   Last Vitals:  Vitals:   11/04/19 1706 11/04/19 1715  BP:  (!) 91/42  Pulse: (!) 102 99  Resp: 17 (!) 22  Temp:    SpO2: 99% 100%    Last Pain:  Vitals:   11/04/19 1715  TempSrc:   PainSc: 0-No pain                 Negan Grudzien K

## 2019-11-04 NOTE — ED Notes (Signed)
Attempted to call ICU, RN Irving Burton not available, will call back. Pt in endoscopy at present. Pt to go to ICU after procedure.

## 2019-11-04 NOTE — Anesthesia Preprocedure Evaluation (Signed)
Anesthesia Evaluation  Patient identified by MRN, date of birth, ID band Patient awake    Reviewed: Allergy & Precautions, NPO status , Patient's Chart, lab work & pertinent test results  History of Anesthesia Complications Negative for: history of anesthetic complications  Airway Mallampati: III       Dental   Pulmonary sleep apnea, Continuous Positive Airway Pressure Ventilation and Oxygen sleep apnea , COPD,  COPD inhaler and oxygen dependent, Current Smoker,           Cardiovascular hypertension, Pt. on medications (-) Past MI and (-) CHF (-) dysrhythmias (-) Valvular Problems/Murmurs     Neuro/Psych neg Seizures    GI/Hepatic neg GERD  ,(+) Cirrhosis     substance abuse  alcohol use, Hepatitis -, C  Endo/Other  diabetes, Type 2, Oral Hypoglycemic Agents  Renal/GU negative Renal ROS     Musculoskeletal   Abdominal   Peds  Hematology   Anesthesia Other Findings   Reproductive/Obstetrics                             Anesthesia Physical Anesthesia Plan  ASA: III and emergent  Anesthesia Plan: General   Post-op Pain Management:    Induction: Intravenous  PONV Risk Score and Plan: 2 and Ondansetron and Treatment may vary due to age or medical condition  Airway Management Planned: Oral ETT  Additional Equipment:   Intra-op Plan:   Post-operative Plan: Possible Post-op intubation/ventilation  Informed Consent: I have reviewed the patients History and Physical, chart, labs and discussed the procedure including the risks, benefits and alternatives for the proposed anesthesia with the patient or authorized representative who has indicated his/her understanding and acceptance.       Plan Discussed with:   Anesthesia Plan Comments:         Anesthesia Quick Evaluation

## 2019-11-04 NOTE — ED Notes (Signed)
Pt updated in WR, VS reassessed °

## 2019-11-04 NOTE — ED Provider Notes (Signed)
[facility] Emergency Department Provider Note    First MD Initiated Contact with Patient 11/04/19 [phone]     (approximate)  I have reviewed the triage vital signs and the nursing notes.   HISTORY  Chief Complaint Hematemesis    HPI [name] is a 54 y.o. female with the below listed past medical history presents to the ER after waking up with several episodes of hematemesis.  States she has a history of varices as well as cirrhosis.  States that she has been taking BC Goody's and aspirin over the past few days and did have a glass of beer last night.  She is not on any anticoagulation.  Has been admitted to the hospital for upper GI bleeds in the past with most recent endoscopy being in December of last year showing evidence of varices portal hypertensive gastropathy.  She denies any abdominal pain at this time.  No chest pain.   Endoscopy 03/2019 Impression:      - Grade II esophageal varices with no bleeding and no             stigmataof recent bleeding. Completely eradicated.             Banded.             - Portal hypertensive gastropathy.             - Normal examined duodenum.             - No specimens collected.    Past Medical History:  Diagnosis Date  . Bleeding disorder (HCC)   . Diabetes mellitus without complication (HCC)   . Hepatitis C, acute   . Hypertension   . Sleep apnea    History reviewed. No pertinent family history. Past Surgical History:  Procedure Laterality Date  . ORIF TIBIA PLATEAU Left 07/02/2015   Procedure: OPEN REDUCTION INTERNAL FIXATION (ORIF) TIBIAL PLATEAU;  Surgeon: Juanell Fairly, MD;  Location: [facility] ORS;  Service: Orthopedics;  Laterality: Left;   Patient Active Problem List   Diagnosis Date Noted  . Tibial plateau fracture 07/02/2015      Prior to Admission medications   Medication Sig Start Date End Date Taking? Authorizing  Provider  albuterol (PROVENTIL HFA;VENTOLIN HFA) 108 (90 Base) MCG/ACT inhaler Inhale into the lungs. 03/10/13   [provider]  ALPRAZolam Prudy Feeler) 1 MG tablet Take 1 mg by mouth 3 (three) times daily.    [provider]  carvedilol (COREG) 3.125 MG tablet Take 3.125 mg by mouth. 09/03/16   [provider]  enalapril (VASOTEC) 20 MG tablet Take 20 mg by mouth. 04/15/13   [provider]  enoxaparin (LOVENOX) 40 MG/0.4ML injection Inject 0.4 mLs (40 mg total) into the skin every 12 (twelve) hours. 07/04/15   Juanell Fairly, MD  exenatide (BYETTA) 5 MCG/0.02ML SOPN injection Inject into the skin.    [provider]  fluticasone (FLONASE) 50 MCG/ACT nasal spray Place 2 sprays into both nostrils daily.    [provider]  Fluticasone-Salmeterol (ADVAIR DISKUS) 250-50 MCG/DOSE AEPB INHALE 1 PUFF BY MOUTH TWICE A DAY FOR COPD 08/15/16   [provider]  furosemide (LASIX) 20 MG tablet TAKE 1 TABLET BY MOUTH EVERY MORNING FOR FLUID RETENTION X 3 DAYS 08/14/16   [provider]  insulin detemir (LEVEMIR) 100 UNIT/ML injection Inject 10-20 Units into the skin 2 (two) times daily. 20 units in morning and 10 units in evening (may add additional 20 units  in evening depending on blood sugar)    [provider]  lidocaine (XYLOCAINE) 5 % ointment APPLY 1 APPLICATION TOPICALLY TWO (2) TIMES A DAY. APPLY 5 GRAMS TO AREA UP TO TWICE DAILY 08/16/16   [provider]  losartan (COZAAR) 25 MG tablet Take 25 mg by mouth daily.    [provider]  oxyCODONE (OXY IR/ROXICODONE) 5 MG immediate release tablet Take 1-2 tablets (5-10 mg total) by mouth every 3 (three) hours as needed for breakthrough pain. 07/04/15   Juanell Fairly, MD  pravastatin (PRAVACHOL) 40 MG tablet Take 40 mg by mouth at bedtime.    [provider]  tiotropium (SPIRIVA) 18 MCG inhalation capsule Place 18 mcg into inhaler and inhale daily.    [provider]    Allergies Patient has no known allergies.    Social History Social History   Tobacco Use  . Smoking status: Current Every Day Smoker    Packs/day: 0.25    Years: 36.00    Pack years: 9.00    Types: Cigarettes  . Smokeless tobacco: Never Used  Substance Use Topics  . Alcohol use: Yes    Alcohol/week: 0.0 standard drinks  . Drug use: Not on file    Review of Systems Patient denies headaches, rhinorrhea, blurry vision, numbness, shortness of breath, chest pain, edema, cough, abdominal pain, nausea, vomiting, diarrhea, dysuria, fevers, rashes or hallucinations unless otherwise stated above in HPI. ____________________________________________   PHYSICAL EXAM:  VITAL SIGNS: Vitals:   11/04/19 0427 11/04/19 0804  BP: 132/80 113/81  Pulse: (!) 105 89  Resp: (!) 22 20  Temp: 99 F (37.2 C) 97.8 F (36.6 C)  SpO2: 99% 97%    Constitutional: Alert and oriented.  Eyes: Conjunctivae are normal.  Head: Atraumatic. Nose: No congestion/rhinnorhea. Mouth/Throat: Mucous membranes are moist.   Neck: No stridor. Painless ROM.  Cardiovascular: Normal rate, regular rhythm. Grossly normal heart sounds.  Good peripheral circulation. Respiratory: Normal respiratory effort.  No retractions. Lungs CTAB. Gastrointestinal: Soft and nontender. No distention. No abdominal bruits. No CVA tenderness. Genitourinary:  Musculoskeletal: No lower extremity tenderness nor edema.  No joint effusions. Neurologic:  Normal speech and language. No gross focal neurologic deficits are appreciated. No facial droop Skin:  Skin is warm, dry and intact. No rash noted. Psychiatric: Mood and affect are normal. Speech and behavior are normal.  ____________________________________________   LABS (all labs ordered are listed, but only abnormal results are displayed)  Results for orders placed or performed during the hospital encounter of 11/04/19 (from the past 24 hour(s))  Lipase, blood      Status: Abnormal   Collection Time: 11/04/19  4:35 AM  Result Value Ref Range   Lipase 65 (H) 11 - 51 U/L  CBC     Status: Abnormal   Collection Time: 11/04/19  4:35 AM  Result Value Ref Range   WBC 9.7 4.0 - 10.5 K/uL   RBC 4.83 3.87 - 5.11 MIL/uL   Hemoglobin 15.5 (H) 12.0 - 15.0 g/dL   HCT 13.0 (H) 36 - 46 %   MCV 96.7 80.0 - 100.0 fL   MCH 32.1 26.0 - 34.0 pg   MCHC 33.2 30.0 - 36.0 g/dL   RDW 86.5 78.4 - 69.6 %   Platelets 145 (L) 150 - 400 K/uL   nRBC 0.2 0.0 - 0.2 %  Hepatic function panel     Status: Abnormal   Collection Time: 11/04/19  4:35 AM  Result Value Ref Range  Total Protein 6.3 (L) 6.5 - 8.1 g/dL   Albumin 2.8 (L) 3.5 - 5.0 g/dL   AST 84 (H) 15 - 41 U/L   ALT 83 (H) 0 - 44 U/L   Alkaline Phosphatase 64 38 - 126 U/L   Total Bilirubin 1.2 0.3 - 1.2 mg/dL   Bilirubin, Direct 0.3 (H) 0.0 - 0.2 mg/dL   Indirect Bilirubin 0.9 0.3 - 0.9 mg/dL  Basic metabolic panel     Status: Abnormal   Collection Time: 11/04/19  4:35 AM  Result Value Ref Range   Sodium 136 135 - 145 mmol/L   Potassium 3.6 3.5 - 5.1 mmol/L   Chloride 102 98 - 111 mmol/L   CO2 26 22 - 32 mmol/L   Glucose, Bld 191 (H) 70 - 99 mg/dL   BUN 17 6 - 20 mg/dL   Creatinine, Ser 1.32 (H) 0.44 - 1.00 mg/dL   Calcium 8.1 (L) 8.9 - 10.3 mg/dL   GFR calc non Af Amer 51 (L) >60 mL/min   GFR calc Af Amer 59 (L) >60 mL/min   Anion gap 8 5 - 15  Type and screen [facility]     Status: None (Preliminary result)   Collection Time: 11/04/19 10:04 AM  Result Value Ref Range   ABO/RH(D) PENDING    Antibody Screen PENDING    Sample Expiration      11/07/2019,2359 Performed at [facility], [address] [place], [place] [address]   Protime-INR     Status: None   Collection Time: 11/04/19 10:04 AM  Result Value Ref Range   Prothrombin Time 14.5 11.4 - 15.2 seconds   INR 1.2 0.8 - 1.2    ____________________________________________ ____________________________________________  RADIOLOGY  I personally reviewed all radiographic images ordered to evaluate for the above acute complaints and reviewed radiology reports and findings.  These findings were personally discussed with the patient.  Please see medical record for radiology report.  ____________________________________________   PROCEDURES  Procedure(s) performed:  .Critical Care Performed by: Willy Eddy, MD Authorized by: Willy Eddy, MD   Critical care provider statement:    Critical care time (minutes):  30   Critical care time was exclusive of:  Separately billable procedures and treating other patients   Critical care was necessary to treat or prevent imminent or life-threatening deterioration of the following conditions:  Circulatory failure   Critical care was time spent personally by me on the following activities:  Development of treatment plan with patient or surrogate, discussions with consultants, evaluation of patient's response to treatment, examination of patient, obtaining history from patient or surrogate, ordering and performing treatments and interventions, ordering and review of laboratory studies, ordering and review of radiographic studies, pulse oximetry, re-evaluation of patient's condition and review of old charts      Critical Care performed: yes ____________________________________________   INITIAL IMPRESSION / ASSESSMENT AND PLAN / ED COURSE  Pertinent labs & imaging results that were available during my care of the patient were reviewed by me and considered in my medical decision making (see chart for details).   DDX: Upper GI bleed, variceal bleed, gastritis, liver failure, acute blood loss anemia, enteritis  BERNIE FOBES is a 54 y.o. who presents to the ED with symptoms as described above. Patient presenting with hematemesis in the setting of known  esophageal varices. Will start on a Protonix as well as octreotide and give IV Rocephin. Hemoglobin is actually high. She is not hypotensive. Will consult GI.  Will discuss with hospitalist for admission.     The patient was evaluated in Emergency Department today for the symptoms described in the history of present illness. He/she was evaluated in the context of the global COVID-19 pandemic, which necessitated consideration that the patient might be at risk for infection with the SARS-CoV-2 virus that causes COVID-19. Institutional protocols and algorithms that pertain to the evaluation of patients at risk for COVID-19 are in a state of rapid change based on information released by regulatory bodies including the CDC and federal and state organizations. These policies and algorithms were followed during the patient's care in the ED.  As part of my medical decision making, I reviewed the following data within the electronic MEDICAL RECORD NUMBER Nursing notes reviewed and incorporated, Labs reviewed, notes from prior ED visits and [place] Controlled Substance Database   ____________________________________________   FINAL CLINICAL IMPRESSION(S) / ED DIAGNOSES  Final diagnoses:  Hematemesis with nausea      NEW MEDICATIONS STARTED DURING THIS VISIT:  New Prescriptions   No medications on file     Note:  This document was prepared using Dragon voice recognition software and may include unintentional dictation errors.    Willy Eddyobinson, Nathanyl Andujo, MD 11/04/19 1040

## 2019-11-04 NOTE — Anesthesia Procedure Notes (Signed)
Procedure Name: Intubation Date/Time: 11/04/2019 4:08 PM Performed by: Irving Burton, CRNA Pre-anesthesia Checklist: Patient identified, Patient being monitored, Timeout performed, Emergency Drugs available and Suction available Patient Re-evaluated:Patient Re-evaluated prior to induction Oxygen Delivery Method: Circle system utilized Preoxygenation: Pre-oxygenation with 100% oxygen Induction Type: IV induction and Rapid sequence Laryngoscope Size: 3 and McGraph Grade View: Grade I Tube type: Oral Tube size: 7.0 mm Number of attempts: 1 Airway Equipment and Method: Stylet Placement Confirmation: ETT inserted through vocal cords under direct vision,  positive ETCO2 and breath sounds checked- equal and bilateral Secured at: 21 cm Tube secured with: Tape Dental Injury: Teeth and Oropharynx as per pre-operative assessment

## 2019-11-04 NOTE — Transfer of Care (Signed)
Immediate Anesthesia Transfer of Care Note  Patient: Maliya Marich Bonet  Procedure(s) Performed: ESOPHAGOGASTRODUODENOSCOPY (EGD) WITH PROPOFOL (N/A )  Patient Location: PACU  Anesthesia Type:General  Level of Consciousness: awake, alert  and oriented  Airway & Oxygen Therapy: Patient connected to face mask oxygen  Post-op Assessment: Post -op Vital signs reviewed and stable  Post vital signs: stable  Last Vitals:  Vitals Value Taken Time  BP 115/62 11/04/19 1649  Temp    Pulse 95 11/04/19 1651  Resp 23 11/04/19 1651  SpO2 100 % 11/04/19 1651  Vitals shown include unvalidated device data.  Last Pain:  Vitals:   11/04/19 1547  TempSrc:   PainSc: 0-No pain         Complications: No complications documented.

## 2019-11-04 NOTE — ED Triage Notes (Signed)
Pt arrived via EMS from home where she woke this AM at 0200 and started coughing up blood. Pt has cirrhosis and has been taking BC powders and Asprin daily x1 week. Pt also drinks alcohol regularly.

## 2019-11-04 NOTE — Consult Note (Signed)
Brittany Minium, MD [facility]  [address], [address] [place], [place] [address] Phone: [phone] Fax : [phone]  Consultation  Referring Provider:     Dr. Joylene Igo Primary Care Physician:  Brittany Hopes, MD Primary Gastroenterologist: [facility]        Reason for Consultation:     Upper GI bleeding  Date of Admission:  11/04/2019 Date of Consultation:  11/04/2019         HPI:   [name] is a 54 y.o. female who has a history of esophageal varices with banding at [facility].  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  The patient states that she continues to drink socially. The patient has been seen regularly at [facility] and had banding back in December 2020.  The patient reports that after having the upper endoscopy with banding she had severe chest pain from the bands.  She now comes in with black stools for the last few days.  The patient has a history of hepatitis C and has been treated in the past.  She does have a diagnosis of liver cirrhosis.  The patient has a history of sleep apnea and came to the ER because of several episodes of hematemesis.  She states that she started throwing up blood approximately 2:00 in the morning.  The patient has been taking some BC powders and aspirin for a week for neck pain.  The patient has a history of portal hypertensive gastropathy.  The patient was admitted with a hemoglobin of 15.5 that was recorded at 4:30 in the morning and at 1:40 PM this afternoon it went down to 13.0.  She has had continued maroon and black stools.  The patient denies any abdominal pain.  She has a history of thrombocytopenia and her most recent platelets from today 145.   Past Medical History:  Diagnosis Date  . Bleeding disorder (HCC)   . Diabetes mellitus without complication (HCC)   . GI bleed   . Hepatitis C, acute   . Hypertension   . O2 dependent   . Sleep apnea     Past Surgical History:  Procedure Laterality Date  . ORIF TIBIA PLATEAU Left  07/02/2015   Procedure: OPEN REDUCTION INTERNAL FIXATION (ORIF) TIBIAL PLATEAU;  Surgeon: Juanell Fairly, MD;  Location: [facility] ORS;  Service: Orthopedics;  Laterality: Left;    Prior to Admission medications   Medication Sig Start Date End Date Taking? Authorizing Provider  cyclobenzaprine (FLEXERIL) 10 MG tablet Take 1 tablet by mouth daily in the afternoon. 08/10/19  Yes [provider]  diclofenac Sodium (VOLTAREN) 1 % GEL Apply 1 application topically every 6 (six) hours as needed. 07/06/19  Yes [provider]  escitalopram (LEXAPRO) 5 MG tablet Take 5 mg by mouth at bedtime. 09/10/19  Yes [provider]  fluticasone (FLONASE) 50 MCG/ACT nasal spray Place 2 sprays into both nostrils daily.   Yes [provider]  furosemide (LASIX) 20 MG tablet TAKE 1 TABLET BY MOUTH EVERY MORNING FOR FLUID RETENTION X 3 DAYS 08/14/16  Yes [provider]  insulin detemir (LEVEMIR) 100 UNIT/ML injection Inject 10-20 Units into the skin 2 (two) times daily. 20 units in morning and 10 units in evening (may add additional 20 units in evening depending on blood sugar)   Yes [provider]  lidocaine (XYLOCAINE) 5 % ointment APPLY 1 APPLICATION TOPICALLY TWO (2) TIMES A DAY. APPLY 5 GRAMS TO AREA UP TO TWICE DAILY 08/16/16  Yes [provider]  loratadine (CLARITIN) 10 MG tablet Take 10 mg by mouth daily. 07/02/19  Yes [provider]  losartan (COZAAR) 100 MG tablet Take 100 mg by mouth daily. 07/27/19  Yes [provider]  oxyCODONE (OXY IR/ROXICODONE) 5 MG immediate release tablet Take 1-2 tablets (5-10 mg total) by mouth every 3 (three) hours as needed for breakthrough pain. 07/04/15  Yes Juanell Fairly, MD  SPIRIVA RESPIMAT 2.5 MCG/ACT AERS Inhale 2 puffs into the lungs daily in the afternoon. 07/22/19  Yes [provider]  SUBOXONE 2-0.5 MG FILM Place 1 Film under the tongue 3 (three) times daily. 09/17/19  Yes [provider]  triamcinolone ointment (KENALOG) 0.5 % Apply 1 application topically 2 (two) times daily. 08/23/19  Yes [provider]  albuterol (PROVENTIL HFA;VENTOLIN HFA) 108 (90 Base) MCG/ACT inhaler Inhale into the lungs. 03/10/13   [provider]  enoxaparin (LOVENOX) 40 MG/0.4ML injection Inject 0.4 mLs (40 mg total) into the skin every 12 (twelve) hours. Patient not taking: Reported on 11/04/2019 07/04/15   Juanell Fairly, MD    History reviewed. No pertinent family history.   Social History   Tobacco Use  . Smoking status: Current Every Day Smoker    Packs/day: 0.25    Years: 36.00    Pack years: 9.00    Types: Cigarettes  . Smokeless tobacco: Never Used  Substance Use Topics  . Alcohol use: Yes    Alcohol/week: 0.0 standard drinks  . Drug use: Not on file    Allergies as of 11/04/2019  . (No Known Allergies)    Review of Systems:    All systems reviewed and negative except where noted in HPI.   Physical Exam:  Vital signs in last 24 hours: Temp:  [97 F (36.1 C)-99 F (37.2 C)] 97.9 F (36.6 C) (07/08 1646) Pulse Rate:  [73-105] 99 (07/08 1715) Resp:  [14-37] 22 (07/08 1715) BP: (75-132)/(42-84) 91/42 (07/08 1715) SpO2:  [90 %-100 %] 100 % (07/08 1715) Weight:  [109.8 kg] 109.8 kg (07/08 1547)   General:   Pleasant, cooperative in NAD Head:  Normocephalic and atraumatic. Eyes:   No icterus.   Conjunctiva pink. PERRLA. Ears:  Normal auditory acuity. Neck:  Supple; no masses or thyroidomegaly Lungs: Respirations even and unlabored. Lungs clear to auscultation bilaterally.   No wheezes, crackles, or rhonchi.  Heart:  Regular rate and rhythm;  Without murmur, clicks, rubs or gallops Abdomen:  Soft, nondistended, nontender. Normal bowel sounds. No appreciable masses or hepatomegaly.  No rebound or guarding.  Rectal:  Not performed. Msk:  Symmetrical without gross deformities.    Extremities:  Without edema, cyanosis or clubbing. Neurologic:  Alert and  oriented x3;  grossly normal neurologically. Skin:  Intact without significant lesions or rashes. Cervical Nodes:  No significant cervical adenopathy. Psych:  Alert and cooperative. Normal affect.  LAB RESULTS: Recent Labs    11/04/19 0435 11/04/19 1340  WBC 9.7 12.7*  HGB 15.5* 13.0  HCT 46.7* 38.5  PLT 145* 141*   BMET Recent Labs    11/04/19 0435  NA 136  K 3.6  CL 102  CO2 26  GLUCOSE 191*  BUN 17  CREATININE 1.21*  CALCIUM 8.1*   LFT Recent Labs    11/04/19 0435  PROT 6.3*  ALBUMIN 2.8*  AST 84*  ALT 83*  ALKPHOS 64  BILITOT 1.2  BILIDIR 0.3*  IBILI 0.9   PT/INR Recent Labs    11/04/19 1004  LABPROT 14.5  INR 1.2    STUDIES: DG Chest Portable 1 View  Result Date: 11/04/2019 CLINICAL DATA:  Vomiting. EXAM: PORTABLE CHEST 1 VIEW COMPARISON:  07/02/2015 FINDINGS: Cardiomediastinal contours and hilar structures are normal. Mild increased interstitial markings throughout the chest. No lobar level consolidation.  No sign of pleural effusion. On limited assessment visualized skeletal structures are unremarkable. IMPRESSION: Mild increased interstitial markings more likely chronic given the appearance of chest CT from 2016. No consolidation or effusion. Electronically Signed   By: Donzetta Kohut M.D.   On: 11/04/2019 10:28      Impression / Plan:   Assessment: Principal Problem:   Hematemesis Active Problems:   Diabetes mellitus without complication (HCC)   Hypertension   Hepatitis C, chronic (HCC)   Alcohol dependence (HCC)   Acute upper GI bleed   UGIB (upper gastrointestinal bleed)   Melena   Acute gastric ulcer with hemorrhage   [name] is a 55 y.o. y/o female with a history of cirrhosis from hepatitis C and alcohol dependence.  The patient came in with an upper GI bleed with vomiting blood.  The patient has a history of esophageal varices.  The patient denies any abdominal pain but has been taking some NSAIDs.  Plan:  This patient  has an upper GI bleed likely from NSAIDs versus variceal bleeding.  The patient will be set up for a urgent EGD.  The patient has been explained the plan agrees with it.  PPI IV twice daily  Continue serial CBCs and transfuse PRN Avoid NSAIDs Maintain 2 large-bore IV lines Please page GI with any acute hemodynamic changes, or signs of active GI bleeding  Thank you for involving me in the care of this patient.      LOS: 0 days   Brittany Minium, MD  11/04/2019, 5:28 PM Pager [phone] 7am-5pm  Check AMION for 5pm -7am coverage and on weekends   Note: This dictation was prepared with Dragon dictation along with smaller phrase technology. Any transcriptional errors that result from this process are unintentional.

## 2019-11-05 ENCOUNTER — Encounter: Payer: Self-pay | Admitting: Gastroenterology

## 2019-11-05 LAB — CBC WITH DIFFERENTIAL/PLATELET
Abs Immature Granulocytes: 0.08 10*3/uL — ABNORMAL HIGH (ref 0.00–0.07)
Basophils Absolute: 0 10*3/uL (ref 0.0–0.1)
Basophils Relative: 0 %
Eosinophils Absolute: 0 10*3/uL (ref 0.0–0.5)
Eosinophils Relative: 0 %
HCT: 36.2 % (ref 36.0–46.0)
Hemoglobin: 11.9 g/dL — ABNORMAL LOW (ref 12.0–15.0)
Immature Granulocytes: 1 %
Lymphocytes Relative: 8 %
Lymphs Abs: 0.9 10*3/uL (ref 0.7–4.0)
MCH: 32.9 pg (ref 26.0–34.0)
MCHC: 32.9 g/dL (ref 30.0–36.0)
MCV: 100 fL (ref 80.0–100.0)
Monocytes Absolute: 0.4 10*3/uL (ref 0.1–1.0)
Monocytes Relative: 4 %
Neutro Abs: 10.2 10*3/uL — ABNORMAL HIGH (ref 1.7–7.7)
Neutrophils Relative %: 87 %
Platelets: 138 10*3/uL — ABNORMAL LOW (ref 150–400)
RBC: 3.62 MIL/uL — ABNORMAL LOW (ref 3.87–5.11)
RDW: 14.2 % (ref 11.5–15.5)
WBC: 11.7 10*3/uL — ABNORMAL HIGH (ref 4.0–10.5)
nRBC: 0 % (ref 0.0–0.2)

## 2019-11-05 LAB — COMPREHENSIVE METABOLIC PANEL
ALT: 52 U/L — ABNORMAL HIGH (ref 0–44)
AST: 32 U/L (ref 15–41)
Albumin: 2.4 g/dL — ABNORMAL LOW (ref 3.5–5.0)
Alkaline Phosphatase: 40 U/L (ref 38–126)
Anion gap: 4 — ABNORMAL LOW (ref 5–15)
BUN: 35 mg/dL — ABNORMAL HIGH (ref 6–20)
CO2: 25 mmol/L (ref 22–32)
Calcium: 7.1 mg/dL — ABNORMAL LOW (ref 8.9–10.3)
Chloride: 112 mmol/L — ABNORMAL HIGH (ref 98–111)
Creatinine, Ser: 1.46 mg/dL — ABNORMAL HIGH (ref 0.44–1.00)
GFR calc Af Amer: 47 mL/min — ABNORMAL LOW (ref >60)
GFR calc non Af Amer: 40 mL/min — ABNORMAL LOW (ref >60)
Glucose, Bld: 221 mg/dL — ABNORMAL HIGH (ref 70–99)
Potassium: 4.7 mmol/L (ref 3.5–5.1)
Sodium: 141 mmol/L (ref 135–145)
Total Bilirubin: 0.6 mg/dL (ref 0.3–1.2)
Total Protein: 5.6 g/dL — ABNORMAL LOW (ref 6.5–8.1)

## 2019-11-05 LAB — GLUCOSE, CAPILLARY
Glucose-Capillary: 105 mg/dL — ABNORMAL HIGH (ref 70–99)
Glucose-Capillary: 143 mg/dL — ABNORMAL HIGH (ref 70–99)
Glucose-Capillary: 158 mg/dL — ABNORMAL HIGH (ref 70–99)
Glucose-Capillary: 166 mg/dL — ABNORMAL HIGH (ref 70–99)
Glucose-Capillary: 175 mg/dL — ABNORMAL HIGH (ref 70–99)
Glucose-Capillary: 194 mg/dL — ABNORMAL HIGH (ref 70–99)
Glucose-Capillary: 246 mg/dL — ABNORMAL HIGH (ref 70–99)

## 2019-11-05 LAB — HEMOGLOBIN: Hemoglobin: 11.5 g/dL — ABNORMAL LOW (ref 12.0–15.0)

## 2019-11-05 LAB — PROCALCITONIN: Procalcitonin: 0.14 ng/mL

## 2019-11-05 MED ORDER — SODIUM CHLORIDE 0.9 % IV BOLUS
500.0000 mL | Freq: Once | INTRAVENOUS | Status: AC
  Administered 2019-11-05: 500 mL via INTRAVENOUS

## 2019-11-05 MED ORDER — TRAMADOL HCL 50 MG PO TABS
50.0000 mg | ORAL_TABLET | Freq: Four times a day (QID) | ORAL | Status: DC | PRN
  Administered 2019-11-05: 50 mg via ORAL
  Filled 2019-11-05: qty 1

## 2019-11-05 MED ORDER — CHLORHEXIDINE GLUCONATE CLOTH 2 % EX PADS
6.0000 | MEDICATED_PAD | Freq: Every day | CUTANEOUS | Status: DC
  Administered 2019-11-05 – 2019-11-08 (×2): 6 via TOPICAL

## 2019-11-05 MED ORDER — SODIUM CHLORIDE 0.9 % IV SOLN
INTRAVENOUS | Status: DC | PRN
  Administered 2019-11-05: 250 mL via INTRAVENOUS

## 2019-11-05 NOTE — Progress Notes (Signed)
CH visited pt. per OR for prayer.  Pt. lying back in bed, awake and amenable to [facility] visit.  Pt. shared that she was experiencing bloody vomit and stool and called EMS.  Pt. has several health issues, including cirrhosis of liver and a respiratory disease that is being treated at [facility], pt. shared.  Pt. shared feeling 'stressed' by the weight of caring for her husband and brother; husband is [place] [employer] vet who has developed dementia --> pt. says he often thinks he is back in [place]; dementia affects his sense of direction; husband drinks significant amount of beer as well, pt. says.  Pt.'s brother 'lost the will to live' after developing severe life-limiting illness; pt. cares for him as she feels it is her role as oldest sister,  but shared feeling overwhelmed by this.  Pt.'s dtr. sometimes helps but overall pt. feels that when she is discharged 'I'm going to have to make some decisions' about these caring responsibilities she has assumed.  Pt. says her faith has helped her get through difficult times in the past, and she shared several examples of God's provision for her.   Pt. appears to be well supported by her medical team (she spoke positively of team here at [facility] and at [facility] where she is usually treated); pt.'s stress from caring roles at home seem to be contributing to her health issues.  CH provided prayer; pt. asked if another chaplain can come this evening --> CH made referral to [facility].    11/05/19 1245  Clinical Encounter Type  Visited With Patient  Visit Type Initial;Psychological support;Spiritual support;Social support;Critical Care  Referral From Nurse;Patient  Consult/Referral To Chaplain  Spiritual Encounters  Spiritual Needs Emotional;Prayer  Stress Factors  Patient Stress Factors Financial concerns;Family relationships;Lack of knowledge;Health changes;Loss of control;Major life changes

## 2019-11-05 NOTE — Plan of Care (Signed)

## 2019-11-05 NOTE — Progress Notes (Signed)
Brittany Minium, MD [facility]   [address] [place], [place] [address] Phone: [phone] Fax : [phone]   Subjective: The patient reports that she is feeling well today.  There has been no sign of any lower GI bleeding.  The patient's hemoglobin has decreased slightly with her hemoglobin of 11.9 this morning.  The patient had banding of her esophageal varices with signs of recent bleeding.  The patient also had AVMs in the stomach that were cauterized as was a spot in the duodenum.   Objective: Vital signs in last 24 hours: Vitals:   11/05/19 1000 11/05/19 1100 11/05/19 1200 11/05/19 1300  BP: 94/66 98/62 130/68 (!) 103/59  Pulse: 77 76 85 82  Resp: 17 20 17 18   Temp:   97.8 F (36.6 C)   TempSrc:   Oral   SpO2: 98% 98% 97% 100%  Weight:      Height:       Weight change:   Intake/Output Summary (Last 24 hours) at 11/05/2019 1456 Last data filed at 11/05/2019 1209 Gross per 24 hour  Intake 1567.88 ml  Output 1800 ml  Net -232.12 ml     Exam: Heart:: Regular rate and rhythm, S1S2 present or without murmur or extra heart sounds Lungs: normal and clear to auscultation and percussion Abdomen: soft, nontender, normal bowel sounds   Lab Results: @LABTEST2 @ Micro Results: Recent Results (from the past 240 hour(s))  SARS Coronavirus 2 by RT PCR (hospital order, performed in [facility] hospital lab) Nasopharyngeal Nasopharyngeal Swab     Status: None   Collection Time: 11/04/19 11:21 AM   Specimen: Nasopharyngeal Swab  Result Value Ref Range Status   SARS Coronavirus 2 NEGATIVE NEGATIVE Final    Comment: (NOTE) SARS-CoV-2 target nucleic acids are NOT DETECTED.  The SARS-CoV-2 RNA is generally detectable in upper and lower respiratory specimens during the acute phase of infection. The lowest concentration of SARS-CoV-2 viral copies this assay can detect is 250 copies / mL. A negative result does not preclude SARS-CoV-2 infection and should not be used as the sole  basis for treatment or other patient management decisions.  A negative result may occur with improper specimen collection / handling, submission of specimen other than nasopharyngeal swab, presence of viral mutation(s) within the areas targeted by this assay, and inadequate number of viral copies (<250 copies / mL). A negative result must be combined with clinical observations, patient history, and epidemiological information.  Fact Sheet for Patients:   [facility]  Fact Sheet for Healthcare Providers: 01/05/20  This test is not yet approved or  cleared by the [employer] FDA and has been authorized for detection and/or diagnosis of SARS-CoV-2 by FDA under an Emergency Use Authorization (EUA).  This EUA will remain in effect (meaning this test can be used) for the duration of the COVID-19 declaration under Section 564(b)(1) of the Act, 21 U.S.C. section 360bbb-3(b)(1), unless the authorization is terminated or revoked sooner.  Performed at [facility], [address] [place], [address] [place]   MRSA PCR Screening     Status: None   Collection Time: 11/04/19  5:52 PM   Specimen: Nasopharyngeal  Result Value Ref Range Status   MRSA by PCR NEGATIVE NEGATIVE Final    Comment:        The GeneXpert MRSA Assay (FDA approved for NASAL specimens only), is one component of a comprehensive MRSA colonization surveillance program. It is not intended to diagnose MRSA infection nor to guide or monitor  treatment for MRSA infections. Performed at [facility], [address] [place], [place] [address]    Studies/Results: DG Chest Portable 1 View  Result Date: 11/04/2019 CLINICAL DATA:  Vomiting. EXAM: PORTABLE CHEST 1 VIEW COMPARISON:  07/02/2015 FINDINGS: Cardiomediastinal contours and hilar structures are normal. Mild increased interstitial markings throughout the chest. No lobar level  consolidation.  No sign of pleural effusion. On limited assessment visualized skeletal structures are unremarkable. IMPRESSION: Mild increased interstitial markings more likely chronic given the appearance of chest CT from 2016. No consolidation or effusion. Electronically Signed   By: Donzetta Kohut M.D.   On: 11/04/2019 10:28   Medications: I have reviewed the patient's current medications. Scheduled Meds: . Chlorhexidine Gluconate Cloth  6 each Topical Daily  . fluticasone  2 spray Each Nare Daily  . insulin aspart  0-15 Units Subcutaneous Q4H  . mometasone-formoterol  2 puff Inhalation BID  . [START ON 11/07/2019] pantoprazole  40 mg Intravenous Q12H  . tiotropium  18 mcg Inhalation Daily   Continuous Infusions: . sodium chloride 10 mL/hr at 11/05/19 1200  . cefTRIAXone (ROCEPHIN)  IV Stopped (11/05/19 1043)  . octreotide  (SANDOSTATIN)    IV infusion 50 mcg/hr (11/05/19 1200)  . pantoprozole (PROTONIX) infusion 8 mg/hr (11/05/19 1200)   PRN Meds:.sodium chloride, lidocaine, LORazepam **OR** LORazepam, ondansetron **OR** ondansetron (ZOFRAN) IV   Assessment: Principal Problem:   Hematemesis Active Problems:   Diabetes mellitus without complication (HCC)   Hypertension   Hepatitis C, chronic (HCC)   Alcohol dependence (HCC)   Acute upper GI bleed   UGIB (upper gastrointestinal bleed)   Melena   Acute gastric ulcer with hemorrhage    Plan: The patient is stable and has had a clear liquid diet.  The patient can be advanced to a soft diet tomorrow.  There is no further sign of bleeding and the esophageal varices were banded.  Would suggest continued supportive care with a follow-up after discharge with GI.   LOS: 1 day   [name] 11/05/2019, 2:56 PM Pager [phone] 7am-5pm  Check AMION for 5pm -7am coverage and on weekends

## 2019-11-05 NOTE — Progress Notes (Signed)
CRITICAL CARE PROGRESS NOTE    Name: [name] MRN: [id number] DOB: [birth year]     LOS: 1   SUBJECTIVE FINDINGS & SIGNIFICANT EVENTS    Patient description:  26F hx of HCV s/p tx, Liver cirrohsis, DM, HTN, OSA, obesity ,portal hypertension and esophageal varices,  alcoholism reports drinking shots of whiskey, active smoker currently reports 1/2 pack previosly at 2ppd, she complained of feeling weak and febrile for appx 1 week.  She further declined and reports having intermittent NVD with blood per os and rectum.  In ED she was found to be hypotensive and altered she received IVF with some improvement in vitals however required levophed for persistent hypotension with consult to PCCM for further evaluation and mgt.     11/05/19- patient clinically improved, off pressor support. Eating asking for solid food. No overnight events.     Lines/tubes :   Microbiology/Sepsis markers: Results for orders placed or performed during the hospital encounter of 11/04/19  SARS Coronavirus 2 by RT PCR (hospital order, performed in [facility] hospital lab) Nasopharyngeal Nasopharyngeal Swab     Status: None   Collection Time: 11/04/19 11:21 AM   Specimen: Nasopharyngeal Swab  Result Value Ref Range Status   SARS Coronavirus 2 NEGATIVE NEGATIVE Final    Comment: (NOTE) SARS-CoV-2 target nucleic acids are NOT DETECTED.  The SARS-CoV-2 RNA is generally detectable in upper and lower respiratory specimens during the acute phase of infection. The lowest concentration of SARS-CoV-2 viral copies this assay can detect is 250 copies / mL. A negative result does not preclude SARS-CoV-2 infection and should not be used as the sole basis for treatment or other patient management decisions.  A negative result may occur  with improper specimen collection / handling, submission of specimen other than nasopharyngeal swab, presence of viral mutation(s) within the areas targeted by this assay, and inadequate number of viral copies (<250 copies / mL). A negative result must be combined with clinical observations, patient history, and epidemiological information.  Fact Sheet for Patients:   [employer]  Fact Sheet for Healthcare Providers: https://pope.com/  This test is not yet approved or  cleared by the [place] FDA and has been authorized for detection and/or diagnosis of SARS-CoV-2 by FDA under an Emergency Use Authorization (EUA).  This EUA will remain in effect (meaning this test can be used) for the duration of the COVID-19 declaration under Section 564(b)(1) of the Act, 21 U.S.C. section 360bbb-3(b)(1), unless the authorization is terminated or revoked sooner.  Performed at [facility], [address] [place], [place] [address]   MRSA PCR Screening     Status: None   Collection Time: 11/04/19  5:52 PM   Specimen: Nasopharyngeal  Result Value Ref Range Status   MRSA by PCR NEGATIVE NEGATIVE Final    Comment:        The GeneXpert MRSA Assay (FDA approved for NASAL specimens only), is one component of a comprehensive MRSA colonization surveillance program. It is not intended to diagnose MRSA infection nor to guide or monitor treatment for MRSA infections. Performed at [facility], [address] [place], [place] [address]     Anti-infectives:  Anti-infectives (From admission, onward)   Start     Dose/Rate Route Frequency Ordered Stop   11/05/19 1000  cefTRIAXone (ROCEPHIN) 2 g in sodium chloride 0.9 % 100 mL IVPB     Discontinue     2 g 200 mL/hr over 30 Minutes Intravenous Every 24 hours  11/04/19 2219     11/04/19 1645  cefTRIAXone (ROCEPHIN) 1 g in sodium chloride 0.9 % 100 mL IVPB  Status:   Discontinued        1 g 200 mL/hr over 30 Minutes Intravenous  Once 11/04/19 1641 11/04/19 1824   11/04/19 1000  cefTRIAXone (ROCEPHIN) 1 g in sodium chloride 0.9 % 100 mL IVPB        1 g 200 mL/hr over 30 Minutes Intravenous  Once 11/04/19 0959 11/04/19 1513       Consults: Treatment Team:  [name], MD     Tests / Events:    PORTABLE CHEST 1 VIEW  COMPARISON:  07/02/2015  FINDINGS: Cardiomediastinal contours and hilar structures are normal.  Mild increased interstitial markings throughout the chest.  No lobar level consolidation.  No sign of pleural effusion.  On limited assessment visualized skeletal structures are unremarkable.  IMPRESSION: Mild increased interstitial markings more likely chronic given the appearance of chest CT from 2016. No consolidation or effusion.   Electronically Signed   By: Donzetta Kohut M.D.   On: 11/04/2019 10:28   PAST MEDICAL HISTORY   Past Medical History:  Diagnosis Date  . Bleeding disorder (HCC)   . Diabetes mellitus without complication (HCC)   . GI bleed   . Hepatitis C, acute   . Hypertension   . O2 dependent   . Sleep apnea      SURGICAL HISTORY   Past Surgical History:  Procedure Laterality Date  . ESOPHAGOGASTRODUODENOSCOPY (EGD) WITH PROPOFOL N/A 11/04/2019   Procedure: ESOPHAGOGASTRODUODENOSCOPY (EGD) WITH PROPOFOL;  Surgeon: [name], MD;  Location: [facility];  Service: Endoscopy;  Laterality: N/A;  . ORIF TIBIA PLATEAU Left 07/02/2015   Procedure: OPEN REDUCTION INTERNAL FIXATION (ORIF) TIBIAL PLATEAU;  Surgeon: Juanell Fairly, MD;  Location: [facility] ORS;  Service: Orthopedics;  Laterality: Left;     FAMILY HISTORY   History reviewed. No pertinent family history.   SOCIAL HISTORY   Social History   Tobacco Use  . Smoking status: Current Every Day Smoker    Packs/day: 0.25    Years: 36.00    Pack years: 9.00    Types: Cigarettes  . Smokeless tobacco: Never Used   Substance Use Topics  . Alcohol use: Yes    Alcohol/week: 0.0 standard drinks  . Drug use: Not on file     MEDICATIONS   Current Medication:  Current Facility-Administered Medications:  .  cefTRIAXone (ROCEPHIN) 2 g in sodium chloride 0.9 % 100 mL IVPB, 2 g, Intravenous, Q24H, Judithe Modest, NP .  Chlorhexidine Gluconate Cloth 2 % PADS 6 each, 6 each, Topical, Daily, Agbata, Tochukwu, MD .  fluticasone (FLONASE) 50 MCG/ACT nasal spray 2 spray, 2 spray, Each Nare, Daily, Agbata, Tochukwu, MD .  insulin aspart (novoLOG) injection 0-15 Units, 0-15 Units, Subcutaneous, Q4H, Agbata, Tochukwu, MD, 2 Units at 11/05/19 2956 .  lidocaine (XYLOCAINE) 2 % viscous mouth solution 15 mL, 15 mL, Mouth/Throat, Q3H PRN, [name], MD, 15 mL at 11/04/19 1724 .  LORazepam (ATIVAN) tablet 1-4 mg, 1-4 mg, Oral, Q1H PRN **OR** LORazepam (ATIVAN) injection 1-4 mg, 1-4 mg, Intravenous, Q1H PRN, Agbata, Tochukwu, MD .  mometasone-formoterol (DULERA) 200-5 MCG/ACT inhaler 2 puff, 2 puff, Inhalation, BID, Agbata, Tochukwu, MD, 2 puff at 11/05/19 2130 .  octreotide (SANDOSTATIN) 500 mcg in sodium chloride 0.9 % 250 mL (2 mcg/mL) infusion, 50 mcg/hr, Intravenous, Continuous, Willy Eddy, MD, Last Rate: 25 mL/hr at 11/05/19 0800, 50 mcg/hr at 11/05/19  0800 .  ondansetron (ZOFRAN) tablet 4 mg, 4 mg, Oral, Q6H PRN **OR** ondansetron (ZOFRAN) injection 4 mg, 4 mg, Intravenous, Q6H PRN, Agbata, Tochukwu, MD .  pantoprazole (PROTONIX) 80 mg in sodium chloride 0.9 % 100 mL (0.8 mg/mL) infusion, 8 mg/hr, Intravenous, Continuous, Willy Eddyobinson, Patrick, MD, Last Rate: 10 mL/hr at 11/05/19 0800, 8 mg/hr at 11/05/19 0800 .  [START ON 11/07/2019] pantoprazole (PROTONIX) injection 40 mg, 40 mg, Intravenous, Q12H, Willy Eddyobinson, Patrick, MD .  tiotropium [facility](SPIRIVA) inhalation capsule [facility] use ONLY) 18 mcg, 18 mcg, Inhalation, Daily, Agbata, Tochukwu, MD, 18 mcg at 11/05/19 [id number]    ALLERGIES   Patient has no known  allergies.    REVIEW OF SYSTEMS     10 point ROS is negative except for generalized malaise  PHYSICAL EXAMINATION   Vital Signs: Temp:  [97 F (36.1 C)-99.1 F (37.3 C)] 97.8 F (36.6 C) (07/09 0800) Pulse Rate:  [67-102] 75 (07/09 0900) Resp:  [14-37] 18 (07/09 0900) BP: (75-115)/(42-84) 85/54 (07/09 0900) SpO2:  [90 %-100 %] 100 % (07/09 0900) Weight:  [109.8 kg] 109.8 kg (07/08 1547)  GENERAL:age appropriate chronically ill appearing HEAD: Normocephalic, atraumatic.  EYES: Pupils equal, round, reactive to light.  No scleral icterus.  MOUTH: Moist mucosal membrane. NECK: Supple. No thyromegaly. No nodules. No JVD.  PULMONARY: mild rhonchi bilaterally  CARDIOVASCULAR: S1 and S2. Regular rate and rhythm. No murmurs, rubs, or gallops.  GASTROINTESTINAL: Soft, nontender, non-distended. No masses. Positive bowel sounds. No hepatosplenomegaly.  MUSCULOSKELETAL: No swelling, clubbing, or edema.  NEUROLOGIC: Mild distress due to acute illness SKIN:intact,warm,dry   PERTINENT DATA     Infusions: . cefTRIAXone (ROCEPHIN)  IV    . octreotide  (SANDOSTATIN)    IV infusion 50 mcg/hr (11/05/19 0800)  . pantoprozole (PROTONIX) infusion 8 mg/hr (11/05/19 0800)   Scheduled Medications: . Chlorhexidine Gluconate Cloth  6 each Topical Daily  . fluticasone  2 spray Each Nare Daily  . insulin aspart  0-15 Units Subcutaneous Q4H  . mometasone-formoterol  2 puff Inhalation BID  . [START ON 11/07/2019] pantoprazole  40 mg Intravenous Q12H  . tiotropium  18 mcg Inhalation Daily   PRN Medications: lidocaine, LORazepam **OR** LORazepam, ondansetron **OR** ondansetron (ZOFRAN) IV Hemodynamic parameters:   Intake/Output: 07/08 0701 - 07/09 0700 In: 2218.6 [I.V.:1118.6; IV Piggyback:1100] Out: 700 [Urine:700]  Ventilator  Settings:      LAB RESULTS:  Basic Metabolic Panel: Recent Labs  Lab 11/04/19 0435 11/04/19 1340 11/05/19 0451  NA 136  --  141  K 3.6  --  4.7  CL  102  --  112*  CO2 26  --  25  GLUCOSE 191*  --  221*  BUN 17  --  35*  CREATININE 1.21*  --  1.46*  CALCIUM 8.1*  --  7.1*  MG  --  1.7  --   PHOS  --  5.2*  --    Liver Function Tests: Recent Labs  Lab 11/04/19 0435 11/05/19 0451  AST 84* 32  ALT 83* 52*  ALKPHOS 64 40  BILITOT 1.2 0.6  PROT 6.3* 5.6*  ALBUMIN 2.8* 2.4*   Recent Labs  Lab 11/04/19 0435  LIPASE 65*   No results for input(s): AMMONIA in the last 168 hours. CBC: Recent Labs  Lab 11/04/19 0435 11/04/19 1340 11/04/19 1852 11/05/19 0451  WBC 9.7 12.7*  --  11.7*  NEUTROABS  --   --   --  10.2*  HGB 15.5* 13.0 14.4 11.9*  HCT  46.7* 38.5  --  36.2  MCV 96.7 95.8  --  100.0  PLT 145* 141*  --  138*   Cardiac Enzymes: No results for input(s): CKTOTAL, CKMB, CKMBINDEX, TROPONINI in the last 168 hours. BNP: Invalid input(s): POCBNP CBG: Recent Labs  Lab 11/04/19 1745 11/04/19 1946 11/04/19 2347 11/05/19 0334 11/05/19 0803  GLUCAP 110* 143* 253* 246* 143*       IMAGING RESULTS:  Imaging: DG Chest Portable 1 View  Result Date: 11/04/2019 CLINICAL DATA:  Vomiting. EXAM: PORTABLE CHEST 1 VIEW COMPARISON:  07/02/2015 FINDINGS: Cardiomediastinal contours and hilar structures are normal. Mild increased interstitial markings throughout the chest. No lobar level consolidation.  No sign of pleural effusion. On limited assessment visualized skeletal structures are unremarkable. IMPRESSION: Mild increased interstitial markings more likely chronic given the appearance of chest CT from 2016. No consolidation or effusion. Electronically Signed   By: Donzetta Kohut M.D.   On: 11/04/2019 10:28   @PROBHOSP @ DG Chest Portable 1 View  Result Date: 11/04/2019 CLINICAL DATA:  Vomiting. EXAM: PORTABLE CHEST 1 VIEW COMPARISON:  07/02/2015 FINDINGS: Cardiomediastinal contours and hilar structures are normal. Mild increased interstitial markings throughout the chest. No lobar level consolidation.  No sign of pleural  effusion. On limited assessment visualized skeletal structures are unremarkable. IMPRESSION: Mild increased interstitial markings more likely chronic given the appearance of chest CT from 2016. No consolidation or effusion. Electronically Signed   By: 2017 M.D.   On: 11/04/2019 10:28     ASSESSMENT AND PLAN    -Multidisciplinary rounds held today  Upper GI bleed   - had small episode of rectal melena today 11/05/19 -s/p IVF and protonix gtt  -s/p EGD - esoph varices banded , gastritis   - patient reports taking high dose NSAID in past 1 week  -GI on case appreciate input -variceal bleed - octreotide/protonix   Interstitial lung disease    - supportive care    - IS at bedside    - duoneb   -flutter valve   -life long smoker   -nicotine replacement   Profound hypotension -resolved   - possible due to volume depletion post hematemesis and melena vs sepsis related from SBP or GI source of infection    - septic workup    - procalcitonin   -paracentesis   - empiric abx   - IVF LR 100/h    - h/h q6h    -continue rocephin     -nasal MRSA pcr    Alcoholism   - CIWA protocol     - s/p banana bag- Monitor vitals and withrdawal symptoms    OSA with OHS overlap   - likely will need extubation to BIPAP   ID -continue IV abx as prescibed -follow up cultures  GI/Nutrition GI PROPHYLAXIS as indicated DIET-->TF's as tolerated Constipation protocol as indicated  ENDO - ICU hypoglycemic\Hyperglycemia protocol -check FSBS per protocol   ELECTROLYTES -follow labs as needed -replace as needed -pharmacy consultation   DVT/GI PRX ordered -SCDs  TRANSFUSIONS AS NEEDED MONITOR FSBS ASSESS the need for LABS as needed   Critical care provider statement:    Critical care time (minutes):  33   Critical care time was exclusive of:  Separately billable procedures and treating other patients   Critical care was necessary to treat or prevent imminent or  life-threatening deterioration of the following conditions:  profound hypotension, liver cirrosis, upper gi bleed, morbid obesity, OSA, ILD, multiple comoribid conditions   Critical care was  time spent personally by me on the following activities:  Development of treatment plan with patient or surrogate, discussions with consultants, evaluation of patient's response to treatment, examination of patient, obtaining history from patient or surrogate, ordering and performing treatments and interventions, ordering and review of laboratory studies and re-evaluation of patient's condition.  I assumed direction of critical care for this patient from another provider in my specialty: no    This document was prepared using Dragon voice recognition software and may include unintentional dictation errors.    Ottie Glazier, M.D.  Division of [place]

## 2019-11-05 NOTE — Progress Notes (Signed)
PROGRESS NOTE    [name]    Code Status: Full Code  [id number] DOB: [birth year] DOA: 11/04/2019 LOS: 1 days  PCP: Hyman Hopes, MD CC:  Chief Complaint  Patient presents with  . Hematemesis       Hospital Summary   This is a 54 year old female with a history of cirrhosis, diabetes, HCV treated, HTN, OSA, alcohol use, who presented to the ED for evaluation of several episodes of hematemesis. Noted to have been taking BC powders and aspirin daily x 1 week for neck pain. Also had 2 episodes of maroon colored stools prior to admission.   ED Course:  Hb 15.5, started on Rocephin, IV protonix, and octreotide. GI was consulted Hypotensive and altered, she received IVF with some improvement however required levophed for persistent hypotension and PCCM was consulted and she was admitted to the stepdown unit  7/8: EGD with Dr. Servando Snare - Grade III esophageal varices, incompletely eradicated, banded. 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   7/9: off pressors and doing well  A & P   Principal Problem:   Hematemesis Active Problems:   Diabetes mellitus without complication (HCC)   Hypertension   Hepatitis C, chronic (HCC)   Alcohol dependence (HCC)   Acute upper GI bleed   UGIB (upper gastrointestinal bleed)   Melena   Acute gastric ulcer with hemorrhage   1. UGI bleed s/p EGD 7.8 with esophageal variceal banding with Dr. Servando Snare a. Doing well b. Possibly from NSAID use and cirrhosis c. Melena from UGI bleed d. CLD, advance to soft tomorrow e. Outpatient GI follow up f. protonix  2. Hypotension, likely from volume depletion post hematemesis and melena vs. Sepsis related from SBP or GI infection, resolved a. Afebrile and without leukocytosis b. Paracentesis recommended by PCCM but I dont see an order. No abdominal pain on exam c. Continue ceftriaxone for  prophylaxis d. Octreotide e. Hold antihypertensives  3. Diabetes a. Continue sliding scale  4. Transaminitis  a. Likely from alcohol, unlikely from HCV as this has been reportedly treated b. CIWA protocol  5. Hypertension a. Holding antihypertensives for hypotension now   DVT prophylaxis: SCDs Start: 11/04/19 1105   Family Communication: No family at bedside  Disposition Plan:  Status is: Inpatient  Remains inpatient appropriate because:IV treatments appropriate due to intensity of illness or inability to take PO and Inpatient level of care appropriate due to severity of illness   Dispo: The patient is from: Home              Anticipated d/c is to: Home               Anticipated d/c date is: 2 days              Patient currently is not medically stable to d/c.          Pressure injury documentation    None  Consultants  GI PCCM   Procedures  EGD 7/8   Antibiotics   Anti-infectives (From admission, onward)   Start     Dose/Rate Route Frequency Ordered Stop   11/05/19 1000  cefTRIAXone (ROCEPHIN) 2 g in sodium chloride 0.9 % 100 mL IVPB     Discontinue     2 g 200 mL/hr over 30 Minutes Intravenous Every 24 hours 11/04/19 2219     11/04/19 1645  cefTRIAXone (ROCEPHIN) 1 g  in sodium chloride 0.9 % 100 mL IVPB  Status:  Discontinued        1 g 200 mL/hr over 30 Minutes Intravenous  Once 11/04/19 1641 11/04/19 1824   11/04/19 1000  cefTRIAXone (ROCEPHIN) 1 g in sodium chloride 0.9 % 100 mL IVPB        1 g 200 mL/hr over 30 Minutes Intravenous  Once 11/04/19 0959 11/04/19 1513        Subjective   Patient seen and examined at bedside in no acute distress and resting comfortably. No acute events overnight. Denies any acute complaints at this time.  No further episodes of hematemesis or melena  Objective   Vitals:   11/05/19 1300 11/05/19 1400 11/05/19 1500 11/05/19 1600  BP: (!) 103/59 99/65 104/70 107/70  Pulse: 82 84 83 84  Resp: 18 (!) 29 18 18     Temp:      TempSrc:      SpO2: 100% 96% 92% 99%  Weight:      Height:        Intake/Output Summary (Last 24 hours) at 11/05/2019 1723 Last data filed at 11/05/2019 1600 Gross per 24 hour  Intake 1144.93 ml  Output 1800 ml  Net -655.07 ml   Filed Weights   11/04/19 1547  Weight: 109.8 kg    Examination:  Physical Exam Vitals and nursing note reviewed.  Constitutional:      Appearance: Normal appearance.  HENT:     Head: Normocephalic and atraumatic.  Eyes:     Conjunctiva/sclera: Conjunctivae normal.  Cardiovascular:     Rate and Rhythm: Normal rate and regular rhythm.  Pulmonary:     Effort: Pulmonary effort is normal.     Breath sounds: Normal breath sounds.  Abdominal:     General: Abdomen is flat.     Palpations: Abdomen is soft.     Comments: Mild epigastric tenderness to palpation  Musculoskeletal:        General: No swelling or tenderness.  Skin:    Coloration: Skin is not jaundiced or pale.  Neurological:     Mental Status: She is alert. Mental status is at baseline.  Psychiatric:        Mood and Affect: Mood normal.        Behavior: Behavior normal.     Data Reviewed: I have personally reviewed following labs and imaging studies  CBC: Recent Labs  Lab 11/04/19 0435 11/04/19 1340 11/04/19 1852 11/05/19 0451 11/05/19 1657  WBC 9.7 12.7*  --  11.7*  --   NEUTROABS  --   --   --  10.2*  --   HGB 15.5* 13.0 14.4 11.9* 11.5*  HCT 46.7* 38.5  --  36.2  --   MCV 96.7 95.8  --  100.0  --   PLT 145* 141*  --  138*  --    Basic Metabolic Panel: Recent Labs  Lab 11/04/19 0435 11/04/19 1340 11/05/19 0451  NA 136  --  141  K 3.6  --  4.7  CL 102  --  112*  CO2 26  --  25  GLUCOSE 191*  --  221*  BUN 17  --  35*  CREATININE 1.21*  --  1.46*  CALCIUM 8.1*  --  7.1*  MG  --  1.7  --   PHOS  --  5.2*  --    GFR: Estimated Creatinine Clearance: 52.4 mL/min (A) (by C-G formula based on SCr of 1.46 mg/dL (H)). Liver  Function Tests: Recent Labs   Lab 11/04/19 0435 11/05/19 0451  AST 84* 32  ALT 83* 52*  ALKPHOS 64 40  BILITOT 1.2 0.6  PROT 6.3* 5.6*  ALBUMIN 2.8* 2.4*   Recent Labs  Lab 11/04/19 0435  LIPASE 65*   No results for input(s): AMMONIA in the last 168 hours. Coagulation Profile: Recent Labs  Lab 11/04/19 1004  INR 1.2   Cardiac Enzymes: No results for input(s): CKTOTAL, CKMB, CKMBINDEX, TROPONINI in the last 168 hours. BNP (last 3 results) No results for input(s): PROBNP in the last 8760 hours. HbA1C: Recent Labs    11/04/19 1340  HGBA1C 6.0*   CBG: Recent Labs  Lab 11/04/19 2347 11/05/19 0334 11/05/19 0803 11/05/19 1142 11/05/19 1607  GLUCAP 253* 246* 143* 158* 166*   Lipid Profile: No results for input(s): CHOL, HDL, LDLCALC, TRIG, CHOLHDL, LDLDIRECT in the last 72 hours. Thyroid Function Tests: No results for input(s): TSH, T4TOTAL, FREET4, T3FREE, THYROIDAB in the last 72 hours. Anemia Panel: No results for input(s): VITAMINB12, FOLATE, FERRITIN, TIBC, IRON, RETICCTPCT in the last 72 hours. Sepsis Labs: Recent Labs  Lab 11/04/19 1852 11/05/19 0451  PROCALCITON 0.24 0.14    Recent Results (from the past 240 hour(s))  SARS Coronavirus 2 by RT PCR (hospital order, performed in [facility] hospital lab) Nasopharyngeal Nasopharyngeal Swab     Status: None   Collection Time: 11/04/19 11:21 AM   Specimen: Nasopharyngeal Swab  Result Value Ref Range Status   SARS Coronavirus 2 NEGATIVE NEGATIVE Final    Comment: (NOTE) SARS-CoV-2 target nucleic acids are NOT DETECTED.  The SARS-CoV-2 RNA is generally detectable in upper and lower respiratory specimens during the acute phase of infection. The lowest concentration of SARS-CoV-2 viral copies this assay can detect is 250 copies / mL. A negative result does not preclude SARS-CoV-2 infection and should not be used as the sole basis for treatment or other patient management decisions.  A negative result may occur with improper specimen  collection / handling, submission of specimen other than nasopharyngeal swab, presence of viral mutation(s) within the areas targeted by this assay, and inadequate number of viral copies (<250 copies / mL). A negative result must be combined with clinical observations, patient history, and epidemiological information.  Fact Sheet for Patients:   BoilerBrush.com.cy  Fact Sheet for Healthcare Providers: https://pope.com/  This test is not yet approved or  cleared by the [place] FDA and has been authorized for detection and/or diagnosis of SARS-CoV-2 by FDA under an Emergency Use Authorization (EUA).  This EUA will remain in effect (meaning this test can be used) for the duration of the COVID-19 declaration under Section 564(b)(1) of the Act, 21 U.S.C. section 360bbb-3(b)(1), unless the authorization is terminated or revoked sooner.  Performed at [facility], [address] [place], [place] [address]   MRSA PCR Screening     Status: None   Collection Time: 11/04/19  5:52 PM   Specimen: Nasopharyngeal  Result Value Ref Range Status   MRSA by PCR NEGATIVE NEGATIVE Final    Comment:        The GeneXpert MRSA Assay (FDA approved for NASAL specimens only), is one component of a comprehensive MRSA colonization surveillance program. It is not intended to diagnose MRSA infection nor to guide or monitor treatment for MRSA infections. Performed at [facility], [address] [place], [place] [address]          Radiology Studies: DG Chest Portable 1 View  Result Date: 11/04/2019 CLINICAL DATA:  Vomiting. EXAM: PORTABLE CHEST 1 VIEW COMPARISON:  07/02/2015 FINDINGS: Cardiomediastinal contours and hilar structures are normal. Mild increased interstitial markings throughout the chest. No lobar level consolidation.  No sign of pleural effusion. On limited assessment visualized skeletal structures are  unremarkable. IMPRESSION: Mild increased interstitial markings more likely chronic given the appearance of chest CT from 2016. No consolidation or effusion. Electronically Signed   By: Donzetta KohutGeoffrey  Wile M.D.   On: 11/04/2019 10:28        Scheduled Meds: . Chlorhexidine Gluconate Cloth  6 each Topical Daily  . fluticasone  2 spray Each Nare Daily  . insulin aspart  0-15 Units Subcutaneous Q4H  . mometasone-formoterol  2 puff Inhalation BID  . [START ON 11/07/2019] pantoprazole  40 mg Intravenous Q12H  . tiotropium  18 mcg Inhalation Daily   Continuous Infusions: . sodium chloride 10 mL/hr at 11/05/19 1600  . cefTRIAXone (ROCEPHIN)  IV Stopped (11/05/19 1043)  . octreotide  (SANDOSTATIN)    IV infusion 50 mcg/hr (11/05/19 1600)  . pantoprozole (PROTONIX) infusion 8 mg/hr (11/05/19 1600)     Time spent: 28 minutes with over 50% of the time coordinating the patient's care    Jae DireJared E Kiley Torrence, DO [facility] Pager [phone]  Call night coverage person covering after 7pm

## 2019-11-05 NOTE — Progress Notes (Signed)
   11/05/19 0730  Clinical Encounter Type  Visited With Patient  Visit Type Follow-up;Spiritual support;Social support  Referral From Chaplain  Consult/Referral To Chaplain  Ch visited with Pt per request from [facility]. Pt expressed to me the same things that she told Salley Scarlet. I prayed for the PT and will follow-up with Pt.

## 2019-11-06 DIAGNOSIS — G8929 Other chronic pain: Secondary | ICD-10-CM

## 2019-11-06 LAB — HEMOGLOBIN: Hemoglobin: 11 g/dL — ABNORMAL LOW (ref 12.0–15.0)

## 2019-11-06 LAB — GLUCOSE, CAPILLARY
Glucose-Capillary: 118 mg/dL — ABNORMAL HIGH (ref 70–99)
Glucose-Capillary: 124 mg/dL — ABNORMAL HIGH (ref 70–99)
Glucose-Capillary: 142 mg/dL — ABNORMAL HIGH (ref 70–99)
Glucose-Capillary: 144 mg/dL — ABNORMAL HIGH (ref 70–99)
Glucose-Capillary: 151 mg/dL — ABNORMAL HIGH (ref 70–99)
Glucose-Capillary: 170 mg/dL — ABNORMAL HIGH (ref 70–99)

## 2019-11-06 LAB — RENAL FUNCTION PANEL
Albumin: 2.7 g/dL — ABNORMAL LOW (ref 3.5–5.0)
Anion gap: 5 (ref 5–15)
BUN: 19 mg/dL (ref 6–20)
CO2: 27 mmol/L (ref 22–32)
Calcium: 7.5 mg/dL — ABNORMAL LOW (ref 8.9–10.3)
Chloride: 111 mmol/L (ref 98–111)
Creatinine, Ser: 0.73 mg/dL (ref 0.44–1.00)
GFR calc Af Amer: >60 mL/min (ref >60)
GFR calc non Af Amer: >60 mL/min (ref >60)
Glucose, Bld: 138 mg/dL — ABNORMAL HIGH (ref 70–99)
Phosphorus: 2.5 mg/dL (ref 2.5–4.6)
Potassium: 4.1 mmol/L (ref 3.5–5.1)
Sodium: 143 mmol/L (ref 135–145)

## 2019-11-06 LAB — PROCALCITONIN: Procalcitonin: <0.1 ng/mL

## 2019-11-06 LAB — CBC
HCT: 35.3 % — ABNORMAL LOW (ref 36.0–46.0)
Hemoglobin: 11.1 g/dL — ABNORMAL LOW (ref 12.0–15.0)
MCH: 32.1 pg (ref 26.0–34.0)
MCHC: 31.4 g/dL (ref 30.0–36.0)
MCV: 102 fL — ABNORMAL HIGH (ref 80.0–100.0)
Platelets: 113 10*3/uL — ABNORMAL LOW (ref 150–400)
RBC: 3.46 MIL/uL — ABNORMAL LOW (ref 3.87–5.11)
RDW: 14.3 % (ref 11.5–15.5)
WBC: 10.1 10*3/uL (ref 4.0–10.5)
nRBC: 0 % (ref 0.0–0.2)

## 2019-11-06 LAB — MAGNESIUM: Magnesium: 1.8 mg/dL (ref 1.7–2.4)

## 2019-11-06 MED ORDER — OXYCODONE HCL 5 MG PO TABS
5.0000 mg | ORAL_TABLET | Freq: Four times a day (QID) | ORAL | Status: DC | PRN
  Administered 2019-11-06 – 2019-11-08 (×5): 5 mg via ORAL
  Filled 2019-11-06 (×6): qty 1

## 2019-11-06 NOTE — Plan of Care (Signed)
Pt admitted this shift. Oriented to room, bed low and locked, call light within reach. Pt ambulates to restroom with standby assist. No blood noted in urine. Problem: Education: Goal: Knowledge of General Education information will improve Description: Including pain rating scale, medication(s)/side effects and non-pharmacologic comfort measures Outcome: Progressing   Problem: Health Behavior/Discharge Planning: Goal: Ability to manage health-related needs will improve Outcome: Progressing   Problem: Clinical Measurements: Goal: Ability to maintain clinical measurements within normal limits will improve Outcome: Progressing Goal: Will remain free from infection Outcome: Progressing Goal: Diagnostic test results will improve Outcome: Progressing Goal: Respiratory complications will improve Outcome: Progressing Goal: Cardiovascular complication will be avoided Outcome: Progressing   Problem: Activity: Goal: Risk for activity intolerance will decrease Outcome: Progressing   Problem: Nutrition: Goal: Adequate nutrition will be maintained Outcome: Progressing   Problem: Coping: Goal: Level of anxiety will decrease Outcome: Progressing   Problem: Elimination: Goal: Will not experience complications related to bowel motility Outcome: Progressing Goal: Will not experience complications related to urinary retention Outcome: Progressing   Problem: Pain Managment: Goal: General experience of comfort will improve Outcome: Progressing   Problem: Safety: Goal: Ability to remain free from injury will improve Outcome: Progressing   Problem: Skin Integrity: Goal: Risk for impaired skin integrity will decrease Outcome: Progressing   Problem: Education: Goal: Knowledge of General Education information will improve Description: Including pain rating scale, medication(s)/side effects and non-pharmacologic comfort measures Outcome: Progressing   Problem: Health Behavior/Discharge  Planning: Goal: Ability to manage health-related needs will improve Outcome: Progressing   Problem: Clinical Measurements: Goal: Ability to maintain clinical measurements within normal limits will improve Outcome: Progressing Goal: Will remain free from infection Outcome: Progressing Goal: Diagnostic test results will improve Outcome: Progressing Goal: Respiratory complications will improve Outcome: Progressing Goal: Cardiovascular complication will be avoided Outcome: Progressing   Problem: Activity: Goal: Risk for activity intolerance will decrease Outcome: Progressing   Problem: Nutrition: Goal: Adequate nutrition will be maintained Outcome: Progressing   Problem: Coping: Goal: Level of anxiety will decrease Outcome: Progressing   Problem: Elimination: Goal: Will not experience complications related to bowel motility Outcome: Progressing Goal: Will not experience complications related to urinary retention Outcome: Progressing   Problem: Pain Managment: Goal: General experience of comfort will improve Outcome: Progressing   Problem: Safety: Goal: Ability to remain free from injury will improve Outcome: Progressing   Problem: Skin Integrity: Goal: Risk for impaired skin integrity will decrease Outcome: Progressing

## 2019-11-06 NOTE — Progress Notes (Signed)
PROGRESS NOTE    Ileen Kahre Vanzee    Code Status: Full Code  TKW:[id number] DOB: 1965-09-14 DOA: 11/04/2019 LOS: 2 days  PCP: Hyman Hopes, MD CC:  Chief Complaint  Patient presents with  . Hematemesis       Hospital Summary   This is a 54 year old female with a history of cirrhosis, diabetes, HCV treated, HTN, OSA, alcohol use, who presented to the ED for evaluation of several episodes of hematemesis. Noted to have been taking BC powders and aspirin daily x 1 week for neck pain. Also had 2 episodes of maroon colored stools prior to admission.   ED Course:  Hb 15.5, started on Rocephin, IV protonix, and octreotide. GI was consulted Hypotensive and altered, she received IVF with some improvement however required levophed for persistent hypotension and PCCM was consulted and she was admitted to the stepdown unit  7/8: EGD with Dr. Servando Snare - Grade III esophageal varices, incompletely eradicated, banded. 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   7/9: off pressors and doing well  A & P   Principal Problem:   Hematemesis Active Problems:   Diabetes mellitus without complication (HCC)   Hypertension   Hepatitis C, chronic (HCC)   Alcohol dependence (HCC)   Acute upper GI bleed   UGIB (upper gastrointestinal bleed)   Melena   Acute gastric ulcer with hemorrhage   1. UGI bleed s/p EGD 7.8 with esophageal variceal banding with Dr. Servando Snare Possibly from NSAID use and cirrhosis a. Doing well b. Advance to soft diet c. Discussed with GI: Octreotide for total 72 hours (will complete at 12 pm on 7/11) d. Continue ceftriaxone for SBP prophylaxis, can discharge on Cipro for total 7 days antibiotics when ready e. Continue protonix f. Outpatient GI follow up  2. Hypotension, likely from volume depletion post hematemesis and melena vs. Sepsis related from SBP or GI infection,  stable a. Afebrile and without leukocytosis b. Paracentesis recommended by PCCM but I dont see an order. No abdominal pain, distension or encephalopathy on exam, no indication for now c. Continue ceftriaxone for prophylaxis d. Hold antihypertensives  3. Diabetes a. Continue sliding scale  4. Transaminitis  a. Likely from alcohol, unlikely from HCV as this has been reportedly treated b. CIWA protocol  5. Generalized weakness a. PT  6. Hypertension a. Holding antihypertensives   7. Chronic LLE neuropathic pain a. Not improved with tramadol b. Reportedly has seen pain management in the past and is only managed by oxycodone. Reports gabapentin and lyrica have not helped in the past c. Will give low dose oxycodone and hold tramadol. Need to monitor mentation in setting of cirrhosis  8. Chronic hypoxic respiratory failure secondary to ILD, stable a. Follows at [facility]   DVT prophylaxis: SCDs Start: 11/04/19 1105   Family Communication: No family at bedside  Disposition Plan:  Status is: Inpatient  Remains inpatient appropriate because:IV treatments appropriate due to intensity of illness or inability to take PO and Inpatient level of care appropriate due to severity of illness   Dispo: The patient is from: Home              Anticipated d/c is to: Home               Anticipated d/c date is: 2 days              Patient currently is  not medically stable to d/c.          Pressure injury documentation    None  Consultants  GI PCCM   Procedures  EGD 7/8   Antibiotics   Anti-infectives (From admission, onward)   Start     Dose/Rate Route Frequency Ordered Stop   11/05/19 1000  cefTRIAXone (ROCEPHIN) 2 g in sodium chloride 0.9 % 100 mL IVPB     Discontinue     2 g 200 mL/hr over 30 Minutes Intravenous Every 24 hours 11/04/19 2219     11/04/19 1645  cefTRIAXone (ROCEPHIN) 1 g in sodium chloride 0.9 % 100 mL IVPB  Status:  Discontinued        1 g 200 mL/hr over 30  Minutes Intravenous  Once 11/04/19 1641 11/04/19 1824   11/04/19 1000  cefTRIAXone (ROCEPHIN) 1 g in sodium chloride 0.9 % 100 mL IVPB        1 g 200 mL/hr over 30 Minutes Intravenous  Once 11/04/19 0959 11/04/19 1513        Subjective   Feels generalized weakness and pain in her left leg that is similar to her baseline and not controlled by the tramadol. Reports that gabapentin and lyrica and tylenol have not helped in the past. Was seen by pain management and used to be on oxycodone. Tolerating CLD and no recurrence of GI bleed. Otherwise no events overnight  Objective   Vitals:   11/06/19 0000 11/06/19 0045 11/06/19 0459 11/06/19 0824  BP: (!) 104/46 (!) 114/59 (!) 100/58 115/63  Pulse: 78 72 71 65  Resp: 18 19 15 15   Temp:  98.1 F (36.7 C) 98.6 F (37 C) 97.6 F (36.4 C)  TempSrc:  Oral Oral Oral  SpO2: 100% 98% 100% 98%  Weight:      Height:        Intake/Output Summary (Last 24 hours) at 11/06/2019 1310 Last data filed at 11/06/2019 1010 Gross per 24 hour  Intake 937.98 ml  Output 1025 ml  Net -87.02 ml   Filed Weights   11/04/19 1547  Weight: 109.8 kg    Examination:  Physical Exam Vitals and nursing note reviewed.  Constitutional:      Appearance: Normal appearance.  HENT:     Head: Normocephalic and atraumatic.  Eyes:     Conjunctiva/sclera: Conjunctivae normal.  Cardiovascular:     Rate and Rhythm: Normal rate and regular rhythm.  Pulmonary:     Effort: Pulmonary effort is normal.     Breath sounds: Normal breath sounds.  Abdominal:     General: Abdomen is flat.     Palpations: Abdomen is soft.     Tenderness: There is abdominal tenderness in the epigastric area.  Musculoskeletal:        General: No swelling.     Comments: LLE tenderness to palpation  Skin:    Coloration: Skin is not jaundiced or pale.  Neurological:     Mental Status: She is alert. Mental status is at baseline.  Psychiatric:        Mood and Affect: Mood normal.         Behavior: Behavior normal.     Data Reviewed: I have personally reviewed following labs and imaging studies  CBC: Recent Labs  Lab 11/04/19 0435 11/04/19 0435 11/04/19 1340 11/04/19 1852 11/05/19 0451 11/05/19 1657 11/06/19 0508  WBC 9.7  --  12.7*  --  11.7*  --  10.1  NEUTROABS  --   --   --   --  10.2*  --   --   HGB 15.5*   < > 13.0 14.4 11.9* 11.5* 11.1*  HCT 46.7*  --  38.5  --  36.2  --  35.3*  MCV 96.7  --  95.8  --  100.0  --  102.0*  PLT 145*  --  141*  --  138*  --  113*   < > = values in this interval not displayed.   Basic Metabolic Panel: Recent Labs  Lab 11/04/19 0435 11/04/19 1340 11/05/19 0451 11/06/19 0508  NA 136  --  141 143  K 3.6  --  4.7 4.1  CL 102  --  112* 111  CO2 26  --  25 27  GLUCOSE 191*  --  221* 138*  BUN 17  --  35* 19  CREATININE 1.21*  --  1.46* 0.73  CALCIUM 8.1*  --  7.1* 7.5*  MG  --  1.7  --  1.8  PHOS  --  5.2*  --  2.5   GFR: Estimated Creatinine Clearance: 95.7 mL/min (by C-G formula based on SCr of 0.73 mg/dL). Liver Function Tests: Recent Labs  Lab 11/04/19 0435 11/05/19 0451 11/06/19 0508  AST 84* 32  --   ALT 83* 52*  --   ALKPHOS 64 40  --   BILITOT 1.2 0.6  --   PROT 6.3* 5.6*  --   ALBUMIN 2.8* 2.4* 2.7*   Recent Labs  Lab 11/04/19 0435  LIPASE 65*   No results for input(s): AMMONIA in the last 168 hours. Coagulation Profile: Recent Labs  Lab 11/04/19 1004  INR 1.2   Cardiac Enzymes: No results for input(s): CKTOTAL, CKMB, CKMBINDEX, TROPONINI in the last 168 hours. BNP (last 3 results) No results for input(s): PROBNP in the last 8760 hours. HbA1C: Recent Labs    11/04/19 1340  HGBA1C 6.0*   CBG: Recent Labs  Lab 11/05/19 1933 11/05/19 2342 11/06/19 0455 11/06/19 0826 11/06/19 1203  GLUCAP 175* 194* 124* 144* 118*   Lipid Profile: No results for input(s): CHOL, HDL, LDLCALC, TRIG, CHOLHDL, LDLDIRECT in the last 72 hours. Thyroid Function Tests: No results for input(s): TSH,  T4TOTAL, FREET4, T3FREE, THYROIDAB in the last 72 hours. Anemia Panel: No results for input(s): VITAMINB12, FOLATE, FERRITIN, TIBC, IRON, RETICCTPCT in the last 72 hours. Sepsis Labs: Recent Labs  Lab 11/04/19 1852 11/05/19 0451 11/06/19 0508  PROCALCITON 0.24 0.14 <0.10    Recent Results (from the past 240 hour(s))  SARS Coronavirus 2 by RT PCR (hospital order, performed in [facility] hospital lab) Nasopharyngeal Nasopharyngeal Swab     Status: None   Collection Time: 11/04/19 11:21 AM   Specimen: Nasopharyngeal Swab  Result Value Ref Range Status   SARS Coronavirus 2 NEGATIVE NEGATIVE Final    Comment: (NOTE) SARS-CoV-2 target nucleic acids are NOT DETECTED.  The SARS-CoV-2 RNA is generally detectable in upper and lower respiratory specimens during the acute phase of infection. The lowest concentration of SARS-CoV-2 viral copies this assay can detect is 250 copies / mL. A negative result does not preclude SARS-CoV-2 infection and should not be used as the sole basis for treatment or other patient management decisions.  A negative result may occur with improper specimen collection / handling, submission of specimen other than nasopharyngeal swab, presence of viral mutation(s) within the areas targeted by this assay, and inadequate number of viral copies (<250 copies / mL). A negative result must be combined with clinical observations, patient history, and  epidemiological information.  Fact Sheet for Patients:   [employer]com[employer]cy  Fact Sheet for Healthcare Providers: https://pope.com/  This test is not yet approved or  cleared by the [place] FDA and has been authorized for detection and/or diagnosis of SARS-CoV-2 by FDA under an Emergency Use Authorization (EUA).  This EUA will remain in effect (meaning this test can be used) for the duration of the COVID-19 declaration under Section 564(b)(1) of the Act, 21  U.S.C. section 360bbb-3(b)(1), unless the authorization is terminated or revoked sooner.  Performed at [facility], [address] [place], [place] [address]   MRSA PCR Screening     Status: None   Collection Time: 11/04/19  5:52 PM   Specimen: Nasopharyngeal  Result Value Ref Range Status   MRSA by PCR NEGATIVE NEGATIVE Final    Comment:        The GeneXpert MRSA Assay (FDA approved for NASAL specimens only), is one component of a comprehensive MRSA colonization surveillance program. It is not intended to diagnose MRSA infection nor to guide or monitor treatment for MRSA infections. Performed at [facility], [address], [place], [place] [address]          Radiology Studies: No results found.      Scheduled Meds: . Chlorhexidine Gluconate Cloth  6 each Topical Daily  . fluticasone  2 spray Each Nare Daily  . insulin aspart  0-15 Units Subcutaneous Q4H  . mometasone-formoterol  2 puff Inhalation BID  . [START ON 11/07/2019] pantoprazole  40 mg Intravenous Q12H  . tiotropium  18 mcg Inhalation Daily   Continuous Infusions: . sodium chloride 10 mL/hr at 11/05/19 1817  . cefTRIAXone (ROCEPHIN)  IV 2 g (11/06/19 1219)  . octreotide  (SANDOSTATIN)    IV infusion 50 mcg/hr (11/06/19 1139)  . pantoprozole (PROTONIX) infusion 8 mg/hr (11/06/19 0218)     Time spent: 30 minutes with over 50% of the time coordinating the patient's care    Jae Dire, DO [facility] Pager [phone]  Call night coverage person covering after 7pm

## 2019-11-07 DIAGNOSIS — K922 Gastrointestinal hemorrhage, unspecified: Secondary | ICD-10-CM

## 2019-11-07 LAB — GLUCOSE, CAPILLARY
Glucose-Capillary: 122 mg/dL — ABNORMAL HIGH (ref 70–99)
Glucose-Capillary: 137 mg/dL — ABNORMAL HIGH (ref 70–99)
Glucose-Capillary: 137 mg/dL — ABNORMAL HIGH (ref 70–99)
Glucose-Capillary: 225 mg/dL — ABNORMAL HIGH (ref 70–99)
Glucose-Capillary: 165 mg/dL — ABNORMAL HIGH (ref 70–99)

## 2019-11-07 LAB — HEMOGLOBIN
Hemoglobin: 11 g/dL — ABNORMAL LOW (ref 12.0–15.0)
Hemoglobin: 10.5 g/dL — ABNORMAL LOW (ref 12.0–15.0)

## 2019-11-07 MED ORDER — POLYETHYLENE GLYCOL 3350 17 G PO PACK
17.0000 g | PACK | Freq: Every day | ORAL | Status: DC
  Administered 2019-11-07: 17 g via ORAL
  Filled 2019-11-07 (×2): qty 1

## 2019-11-07 MED ORDER — SODIUM CHLORIDE 0.9 % IV SOLN
8.0000 mg/h | INTRAVENOUS | Status: DC
  Administered 2019-11-07: 8 mg/h via INTRAVENOUS
  Filled 2019-11-07 (×2): qty 80

## 2019-11-07 NOTE — Plan of Care (Signed)
  Problem: Education: Goal: Knowledge of General Education information will improve Description Including pain rating scale, medication(s)/side effects and non-pharmacologic comfort measures Outcome: Progressing   

## 2019-11-07 NOTE — Progress Notes (Signed)
Wyline Mood , MD [address], [address], [place], [place], [address] [address], [place], [place], [address] Phone: [phone]  Fax: [phone]   AMORY ZBIKOWSKI is being followed for GI bleed    Subjective: Some blood in stool but has been decreasing , denies any hematemesis or abdominal pains.    Objective: Vital signs in last 24 hours: Vitals:   11/06/19 1714 11/06/19 2000 11/06/19 2339 11/07/19 0852  BP: 112/71 124/78 109/67 100/61  Pulse: 62 68 74 70  Resp: 16  18 16   Temp: 97.6 F (36.4 C)  98 F (36.7 C) 98.1 F (36.7 C)  TempSrc: Oral  Oral Oral  SpO2: 100%  96% 100%  Weight:      Height:       Weight change:   Intake/Output Summary (Last 24 hours) at 11/07/2019 1241 Last data filed at 11/07/2019 0959 Gross per 24 hour  Intake 480 ml  Output --  Net 480 ml     Exam: Heart:: Regular rate and rhythm, S1S2 present or without murmur or extra heart sounds Lungs: normal, clear to auscultation and clear to auscultation and percussion Abdomen: soft, nontender, normal bowel sounds   Lab Results: @LABTEST2 @ Micro Results: Recent Results (from the past 240 hour(s))  SARS Coronavirus 2 by RT PCR (hospital order, performed in [facility] hospital lab) Nasopharyngeal Nasopharyngeal Swab     Status: None   Collection Time: 11/04/19 11:21 AM   Specimen: Nasopharyngeal Swab  Result Value Ref Range Status   SARS Coronavirus 2 NEGATIVE NEGATIVE Final    Comment: (NOTE) SARS-CoV-2 target nucleic acids are NOT DETECTED.  The SARS-CoV-2 RNA is generally detectable in upper and lower respiratory specimens during the acute phase of infection. The lowest concentration of SARS-CoV-2 viral copies this assay can detect is 250 copies / mL. A negative result does not preclude SARS-CoV-2 infection and should not be used as the sole basis for treatment or other patient management decisions.  A negative result may occur with improper specimen collection /  handling, submission of specimen other than nasopharyngeal swab, presence of viral mutation(s) within the areas targeted by this assay, and inadequate number of viral copies (<250 copies / mL). A negative result must be combined with clinical observations, patient history, and epidemiological information.  Fact Sheet for Patients:   [facility]  Fact Sheet for Healthcare Providers: 01/05/20  This test is not yet approved or  cleared by the [employer] FDA and has been authorized for detection and/or diagnosis of SARS-CoV-2 by FDA under an Emergency Use Authorization (EUA).  This EUA will remain in effect (meaning this test can be used) for the duration of the COVID-19 declaration under Section 564(b)(1) of the Act, 21 U.S.C. section 360bbb-3(b)(1), unless the authorization is terminated or revoked sooner.  Performed at [facility], [address] [place], [address] [place]   MRSA PCR Screening     Status: None   Collection Time: 11/04/19  5:52 PM   Specimen: Nasopharyngeal  Result Value Ref Range Status   MRSA by PCR NEGATIVE NEGATIVE Final    Comment:        The GeneXpert MRSA Assay (FDA approved for NASAL specimens only), is one component of a comprehensive MRSA colonization surveillance program. It is not intended to diagnose MRSA infection nor to guide or monitor treatment for MRSA infections. Performed at [facility], [address], [place], [address] [place]    Studies/Results: No results found. Medications:  I have reviewed the patient's current medications. Scheduled Meds: . Chlorhexidine Gluconate Cloth  6 each Topical Daily  . fluticasone  2 spray Each Nare Daily  . insulin aspart  0-15 Units Subcutaneous Q4H  . mometasone-formoterol  2 puff Inhalation BID  . polyethylene glycol  17 g Oral Daily  . tiotropium  18 mcg Inhalation Daily   Continuous Infusions: . sodium  chloride 10 mL/hr at 11/05/19 1817  . cefTRIAXone (ROCEPHIN)  IV 2 g (11/07/19 0939)  . octreotide  (SANDOSTATIN)    IV infusion 50 mcg/hr (11/06/19 1139)  . pantoprozole (PROTONIX) infusion     PRN Meds:.sodium chloride, lidocaine, ondansetron **OR** ondansetron (ZOFRAN) IV, oxyCODONE   Assessment: Principal Problem:   Hematemesis Active Problems:   Diabetes mellitus without complication (HCC)   Hypertension   Hepatitis C, chronic (HCC)   Alcohol dependence (HCC)   Acute upper GI bleed   UGIB (upper gastrointestinal bleed)   Melena   Acute gastric ulcer with hemorrhage   Estellar A Mcmurtry 54 y.o. female with liver cirrhosis , alcohol use, HCV treated , NSAID use admitted with BRBPR. EGD on 11/04/2019 had varices banded and gastric ulcers and duodenal AVM treated with APC. Saw her today because of some blood in her stool she noticed. She says that it has been decreasing in qty day to day and her Hb is stable.   Plan: 1. Monitor CBC , expect stool color to normalize next 1-2 days , if has further bleeding with drop in Hb may need to consider transfer to [facility] for TIPS if felt secondary to variceal re bleed vs tagged RBC scan .   2. Continue octreotide for 72 hours, antibiotics for prophylaxsis for a total of 5 days    LOS: 3 days   Wyline Mood, MD 11/07/2019, 12:41 PM

## 2019-11-07 NOTE — Progress Notes (Addendum)
PROGRESS NOTE    [name]    Code Status: Full Code  BOF:[id number] DOB: Jan 02, 1966 DOA: 11/04/2019 LOS: 3 days  PCP: Hyman Hopes, MD CC:  Chief Complaint  Patient presents with  . Hematemesis       Hospital Summary   This is a 54 year old female with a history of cirrhosis, diabetes, HCV treated, HTN, OSA, alcohol use, who presented to the ED for evaluation of several episodes of hematemesis. Noted to have been taking BC powders and aspirin daily x 1 week for neck pain. Also had 2 episodes of maroon colored stools prior to admission.   ED Course:  Hb 15.5, started on Rocephin, IV protonix, and octreotide. GI was consulted Hypotensive and altered, she received IVF with some improvement however required levophed for persistent hypotension and PCCM was consulted and she was admitted to the stepdown unit  7/8: EGD with Dr. Servando Snare - Grade III esophageal varices, incompletely eradicated, banded. 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   7/9: off pressors and doing well  A & P   Principal Problem:   Hematemesis Active Problems:   Diabetes mellitus without complication (HCC)   Hypertension   Hepatitis C, chronic (HCC)   Alcohol dependence (HCC)   Acute upper GI bleed   UGIB (upper gastrointestinal bleed)   Melena   Acute gastric ulcer with hemorrhage   1. UGI bleed s/p EGD 7.8 with esophageal variceal banding with Dr. Servando Snare Possibly from NSAID use and cirrhosis a. Tolerating soft diet b. Recurrent small volume melanotic stool x 2 overnight. GI notified - continue observation for now but change to clear liquid diet and renew PPI drip. Can advance to soft diet later today or tomorrow if no rebleed. If significant bleed then she will likely need TIPS at [facility]. Discussed with GI: Octreotide for total 72 hours (will complete at 12 pm on 7/11) d. Continue ceftriaxone  for SBP prophylaxis, can discharge on Cipro for total 7 days antibiotics when ready e. Continue protonix f. Outpatient GI follow up  2. Hypotension, likely from volume depletion post hematemesis and melena vs. Sepsis related from SBP or GI infection, stable a. Afebrile and without leukocytosis b. Paracentesis recommended by PCCM but I dont see an order. No abdominal pain, distension or encephalopathy on exam, no indication for now c. Continue ceftriaxone for prophylaxis d. Hold antihypertensives  3. Diabetes a. Continue sliding scale  4. Transaminitis  a. Likely from alcohol, unlikely from HCV as this has been reportedly treated b. CIWA protocol  5. Generalized weakness a. PT  6. Constipation a. miralax  7. Hypertension a. Holding antihypertensives   8. Chronic LLE neuropathic pain a. Continue low dose oxycodone and hold tramadol. Need to monitor mentation in setting of cirrhosis  9. Chronic hypoxic respiratory failure secondary to ILD, stable a. Follows at [facility]   DVT prophylaxis: SCDs Start: 11/04/19 1105   Family Communication: No family at bedside  Disposition Plan: observing for recurrence of GI bleed and needs to complete 72 hours of octreotide. If no recurrence, then possible discharge tomorrow Status is: Inpatient  Remains inpatient appropriate because:IV treatments appropriate due to intensity of illness or inability to take PO and Inpatient level of care appropriate due to severity of illness   Dispo: The patient is from: Home              Anticipated d/c  is to: Home               Anticipated d/c date is: 1 day              Patient currently is not medically stable to d/c.          Pressure injury documentation    None  Consultants  GI PCCM   Procedures  EGD 7/8   Antibiotics   Anti-infectives (From admission, onward)   Start     Dose/Rate Route Frequency Ordered Stop   11/05/19 1000  cefTRIAXone (ROCEPHIN) 2 g in sodium chloride 0.9 %  100 mL IVPB     Discontinue     2 g 200 mL/hr over 30 Minutes Intravenous Every 24 hours 11/04/19 2219     11/04/19 1645  cefTRIAXone (ROCEPHIN) 1 g in sodium chloride 0.9 % 100 mL IVPB  Status:  Discontinued        1 g 200 mL/hr over 30 Minutes Intravenous  Once 11/04/19 1641 11/04/19 1824   11/04/19 1000  cefTRIAXone (ROCEPHIN) 1 g in sodium chloride 0.9 % 100 mL IVPB        1 g 200 mL/hr over 30 Minutes Intravenous  Once 11/04/19 0959 11/04/19 1513        Subjective   Reports small volume melanotic stool x 2 overnight. No nausea/ vomiting. Admits to constipation. No other issues or overnight events  Objective   Vitals:   11/06/19 1714 11/06/19 2000 11/06/19 2339 11/07/19 0852  BP: 112/71 124/78 109/67 100/61  Pulse: 62 68 74 70  Resp: 16  18 16   Temp: 97.6 F (36.4 C)  98 F (36.7 C) 98.1 F (36.7 C)  TempSrc: Oral  Oral Oral  SpO2: 100%  96% 100%  Weight:      Height:        Intake/Output Summary (Last 24 hours) at 11/07/2019 1201 Last data filed at 11/07/2019 0959 Gross per 24 hour  Intake 480 ml  Output --  Net 480 ml   Filed Weights   11/04/19 1547  Weight: 109.8 kg    Examination:  Physical Exam Vitals and nursing note reviewed.  Constitutional:      Appearance: Normal appearance.  HENT:     Head: Normocephalic and atraumatic.  Cardiovascular:     Rate and Rhythm: Normal rate and regular rhythm.  Pulmonary:     Effort: Pulmonary effort is normal.     Breath sounds: Normal breath sounds.  Abdominal:     General: Abdomen is flat. There is no distension.     Palpations: Abdomen is soft.     Tenderness: There is no abdominal tenderness.  Musculoskeletal:        General: No swelling or tenderness.  Skin:    Coloration: Skin is not jaundiced or pale.  Neurological:     Mental Status: She is alert. Mental status is at baseline.  Psychiatric:        Mood and Affect: Mood normal.        Behavior: Behavior normal.     Data Reviewed: I have  personally reviewed following labs and imaging studies  CBC: Recent Labs  Lab 11/04/19 0435 11/04/19 0435 11/04/19 1340 11/04/19 1852 11/05/19 0451 11/05/19 1657 11/06/19 0508 11/06/19 1654 11/07/19 0449  WBC 9.7  --  12.7*  --  11.7*  --  10.1  --   --   NEUTROABS  --   --   --   --  10.2*  --   --   --   --  HGB 15.5*   < > 13.0   < > 11.9* 11.5* 11.1* 11.0* 11.0*  HCT 46.7*  --  38.5  --  36.2  --  35.3*  --   --   MCV 96.7  --  95.8  --  100.0  --  102.0*  --   --   PLT 145*  --  141*  --  138*  --  113*  --   --    < > = values in this interval not displayed.   Basic Metabolic Panel: Recent Labs  Lab 11/04/19 0435 11/04/19 1340 11/05/19 0451 11/06/19 0508  NA 136  --  141 143  K 3.6  --  4.7 4.1  CL 102  --  112* 111  CO2 26  --  25 27  GLUCOSE 191*  --  221* 138*  BUN 17  --  35* 19  CREATININE 1.21*  --  1.46* 0.73  CALCIUM 8.1*  --  7.1* 7.5*  MG  --  1.7  --  1.8  PHOS  --  5.2*  --  2.5   GFR: Estimated Creatinine Clearance: 95.7 mL/min (by C-G formula based on SCr of 0.73 mg/dL). Liver Function Tests: Recent Labs  Lab 11/04/19 0435 11/05/19 0451 11/06/19 0508  AST 84* 32  --   ALT 83* 52*  --   ALKPHOS 64 40  --   BILITOT 1.2 0.6  --   PROT 6.3* 5.6*  --   ALBUMIN 2.8* 2.4* 2.7*   Recent Labs  Lab 11/04/19 0435  LIPASE 65*   No results for input(s): AMMONIA in the last 168 hours. Coagulation Profile: Recent Labs  Lab 11/04/19 1004  INR 1.2   Cardiac Enzymes: No results for input(s): CKTOTAL, CKMB, CKMBINDEX, TROPONINI in the last 168 hours. BNP (last 3 results) No results for input(s): PROBNP in the last 8760 hours. HbA1C: Recent Labs    11/04/19 1340  HGBA1C 6.0*   CBG: Recent Labs  Lab 11/06/19 1941 11/06/19 2348 11/07/19 0430 11/07/19 0851 11/07/19 1141  GLUCAP 170* 142* 122* 137* 137*   Lipid Profile: No results for input(s): CHOL, HDL, LDLCALC, TRIG, CHOLHDL, LDLDIRECT in the last 72 hours. Thyroid Function  Tests: No results for input(s): TSH, T4TOTAL, FREET4, T3FREE, THYROIDAB in the last 72 hours. Anemia Panel: No results for input(s): VITAMINB12, FOLATE, FERRITIN, TIBC, IRON, RETICCTPCT in the last 72 hours. Sepsis Labs: Recent Labs  Lab 11/04/19 1852 11/05/19 0451 11/06/19 0508  PROCALCITON 0.24 0.14 <0.10    Recent Results (from the past 240 hour(s))  SARS Coronavirus 2 by RT PCR (hospital order, performed in [facility] hospital lab) Nasopharyngeal Nasopharyngeal Swab     Status: None   Collection Time: 11/04/19 11:21 AM   Specimen: Nasopharyngeal Swab  Result Value Ref Range Status   SARS Coronavirus 2 NEGATIVE NEGATIVE Final    Comment: (NOTE) SARS-CoV-2 target nucleic acids are NOT DETECTED.  The SARS-CoV-2 RNA is generally detectable in upper and lower respiratory specimens during the acute phase of infection. The lowest concentration of SARS-CoV-2 viral copies this assay can detect is 250 copies / mL. A negative result does not preclude SARS-CoV-2 infection and should not be used as the sole basis for treatment or other patient management decisions.  A negative result may occur with improper specimen collection / handling, submission of specimen other than nasopharyngeal swab, presence of viral mutation(s) within the areas targeted by this assay, and inadequate number of viral copies (<250  copies / mL). A negative result must be combined with clinical observations, patient history, and epidemiological information.  Fact Sheet for Patients:   BoilerBrush.com.cy  Fact Sheet for Healthcare Providers: https://pope.com/  This test is not yet approved or  cleared by the [place] FDA and has been authorized for detection and/or diagnosis of SARS-CoV-2 by FDA under an Emergency Use Authorization (EUA).  This EUA will remain in effect (meaning this test can be used) for the duration of the COVID-19 declaration under  Section 564(b)(1) of the Act, 21 U.S.C. section 360bbb-3(b)(1), unless the authorization is terminated or revoked sooner.  Performed at [facility], [address] [place], [place] [address]   MRSA PCR Screening     Status: None   Collection Time: 11/04/19  5:52 PM   Specimen: Nasopharyngeal  Result Value Ref Range Status   MRSA by PCR NEGATIVE NEGATIVE Final    Comment:        The GeneXpert MRSA Assay (FDA approved for NASAL specimens only), is one component of a comprehensive MRSA colonization surveillance program. It is not intended to diagnose MRSA infection nor to guide or monitor treatment for MRSA infections. Performed at [facility], [address] [place], [place] [address]          Radiology Studies: No results found.      Scheduled Meds: . Chlorhexidine Gluconate Cloth  6 each Topical Daily  . fluticasone  2 spray Each Nare Daily  . insulin aspart  0-15 Units Subcutaneous Q4H  . mometasone-formoterol  2 puff Inhalation BID  . pantoprazole  40 mg Intravenous Q12H  . polyethylene glycol  17 g Oral Daily  . tiotropium  18 mcg Inhalation Daily   Continuous Infusions: . sodium chloride 10 mL/hr at 11/05/19 1817  . cefTRIAXone (ROCEPHIN)  IV 2 g (11/07/19 0939)  . octreotide  (SANDOSTATIN)    IV infusion 50 mcg/hr (11/06/19 1139)     Time spent: 29 minutes with over 50% of the time coordinating the patient's care    Jae Dire, DO [facility] Pager [phone]  Call night coverage person covering after 7pm

## 2019-11-08 LAB — COMPREHENSIVE METABOLIC PANEL
ALT: 38 U/L (ref 0–44)
AST: 29 U/L (ref 15–41)
Albumin: 2.6 g/dL — ABNORMAL LOW (ref 3.5–5.0)
Alkaline Phosphatase: 47 U/L (ref 38–126)
Anion gap: 4 — ABNORMAL LOW (ref 5–15)
BUN: 5 mg/dL — ABNORMAL LOW (ref 6–20)
CO2: 29 mmol/L (ref 22–32)
Calcium: 7.8 mg/dL — ABNORMAL LOW (ref 8.9–10.3)
Chloride: 105 mmol/L (ref 98–111)
Creatinine, Ser: 0.55 mg/dL (ref 0.44–1.00)
GFR calc Af Amer: >60 mL/min (ref >60)
GFR calc non Af Amer: >60 mL/min (ref >60)
Glucose, Bld: 167 mg/dL — ABNORMAL HIGH (ref 70–99)
Potassium: 4.2 mmol/L (ref 3.5–5.1)
Sodium: 138 mmol/L (ref 135–145)
Total Bilirubin: 0.7 mg/dL (ref 0.3–1.2)
Total Protein: 5.9 g/dL — ABNORMAL LOW (ref 6.5–8.1)

## 2019-11-08 LAB — GLUCOSE, CAPILLARY
Glucose-Capillary: 112 mg/dL — ABNORMAL HIGH (ref 70–99)
Glucose-Capillary: 115 mg/dL — ABNORMAL HIGH (ref 70–99)
Glucose-Capillary: 129 mg/dL — ABNORMAL HIGH (ref 70–99)
Glucose-Capillary: 180 mg/dL — ABNORMAL HIGH (ref 70–99)
Glucose-Capillary: 97 mg/dL (ref 70–99)

## 2019-11-08 LAB — CBC
HCT: 33.9 % — ABNORMAL LOW (ref 36.0–46.0)
Hemoglobin: 10.6 g/dL — ABNORMAL LOW (ref 12.0–15.0)
MCH: 32.2 pg (ref 26.0–34.0)
MCHC: 31.3 g/dL (ref 30.0–36.0)
MCV: 103 fL — ABNORMAL HIGH (ref 80.0–100.0)
Platelets: 130 10*3/uL — ABNORMAL LOW (ref 150–400)
RBC: 3.29 MIL/uL — ABNORMAL LOW (ref 3.87–5.11)
RDW: 14.2 % (ref 11.5–15.5)
WBC: 9.6 10*3/uL (ref 4.0–10.5)
nRBC: 0 % (ref 0.0–0.2)

## 2019-11-08 LAB — HEMOGLOBIN: Hemoglobin: 11.2 g/dL — ABNORMAL LOW (ref 12.0–15.0)

## 2019-11-08 MED ORDER — OXYCODONE HCL 5 MG PO TABS: 5.0000 mg | ORAL_TABLET | ORAL | 0 refills | Status: AC | PRN

## 2019-11-08 MED ORDER — PANTOPRAZOLE SODIUM 40 MG PO TBEC
40.0000 mg | DELAYED_RELEASE_TABLET | Freq: Two times a day (BID) | ORAL | 1 refills | Status: DC
Start: 2019-11-08 — End: 2019-11-08

## 2019-11-08 MED ORDER — OXYCODONE HCL 5 MG PO TABS: 5.0000 mg | ORAL_TABLET | ORAL | 0 refills | Status: DC | PRN

## 2019-11-08 MED ORDER — PANTOPRAZOLE SODIUM 40 MG PO TBEC
40.0000 mg | DELAYED_RELEASE_TABLET | Freq: Two times a day (BID) | ORAL | 1 refills | Status: AC
Start: 2019-11-08 — End: ?

## 2019-11-08 NOTE — Progress Notes (Signed)
Pt discharged via private vehicle. Discharge instructions and prescriptions  explained and given to pt. No further questions at this time. No s/s of distress noted. IV removed.

## 2019-11-08 NOTE — Discharge Summary (Addendum)
Physician Discharge Summary  [name] WUJ:[id number] DOB: November 25, 1965   PCP: Hyman Hopes, MD  Admit date: 11/04/2019 Discharge date: 11/08/2019 Length of Stay: 4 days   Code Status: Full Code  Admitted From:  Home Discharged to:   Home Home Health:  None  Equipment/Devices:  None Discharge Condition:  Stable  Recommendations for Outpatient Follow-up   1. Follow up with PCP in 1 week 2. Follow up CBC  3. Follow up with GI   Hospital Summary   This is a 54 year old female with a history of cirrhosis, diabetes, HCV treated, HTN, OSA, alcohol use, who presented to the ED for evaluation of several episodes of hematemesis. Noted to have been taking BC powders and aspirin daily x 1 week for neck pain. Also had 2 episodes of maroon colored stools prior to admission.   ED Course:  Hb 15.5, started on Rocephin, IV protonix, and octreotide. GI was consulted Hypotensive and altered, she received IVF with some improvement however required levophed for persistent hypotension and PCCM was consulted and she was admitted to the stepdown unit  Admitted for upper GI bleed  7/8: EGD with Dr. Servando Snare - Grade III esophageal varices, incompletely eradicated, banded. 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   7/9: off pressors and doing well  7/11: Recurrent small volume melena. Diet back to clear liquid diet. Completed 2 hours of octreotide. Observed overnight 7/12: No recurrence of melena, tolerated advancement of diet to soft diet. Discharged in stable condition and recommended outpatient follow-up with CBC this week  A & P   Principal Problem:   Hematemesis Active Problems:   Diabetes mellitus without complication (HCC)   Hypertension   Hepatitis C, chronic (HCC)   Alcohol dependence (HCC)   Acute upper GI bleed   UGIB (upper gastrointestinal bleed)   Melena   Acute  gastric ulcer with hemorrhage     1. UGI bleed s/p EGD 7.8 with esophageal variceal banding with Dr. Servando Snare Possibly from NSAID use and cirrhosis a. Tolerating soft diet b. Completed octreotide 72 hours in total of 5 days ceftriaxone for SBP prophylaxis c. Discharged with tonics 40 mg p.o. twice daily d. Advised to abstain from NSAIDs at bedside e. Outpatient GI follow up  2. Hypotension, likely from volume depletion post hematemesis and melena, resolved a. Not septic and no sign of infection b. Restart home meds at discharge  3. Diabetes a. Continue home regimen  4. Transaminitis  a. Likely from alcohol, unlikely from HCV as this has been reportedly treated  5. Generalized weakness a. PT cleared patient  6. Constipation a. miralax OTC  7. Hypertension a. Continue home regimen  8. Chronic LLE neuropathic pain a. Not relieved with any medications other than oxycodone b. Short course of oxycodone at discharge  9. Chronic hypoxic respiratory failure secondary to ILD, At baseline a. Follows at [facility]  . GI  Procedures  . EGD  Antibiotics   Anti-infectives (From admission, onward)   Start     Dose/Rate Route Frequency Ordered Stop   11/05/19 1000  cefTRIAXone (ROCEPHIN) 2 g in sodium chloride 0.9 % 100 mL IVPB     Discontinue     2 g 200 mL/hr over 30 Minutes Intravenous Every 24 hours 11/04/19 2219     11/04/19 1645  cefTRIAXone (ROCEPHIN) 1 g in sodium chloride 0.9 % 100 mL  IVPB  Status:  Discontinued        1 g 200 mL/hr over 30 Minutes Intravenous  Once 11/04/19 1641 11/04/19 1824   11/04/19 1000  cefTRIAXone (ROCEPHIN) 1 g in sodium chloride 0.9 % 100 mL IVPB        1 g 200 mL/hr over 30 Minutes Intravenous  Once 11/04/19 0959 11/04/19 1513       Subjective  Patient seen and examined at bedside no acute distress and resting comfortably.  No events overnight.  Tolerating diet. No recurrent bloody bowel movements or vomiting. In good  spirits and anticipating discharge.   Denies any chest pain, shortness of breath, fever, nausea, vomiting. Otherwise ROS negative    Objective   Discharge Exam: Vitals:   11/08/19 0004 11/08/19 0756  BP: 98/61 115/62  Pulse: 75 63  Resp: 16 17  Temp: 98.2 F (36.8 C) 98.5 F (36.9 C)  SpO2: 98% 94%   Vitals:   11/07/19 0852 11/07/19 1540 11/08/19 0004 11/08/19 0756  BP: 100/61 95/63 98/61  115/62  Pulse: 70 72 75 63  Resp: 16 16 16 17   Temp: 98.1 F (36.7 C) 97.7 F (36.5 C) 98.2 F (36.8 C) 98.5 F (36.9 C)  TempSrc: Oral Oral Oral Oral  SpO2: 100% 97% 98% 94%  Weight:      Height:        Physical Exam Vitals and nursing note reviewed.  Constitutional:      Appearance: Normal appearance.  HENT:     Head: Normocephalic and atraumatic.  Eyes:     Conjunctiva/sclera: Conjunctivae normal.  Cardiovascular:     Rate and Rhythm: Normal rate and regular rhythm.  Pulmonary:     Effort: Pulmonary effort is normal.     Breath sounds: Normal breath sounds.  Abdominal:     General: Abdomen is flat.     Palpations: Abdomen is soft.  Musculoskeletal:        General: No swelling or tenderness.  Skin:    Coloration: Skin is not jaundiced or pale.  Neurological:     Mental Status: She is alert. Mental status is at baseline.  Psychiatric:        Mood and Affect: Mood normal.        Behavior: Behavior normal.       The results of significant diagnostics from this hospitalization (including imaging, microbiology, ancillary and laboratory) are listed below for reference.     Microbiology: Recent Results (from the past 240 hour(s))  SARS Coronavirus 2 by RT PCR (hospital order, performed in [facility] hospital lab) Nasopharyngeal Nasopharyngeal Swab     Status: None   Collection Time: 11/04/19 11:21 AM   Specimen: Nasopharyngeal Swab  Result Value Ref Range Status   SARS Coronavirus 2 NEGATIVE NEGATIVE Final    Comment: (NOTE) SARS-CoV-2 target nucleic acids are  NOT DETECTED.  The SARS-CoV-2 RNA is generally detectable in upper and lower respiratory specimens during the acute phase of infection. The lowest concentration of SARS-CoV-2 viral copies this assay can detect is 250 copies / mL. A negative result does not preclude SARS-CoV-2 infection and should not be used as the sole basis for treatment or other patient management decisions.  A negative result may occur with improper specimen collection / handling, submission of specimen other than nasopharyngeal swab, presence of viral mutation(s) within the areas targeted by this assay, and inadequate number of viral copies (<250 copies / mL). A negative result must be combined with clinical observations, patient  history, and epidemiological information.  Fact Sheet for Patients:   BoilerBrush.com.cy  Fact Sheet for Healthcare Providers: https://pope.com/  This test is not yet approved or  cleared by the [place] FDA and has been authorized for detection and/or diagnosis of SARS-CoV-2 by FDA under an Emergency Use Authorization (EUA).  This EUA will remain in effect (meaning this test can be used) for the duration of the COVID-19 declaration under Section 564(b)(1) of the Act, 21 U.S.C. section 360bbb-3(b)(1), unless the authorization is terminated or revoked sooner.  Performed at [facility], [address] [place], [place] [address]   MRSA PCR Screening     Status: None   Collection Time: 11/04/19  5:52 PM   Specimen: Nasopharyngeal  Result Value Ref Range Status   MRSA by PCR NEGATIVE NEGATIVE Final    Comment:        The GeneXpert MRSA Assay (FDA approved for NASAL specimens only), is one component of a comprehensive MRSA colonization surveillance program. It is not intended to diagnose MRSA infection nor to guide or monitor treatment for MRSA infections. Performed at [facility], [address]  [place], [place] [address]      Labs: BNP (last 3 results) No results for input(s): BNP in the last 8760 hours. Basic Metabolic Panel: Recent Labs  Lab 11/04/19 0435 11/04/19 1340 11/05/19 0451 11/06/19 0508 11/08/19 0509  NA 136  --  141 143 138  K 3.6  --  4.7 4.1 4.2  CL 102  --  112* 111 105  CO2 26  --  GLUCOSE 191*  --  221* 138* 167*  BUN 17  --  35* 19 5*  CREATININE 1.21*  --  1.46* 0.73 0.55  CALCIUM 8.1*  --  7.1* 7.5* 7.8*  MG  --  1.7  --  1.8  --   PHOS  --  5.2*  --  2.5  --    Liver Function Tests: Recent Labs  Lab 11/04/19 0435 11/05/19 0451 11/06/19 0508 11/08/19 0509  AST 84* 32  --  29  ALT 83* 52*  --  38  ALKPHOS 64 40  --  47  BILITOT 1.2 0.6  --  0.7  PROT 6.3* 5.6*  --  5.9*  ALBUMIN 2.8* 2.4* 2.7* 2.6*   Recent Labs  Lab 11/04/19 0435  LIPASE 65*   No results for input(s): AMMONIA in the last 168 hours. CBC: Recent Labs  Lab 11/04/19 0435 11/04/19 0435 11/04/19 1340 11/04/19 1852 11/05/19 0451 11/05/19 1657 11/06/19 0508 11/06/19 1654 11/07/19 0449 11/07/19 1653 11/08/19 0509  WBC 9.7  --  12.7*  --  11.7*  --  10.1  --   --   --  9.6  NEUTROABS  --   --   --   --  10.2*  --   --   --   --   --   --   HGB 15.5*   < > 13.0   < > 11.9*   < > 11.1* 11.0* 11.0* 10.5* 10.6*  HCT 46.7*  --  38.5  --  36.2  --  35.3*  --   --   --  33.9*  MCV 96.7  --  95.8  --  100.0  --  102.0*  --   --   --  103.0*  PLT 145*  --  141*  --  138*  --  113*  --   --   --  130*   < > = values in this interval not displayed.   Cardiac Enzymes: No results for input(s): CKTOTAL, CKMB, CKMBINDEX, TROPONINI in the last 168 hours. BNP: Invalid input(s): POCBNP CBG: Recent Labs  Lab 11/07/19 2118 11/08/19 0005 11/08/19 0425 11/08/19 0758 11/08/19 1151  GLUCAP 165* 97 180* 115* 129*   D-Dimer No results for input(s): DDIMER in the last 72 hours. Hgb A1c No results for input(s): HGBA1C in the last 72 hours. Lipid Profile No results  for input(s): CHOL, HDL, LDLCALC, TRIG, CHOLHDL, LDLDIRECT in the last 72 hours. Thyroid function studies No results for input(s): TSH, T4TOTAL, T3FREE, THYROIDAB in the last 72 hours.  Invalid input(s): FREET3 Anemia work up No results for input(s): VITAMINB12, FOLATE, FERRITIN, TIBC, IRON, RETICCTPCT in the last 72 hours. Urinalysis    Component Value Date/Time   COLORURINE Amber 01/31/2013 2039   APPEARANCEUR Hazy 01/31/2013 2039   LABSPEC 1.017 01/31/2013 2039   PHURINE 5.0 01/31/2013 2039   GLUCOSEU Negative 01/31/2013 2039   HGBUR 2+ 01/31/2013 2039   BILIRUBINUR Negative 01/31/2013 2039   KETONESUR Negative 01/31/2013 2039   PROTEINUR Negative 01/31/2013 2039   NITRITE Positive 01/31/2013 2039   LEUKOCYTESUR Trace 01/31/2013 2039   Sepsis Labs Invalid input(s): PROCALCITONIN,  WBC,  LACTICIDVEN Microbiology Recent Results (from the past 240 hour(s))  SARS Coronavirus 2 by RT PCR (hospital order, performed in [facility] hospital lab) Nasopharyngeal Nasopharyngeal Swab     Status: None   Collection Time: 11/04/19 11:21 AM   Specimen: Nasopharyngeal Swab  Result Value Ref Range Status   SARS Coronavirus 2 NEGATIVE NEGATIVE Final    Comment: (NOTE) SARS-CoV-2 target nucleic acids are NOT DETECTED.  The SARS-CoV-2 RNA is generally detectable in upper and lower respiratory specimens during the acute phase of infection. The lowest concentration of SARS-CoV-2 viral copies this assay can detect is 250 copies / mL. A negative result does not preclude SARS-CoV-2 infection and should not be used as the sole basis for treatment or other patient management decisions.  A negative result may occur with improper specimen collection / handling, submission of specimen other than nasopharyngeal swab, presence of viral mutation(s) within the areas targeted by this assay, and inadequate number of viral copies (<250 copies / mL). A negative result must be combined with  clinical observations, patient history, and epidemiological information.  Fact Sheet for Patients:   BoilerBrush.com.cy  Fact Sheet for Healthcare Providers: https://pope.com/  This test is not yet approved or  cleared by the [place] FDA and has been authorized for detection and/or diagnosis of SARS-CoV-2 by FDA under an Emergency Use Authorization (EUA).  This EUA will remain in effect (meaning this test can be used) for the duration of the COVID-19 declaration under Section 564(b)(1) of the Act, 21 U.S.C. section 360bbb-3(b)(1), unless the authorization is terminated or revoked sooner.  Performed at [facility], [address] [place], [place] [address]   MRSA PCR Screening     Status: None   Collection Time: 11/04/19  5:52 PM   Specimen: Nasopharyngeal  Result Value Ref Range Status   MRSA by PCR NEGATIVE NEGATIVE Final    Comment:        The GeneXpert MRSA Assay (FDA approved for NASAL specimens only), is one component of a comprehensive MRSA colonization surveillance program. It is not intended to diagnose MRSA infection nor to guide or monitor treatment for MRSA infections. Performed at [facility], [address], [place], [place] [address]  Discharge Instructions     Discharge Instructions    Diet - low sodium heart healthy   Complete by: As directed    Discharge instructions   Complete by: As directed    - Stop drinking alcohol - Advance your diet slowly over the next several days - Take protonix 40 mg twice daily - Follow up with your gastroenterologist - Get lab work in 2-3 days to follow up on your blood levels If you have any significant change or worsening of your symptoms, do not hesitate to contact your primary care physician or return to the ED.   Increase activity slowly   Complete by: As directed      Allergies as of 11/08/2019   No Known Allergies      Medication List    STOP taking these medications   enoxaparin 40 MG/0.4ML injection Commonly known as: LOVENOX   Suboxone 2-0.5 MG Film Generic drug: Buprenorphine HCl-Naloxone HCl     TAKE these medications   albuterol 108 (90 Base) MCG/ACT inhaler Commonly known as: VENTOLIN HFA Inhale into the lungs.   cyclobenzaprine 10 MG tablet Commonly known as: FLEXERIL Take 1 tablet by mouth daily in the afternoon.   diclofenac Sodium 1 % Gel Commonly known as: VOLTAREN Apply 1 application topically every 6 (six) hours as needed.   escitalopram 5 MG tablet Commonly known as: LEXAPRO Take 5 mg by mouth at bedtime.   fluticasone 50 MCG/ACT nasal spray Commonly known as: FLONASE Place 2 sprays into both nostrils daily.   furosemide 20 MG tablet Commonly known as: LASIX TAKE 1 TABLET BY MOUTH EVERY MORNING FOR FLUID RETENTION X 3 DAYS   insulin detemir 100 UNIT/ML injection Commonly known as: LEVEMIR Inject 10-20 Units into the skin 2 (two) times daily. 20 units in morning and 10 units in evening (may add additional 20 units in evening depending on blood sugar)   lidocaine 5 % ointment Commonly known as: XYLOCAINE APPLY 1 APPLICATION TOPICALLY TWO (2) TIMES A DAY. APPLY 5 GRAMS TO AREA UP TO TWICE DAILY   loratadine 10 MG tablet Commonly known as: CLARITIN Take 10 mg by mouth daily.   losartan 100 MG tablet Commonly known as: COZAAR Take 100 mg by mouth daily.   oxyCODONE 5 MG immediate release tablet Commonly known as: Oxy IR/ROXICODONE Take 1 tablet (5 mg total) by mouth every 3 (three) hours as needed for up to 3 days for breakthrough pain. What changed: how much to take   pantoprazole 40 MG tablet Commonly known as: Protonix Take 1 tablet (40 mg total) by mouth 2 (two) times daily.   Spiriva Respimat 2.5 MCG/ACT Aers Generic drug: Tiotropium Bromide Monohydrate Inhale 2 puffs into the lungs daily in the afternoon.   triamcinolone ointment 0.5 % Commonly  known as: KENALOG Apply 1 application topically 2 (two) times daily.       No Known Allergies   Dispo: The patient is from: Home              Anticipated d/c is to: Home              Anticipated d/c date is: today              Patient currently is medically stable to d/c.       Time coordinating discharge: Over 30 minutes   SIGNED:   Jae DireJared E Ammara Raj, D.O. [facility] Pager: [phone]  11/08/2019, 4:07 PM

## 2019-11-08 NOTE — Evaluation (Signed)
Physical Therapy Evaluation Patient Details Name: NETTIE WYFFELS MRN: [id number] DOB: [birth year] Today's Date: 11/08/2019   History of Present Illness  Pt is a 54 y.o. female presenting to hospital 7/8 with several episodes of hematemesis.  Pt admitted with upper GI bleed and interstitial lung disease.  S/p EGD 7/8 with esophageal banding.  PMH includes h/o varices, h/o cirrhosis, DM, Hep C, htn, ORIF tibia plateau L 2017.  Clinical Impression  Prior to hospital admission, pt was modified independent ambulating with quad cane; 2 L home O2 chronic; lives with family on main level of home with ramp to enter.  Currently pt is independent with transfers and modified independent ambulating with quad cane 200 feet.  O2 sats 91-92% on 2 L O2 via nasal cannula with ambulation; 95% on 2 L at rest.  Pt appears close to self reported functional baseline; no acute PT needs identified--pt in agreement; will sign off.  Discussed PT recommendations with pt's nurse and care management.    Follow Up Recommendations No PT follow up    Equipment Recommendations  None recommended by PT (pt has own quad cane at home)    Recommendations for Other Services       Precautions / Restrictions Precautions Precautions: Fall Restrictions Weight Bearing Restrictions: No      Mobility  Bed Mobility               General bed mobility comments: Deferred (pt sitting on edge of bed at beginning and end of session)  Transfers Overall transfer level: Independent Equipment used: None             General transfer comment: steady safe transfers from bed noted  Ambulation/Gait Ambulation/Gait assistance: Modified independent (Device/Increase time) Gait Distance (Feet): 200 Feet Assistive device: Quad cane   Gait velocity: very mildly decreased   General Gait Details: mild decreased stance time L LE; mild increased BOS and B lateral sway but overall steady  Stairs Stairs:  (Deferred (pt uses ramp  at home))          Wheelchair Mobility    Modified Rankin (Stroke Patients Only)       Balance Overall balance assessment: Needs assistance Sitting-balance support: No upper extremity supported;Feet supported Sitting balance-Leahy Scale: Normal Sitting balance - Comments: steady sitting reaching outside BOS   Standing balance support: No upper extremity supported Standing balance-Leahy Scale: Normal Standing balance comment: steady standing reaching outside BOS                             Pertinent Vitals/Pain Pain Assessment: Faces Faces Pain Scale: Hurts a little bit Pain Location: L lower leg pain (chronic) Pain Descriptors / Indicators: Sore Pain Intervention(s): Limited activity within patient's tolerance;Monitored during session;Repositioned  HR WFL during sessions activities.    Home Living Family/patient expects to be discharged to:: Private residence Living Arrangements: Children;Other relatives (daughter;  brother (who is bedridden but able to pivot transfer per pt report)) Available Help at Discharge: Family Type of Home: House Home Access: Ramped entrance     Home Layout: Two level;Able to live on main level with bedroom/bathroom Home Equipment: Cane - quad;Grab bars - tub/shower;Bedside commode      Prior Function Level of Independence: Independent with assistive device(s)         Comments: Ambulatory with quad cane.  No falls in past 6 months.     Hand Dominance  Extremity/Trunk Assessment   Upper Extremity Assessment Upper Extremity Assessment: Generalized weakness    Lower Extremity Assessment Lower Extremity Assessment: Generalized weakness    Cervical / Trunk Assessment Cervical / Trunk Assessment: Normal  Communication   Communication: No difficulties  Cognition Arousal/Alertness: Awake/alert Behavior During Therapy: WFL for tasks assessed/performed Overall Cognitive Status: Within Functional Limits for  tasks assessed                                        General Comments   Nursing cleared pt for participation in physical therapy.  Pt agreeable to PT session.    Exercises     Assessment/Plan    PT Assessment Patent does not need any further PT services  PT Problem List         PT Treatment Interventions      PT Goals (Current goals can be found in the Care Plan section)  Acute Rehab PT Goals Patient Stated Goal: to go home PT Goal Formulation: With patient Time For Goal Achievement: 11/22/19 Potential to Achieve Goals: Good    Frequency     Barriers to discharge        Co-evaluation               AM-PAC PT "6 Clicks" Mobility  Outcome Measure Help needed turning from your back to your side while in a flat bed without using bedrails?: None Help needed moving from lying on your back to sitting on the side of a flat bed without using bedrails?: None Help needed moving to and from a bed to a chair (including a wheelchair)?: None Help needed standing up from a chair using your arms (e.g., wheelchair or bedside chair)?: None Help needed to walk in hospital room?: None Help needed climbing 3-5 steps with a railing? : None 6 Click Score: 24    End of Session Equipment Utilized During Treatment: Gait belt Activity Tolerance: Patient tolerated treatment well Patient left: with call bell/phone within reach (sitting on edge of bed) Nurse Communication: Mobility status;Precautions PT Visit Diagnosis: Muscle weakness (generalized) (M62.81)    Time: 2836-6294 PT Time Calculation (min) (ACUTE ONLY): 25 min   Charges:   PT Evaluation $PT Eval Low Complexity: 1 Low         Jadis Mika, PT 11/08/19, 5:05 PM

## 2019-11-08 NOTE — TOC Transition Note (Signed)
Transition of Care [facility]) - CM/SW Discharge Note   Patient Details  Name: [name] MRN: [id number] Date of Birth: May 19, 1965  [facility]) CM/SW Contact:  Su Hilt, RN Phone Number: 11/08/2019, 4:19 PM   Clinical Narrative:    Met with the patient to discuss needs and plan, she has Oxygen at home that she uses continuously, She has a quad cane at home and does not need additional DME, She was provided with substance abuse resources, she has transportation and is up to date with her PCP, she can afford her medicaiton   Final next level of care: Home/Self Care Barriers to Discharge: Barriers Resolved   Patient Goals and CMS Choice Patient states their goals for this hospitalization and ongoing recovery are:: go home      Discharge Placement                       Discharge Plan and Services   Discharge Planning Services: CM Consult            DME Arranged: N/A         HH Arranged: NA          Social Determinants of Health (SDOH) Interventions     Readmission Risk Interventions No flowsheet data found.

## 2019-11-25 ENCOUNTER — Encounter: Payer: Medicaid Other | Attending: Pulmonary Disease | Admitting: *Deleted

## 2019-11-25 ENCOUNTER — Other Ambulatory Visit: Payer: Self-pay

## 2019-11-25 DIAGNOSIS — J849 Interstitial pulmonary disease, unspecified: Secondary | ICD-10-CM

## 2019-11-25 NOTE — Progress Notes (Signed)
Virtual orientation call completed today. shehas an appointment on Date: 11/29/2019 for EP eval and gym Orientation.  Documentation of diagnosis can be found in [facility] Date: 07223/2021.

## 2019-12-06 ENCOUNTER — Ambulatory Visit: Payer: Medicaid Other

## 2019-12-21 ENCOUNTER — Ambulatory Visit: Payer: Medicaid Other | Admitting: Podiatry

## 2020-01-14 ENCOUNTER — Ambulatory Visit: Payer: Medicaid Other | Admitting: Podiatry

## 2020-01-20 ENCOUNTER — Emergency Department: Payer: Medicaid Other

## 2020-01-20 ENCOUNTER — Encounter: Payer: Self-pay | Admitting: *Deleted

## 2020-01-20 ENCOUNTER — Other Ambulatory Visit: Payer: Self-pay

## 2020-01-20 DIAGNOSIS — F418 Other specified anxiety disorders: Secondary | ICD-10-CM | POA: Diagnosis present

## 2020-01-20 DIAGNOSIS — E872 Acidosis: Secondary | ICD-10-CM | POA: Clinically undetermined

## 2020-01-20 DIAGNOSIS — Z23 Encounter for immunization: Secondary | ICD-10-CM

## 2020-01-20 DIAGNOSIS — E782 Mixed hyperlipidemia: Secondary | ICD-10-CM | POA: Diagnosis present

## 2020-01-20 DIAGNOSIS — J9622 Acute and chronic respiratory failure with hypercapnia: Secondary | ICD-10-CM | POA: Diagnosis present

## 2020-01-20 DIAGNOSIS — E119 Type 2 diabetes mellitus without complications: Secondary | ICD-10-CM | POA: Diagnosis present

## 2020-01-20 DIAGNOSIS — F17210 Nicotine dependence, cigarettes, uncomplicated: Secondary | ICD-10-CM | POA: Diagnosis present

## 2020-01-20 DIAGNOSIS — T502X5A Adverse effect of carbonic-anhydrase inhibitors, benzothiadiazides and other diuretics, initial encounter: Secondary | ICD-10-CM | POA: Diagnosis not present

## 2020-01-20 DIAGNOSIS — I110 Hypertensive heart disease with heart failure: Principal | ICD-10-CM | POA: Diagnosis present

## 2020-01-20 DIAGNOSIS — N179 Acute kidney failure, unspecified: Secondary | ICD-10-CM | POA: Diagnosis not present

## 2020-01-20 DIAGNOSIS — Z9981 Dependence on supplemental oxygen: Secondary | ICD-10-CM

## 2020-01-20 DIAGNOSIS — Z79899 Other long term (current) drug therapy: Secondary | ICD-10-CM

## 2020-01-20 DIAGNOSIS — J449 Chronic obstructive pulmonary disease, unspecified: Secondary | ICD-10-CM | POA: Diagnosis present

## 2020-01-20 DIAGNOSIS — Z8249 Family history of ischemic heart disease and other diseases of the circulatory system: Secondary | ICD-10-CM

## 2020-01-20 DIAGNOSIS — E662 Morbid (severe) obesity with alveolar hypoventilation: Secondary | ICD-10-CM | POA: Diagnosis present

## 2020-01-20 DIAGNOSIS — Y92239 Unspecified place in hospital as the place of occurrence of the external cause: Secondary | ICD-10-CM | POA: Diagnosis not present

## 2020-01-20 DIAGNOSIS — Z7951 Long term (current) use of inhaled steroids: Secondary | ICD-10-CM

## 2020-01-20 DIAGNOSIS — J849 Interstitial pulmonary disease, unspecified: Secondary | ICD-10-CM | POA: Diagnosis present

## 2020-01-20 DIAGNOSIS — B182 Chronic viral hepatitis C: Secondary | ICD-10-CM | POA: Diagnosis present

## 2020-01-20 DIAGNOSIS — Z8711 Personal history of peptic ulcer disease: Secondary | ICD-10-CM

## 2020-01-20 DIAGNOSIS — Z833 Family history of diabetes mellitus: Secondary | ICD-10-CM

## 2020-01-20 DIAGNOSIS — Z6841 Body Mass Index (BMI) 40.0 and over, adult: Secondary | ICD-10-CM

## 2020-01-20 DIAGNOSIS — Z794 Long term (current) use of insulin: Secondary | ICD-10-CM

## 2020-01-20 DIAGNOSIS — D649 Anemia, unspecified: Secondary | ICD-10-CM | POA: Diagnosis present

## 2020-01-20 DIAGNOSIS — F4310 Post-traumatic stress disorder, unspecified: Secondary | ICD-10-CM | POA: Diagnosis present

## 2020-01-20 DIAGNOSIS — E876 Hypokalemia: Secondary | ICD-10-CM | POA: Diagnosis present

## 2020-01-20 DIAGNOSIS — Z9049 Acquired absence of other specified parts of digestive tract: Secondary | ICD-10-CM

## 2020-01-20 DIAGNOSIS — K7460 Unspecified cirrhosis of liver: Secondary | ICD-10-CM | POA: Diagnosis present

## 2020-01-20 DIAGNOSIS — I2721 Secondary pulmonary arterial hypertension: Secondary | ICD-10-CM | POA: Diagnosis present

## 2020-01-20 DIAGNOSIS — Z888 Allergy status to other drugs, medicaments and biological substances status: Secondary | ICD-10-CM

## 2020-01-20 DIAGNOSIS — K7290 Hepatic failure, unspecified without coma: Secondary | ICD-10-CM | POA: Diagnosis present

## 2020-01-20 DIAGNOSIS — J9621 Acute and chronic respiratory failure with hypoxia: Secondary | ICD-10-CM | POA: Diagnosis present

## 2020-01-20 DIAGNOSIS — G8929 Other chronic pain: Secondary | ICD-10-CM | POA: Diagnosis present

## 2020-01-20 DIAGNOSIS — I5033 Acute on chronic diastolic (congestive) heart failure: Secondary | ICD-10-CM | POA: Diagnosis present

## 2020-01-20 DIAGNOSIS — Z20822 Contact with and (suspected) exposure to covid-19: Secondary | ICD-10-CM | POA: Diagnosis present

## 2020-01-20 DIAGNOSIS — I959 Hypotension, unspecified: Secondary | ICD-10-CM | POA: Diagnosis present

## 2020-01-20 LAB — CBC
HCT: 38 % (ref 36.0–46.0)
Hemoglobin: 11.8 g/dL — ABNORMAL LOW (ref 12.0–15.0)
MCH: 26 pg (ref 26.0–34.0)
MCHC: 31.1 g/dL (ref 30.0–36.0)
MCV: 83.9 fL (ref 80.0–100.0)
Platelets: 142 10*3/uL — ABNORMAL LOW (ref 150–400)
RBC: 4.53 MIL/uL (ref 3.87–5.11)
RDW: 19.7 % — ABNORMAL HIGH (ref 11.5–15.5)
WBC: 8.4 10*3/uL (ref 4.0–10.5)
nRBC: 0 % (ref 0.0–0.2)

## 2020-01-20 LAB — BASIC METABOLIC PANEL
Anion gap: 10 (ref 5–15)
BUN: 13 mg/dL (ref 6–20)
CO2: 28 mmol/L (ref 22–32)
Calcium: 8.2 mg/dL — ABNORMAL LOW (ref 8.9–10.3)
Chloride: 100 mmol/L (ref 98–111)
Creatinine, Ser: 0.73 mg/dL (ref 0.44–1.00)
GFR calc Af Amer: >60 mL/min (ref >60)
GFR calc non Af Amer: >60 mL/min (ref >60)
Glucose, Bld: 96 mg/dL (ref 70–99)
Potassium: 3.4 mmol/L — ABNORMAL LOW (ref 3.5–5.1)
Sodium: 138 mmol/L (ref 135–145)

## 2020-01-20 LAB — TROPONIN I (HIGH SENSITIVITY): Troponin I (High Sensitivity): 7 ng/L (ref <18)

## 2020-01-20 NOTE — ED Triage Notes (Addendum)
Pt to triage via wheelchair.  Pt has swelling to legs, hands for 2 weeks.  Pt has sob.  Pt is on 2 liters oxygen.  cig smoker.    No chest pain.  Pt alert   Speech clear.

## 2020-01-21 ENCOUNTER — Inpatient Hospital Stay
Admission: EM | Admit: 2020-01-21 | Discharge: 2020-01-26 | DRG: 291 | Disposition: A | Discharge status: Home / Self Care | Payer: Medicaid Other | Attending: Internal Medicine | Admitting: Internal Medicine

## 2020-01-21 ENCOUNTER — Observation Stay
Admit: 2020-01-21 | Discharge: 2020-01-21 | Disposition: A | Discharge status: Home / Self Care | Payer: Medicaid Other | Attending: Family Medicine | Admitting: Family Medicine

## 2020-01-21 ENCOUNTER — Other Ambulatory Visit: Payer: Self-pay

## 2020-01-21 DIAGNOSIS — Z794 Long term (current) use of insulin: Secondary | ICD-10-CM

## 2020-01-21 DIAGNOSIS — G4733 Obstructive sleep apnea (adult) (pediatric): Secondary | ICD-10-CM | POA: Diagnosis present

## 2020-01-21 DIAGNOSIS — E785 Hyperlipidemia, unspecified: Secondary | ICD-10-CM | POA: Diagnosis present

## 2020-01-21 DIAGNOSIS — I5031 Acute diastolic (congestive) heart failure: Secondary | ICD-10-CM | POA: Diagnosis not present

## 2020-01-21 DIAGNOSIS — R0902 Hypoxemia: Secondary | ICD-10-CM

## 2020-01-21 DIAGNOSIS — J9601 Acute respiratory failure with hypoxia: Secondary | ICD-10-CM

## 2020-01-21 DIAGNOSIS — R918 Other nonspecific abnormal finding of lung field: Secondary | ICD-10-CM

## 2020-01-21 DIAGNOSIS — I509 Heart failure, unspecified: Secondary | ICD-10-CM

## 2020-01-21 DIAGNOSIS — F418 Other specified anxiety disorders: Secondary | ICD-10-CM | POA: Diagnosis present

## 2020-01-21 DIAGNOSIS — J849 Interstitial pulmonary disease, unspecified: Secondary | ICD-10-CM

## 2020-01-21 DIAGNOSIS — N179 Acute kidney failure, unspecified: Secondary | ICD-10-CM

## 2020-01-21 DIAGNOSIS — I10 Essential (primary) hypertension: Secondary | ICD-10-CM | POA: Diagnosis present

## 2020-01-21 DIAGNOSIS — I5033 Acute on chronic diastolic (congestive) heart failure: Secondary | ICD-10-CM | POA: Diagnosis present

## 2020-01-21 DIAGNOSIS — B182 Chronic viral hepatitis C: Secondary | ICD-10-CM | POA: Diagnosis present

## 2020-01-21 DIAGNOSIS — E119 Type 2 diabetes mellitus without complications: Secondary | ICD-10-CM | POA: Diagnosis not present

## 2020-01-21 DIAGNOSIS — E876 Hypokalemia: Secondary | ICD-10-CM | POA: Diagnosis present

## 2020-01-21 DIAGNOSIS — J449 Chronic obstructive pulmonary disease, unspecified: Secondary | ICD-10-CM | POA: Diagnosis present

## 2020-01-21 HISTORY — DX: Unspecified cirrhosis of liver: K74.60

## 2020-01-21 HISTORY — DX: Other ill-defined heart diseases: I51.89

## 2020-01-21 HISTORY — DX: Unspecified asthma, uncomplicated: J45.909

## 2020-01-21 HISTORY — DX: Morbid (severe) obesity due to excess calories: E66.01

## 2020-01-21 HISTORY — DX: Interstitial pulmonary disease, unspecified: J84.9

## 2020-01-21 HISTORY — DX: Hematemesis: K92.0

## 2020-01-21 HISTORY — DX: Esophageal varices without bleeding: I85.00

## 2020-01-21 HISTORY — DX: Post-traumatic stress disorder, unspecified: F43.10

## 2020-01-21 HISTORY — DX: Unspecified viral hepatitis C without hepatic coma: B19.20

## 2020-01-21 HISTORY — DX: Tobacco use: Z72.0

## 2020-01-21 HISTORY — DX: Chronic obstructive pulmonary disease, unspecified: J44.9

## 2020-01-21 HISTORY — DX: Essential (primary) hypertension: I10

## 2020-01-21 HISTORY — DX: Mixed hyperlipidemia: E78.2

## 2020-01-21 LAB — RESPIRATORY PANEL BY RT PCR (FLU A&B, COVID)
Influenza A by PCR: NEGATIVE
Influenza B by PCR: NEGATIVE
SARS Coronavirus 2 by RT PCR: NEGATIVE

## 2020-01-21 LAB — HEPATIC FUNCTION PANEL
ALT: 29 U/L (ref 0–44)
AST: 26 U/L (ref 15–41)
Albumin: 3.1 g/dL — ABNORMAL LOW (ref 3.5–5.0)
Alkaline Phosphatase: 61 U/L (ref 38–126)
Bilirubin, Direct: 0.2 mg/dL (ref 0.0–0.2)
Indirect Bilirubin: 0.6 mg/dL (ref 0.3–0.9)
Total Bilirubin: 0.8 mg/dL (ref 0.3–1.2)
Total Protein: 7.6 g/dL (ref 6.5–8.1)

## 2020-01-21 LAB — TROPONIN I (HIGH SENSITIVITY): Troponin I (High Sensitivity): 7 ng/L (ref <18)

## 2020-01-21 LAB — BRAIN NATRIURETIC PEPTIDE: B Natriuretic Peptide: 111.6 pg/mL — ABNORMAL HIGH (ref 0.0–100.0)

## 2020-01-21 LAB — GLUCOSE, CAPILLARY: Glucose-Capillary: 93 mg/dL (ref 70–99)

## 2020-01-21 MED ORDER — KETOROLAC TROMETHAMINE 30 MG/ML IJ SOLN: 15.0000 mg | Freq: Once | INTRAMUSCULAR | Status: DC

## 2020-01-21 MED ORDER — ALPRAZOLAM 0.25 MG PO TABS: 0.2500 mg | ORAL_TABLET | Freq: Two times a day (BID) | ORAL | Status: DC | PRN

## 2020-01-21 MED ORDER — ENOXAPARIN SODIUM 40 MG/0.4ML ~~LOC~~ SOLN
40.0000 mg | Freq: Two times a day (BID) | SUBCUTANEOUS | Status: DC
  Administered 2020-01-22 – 2020-01-24 (×6): 40 mg via SUBCUTANEOUS
  Filled 2020-01-21 (×10): qty 0.4

## 2020-01-21 MED ORDER — ALBUTEROL SULFATE HFA 108 (90 BASE) MCG/ACT IN AERS: 1.0000 | INHALATION_SPRAY | Freq: Four times a day (QID) | RESPIRATORY_TRACT | Status: DC | PRN

## 2020-01-21 MED ORDER — SODIUM CHLORIDE 0.9% FLUSH
3.0000 mL | Freq: Two times a day (BID) | INTRAVENOUS | Status: DC
  Administered 2020-01-21 – 2020-01-26 (×8): 3 mL via INTRAVENOUS

## 2020-01-21 MED ORDER — ZOLPIDEM TARTRATE 5 MG PO TABS: 5.0000 mg | ORAL_TABLET | Freq: Every evening | ORAL | Status: DC | PRN

## 2020-01-21 MED ORDER — DICLOFENAC SODIUM 1 % EX GEL
1.0000 "application " | Freq: Four times a day (QID) | CUTANEOUS | Status: DC | PRN
  Filled 2020-01-21: qty 100

## 2020-01-21 MED ORDER — SODIUM CHLORIDE 0.9 % IV SOLN
250.0000 mL | INTRAVENOUS | Status: DC | PRN
  Administered 2020-01-23: 250 mL via INTRAVENOUS

## 2020-01-21 MED ORDER — ONDANSETRON HCL 4 MG/2ML IJ SOLN: 4.0000 mg | Freq: Four times a day (QID) | INTRAMUSCULAR | Status: DC | PRN

## 2020-01-21 MED ORDER — POTASSIUM CHLORIDE 20 MEQ PO PACK
40.0000 meq | PACK | Freq: Once | ORAL | Status: AC
  Administered 2020-01-21: 40 meq via ORAL
  Filled 2020-01-21: qty 2

## 2020-01-21 MED ORDER — FLUTICASONE PROPIONATE 50 MCG/ACT NA SUSP
2.0000 | Freq: Every day | NASAL | Status: DC
  Administered 2020-01-21 – 2020-01-25 (×3): 2 via NASAL
  Filled 2020-01-21: qty 16

## 2020-01-21 MED ORDER — NICOTINE 21 MG/24HR TD PT24
21.0000 mg | MEDICATED_PATCH | Freq: Every day | TRANSDERMAL | Status: DC
  Administered 2020-01-21 – 2020-01-25 (×5): 21 mg via TRANSDERMAL
  Filled 2020-01-21 (×6): qty 1

## 2020-01-21 MED ORDER — ENOXAPARIN SODIUM 40 MG/0.4ML ~~LOC~~ SOLN: 40.0000 mg | SUBCUTANEOUS | Status: DC

## 2020-01-21 MED ORDER — ALBUTEROL SULFATE (2.5 MG/3ML) 0.083% IN NEBU: 2.5000 mg | INHALATION_SOLUTION | RESPIRATORY_TRACT | Status: DC | PRN

## 2020-01-21 MED ORDER — HYDROXYZINE HCL 10 MG PO TABS
10.0000 mg | ORAL_TABLET | Freq: Three times a day (TID) | ORAL | Status: DC | PRN
  Filled 2020-01-21: qty 1

## 2020-01-21 MED ORDER — TRIAMCINOLONE ACETONIDE 0.5 % EX OINT
1.0000 "application " | TOPICAL_OINTMENT | Freq: Two times a day (BID) | CUTANEOUS | Status: DC
  Administered 2020-01-22 – 2020-01-26 (×5): 1 via TOPICAL
  Filled 2020-01-21 (×2): qty 15

## 2020-01-21 MED ORDER — LOSARTAN POTASSIUM 50 MG PO TABS
100.0000 mg | ORAL_TABLET | Freq: Every day | ORAL | Status: DC
  Administered 2020-01-21 – 2020-01-22 (×2): 100 mg via ORAL
  Filled 2020-01-21 (×2): qty 2

## 2020-01-21 MED ORDER — TIOTROPIUM BROMIDE MONOHYDRATE 18 MCG IN CAPS
1.0000 | ORAL_CAPSULE | Freq: Every day | RESPIRATORY_TRACT | Status: DC
  Administered 2020-01-21 – 2020-01-22 (×2): 18 ug via RESPIRATORY_TRACT
  Filled 2020-01-21: qty 5

## 2020-01-21 MED ORDER — MORPHINE SULFATE (PF) 2 MG/ML IV SOLN
2.0000 mg | Freq: Once | INTRAVENOUS | Status: AC
  Administered 2020-01-21: 2 mg via INTRAVENOUS
  Filled 2020-01-21: qty 1

## 2020-01-21 MED ORDER — LACTULOSE 10 GM/15ML PO SOLN
20.0000 g | Freq: Two times a day (BID) | ORAL | Status: DC
  Administered 2020-01-22 – 2020-01-24 (×2): 20 g via ORAL
  Filled 2020-01-21 (×7): qty 30

## 2020-01-21 MED ORDER — NALOXONE HCL 4 MG/0.1ML NA LIQD
1.0000 | Freq: Once | NASAL | Status: DC | PRN
  Filled 2020-01-21: qty 8

## 2020-01-21 MED ORDER — FUROSEMIDE 10 MG/ML IJ SOLN
40.0000 mg | Freq: Two times a day (BID) | INTRAMUSCULAR | Status: DC
  Administered 2020-01-22 (×2): 40 mg via INTRAVENOUS
  Filled 2020-01-21 (×2): qty 4

## 2020-01-21 MED ORDER — SODIUM CHLORIDE 0.9% FLUSH: 3.0000 mL | INTRAVENOUS | Status: DC | PRN

## 2020-01-21 MED ORDER — ATORVASTATIN CALCIUM 10 MG PO TABS
10.0000 mg | ORAL_TABLET | Freq: Every day | ORAL | Status: DC
  Administered 2020-01-21 – 2020-01-25 (×5): 10 mg via ORAL
  Filled 2020-01-21 (×5): qty 1

## 2020-01-21 MED ORDER — LIDOCAINE 5 % EX OINT
TOPICAL_OINTMENT | Freq: Two times a day (BID) | CUTANEOUS | Status: DC | PRN
  Filled 2020-01-21 (×2): qty 35.44

## 2020-01-21 MED ORDER — PANTOPRAZOLE SODIUM 40 MG PO TBEC
40.0000 mg | DELAYED_RELEASE_TABLET | Freq: Two times a day (BID) | ORAL | Status: DC
  Administered 2020-01-21 – 2020-01-26 (×8): 40 mg via ORAL
  Filled 2020-01-21 (×9): qty 1

## 2020-01-21 MED ORDER — OXYCODONE HCL ER 10 MG PO T12A
20.0000 mg | EXTENDED_RELEASE_TABLET | Freq: Two times a day (BID) | ORAL | Status: DC
  Administered 2020-01-21 – 2020-01-26 (×7): 20 mg via ORAL
  Filled 2020-01-21 (×2): qty 1
  Filled 2020-01-21: qty 2
  Filled 2020-01-21: qty 1
  Filled 2020-01-21 (×4): qty 2

## 2020-01-21 MED ORDER — BUSPIRONE HCL 5 MG PO TABS
5.0000 mg | ORAL_TABLET | Freq: Every day | ORAL | Status: DC
  Administered 2020-01-21 – 2020-01-25 (×4): 5 mg via ORAL
  Filled 2020-01-21 (×7): qty 1

## 2020-01-21 MED ORDER — INSULIN ASPART 100 UNIT/ML ~~LOC~~ SOLN
0.0000 [IU] | Freq: Three times a day (TID) | SUBCUTANEOUS | Status: DC
  Administered 2020-01-22 – 2020-01-24 (×3): 1 [IU] via SUBCUTANEOUS
  Administered 2020-01-25 (×2): 2 [IU] via SUBCUTANEOUS
  Administered 2020-01-25 – 2020-01-26 (×2): 1 [IU] via SUBCUTANEOUS
  Filled 2020-01-21 (×7): qty 1

## 2020-01-21 MED ORDER — ESCITALOPRAM OXALATE 10 MG PO TABS
10.0000 mg | ORAL_TABLET | Freq: Every day | ORAL | Status: DC
  Administered 2020-01-21 – 2020-01-26 (×4): 10 mg via ORAL
  Filled 2020-01-21 (×6): qty 1

## 2020-01-21 MED ORDER — MONTELUKAST SODIUM 10 MG PO TABS
10.0000 mg | ORAL_TABLET | Freq: Every day | ORAL | Status: DC
  Administered 2020-01-21 – 2020-01-25 (×5): 10 mg via ORAL
  Filled 2020-01-21 (×5): qty 1

## 2020-01-21 MED ORDER — FUROSEMIDE 10 MG/ML IJ SOLN
20.0000 mg | Freq: Once | INTRAMUSCULAR | Status: AC
  Administered 2020-01-21: 20 mg via INTRAVENOUS
  Filled 2020-01-21: qty 4

## 2020-01-21 MED ORDER — ACETAMINOPHEN 325 MG PO TABS: 650.0000 mg | ORAL_TABLET | ORAL | Status: DC | PRN

## 2020-01-21 NOTE — ED Notes (Signed)
Ambulated pt from room 5 to room 10 in ED approx 40 feet. Pt O2 sats dropped to 88% on 2L [place] after she stopped in to use bathroom. Pt's HR maxed at 110. Pt reported feeling "okay" but was observed to have increased WOB.

## 2020-01-21 NOTE — Progress Notes (Signed)
PHARMACIST - PHYSICIAN COMMUNICATION  CONCERNING:  Enoxaparin (Lovenox) for DVT Prophylaxis    RECOMMENDATION: Patient was prescribed enoxaprin 40mg  q24 hours for VTE prophylaxis.   Filed Weights   01/20/20 1946  Weight: 101.6 kg (224 lb)    Body mass index is 40.97 kg/m.  Estimated Creatinine Clearance: 89.7 mL/min (by C-G formula based on SCr of 0.73 mg/dL).   Based on [facility] policy patient is candidate for enoxaparin 40mg  every 12 hours due to BMI being >40.   DESCRIPTION: Pharmacy has adjusted enoxaparin dose per [facility] policy.  Patient is now receiving enoxaparin 40mg  every 12 hours    , PharmD Clinical Pharmacist  01/21/2020 8:17 PM

## 2020-01-21 NOTE — ED Provider Notes (Signed)
[facility] Emergency Department Provider Note   ____________________________________________   First MD Initiated Contact with Patient 01/21/20 1153     (approximate)  I have reviewed the triage vital signs and the nursing notes.   HISTORY  Chief Complaint Leg Swelling    HPI [name] is a 54 y.o. female patient reports she is more short of breath than usual.  She has some swelling to her legs been getting worse for about 2 weeks.  She may have some swelling to her hands to.  She has had no chest pain or tightness.  She is not coughing up any colored phlegm she is coughing up some clear liquidy phlegm.  She has no fever.        Past Medical History:  Diagnosis Date  . Bleeding disorder ([place])   . Diabetes mellitus without complication ([place])   . GI bleed   . Hepatitis C, acute   . Hypertension   . O2 dependent   . Sleep apnea     Patient Active Problem List   Diagnosis Date Noted  . Hematemesis 11/04/2019  . Acute upper GI bleed 11/04/2019  . UGIB (upper gastrointestinal bleed) 11/04/2019  . Diabetes mellitus without complication ([place])   . Hypertension   . Hepatitis C, chronic ([place])   . Alcohol dependence ([place])   . Melena   . Acute gastric ulcer with hemorrhage   . Dyspnea on exertion 10/31/2017  . Encounter for monitoring immunomodulating therapy 06/20/2017  . 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 Encephalopathy pulmonary 01/02/2017  . Interstitial lung disease ([place]) 01/02/2017  . Other cirrhosis of liver ([place]) 01/02/2017  . Acute on chronic respiratory failure with hypercapnia ([place]) 12/27/2016  . Long term prescription benzodiazepine use 01/24/2016  . Neuritis of left sural nerve 01/24/2016  . Traumatic leg injury 01/24/2016  . Tobacco use disorder 08/10/2015  . Tibial plateau fracture 07/02/2015  . Gallstones 04/14/2013  . Pancreatitis 04/14/2013  . Obesity 03/05/2013    Past Surgical History:    Procedure Laterality Date  . ESOPHAGOGASTRODUODENOSCOPY (EGD) WITH PROPOFOL N/A 11/04/2019   Procedure: ESOPHAGOGASTRODUODENOSCOPY (EGD) WITH PROPOFOL;  Surgeon: Lucilla Lame, MD;  [facility]: [facility] ENDOSCOPY;  Service: Endoscopy;  Laterality: N/A;  . ORIF TIBIA PLATEAU Left 07/02/2015   Procedure: OPEN REDUCTION INTERNAL FIXATION (ORIF) TIBIAL PLATEAU;  Surgeon: Thornton Park, MD;  Location: [facility] ORS;  Service: Orthopedics;  Laterality: Left;    Prior to Admission medications   Medication Sig Start Date End Date Taking? Authorizing Provider  albuterol (PROVENTIL HFA;VENTOLIN HFA) 108 (90 Base) MCG/ACT inhaler Inhale into the lungs. 03/10/13   [provider]  atorvastatin (LIPITOR) 10 MG tablet Take by mouth. 04/24/18   [provider]  bacitracin 500 UNIT/GM ointment Apply topically. 04/24/18   [provider]  budesonide-formoterol (SYMBICORT) 160-4.5 MCG/ACT inhaler Inhale into the lungs. 07/02/19 07/01/20  [provider]  busPIRone (BUSPAR) 5 MG tablet Take 5 mg by mouth at bedtime. 09/10/19   [provider]  cyclobenzaprine (FLEXERIL) 10 MG tablet Take 1 tablet by mouth daily in the afternoon. Patient not taking: Reported on 11/25/2019 08/10/19   [provider]  diclofenac Sodium (VOLTAREN) 1 % GEL Apply 1 application topically every 6 (six) hours as needed. 07/06/19   [provider]  diphenhydrAMINE (BENADRYL) 25 mg capsule Take by mouth. 10/16/18   [provider]  doxycycline (MONODOX) 100 MG capsule Take 100 mg by mouth 2 (two) times daily. 12/06/19  [provider]  ENULOSE 10 GM/15ML SOLN Take by mouth. 12/20/19   [provider]  escitalopram (LEXAPRO) 5 MG tablet Take 5 mg by mouth at bedtime. Patient not taking: Reported on 11/25/2019 09/10/19   [provider]  fluticasone (FLONASE) 50 MCG/ACT nasal spray Place 2 sprays into both nostrils daily.    [provider]  furosemide (LASIX)  20 MG tablet TAKE 1 TABLET BY MOUTH EVERY MORNING FOR FLUID RETENTION X 3 DAYS 08/14/16   [provider]  HYDROcodone-acetaminophen (NORCO/VICODIN) 5-325 MG tablet hydrocodone 5 mg-acetaminophen 325 mg tablet  TAKE 1 TABLET BY MOUTH EVERY 4 HOURS AS NEEDED FOR PAIN    [provider]  hydrocortisone (PROCTOSOL HC) 2.5 % rectal cream APPLY TO AFFECTED AREA UP TO 3 TO 4 TIMES DAILY FOR HEMORRHOIDS 11/18/16   [provider]  hydrOXYzine (ATARAX/VISTARIL) 10 MG tablet Take 10 mg by mouth 3 (three) times daily as needed. 12/10/19   [provider]  insulin detemir (LEVEMIR) 100 UNIT/ML injection Inject 10-20 Units into the skin 2 (two) times daily. 20 units in morning and 10 units in evening (may add additional 20 units in evening depending on blood sugar)    [provider]  lactulose (CHRONULAC) 10 GM/15ML solution Take 20 g by mouth daily. 07/06/19   [provider]  lidocaine (XYLOCAINE) 5 % ointment APPLY 1 APPLICATION TOPICALLY TWO (2) TIMES A DAY. APPLY 5 GRAMS TO AREA UP TO TWICE DAILY 08/16/16   [provider]  loratadine (CLARITIN) 10 MG tablet Take 10 mg by mouth daily. 07/02/19   [provider]  losartan (COZAAR) 100 MG tablet Take 100 mg by mouth daily. 07/27/19   [provider]  montelukast (SINGULAIR) 10 MG tablet Take by mouth. 11/19/19 11/18/20  [provider]  naloxone (NARCAN) 4 MG/0.1ML LIQD nasal spray kit PLEASE SEE ATTACHED FOR DETAILED DIRECTIONS 08/18/18   [provider]  nicotine (NICODERM CQ - DOSED IN MG/24 HOURS) 21 mg/24hr patch as directed. 11/15/19   [provider]  omeprazole (PRILOSEC) 20 MG capsule Take 20 mg by mouth 2 (two) times daily. 08/23/19   [provider]  oxycodone (OXY-IR) 5 MG capsule Take by mouth.    [provider]  pantoprazole (PROTONIX) 40 MG tablet Take 1 tablet (40 mg total) by mouth 2 (two) times daily. 11/08/19 12/08/19  Harold Hedge, MD  predniSONE (DELTASONE) 10 MG tablet Take 40 mg by mouth daily. 12/06/19   [provider]  SPIRIVA RESPIMAT 2.5 MCG/ACT AERS Inhale 2 puffs into the lungs daily in the afternoon. 07/22/19   [provider]  triamcinolone ointment (KENALOG) 0.5 % Apply 1 application topically 2 (two) times daily. 08/23/19   [provider]    Allergies Other, Mycophenolate sodium, Azathioprine, Mycophenolate mofetil, and Tacrolimus  No family history on file.  Social History Social History   Tobacco Use  . Smoking status: Current Every Day Smoker    Packs/day: 0.25    Years: 36.00    Pack years: 9.00    Types: Cigarettes  . Smokeless tobacco: Never Used  Substance Use Topics  . Alcohol use: Yes    Alcohol/week: 0.0 standard drinks  . Drug use: Not on file    Review of Systems  Constitutional: No fever/chills Eyes: No visual changes. ENT: No sore throat. Cardiovascular: Denies chest pain. Respiratory: Some shortness of breath. Gastrointestinal: No abdominal pain.  No nausea, no vomiting.  No diarrhea.  No  constipation. Genitourinary: Negative for dysuria. Musculoskeletal: Negative for back pain. Skin: Negative for rash. Neurological: Negative for headaches, focal weakness   ____________________________________________   PHYSICAL EXAM:  VITAL SIGNS: ED Triage Vitals  Enc Vitals Group     BP 01/20/20 1945 (!) 125/3     Pulse Rate 01/20/20 1945 (!) 102     Resp 01/20/20 1945 (!) 22     Temp 01/20/20 1945 99.2 F (37.3 C)     Temp Source 01/20/20 1945 Oral     SpO2 01/20/20 1945 93 %     Weight 01/20/20 1946 224 lb (101.6 kg)     Height 01/20/20 1946 _0  (1.575 m)     Head Circumference --      Peak Flow --      Pain Score 01/20/20 1946 10     Pain Loc --      Pain Edu? --      Excl. in [place]? --     Constitutional: Alert and oriented. Well appearing and in no acute distress. Eyes: Conjunctivae are normal. Head: Atraumatic. Nose: No  congestion/rhinnorhea. Mouth/Throat: Mucous membranes are moist.  Oropharynx non-erythematous. Neck: No stridor.  Cardiovascular: Normal rate, regular rhythm. Grossly normal heart sounds.  Good peripheral circulation. Respiratory: Normal respiratory effort.  No retractions. Lungs question slight crackles in bases Gastrointestinal: Soft and nontender. No distention. No abdominal bruits. No CVA tenderness. Musculoskeletal: No lower extremity tenderness 2+ edema to the knees or above edema.   Neurologic:  Normal speech and language. No gross focal neurologic deficits are appreciated.  Skin:  Skin is warm, dry and intact. No rash noted.   ____________________________________________   LABS (all labs ordered are listed, but only abnormal results are displayed)  Labs Reviewed  BASIC METABOLIC PANEL - Abnormal; Notable for the following components:      Result Value   Potassium 3.4 (*)    Calcium 8.2 (*)    All other components within normal limits  CBC - Abnormal; Notable for the following components:   Hemoglobin 11.8 (*)    RDW 19.7 (*)    Platelets 142 (*)    All other components within normal limits  BRAIN NATRIURETIC PEPTIDE - Abnormal; Notable for the following components:   B Natriuretic Peptide 111.6 (*)    All other components within normal limits  HEPATIC FUNCTION PANEL - Abnormal; Notable for the following components:   Albumin 3.1 (*)    All other components within normal limits  RESPIRATORY PANEL BY RT PCR (FLU A&B, COVID)  TROPONIN I (HIGH SENSITIVITY)  TROPONIN I (HIGH SENSITIVITY)  TROPONIN I (HIGH SENSITIVITY)  TROPONIN I (HIGH SENSITIVITY)   ____________________________________________  EKG  EKG read interpreted by me shows normal sinus rhythm rate of 97 left axis no acute ST-T wave changes ____________________________________________  RADIOLOGY  ED MD interpretation: Chest x-ray read by radiology reviewed by me shows CHF versus scarring  Official  radiology report(s): DG Chest 2 View  Result Date: 01/20/2020 CLINICAL DATA:  Shortness of breath. EXAM: CHEST - 2 VIEW COMPARISON:  Radiograph 11/04/2019.  CT 12/23/2014 FINDINGS: Diffuse peribronchial and interstitial thickening appears similar to prior exam. Upper normal heart size with unchanged mediastinal contours. There is chronic eventration of right hemidiaphragm. Subsegmental opacities at both lung bases. No pleural effusion or pneumothorax. No acute osseous abnormalities are seen. IMPRESSION: 1. Diffuse peribronchial and interstitial thickening, similar to prior exam, likely chronic smoking related lung disease. 2. Subsegmental bibasilar opacities, favoring atelectasis. Electronically Signed   By:  Keith Rake M.D.   On: 01/20/2020 20:39    ____________________________________________   PROCEDURES  Procedure(s) performed (including Critical Care):  Procedures   ____________________________________________   INITIAL IMPRESSION / ASSESSMENT AND PLAN / ED COURSE  Patient with apparently new congestive heart failure.  I have so far not found a record of congestive heart failure before.  She becomes hypoxic with ambulation with O2 sats going into the 80s.  Her BNP is elevated somewhat.  Her troponin is not elevated.  Her protein is not low so it is unlikely that her edema etc. is due to low oncotic pressure.  We will plan on getting her into the hospital for diuresis and evaluation of her apparent new congestive heart failure.            ____________________________________________   FINAL CLINICAL IMPRESSION(S) / ED DIAGNOSES  Final diagnoses:  Congestive heart failure, unspecified HF chronicity, unspecified heart failure type ([place])  Hypoxia     ED Discharge Orders    None      *Please note:  [name] was evaluated in Emergency Department on 01/21/2020 for the symptoms described in the history of present illness. She was evaluated in the context of the  global COVID-19 pandemic, which necessitated consideration that the patient might be at risk for infection with the SARS-CoV-2 virus that causes COVID-19. Institutional protocols and algorithms that pertain to the evaluation of patients at risk for COVID-19 are in a state of rapid change based on information released by regulatory bodies including the CDC and federal and state organizations. These policies and algorithms were followed during the patient's care in the ED.  Some ED evaluations and interventions may be delayed as a result of limited staffing during and the pandemic.*   Note:  This document was prepared using Dragon voice recognition software and may include unintentional dictation errors.    Nena Polio, MD 01/21/20 1539

## 2020-01-21 NOTE — H&P (Addendum)
[name]   PATIENT NAME: [name]    MR#:  [id number]  DATE OF BIRTH:  Jan 01, 1966  DATE OF ADMISSION:  01/21/2020  PRIMARY CARE PHYSICIAN: Ellamae Sia, MD   REQUESTING/REFERRING PHYSICIAN: Vladimir Crofts, MD  CHIEF COMPLAINT:   Chief Complaint  Patient presents with  . Leg Swelling    HISTORY OF PRESENT ILLNESS:  [name]  is a 53 y.o. African-American female with a known history of type 2 diabetes mellitus, hypertension, sleep apnea and hepatitis C, who presented to the emergency room with acute onset of worsening dyspnea and lower extremity edema over the last couple weeks.  She noted hands edema as well.  She has been having orthopnea and paroxysmal nocturnal dyspnea as well as dry cough and occasional wheezing.  Her primary care physician has increased her Lasix recently from 20 to 60 mg p.o. daily.  She denies any history of CHF.  No fever or chills.  No nausea or vomiting.  No chest pain or palpitations.  She has been vaccinated for COVID-19.  No dysuria, oliguria or hematuria or flank pain.  Upon presentation to the emergency room, vital signs were within normal but pulse oximetry was down to 88% on room air with ambulation.  It came up to 100% on 2 L O2 by nasal cannula.  Labs revealed mild hypokalemia of 3.4 otherwise CMP was within normal.  Her BNP was slightly elevated at 111.6.  High-sensitivity troponin I was 7 twice and CBC showed mild anemia better than her previous levels.  Respiratory panel including COVID-19 PCR and influenza antigens came back negative. EKG showed normal sinus rhythm with a rate of 97 with slightly poor R wave progression.  Patient had a chest x-[name] from 9/23 showing diffuse peribronchial interstitial thickening thought to be related to chronic lung disease and subsegmental bibasal opacities favoring atelectasis.  Repeat chest x-[name] today is pending.  Patient was given 40 mg of IV Lasix.  She will be admitted to an observation  telemetry bed for further evaluation and management.   PAST MEDICAL HISTORY:   Past Medical History:  Diagnosis Date  . Bleeding disorder ([place])   . Diabetes mellitus without complication ([place])   . GI bleed   . Hepatitis C, acute   . Hypertension   . O2 dependent   . Sleep apnea   COPD  PAST SURGICAL HISTORY:   Past Surgical History:  Procedure Laterality Date  . ESOPHAGOGASTRODUODENOSCOPY (EGD) WITH PROPOFOL N/A 11/04/2019   Procedure: ESOPHAGOGASTRODUODENOSCOPY (EGD) WITH PROPOFOL;  Surgeon: Lucilla Lame, MD;  Location: [facility] ENDOSCOPY;  Service: Endoscopy;  Laterality: N/A;  . ORIF TIBIA PLATEAU Left 07/02/2015   Procedure: OPEN REDUCTION INTERNAL FIXATION (ORIF) TIBIAL PLATEAU;  Surgeon: Thornton Park, MD;  Location: [facility] ORS;  Service: Orthopedics;  Laterality: Left;    SOCIAL HISTORY:   Social History   Tobacco Use  . Smoking status: Current Every Day Smoker    Packs/day: 0.25    Years: 36.00    Pack years: 9.00    Types: Cigarettes  . Smokeless tobacco: Never Used  Substance Use Topics  . Alcohol use: Yes    Alcohol/week: 0.0 standard drinks    FAMILY HISTORY:  Positive for coronary artery disease, diabetes mellitus and hypertension.  DRUG ALLERGIES:   Allergies  Allergen Reactions  . Other Other (See Comments)    Gel capsules causes GI problems  . Mycophenolate Sodium Other (See Comments)    transaminitis  . Azathioprine  Nausea Only  . Mycophenolate Mofetil Nausea Only  . Tacrolimus Diarrhea and Nausea Only    REVIEW OF SYSTEMS:   ROS As per history of present illness. All pertinent systems were reviewed above. Constitutional, HEENT, cardiovascular, respiratory, GI, GU, musculoskeletal, neuro, psychiatric, endocrine, integumentary and hematologic systems were reviewed and are otherwise negative/unremarkable except for positive findings mentioned above in the HPI.   MEDICATIONS AT HOME:   Prior to Admission medications   Medication Sig Start Date  End Date Taking? Authorizing Provider  ADVAIR DISKUS 250-50 MCG/DOSE AEPB Inhale 1 puff into the lungs 2 (two) times daily. 01/02/20  Yes [provider]  budesonide-formoterol (SYMBICORT) 160-4.5 MCG/ACT inhaler Inhale 2 puffs into the lungs daily.  07/02/19 07/01/20 Yes [provider]  diclofenac Sodium (VOLTAREN) 1 % GEL Apply 1 application topically every 6 (six) hours as needed. 07/06/19  Yes [provider]  ENULOSE 10 GM/15ML SOLN Take 20 g by mouth 2 (two) times daily.  12/20/19  Yes [provider]  escitalopram (LEXAPRO) 10 MG tablet Take 10 mg by mouth daily. 01/06/20  Yes [provider]  fluticasone (FLONASE) 50 MCG/ACT nasal spray Place 2 sprays into both nostrils daily.   Yes [provider]  furosemide (LASIX) 20 MG tablet Take 20 mg by mouth daily.  08/14/16  Yes [provider]  hydrOXYzine (ATARAX/VISTARIL) 10 MG tablet Take 10 mg by mouth 3 (three) times daily as needed. 12/10/19  Yes [provider]  insulin detemir (LEVEMIR) 100 UNIT/ML injection Inject 10-20 Units into the skin 2 (two) times daily. 20 units in morning (may add additional 20 units in evening depending on blood sugar)   Yes [provider]  lidocaine (XYLOCAINE) 5 % ointment APPLY 1 APPLICATION TOPICALLY TWO (2) TIMES A DAY. APPLY 5 GRAMS TO AREA UP TO TWICE DAILY 08/16/16  Yes [provider]  losartan (COZAAR) 100 MG tablet Take 100 mg by mouth daily. 07/27/19  Yes [provider]  nicotine (NICODERM CQ - DOSED IN MG/24 HOURS) 21 mg/24hr patch as directed. 11/15/19  Yes [provider]  omeprazole (PRILOSEC) 20 MG capsule Take 20 mg by mouth 2 (two) times daily. 08/23/19  Yes [provider]  oxyCODONE (OXYCONTIN) 20 mg 12 hr tablet Take 20 mg by mouth every 12 (twelve) hours.   Yes [provider]  SPIRIVA RESPIMAT 2.5 MCG/ACT AERS Inhale 2 puffs into the lungs daily in the afternoon. 07/22/19  Yes  [provider]  triamcinolone ointment (KENALOG) 0.5 % Apply 1 application topically 2 (two) times daily. 08/23/19  Yes [provider]  albuterol (PROVENTIL HFA;VENTOLIN HFA) 108 (90 Base) MCG/ACT inhaler Inhale 1-2 puffs into the lungs every 6 (six) hours as needed for wheezing or shortness of breath.  03/10/13   [provider]  albuterol (PROVENTIL) (2.5 MG/3ML) 0.083% nebulizer solution Take 2.5 mg by nebulization every 4 (four) hours as needed. 01/03/20   [provider]  atorvastatin (LIPITOR) 10 MG tablet Take 10 mg by mouth daily.  04/24/18   [provider]  busPIRone (BUSPAR) 5 MG tablet Take 5 mg by mouth at bedtime. 09/10/19   [provider]  montelukast (SINGULAIR) 10 MG tablet Take 10 mg by mouth at bedtime.  11/19/19 11/18/20  [provider]  naloxone Karma Greaser) 4 MG/0.1ML LIQD nasal spray kit PLEASE SEE ATTACHED FOR DETAILED DIRECTIONS 08/18/18   [provider]  pantoprazole (PROTONIX) 40 MG tablet Take 1 tablet (40 mg total) by mouth  2 (two) times daily. 11/08/19 12/08/19  Harold Hedge, MD      VITAL SIGNS:  Blood pressure (!) 145/78, pulse 82, temperature 98.9 F (37.2 C), temperature source Oral, resp. rate 19, height '5\' 2"'  (1.575 m), weight 101.6 kg, last menstrual period 11/03/2009, SpO2 98 %.  PHYSICAL EXAMINATION:  Physical Exam  GENERAL:  54 y.o.-year-old African-American patient lying in the bed with mild respiratory distress with conversational dyspnea.[name]  EYES: Pupils equal, round, reactive to light and accommodation. No scleral icterus. Extraocular muscles intact.  HEENT: Head atraumatic, normocephalic. Oropharynx and nasopharynx clear.  NECK:  Supple, no jugular venous distention. No thyroid enlargement, no tenderness.  LUNGS: Slightly diminished bibasal breath sounds with minimal bibasilar rales.   CARDIOVASCULAR: Regular rate and rhythm, S1, S2 normal. No murmurs, rubs, or gallops.  ABDOMEN:  Soft, nondistended, nontender. Bowel sounds present. No organomegaly or mass.  EXTREMITIES: 2+ bilateral lower extremity tight pitting edema with no cyanosis, or clubbing.  NEUROLOGIC: Cranial nerves II through XII are intact. Muscle strength 5/5 in all extremities. Sensation intact. Gait not checked.  PSYCHIATRIC: The patient is alert and oriented x 3.  Normal affect and good eye contact. SKIN: No obvious rash, lesion, or ulcer.   LABORATORY PANEL:   CBC Recent Labs  Lab 01/20/20 1948  WBC 8.4  HGB 11.8*  HCT 38.0  PLT 142*   ------------------------------------------------------------------------------------------------------------------  Chemistries  Recent Labs  Lab 01/20/20 1948 01/21/20 1210  NA 138  --   K 3.4*  --   CL 100  --   CO2 28  --   GLUCOSE 96  --   BUN 13  --   CREATININE 0.73  --   CALCIUM 8.2*  --   AST  --  26  ALT  --  29  ALKPHOS  --  61  BILITOT  --  0.8   ------------------------------------------------------------------------------------------------------------------  Cardiac Enzymes No results for input(s): TROPONINI in the last 168 hours. ------------------------------------------------------------------------------------------------------------------  RADIOLOGY:  DG Chest 2 View  Result Date: 01/20/2020 CLINICAL DATA:  Shortness of breath. EXAM: CHEST - 2 VIEW COMPARISON:  Radiograph 11/04/2019.  CT 12/23/2014 FINDINGS: Diffuse peribronchial and interstitial thickening appears similar to prior exam. Upper normal heart size with unchanged mediastinal contours. There is chronic eventration of right hemidiaphragm. Subsegmental opacities at both lung bases. No pleural effusion or pneumothorax. No acute osseous abnormalities are seen. IMPRESSION: 1. Diffuse peribronchial and interstitial thickening, similar to prior exam, likely chronic smoking related lung disease. 2. Subsegmental bibasilar opacities, favoring atelectasis. Electronically Signed    By: Keith Rake M.D.   On: 01/20/2020 20:39      IMPRESSION AND PLAN:   1.  Acute new onset CHF, likely diastolic. -The patient will be admitted to a progressive unit bed. -We will continue diuresis with IV Lasix. -We will follow serial troponin I's. -We will obtain a 2D echo and a cardiology consult in a.m. -I notified Dr. Curt Bears about the patient.  2.   Hypokalemia. -Potassium will be replaced and magnesium level will be checked.  3.  Type 2 diabetes mellitus. -We will place on supplemental coverage with NovoLog and continue her basal coverage.  4.  Dyslipidemia. -We will continue her statin therapy.  5.  Anxiety and depression. -We will continue her BuSpar and Lexapro.  6.  Hypertension. -We will continue her Cozaar.  7.  COPD. -We will continue her Spiriva and hold off her Symbicort. -We will place her on as needed duo nebs.  8.  DVT prophylaxis. -Subcutaneous Lovenox.    All the records are reviewed and case discussed with ED provider. The plan of care was discussed in details with the patient (and family). I answered all questions. The patient agreed to proceed with the above mentioned plan. Further management will depend upon hospital course.   CODE STATUS: Full code  Status is: Observation  The patient remains OBS appropriate and will d/c before 2 midnights.   Dispo: The patient is from: Home              Anticipated d/c is to: Home              Anticipated d/c date is: 1 day              Patient currently is not medically stable to d/c.   TOTAL TIME TAKING CARE OF THIS PATIENT: 55 minutes.    Christel Mormon M.D on 01/21/2020 at 7:59 PM  [facility]   From 7 PM-7 AM, contact night-coverage [email or web address]  CC: Primary care physician; Ellamae Sia, MD

## 2020-01-22 ENCOUNTER — Encounter: Payer: Self-pay | Admitting: Family Medicine

## 2020-01-22 ENCOUNTER — Observation Stay
Admit: 2020-01-22 | Discharge: 2020-01-22 | Disposition: A | Discharge status: Home / Self Care | Payer: Medicaid Other | Attending: Family Medicine | Admitting: Family Medicine

## 2020-01-22 DIAGNOSIS — I110 Hypertensive heart disease with heart failure: Secondary | ICD-10-CM | POA: Diagnosis not present

## 2020-01-22 DIAGNOSIS — G8929 Other chronic pain: Secondary | ICD-10-CM | POA: Diagnosis present

## 2020-01-22 DIAGNOSIS — T502X5A Adverse effect of carbonic-anhydrase inhibitors, benzothiadiazides and other diuretics, initial encounter: Secondary | ICD-10-CM | POA: Diagnosis not present

## 2020-01-22 DIAGNOSIS — D649 Anemia, unspecified: Secondary | ICD-10-CM | POA: Diagnosis present

## 2020-01-22 DIAGNOSIS — I509 Heart failure, unspecified: Secondary | ICD-10-CM | POA: Diagnosis not present

## 2020-01-22 DIAGNOSIS — E119 Type 2 diabetes mellitus without complications: Secondary | ICD-10-CM

## 2020-01-22 DIAGNOSIS — E876 Hypokalemia: Secondary | ICD-10-CM | POA: Diagnosis present

## 2020-01-22 DIAGNOSIS — J849 Interstitial pulmonary disease, unspecified: Secondary | ICD-10-CM

## 2020-01-22 DIAGNOSIS — G4733 Obstructive sleep apnea (adult) (pediatric): Secondary | ICD-10-CM | POA: Diagnosis present

## 2020-01-22 DIAGNOSIS — F17210 Nicotine dependence, cigarettes, uncomplicated: Secondary | ICD-10-CM

## 2020-01-22 DIAGNOSIS — Z888 Allergy status to other drugs, medicaments and biological substances status: Secondary | ICD-10-CM | POA: Diagnosis not present

## 2020-01-22 DIAGNOSIS — K7469 Other cirrhosis of liver: Secondary | ICD-10-CM

## 2020-01-22 DIAGNOSIS — I10 Essential (primary) hypertension: Secondary | ICD-10-CM | POA: Diagnosis not present

## 2020-01-22 DIAGNOSIS — R0902 Hypoxemia: Secondary | ICD-10-CM | POA: Diagnosis not present

## 2020-01-22 DIAGNOSIS — F418 Other specified anxiety disorders: Secondary | ICD-10-CM | POA: Diagnosis present

## 2020-01-22 DIAGNOSIS — I5033 Acute on chronic diastolic (congestive) heart failure: Secondary | ICD-10-CM | POA: Diagnosis present

## 2020-01-22 DIAGNOSIS — E872 Acidosis: Secondary | ICD-10-CM | POA: Diagnosis not present

## 2020-01-22 DIAGNOSIS — J449 Chronic obstructive pulmonary disease, unspecified: Secondary | ICD-10-CM | POA: Diagnosis not present

## 2020-01-22 DIAGNOSIS — Z9981 Dependence on supplemental oxygen: Secondary | ICD-10-CM | POA: Diagnosis not present

## 2020-01-22 DIAGNOSIS — I959 Hypotension, unspecified: Secondary | ICD-10-CM | POA: Diagnosis present

## 2020-01-22 DIAGNOSIS — Z6841 Body Mass Index (BMI) 40.0 and over, adult: Secondary | ICD-10-CM | POA: Diagnosis not present

## 2020-01-22 DIAGNOSIS — Y92239 Unspecified place in hospital as the place of occurrence of the external cause: Secondary | ICD-10-CM | POA: Diagnosis not present

## 2020-01-22 DIAGNOSIS — Z20822 Contact with and (suspected) exposure to covid-19: Secondary | ICD-10-CM | POA: Diagnosis present

## 2020-01-22 DIAGNOSIS — E782 Mixed hyperlipidemia: Secondary | ICD-10-CM | POA: Diagnosis present

## 2020-01-22 DIAGNOSIS — F1020 Alcohol dependence, uncomplicated: Secondary | ICD-10-CM

## 2020-01-22 DIAGNOSIS — J9621 Acute and chronic respiratory failure with hypoxia: Secondary | ICD-10-CM | POA: Diagnosis present

## 2020-01-22 DIAGNOSIS — B182 Chronic viral hepatitis C: Secondary | ICD-10-CM | POA: Diagnosis present

## 2020-01-22 DIAGNOSIS — J9622 Acute and chronic respiratory failure with hypercapnia: Secondary | ICD-10-CM | POA: Diagnosis present

## 2020-01-22 DIAGNOSIS — N179 Acute kidney failure, unspecified: Secondary | ICD-10-CM | POA: Diagnosis not present

## 2020-01-22 DIAGNOSIS — E662 Morbid (severe) obesity with alveolar hypoventilation: Secondary | ICD-10-CM | POA: Diagnosis present

## 2020-01-22 DIAGNOSIS — E785 Hyperlipidemia, unspecified: Secondary | ICD-10-CM | POA: Diagnosis present

## 2020-01-22 DIAGNOSIS — K7460 Unspecified cirrhosis of liver: Secondary | ICD-10-CM | POA: Diagnosis present

## 2020-01-22 DIAGNOSIS — Z23 Encounter for immunization: Secondary | ICD-10-CM | POA: Diagnosis not present

## 2020-01-22 LAB — BASIC METABOLIC PANEL
Anion gap: 6 (ref 5–15)
BUN: 16 mg/dL (ref 6–20)
CO2: 32 mmol/L (ref 22–32)
Calcium: 8.2 mg/dL — ABNORMAL LOW (ref 8.9–10.3)
Chloride: 102 mmol/L (ref 98–111)
Creatinine, Ser: 1.06 mg/dL — ABNORMAL HIGH (ref 0.44–1.00)
GFR calc Af Amer: >60 mL/min (ref >60)
GFR calc non Af Amer: 59 mL/min — ABNORMAL LOW (ref >60)
Glucose, Bld: 150 mg/dL — ABNORMAL HIGH (ref 70–99)
Potassium: 4.2 mmol/L (ref 3.5–5.1)
Sodium: 140 mmol/L (ref 135–145)

## 2020-01-22 LAB — ECHOCARDIOGRAM COMPLETE
Area-P 1/2: 7.9 cm2
Height: 62 in
S' Lateral: 3.15 cm
Weight: 4224 oz

## 2020-01-22 LAB — CBC WITH DIFFERENTIAL/PLATELET
Abs Immature Granulocytes: 0.03 K/uL (ref 0.00–0.07)
Basophils Absolute: 0.1 K/uL (ref 0.0–0.1)
Basophils Relative: 1 %
Eosinophils Absolute: 0.1 K/uL (ref 0.0–0.5)
Eosinophils Relative: 2 %
HCT: 38.7 % (ref 36.0–46.0)
Hemoglobin: 11 g/dL — ABNORMAL LOW (ref 12.0–15.0)
Immature Granulocytes: 0 %
Lymphocytes Relative: 23 %
Lymphs Abs: 1.8 K/uL (ref 0.7–4.0)
MCH: 25.6 pg — ABNORMAL LOW (ref 26.0–34.0)
MCHC: 28.4 g/dL — ABNORMAL LOW (ref 30.0–36.0)
MCV: 90.2 fL (ref 80.0–100.0)
Monocytes Absolute: 1 K/uL (ref 0.1–1.0)
Monocytes Relative: 12 %
Neutro Abs: 5 K/uL (ref 1.7–7.7)
Neutrophils Relative %: 62 %
Platelets: 126 K/uL — ABNORMAL LOW (ref 150–400)
RBC: 4.29 MIL/uL (ref 3.87–5.11)
RDW: 19.5 % — ABNORMAL HIGH (ref 11.5–15.5)
WBC: 8 K/uL (ref 4.0–10.5)
nRBC: 0 % (ref 0.0–0.2)

## 2020-01-22 LAB — GLUCOSE, CAPILLARY
Glucose-Capillary: 121 mg/dL — ABNORMAL HIGH (ref 70–99)
Glucose-Capillary: 136 mg/dL — ABNORMAL HIGH (ref 70–99)
Glucose-Capillary: 92 mg/dL (ref 70–99)
Glucose-Capillary: 100 mg/dL — ABNORMAL HIGH (ref 70–99)

## 2020-01-22 LAB — TROPONIN I (HIGH SENSITIVITY): Troponin I (High Sensitivity): 6 ng/L (ref <18)

## 2020-01-22 LAB — HEMOGLOBIN A1C
Hgb A1c MFr Bld: 6.9 % — ABNORMAL HIGH (ref 4.8–5.6)
Mean Plasma Glucose: 151.33 mg/dL

## 2020-01-22 LAB — MAGNESIUM: Magnesium: 1.9 mg/dL (ref 1.7–2.4)

## 2020-01-22 LAB — HIV ANTIBODY (ROUTINE TESTING W REFLEX): HIV Screen 4th Generation wRfx: NONREACTIVE

## 2020-01-22 MED ORDER — FLUTICASONE FUROATE-VILANTEROL 200-25 MCG/INH IN AEPB
1.0000 | INHALATION_SPRAY | Freq: Every day | RESPIRATORY_TRACT | Status: DC
  Administered 2020-01-22: 1 via RESPIRATORY_TRACT
  Filled 2020-01-22: qty 28

## 2020-01-22 MED ORDER — SALINE SPRAY 0.65 % NA SOLN
1.0000 | NASAL | Status: DC | PRN
  Filled 2020-01-22: qty 44

## 2020-01-22 MED ORDER — SPIRONOLACTONE 25 MG PO TABS
12.5000 mg | ORAL_TABLET | Freq: Every day | ORAL | Status: DC
  Administered 2020-01-22: 12.5 mg via ORAL
  Filled 2020-01-22 (×2): qty 0.5

## 2020-01-22 NOTE — ED Notes (Signed)
Attempt to call report to floor, RN unavailable to take report

## 2020-01-22 NOTE — Progress Notes (Signed)
When I first went to see patient this morning at 0800, she was sound asleep and gasping for air in between snoring. Very irregular breaths but stable oxygen once I woke her up. On 4L [place] but mouth breathing. Pt has a very hard time staying awake while she talks to me, falling asleep mid-sentence.   Later in the morning when giving her meds, she refused Lovenox saying she didn't want to be poked and saying she understands it's for blood clots and states she can't take oral blood thinners. I asked her what she wanted to do then and she replied, "Just give me the Lovenox." I did.  I went to give her Lactulose because she said her last bowel movement was on Wednesday but she said, "I don't want to have a bowel movement today because it hurts my hemmerhoids." I explained to her importance of regularity and getting toxic material out of our body through bowel movements and she still refused. Later, she said she would take it afterall and did.

## 2020-01-22 NOTE — Progress Notes (Signed)
PROGRESS NOTE    Brittany MARIAN   [name]:[id number]  DOB: 18-Apr-1966  PCP: Hyman Hopes, MD    DOA: 01/21/2020 LOS: 0   Brief Narrative   [name]  is a 54 y.o. female with history of of type 2 diabetes mellitus, hypertension, sleep apnea and hepatitis C, morbid obesity who presented to the ED on 01/21/20 with worsening shortness of breath and lower extremity edema and abdominal distention over the last couple weeks.   Reported associated PND, dry cough, wt gain.  PCP had recently increased her Lasix.  In the ED, stable vitals aside from spO2 88% on room air with ambulation, improved on 2L/min oxygen.  Labs notable for K 3.4, BNP 111.6, troponin normal x2, stable and improved mild anemia on CBC.  Respiratory viral panel including COVID-19 PCR and influenza was negative.  ECG normal sinus without acute ischemic changes. Chest x-ray from 9/23 showing diffuse peribronchial interstitial thickening thought to be related to chronic lung disease and subsegmental bibasal opacities favoring atelectasis.    Treated with IV Lasix in the ED and admitted to hospitalist service with cardiology consulted.     Assessment & Plan   Principal Problem:   Acute on chronic diastolic CHF (congestive heart failure) (HCC) Active Problems:   Essential hypertension   Obstructive sleep apnea   Hypokalemia   Hepatitis C, chronic (HCC)   Interstitial lung disease (HCC)   Obesity, Class III, BMI 40-49.9 (morbid obesity) (HCC)   COPD (chronic obstructive pulmonary disease) (HCC)   Dyslipidemia   Depression with anxiety   Acute on chronic diastolic CHF - present on admission with hypoxia, SOB/DOE, edema, PND, mildly elevated BNP all clinically consistent with CHF.  Echo 9/25 -EF 60 to 65%, grade 1 diastolic dysfunction. 9/25 - I/O's nor weight have not been recorded since admission, but continues to have 2-3+ pitting lower extremity edema and clinically volume overloaded. --Cardiology  following --Continue IV Lasix 40 mg twice daily --Strict I/O's and daily weights --Recommend to avoid beta-blockers due to frequent wheezing  Essential hypertension -continue losartan, oral Lasix on hold while getting IV  Obstructive sleep apnea -CPAP ordered  Hypokalemia -K3.4 on admission, replaced.  Monitor and replace as needed.  Hepatitis C, chronic / hepatic cirrhosis /esophageal varices -gets frequent EGDs and manometry.  Admitted here in July with hematemesis and hypovolemic shock requiring vasopressors.  EGD at that time showed grade 3 esophageal varices, banded, also gastric and duodenal ulcers that were not bleeding. --Continue lactulose --On IV Lasix --Appears was not on spironolactone but this is been added by cardiology  Acute on chronic respiratory failure with hypoxia - uses 2 L/min at baseline.  Requiring 4 L/min in the setting of decompensated CHF.  Continue supplemental oxygen as needed to maintain O2 sat greater than 90%.  COPD Interstitial lung disease - chronic respiratory issues appear stable at this time, no wheezing to suggest COPD exacerbation.  Continue as needed albuterol nebs, Singulair, Spiriva.  Breo substituted for home Symbicort.  Dyslipidemia - continue Lipitor  Depression with anxiety -continue BuSpar, Lexapro, Vistaril as needed  Chronic pain - continue home OxyContin, Voltaren gel, lidocaine ointment  Morbid obesity: Body mass index is 48.29 kg/m.  Complicates overall care and prognosis including her respiratory status with sleep apnea and probable obesity hypoventilation.  Patient counseled on lifestyle modifications including weight loss and physical activity and the importance of weight loss for her overall health.    DVT prophylaxis: enoxaparin (LOVENOX) injection 40 mg Start: 01/21/20  2200   Diet:  Diet Orders (From admission, onward)    Start     Ordered   01/21/20 1951  Diet heart healthy/carb modified Room service appropriate? Yes;  Fluid consistency: Thin  Diet effective now       Question Answer Comment  Diet-HS Snack? Nothing   Room service appropriate? Yes   Fluid consistency: Thin      01/21/20 1951            Code Status: Full Code    Subjective 01/22/20    Patient seen at bedside this morning just prior to getting echo.  She appears quite drowsy and nursing report that she has terrible sleep apnea did not have CPAP last night.  Patient reports being tired today.  She denies any recent chest pain, weakness, fatigue more than normal or other recent illnesses.  No acute events reported and she denies any other complaints.   Disposition Plan & Communication   Status is: Inpatient  Remains inpatient appropriate because:IV treatments appropriate due to intensity of illness or inability to take PO   Dispo: The patient is from: Home              Anticipated d/c is to: Home              Anticipated d/c date is: 3 days              Patient currently is not medically stable to d/c.   Family Communication: None at bedside, will attempt to call   Consults, Procedures, Significant Events   Consultants:   Cardiology  Procedures:   Echo 9/25 -EF 60 to 65%, grade 1 diastolic dysfunction, no valvular disease noted  Antimicrobials:  Anti-infectives (From admission, onward)   None         Objective   Vitals:   01/22/20 0400 01/22/20 0757 01/22/20 1132 01/22/20 1510  BP:  (!) 124/95 (!) 115/94 (!) 118/92  Pulse:  (!) 105 (!) 106 (!) 104  Resp:  18 18 19   Temp:  97.6 F (36.4 C) 97.7 F (36.5 C) 97.9 F (36.6 C)  TempSrc:    Oral  SpO2:  91% 95% 94%  Weight: 119.7 kg     Height: 5\' 2"  (1.575 m)       Intake/Output Summary (Last 24 hours) at 01/22/2020 1553 Last data filed at 01/22/2020 1132 Gross per 24 hour  Intake 240 ml  Output 0 ml  Net 240 ml   Filed Weights   01/20/20 1946 01/22/20 0400  Weight: 101.6 kg 119.7 kg    Physical Exam:  General exam: awake but drowsy, alert, no  acute distress, morbidly obese Respiratory system: bilateral crackles, no wheezes, normal respiratory effort. Cardiovascular system: normal S1/S2, RRR, 2-3+ pitting lower extremity edema bilaterally.   Central nervous system: A&O x3. no gross focal neurologic deficits, normal speech Extremities: moves all, normal tone, edema as above Psychiatry: normal mood, congruent affect  Labs   Data Reviewed: I have personally reviewed following labs and imaging studies  CBC: Recent Labs  Lab 01/20/20 1948 01/22/20 0507  WBC 8.4 8.0  NEUTROABS  --  5.0  HGB 11.8* 11.0*  HCT 38.0 38.7  MCV 83.9 90.2  PLT 142* 126*   Basic Metabolic Panel: Recent Labs  Lab 01/20/20 1948 01/22/20 0203 01/22/20 0507  NA 138  --  140  K 3.4*  --  4.2  CL 100  --  102  CO2 28  --  32  GLUCOSE 96  --  150*  BUN 13  --  16  CREATININE 0.73  --  1.06*  CALCIUM 8.2*  --  8.2*  MG  --  1.9  --    GFR: Estimated Creatinine Clearance: 74.7 mL/min (A) (by C-G formula based on SCr of 1.06 mg/dL (H)). Liver Function Tests: Recent Labs  Lab 01/21/20 1210  AST 26  ALT 29  ALKPHOS 61  BILITOT 0.8  PROT 7.6  ALBUMIN 3.1*   No results for input(s): LIPASE, AMYLASE in the last 168 hours. No results for input(s): AMMONIA in the last 168 hours. Coagulation Profile: No results for input(s): INR, PROTIME in the last 168 hours. Cardiac Enzymes: No results for input(s): CKTOTAL, CKMB, CKMBINDEX, TROPONINI in the last 168 hours. BNP (last 3 results) No results for input(s): PROBNP in the last 8760 hours. HbA1C: Recent Labs    01/22/20 0203  HGBA1C 6.9*   CBG: Recent Labs  Lab 01/21/20 2152 01/22/20 0758 01/22/20 1131  GLUCAP 93 121* 136*   Lipid Profile: No results for input(s): CHOL, HDL, LDLCALC, TRIG, CHOLHDL, LDLDIRECT in the last 72 hours. Thyroid Function Tests: No results for input(s): TSH, T4TOTAL, FREET4, T3FREE, THYROIDAB in the last 72 hours. Anemia Panel: No results for input(s):  VITAMINB12, FOLATE, FERRITIN, TIBC, IRON, RETICCTPCT in the last 72 hours. Sepsis Labs: No results for input(s): PROCALCITON, LATICACIDVEN in the last 168 hours.  Recent Results (from the past 240 hour(s))  Respiratory Panel by RT PCR (Flu A&B, Covid) - Nasopharyngeal Swab     Status: None   Collection Time: 01/21/20 12:50 PM   Specimen: Nasopharyngeal Swab  Result Value Ref Range Status   SARS Coronavirus 2 by RT PCR NEGATIVE NEGATIVE Final    Comment: (NOTE) SARS-CoV-2 target nucleic acids are NOT DETECTED.  The SARS-CoV-2 RNA is generally detectable in upper respiratoy specimens during the acute phase of infection. The lowest concentration of SARS-CoV-2 viral copies this assay can detect is 131 copies/mL. A negative result does not preclude SARS-Cov-2 infection and should not be used as the sole basis for treatment or other patient management decisions. A negative result may occur with  improper specimen collection/handling, submission of specimen other than nasopharyngeal swab, presence of viral mutation(s) within the areas targeted by this assay, and inadequate number of viral copies (<131 copies/mL). A negative result must be combined with clinical observations, patient history, and epidemiological information. The expected result is Negative.  Fact Sheet for Patients:  https://www.moore.com/  Fact Sheet for Healthcare Providers:  https://www.young.biz/  This test is no t yet approved or cleared by the [place] FDA and  has been authorized for detection and/or diagnosis of SARS-CoV-2 by FDA under an Emergency Use Authorization (EUA). This EUA will remain  in effect (meaning this test can be used) for the duration of the COVID-19 declaration under Section 564(b)(1) of the Act, 21 U.S.C. section 360bbb-3(b)(1), unless the authorization is terminated or revoked sooner.     Influenza A by PCR NEGATIVE NEGATIVE Final   Influenza B  by PCR NEGATIVE NEGATIVE Final    Comment: (NOTE) The Xpert Xpress SARS-CoV-2/FLU/RSV assay is intended as an aid in  the diagnosis of influenza from Nasopharyngeal swab specimens and  should not be used as a sole basis for treatment. Nasal washings and  aspirates are unacceptable for Xpert Xpress SARS-CoV-2/FLU/RSV  testing.  Fact Sheet for Patients: https://www.moore.com/  Fact Sheet for Healthcare Providers: https://www.young.biz/  This test is not yet approved or cleared  by the [place] FDA and  has been authorized for detection and/or diagnosis of SARS-CoV-2 by  FDA under an Emergency Use Authorization (EUA). This EUA will remain  in effect (meaning this test can be used) for the duration of the  Covid-19 declaration under Section 564(b)(1) of the Act, 21  U.S.C. section 360bbb-3(b)(1), unless the authorization is  terminated or revoked. Performed at [facility], [address] [place], [place] [address]       Imaging Studies   DG Chest 2 View  Result Date: 01/20/2020 CLINICAL DATA:  Shortness of breath. EXAM: CHEST - 2 VIEW COMPARISON:  Radiograph 11/04/2019.  CT 12/23/2014 FINDINGS: Diffuse peribronchial and interstitial thickening appears similar to prior exam. Upper normal heart size with unchanged mediastinal contours. There is chronic eventration of right hemidiaphragm. Subsegmental opacities at both lung bases. No pleural effusion or pneumothorax. No acute osseous abnormalities are seen. IMPRESSION: 1. Diffuse peribronchial and interstitial thickening, similar to prior exam, likely chronic smoking related lung disease. 2. Subsegmental bibasilar opacities, favoring atelectasis. Electronically Signed   By: Narda RutherfordMelanie  Sanford M.D.   On: 01/20/2020 20:39   ECHOCARDIOGRAM COMPLETE  Result Date: 01/22/2020    ECHOCARDIOGRAM REPORT   Patient Name:   Lucianne MussERESA A Walthall Date of Exam: 01/22/2020 Medical Rec #:  [id number]          Height:       62.0 in Accession #:    [id number]        Weight:       264.0 lb Date of Birth:  1966-03-29         BSA:          2.151 m Patient Age:    54 years          BP:           145/78 mmHg Patient Gender: F                 HR:           90 bpm. Exam Location:  [facility] Procedure: 2D Echo Indications:     CHF-ACUTE DIASTOLIC 428.31/I50.31  History:         Patient has no prior history of Echocardiogram examinations.                  Risk Factors:Hypertension, Diabetes and Current Smoker.  Sonographer:     Johnathan HausenSharika Mucker Referring Phys:  [id number] JAN A MANSY Diagnosing Phys: Adrian BlackwaterShaukat Khan MD IMPRESSIONS  1. Left ventricular ejection fraction, by estimation, is 60 to 65%. The left ventricle has normal function. The left ventricle has no regional wall motion abnormalities. Left ventricular diastolic parameters are consistent with Grade I diastolic dysfunction (impaired relaxation).  2. Right ventricular systolic function is normal. The right ventricular size is normal.  3. The mitral valve is normal in structure. No evidence of mitral valve regurgitation. No evidence of mitral stenosis.  4. The aortic valve is normal in structure. Aortic valve regurgitation is not visualized. No aortic stenosis is present.  5. The inferior vena cava is normal in size with greater than 50% respiratory variability, suggesting right atrial pressure of 3 mmHg. FINDINGS  Left Ventricle: Left ventricular ejection fraction, by estimation, is 60 to 65%. The left ventricle has normal function. The left ventricle has no regional wall motion abnormalities. The left ventricular internal cavity size was normal in size. There is  no left ventricular hypertrophy. Left ventricular diastolic parameters are consistent with Grade I diastolic  dysfunction (impaired relaxation). Right Ventricle: The right ventricular size is normal. No increase in right ventricular wall thickness. Right ventricular systolic function is normal. Left Atrium: Left atrial size  was normal in size. Right Atrium: Right atrial size was normal in size. Pericardium: There is no evidence of pericardial effusion. Mitral Valve: The mitral valve is normal in structure. No evidence of mitral valve regurgitation. No evidence of mitral valve stenosis. Tricuspid Valve: The tricuspid valve is normal in structure. Tricuspid valve regurgitation is not demonstrated. No evidence of tricuspid stenosis. Aortic Valve: The aortic valve is normal in structure. Aortic valve regurgitation is not visualized. No aortic stenosis is present. Pulmonic Valve: The pulmonic valve was normal in structure. Pulmonic valve regurgitation is not visualized. No evidence of pulmonic stenosis. Aorta: The aortic root is normal in size and structure. Venous: The inferior vena cava is normal in size with greater than 50% respiratory variability, suggesting right atrial pressure of 3 mmHg. IAS/Shunts: No atrial level shunt detected by color flow Doppler.  LEFT VENTRICLE PLAX 2D LVIDd:         4.80 cm  Diastology LVIDs:         3.15 cm  LV e' medial:    7.83 cm/s LV PW:         1.00 cm  LV E/e' medial:  14.2 LV IVS:        0.62 cm  LV e' lateral:   12.20 cm/s LVOT diam:     2.00 cm  LV E/e' lateral: 9.1 LVOT Area:     3.14 cm  RIGHT VENTRICLE         IVC TAPSE (M-mode): 2.4 cm  IVC diam: 1.96 cm LEFT ATRIUM             Index       RIGHT ATRIUM           Index LA diam:        4.10 cm 1.91 cm/m  RA Area:     15.70 cm LA Vol (A2C):   47.5 ml 22.08 ml/m RA Volume:   40.10 ml  18.64 ml/m LA Vol (A4C):   59.5 ml 27.66 ml/m LA Biplane Vol: 54.8 ml 25.48 ml/m   AORTA Ao Root diam: 3.00 cm MITRAL VALVE                TRICUSPID VALVE MV Area (PHT): 7.90 cm     TR Peak grad:   35.0 mmHg MV Decel Time: 96 msec      TR Vmax:        296.00 cm/s MV E velocity: 111.00 cm/s MV A velocity: 102.00 cm/s  SHUNTS MV E/A ratio:  1.09         Systemic Diam: 2.00 cm Adrian Blackwater MD Electronically signed by Adrian Blackwater MD Signature Date/Time:  01/22/2020/2:53:06 PM    Final      Medications   Scheduled Meds: . atorvastatin  10 mg Oral Daily  . busPIRone  5 mg Oral QHS  . enoxaparin (LOVENOX) injection  40 mg Subcutaneous Q12H  . escitalopram  10 mg Oral Daily  . fluticasone  2 spray Each Nare Daily  . furosemide  40 mg Intravenous Q12H  . insulin aspart  0-9 Units Subcutaneous TID PC & HS  . lactulose  20 g Oral BID  . losartan  100 mg Oral Daily  . montelukast  10 mg Oral QHS  . nicotine  21 mg Transdermal Q0600  . oxyCODONE  20 mg Oral Q12H  . pantoprazole  40 mg Oral BID  . sodium chloride flush  3 mL Intravenous Q12H  . spironolactone  12.5 mg Oral Daily  . tiotropium  1 capsule Inhalation Q1500  . triamcinolone ointment  1 application Topical BID   Continuous Infusions: . sodium chloride         LOS: 0 days    Time spent: 30 minutes with greater than 50% spent in coordination of care and direct patient contact    Pennie Banter, DO [facility]  01/22/2020, 3:53 PM    If 7PM-7AM, please contact night-coverage. How to contact the [facility] Attending or Consulting provider 7A - 7P or covering provider during after hours 7P -7A, for this patient?    1. Check the care team in [facility] and look for a) attending/consulting [facility] provider listed and b) the [facility] team listed 2. Log into [email or web address] and use [place]'s universal password to access. If you do not have the password, please contact the hospital operator. 3. Locate the [facility] provider you are looking for under [facility] and page to a number that you can be directly reached. 4. If you still have difficulty reaching the provider, please page the [facility] (Director on Call) for the Hospitalists listed on amion for assistance.

## 2020-01-22 NOTE — Hospital Course (Addendum)
[name]  is a 54 y.o. female with history of interstitial lung disease, COPD, 2 L/min home oxygen, dCHF, severe OSA, type 2 diabetes mellitus, hypertension, sleep apnea and hepatitis C, morbid obesity who presented to the ED on 01/21/20 with worsening shortness of breath and lower extremity edema and abdominal distention over the last couple weeks.   Reported associated PND, dry cough, wt gain.  PCP had recently increased her Lasix.  In the ED, stable vitals aside from spO2 88% on room air with ambulation, improved on 2L/min oxygen.  Labs notable for K 3.4, BNP 111.6, troponin normal x2, stable and improved mild anemia on CBC.  Respiratory viral panel including COVID-19 PCR and influenza was negative.  ECG normal sinus without acute ischemic changes. Chest x-ray from 9/23 showing diffuse peribronchial interstitial thickening thought to be related to chronic lung disease and subsegmental bibasal opacities favoring atelectasis.    Treated with IV Lasix in the ED and admitted to hospitalist service with cardiology consulted.

## 2020-01-22 NOTE — Consult Note (Addendum)
Cardiology Consult    Patient ID: [name] MRN: [id number], DOB/AGE: 08-20-1965   Admit date: 01/21/2020 Date of Consult: 01/22/2020  Primary Physician: Hyman Hopes, MD Primary Cardiologist: New Requesting Provider: Rito Ehrlich, MD  Patient Profile    [name] is a 54 y.o. female with a history of interstitial lung dzs, tob abuse, COPD, diabetes, asthma, hepatic cirrhosis, hepatitis c, etoh use, htn, hl, esoph varices, osa, and ptsd, who is being seen today for the evaluation of chf at the request of Dr. Denton Lank.  Past Medical History   Past Medical History:  Diagnosis Date  . Asthma   . COPD (chronic obstructive pulmonary disease) (HCC)   . Diabetes mellitus without complication (HCC)   . Diastolic dysfunction    a. 03/2018 [facility]): EF >55%, Gr1 DD. Nl RV fxn.  . Esophageal varices (HCC)    a. 10/2019 EGD: gr III esoph varices s/p banding.  . Essential hypertension   . GI bleed   . HCV (hepatitis C virus)   . Hematemesis    a. 10/2019 EGD: Gr III esoph varices, incompletely eradicated, banded. Non-bleeding ulcers tx w/ argon plasma coag. Single non-bleeding angiodysplastic lesion in duodenum tx w/ APC.  [name] Hepatic cirrhosis (HCC)    a. h/o ETOH/Hep C.  . Hypertension   . Interstitial lung disease (HCC)    a. Followed @ [facility] pulm. Prev on cellcept (caused GI side effects), Imuran (GI se), myfortic (elev LFTs). More recently on rituximab.  . Mixed hyperlipidemia   . Morbid obesity (HCC)   . O2 dependent   . PTSD (post-traumatic stress disorder)   . Sleep apnea   . Tobacco abuse     Past Surgical History:  Procedure Laterality Date  . CHOLECYSTECTOMY    . ESOPHAGOGASTRODUODENOSCOPY (EGD) WITH PROPOFOL N/A 11/04/2019   Procedure: ESOPHAGOGASTRODUODENOSCOPY (EGD) WITH PROPOFOL;  Surgeon: [name], MD;  Location: [facility] ENDOSCOPY;  Service: Endoscopy;  Laterality: N/A;  . ORIF TIBIA PLATEAU Left 07/02/2015   Procedure: OPEN REDUCTION INTERNAL  FIXATION (ORIF) TIBIAL PLATEAU;  Surgeon: Juanell Fairly, MD;  Location: [facility] ORS;  Service: Orthopedics;  Laterality: Left;     Allergies  Allergies  Allergen Reactions  . Other Other (See Comments)    Gel capsules causes GI problems  . Mycophenolate Sodium Other (See Comments)    transaminitis  . Azathioprine Nausea Only  . Mycophenolate Mofetil Nausea Only  . Tacrolimus Diarrhea and Nausea Only    History of Present Illness    54 y/o ? w/ the above complex past medical history including interstitial lung disease on chronic supplemental oxygen, tobacco abuse, diabetes, COPD, asthma, hepatic cirrhosis, esophageal varices, hepatitis C, alcohol use, hypertension, hyperlipidemia, sleep apnea, and PTSD.  Interstitial lung disease history dates back to at least 2017 and she has been followed by [facility] pulmonology.  She was previously on CellCept, Imuran, and Myfortic, all discontinued for GI side effects (Myfortic for elevated LFTs).  Most recently, she was treated with rituximab.   Unfortunately, she continues to smoke 1/4-1/2 a pack a day and also has 2-3 alcohol beverages per day. Echocardiogram in December 2019 performed at [facility] showed an EF greater than 55% with grade 1 diastolic dysfunction and normal RV function.  In the setting of cirrhosis, she has esophageal varices and reports frequent EGDs and manometry.  She frequently experiences hoarseness and cough and upper respiratory infection following EGD.  In July of this year, she was admitted to [facility] with hematemesis and  shock requiring vasopressors.  She was seen by GI and underwent EGD which showed grade 3 esophageal varices which were banded.  Nonbleeding gastric and duodenal ulcers were noted and treated with argon plasma coagulation.  Following that hospitalization, she was seen by pulmonology with plans for PFTs.    She says she has been hoarse with cough and upper respiratory congestion ever since her hospitalization in July and was  recently placed on a prednisone taper within the past 3 to 4 weeks.  Following this, she began experiencing increasing lower extremity and abdominal edema with significant weight gain from a dry weight of 224 up to 260 pounds over the past 3 weeks.  With this, she has been having progressive dyspnea on exertion.  Her Lasix dose was increased from 20 mg daily to 60 mg daily about a week ago and though this improved swelling slightly, she continued to decline with worsening dyspnea and malaise/weakness.  Due to progressive symptoms, she presented to the emergency department on September 23, where she was found to be hypoxic at 88% on room air but this came up to 100% on 2 L, which is her usual home dose.  BNP was slightly elevated at 111.6.  High-sensitivity troponin was normal at 7.  Covid swab was negative.  ECG shows sinus rhythm without acute ST or T changes.  Chest x-ray showed diffuse peribronchial interstitial thickening thought to be related to chronic lung disease and subsegmental bibasilar opacities favoring atelectasis.  She was treated with intravenous Lasix and admitted for further evaluation.  She reports good urine output with improvement in abdominal distention but continues to note lower extremity edema.  I's and O's have not been recorded.  She denies any prior history of chest pain.  She does have chronic orthopnea which has worsened over the past 3 weeks.  Inpatient Medications    . atorvastatin  10 mg Oral Daily  . busPIRone  5 mg Oral QHS  . enoxaparin (LOVENOX) injection  40 mg Subcutaneous Q12H  . escitalopram  10 mg Oral Daily  . fluticasone  2 spray Each Nare Daily  . furosemide  40 mg Intravenous Q12H  . insulin aspart  0-9 Units Subcutaneous TID PC & HS  . lactulose (encephalopathy)  20 g Oral BID  . losartan  100 mg Oral Daily  . montelukast  10 mg Oral QHS  . nicotine  21 mg Transdermal Q0600  . oxyCODONE  20 mg Oral Q12H  . pantoprazole  40 mg Oral BID  . sodium chloride  flush  3 mL Intravenous Q12H  . tiotropium  1 capsule Inhalation Q1500  . triamcinolone ointment  1 application Topical BID    Family History    Family History  Problem Relation Age of Onset  . Heart failure Mother    She indicated that her mother is deceased. She indicated that her father is deceased.   Social History    Social History   Socioeconomic History  . Marital status: Married    Spouse name: Not on file  . Number of children: Not on file  . Years of education: Not on file  . Highest education level: Not on file  Occupational History  . Not on file  Tobacco Use  . Smoking status: Current Every Day Smoker    Packs/day: 0.25    Years: 36.00    Pack years: 9.00    Types: Cigarettes  . Smokeless tobacco: Never Used  Substance and Sexual Activity  .  Alcohol use: Yes    Alcohol/week: 0.0 standard drinks    Comment: 2-3 drinks/wk. Prev heavy drinker.  . Drug use: Not Currently  . Sexual activity: Not on file  Other Topics Concern  . Not on file  Social History Narrative   Lives locally w/ her husband. Sedentary in setting of chronic DOE and O2 dependence.    Social Determinants of Health   Financial Resource Strain:   . Difficulty of Paying Living Expenses: Not on file  Food Insecurity:   . Worried About Programme researcher, broadcasting/film/videounning Out of Food in the Last Year: Not on file  . Ran Out of Food in the Last Year: Not on file  Transportation Needs:   . Lack of Transportation (Medical): Not on file  . Lack of Transportation (Non-Medical): Not on file  Physical Activity:   . Days of Exercise per Week: Not on file  . Minutes of Exercise per Session: Not on file  Stress:   . Feeling of Stress : Not on file  Social Connections:   . Frequency of Communication with Friends and Family: Not on file  . Frequency of Social Gatherings with Friends and Family: Not on file  . Attends Religious Services: Not on file  . Active Member of Clubs or Organizations: Not on file  . Attends Tax inspectorClub or  Organization Meetings: Not on file  . Marital Status: Not on file  Intimate Partner Violence:   . Fear of Current or Ex-Partner: Not on file  . Emotionally Abused: Not on file  . Physically Abused: Not on file  . Sexually Abused: Not on file     Review of Systems    General:  +++ generalized malaise and wt gain. No chills, fever, night sweats. Cardiovascular:  No chest pain, +++ acute on chronic dyspnea on exertion, +++ gut and lower ext edema x 3 wks, +++ acute on chronic orthopnea, no palpitations, paroxysmal nocturnal dyspnea. Dermatological: No rash, lesions/masses Respiratory: +++ nonproductive cough, +++ dyspnea Urologic: No hematuria, dysuria Abdominal:   No nausea, vomiting, +++ diarrhea, no bright red blood per rectum or recent melena, or hematemesis Neurologic:  No visual changes, +++ wkns in setting of malaise, no changes in mental status. All other systems reviewed and are otherwise negative except as noted above.  Physical Exam    Blood pressure (!) 124/95, pulse (!) 105, temperature 97.6 F (36.4 C), resp. rate 18, height 5\' 2"  (1.575 m), weight 119.7 kg, last menstrual period 11/03/2009, SpO2 91 %.  General: Pleasant, NAD Psych: Normal affect. Neuro: Alert and oriented X 3. Moves all extremities spontaneously. HEENT: Normal  Neck: Obese, Supple without bruits.  Difficult to gauge JVP secondary to body habitus. Lungs:  Resp regular and unlabored, diminished breath sounds with bibasilar crackles.  Occasional expiratory wheeze. Heart: RRR, distant, no s3, s4, or murmurs. Abdomen: Obese, soft, non-tender, non-distended, BS + x 4.  Extremities: No clubbing, cyanosis.  2+ bilateral lower extremity edema to the knees with associated superficial tenderness.  DP/PT/Radials 2+ and equal bilaterally.  Labs    Cardiac Enzymes Recent Labs  Lab 01/20/20 1948 01/21/20 1210 01/22/20 0507  TROPONINIHS 7 7 6       Lab Results  Component Value Date   WBC 8.0 01/22/2020    HGB 11.0 (L) 01/22/2020   HCT 38.7 01/22/2020   MCV 90.2 01/22/2020   PLT 126 (L) 01/22/2020    Recent Labs  Lab 01/20/20 1948 01/21/20 1210 01/22/20 0507  NA   < >  --  140  K   < >  --  4.2  CL   < >  --  102  CO2   < >  --  32  BUN   < >  --  16  CREATININE   < >  --  1.06*  CALCIUM   < >  --  8.2*  PROT  --  7.6  --   BILITOT  --  0.8  --   ALKPHOS  --  61  --   ALT  --  29  --   AST  --  26  --   GLUCOSE   < >  --  150*   < > = values in this interval not displayed.    Radiology Studies    DG Chest 2 View  Result Date: 01/20/2020 CLINICAL DATA:  Shortness of breath. EXAM: CHEST - 2 VIEW COMPARISON:  Radiograph 11/04/2019.  CT 12/23/2014 FINDINGS: Diffuse peribronchial and interstitial thickening appears similar to prior exam. Upper normal heart size with unchanged mediastinal contours. There is chronic eventration of right hemidiaphragm. Subsegmental opacities at both lung bases. No pleural effusion or pneumothorax. No acute osseous abnormalities are seen. IMPRESSION: 1. Diffuse peribronchial and interstitial thickening, similar to prior exam, likely chronic smoking related lung disease. 2. Subsegmental bibasilar opacities, favoring atelectasis. Electronically Signed   By: Narda Rutherford M.D.   On: 01/20/2020 20:39    ECG & Cardiac Imaging    RSR, 97, LAD, no acute ST/T changes - personally reviewed.  Sinus rhythm to sinus tachycardia on telemetry-personally reviewed.  Assessment & Plan    1. Acute on chronic diastolic CHF/acute on chronic hypoxic respiratory failure: Patient with a history of interstitial lung disease on chronic home O2 at 2 L/min.  Echo @ [facility] in 2019 w/ EF >55% and Gr1 DD. Nl RV fxn.  She was recently hospitalized in the setting of hematemesis in July with finding of grade 3 esophageal varices status post banding as well as gastric and duodenal ulcer status post argon plasma coagulation.  Following hospitalization, she began experiencing upper  respiratory congestion, cough, and worsening dyspnea and she was placed on a prednisone taper.  Following this, she started noticing increasing abdominal and lower extremity edema with worsening dyspnea prompting escalation of Lasix from 20 mg to 60 mg daily about a week ago.  Her weight had increased from a baseline of 224 about 3 to 4 weeks ago to 260 this past week.  Unfortunately, she continued to have progressive dyspnea and weakness prompting ER evaluation September 23.  BNP was minimally elevated at 111.6.  Troponin is normal.  Chest x-ray without overt edema.  H&H and renal function stable compared to July.  She was volume overloaded on examination placed on Lasix 40 mg IV every 12 hours.  She has noted improvement in abdominal distention though is not at baseline.  She continues to have lower extremity swelling and orthopnea.  I's and O's have not been recorded.  Creatinine and bicarb up slightly with diuresis.  Continue current dose of IV Lasix and follow renal function closely.  Echocardiogram has been ordered.  Continue ARB therapy.  She would likely benefit from addition of spironolactone and I will add.  Heart rates have been 90s to low 100s in the setting of acute illness.  Would avoid beta-blocker given long history and active wheezing.  2.  Interstitial lung disease: Followed at [facility].  Inhaler therapy per internal medicine.  3.  Essential hypertension:  Blood pressure 124/95 this morning.  Continue losartan diuresis.  We will add spironolactone in the setting of  diastolic dysfunction, cirrhosis, and esophageal varices.  4.  History of hematemesis/esophageal varices: Status post admission in July with hematemesis and shock requiring vasopressor therapy.  EGD at that time showed grade 3 esophageal varices as well as gastric and duodenal ulcer status post argon plasma coagulation.  H&H stable on admission.  She does report diarrhea but not melena.  5.  Type 2 diabetes mellitus: Insulin  management per internal medicine.  6.  Tobacco abuse: She continues smoke quarter to a half a pack a day despite interstitial lung disease/COPD/asthma/chronic supplemental oxygen use.  Complete cessation advised.  7.  Hepatic cirrhosis with history of hepatitis C: Previous drank heavily.  Still drinks 2-3 drinks a week.  Cessation advised.  LFTs normal this admission.  Signed, Nicolasa Ducking, NP 01/22/2020, 9:41 AM  Pt seen and examined   I agree with findings as noted above by C Berge Pt is an unfortunate 54 yo with hx of DM Type II, Hep C with cirrhosis, GI bleeding, ILD (followed at [facility]; on immunosuppressive), COPD, EtOH abuse, morbid obesity who presented with increased DOE and LE edema  Increased weakness.  In ED found to be hypoxic    Pt comfortable in bed   Exam difficult due to size Neck:   Difficult to asses JVP    Lungs are rel clear with rales at bases Cardiac exam RRR  No significant murmurs  Distant Abd is obese Ext with 2+ edema legs  I/O incomplete REnal function normal  Recomm:  WOuld continue IV lasix  Add spironolactone   Strict I/O    Echo is pending    Will continue to follow   Dietrich Pates MD   For questions or updates, please contact   Please consult [email or web address] for contact info under Cardiology/STEMI.

## 2020-01-22 NOTE — ED Notes (Signed)
Informed Gregor Hams of bed assignment

## 2020-01-23 LAB — BLOOD GAS, ARTERIAL
Acid-Base Excess: 1 mmol/L (ref 0.0–2.0)
Acid-Base Excess: 0.1 mmol/L (ref 0.0–2.0)
Acid-base deficit: 0.9 mmol/L (ref 0.0–2.0)
Allens test (pass/fail): POSITIVE — AB
Allens test (pass/fail): POSITIVE — AB
Bicarbonate: 30.3 mmol/L — ABNORMAL HIGH (ref 20.0–28.0)
Bicarbonate: 29.9 mmol/L — ABNORMAL HIGH (ref 20.0–28.0)
Bicarbonate: 30.2 mmol/L — ABNORMAL HIGH (ref 20.0–28.0)
Delivery systems: POSITIVE
Delivery systems: POSITIVE
Expiratory PAP: 5
Expiratory PAP: 10
FIO2: 36
FIO2: 50
FIO2: 40
MECHVT: 550 mL
Mechanical Rate: 8
Mechanical Rate: 16
O2 Saturation: 76.5 %
O2 Saturation: 96.2 %
O2 Saturation: 93.9 %
Patient temperature: 37
Patient temperature: 37
Patient temperature: 37
RATE: 8 resp/min
RATE: 16 resp/min
pCO2 arterial: 74 mmHg (ref 32.0–48.0)
pCO2 arterial: 82 mmHg (ref 32.0–48.0)
pCO2 arterial: 81 mmHg (ref 32.0–48.0)
pH, Arterial: 7.22 — ABNORMAL LOW (ref 7.350–7.450)
pH, Arterial: 7.17 — CL (ref 7.350–7.450)
pH, Arterial: 7.18 — CL (ref 7.350–7.450)
pO2, Arterial: 50 mmHg — ABNORMAL LOW (ref 83.0–108.0)
pO2, Arterial: 103 mmHg (ref 83.0–108.0)
pO2, Arterial: 87 mmHg (ref 83.0–108.0)

## 2020-01-23 LAB — MRSA PCR SCREENING: MRSA by PCR: NEGATIVE

## 2020-01-23 LAB — HEPATIC FUNCTION PANEL
ALT: 22 U/L (ref 0–44)
AST: 20 U/L (ref 15–41)
Albumin: 3 g/dL — ABNORMAL LOW (ref 3.5–5.0)
Alkaline Phosphatase: 47 U/L (ref 38–126)
Bilirubin, Direct: 0.2 mg/dL (ref 0.0–0.2)
Indirect Bilirubin: 0.7 mg/dL (ref 0.3–0.9)
Total Bilirubin: 0.9 mg/dL (ref 0.3–1.2)
Total Protein: 7 g/dL (ref 6.5–8.1)

## 2020-01-23 LAB — BASIC METABOLIC PANEL
Anion gap: 8 (ref 5–15)
BUN: 21 mg/dL — ABNORMAL HIGH (ref 6–20)
CO2: 29 mmol/L (ref 22–32)
Calcium: 8.2 mg/dL — ABNORMAL LOW (ref 8.9–10.3)
Chloride: 100 mmol/L (ref 98–111)
Creatinine, Ser: 2.27 mg/dL — ABNORMAL HIGH (ref 0.44–1.00)
GFR calc Af Amer: 27 mL/min — ABNORMAL LOW (ref >60)
GFR calc non Af Amer: 24 mL/min — ABNORMAL LOW (ref >60)
Glucose, Bld: 95 mg/dL (ref 70–99)
Potassium: 4.2 mmol/L (ref 3.5–5.1)
Sodium: 137 mmol/L (ref 135–145)

## 2020-01-23 LAB — GLUCOSE, CAPILLARY
Glucose-Capillary: 126 mg/dL — ABNORMAL HIGH (ref 70–99)
Glucose-Capillary: 115 mg/dL — ABNORMAL HIGH (ref 70–99)
Glucose-Capillary: 94 mg/dL (ref 70–99)

## 2020-01-23 LAB — T4, FREE: Free T4: 0.75 ng/dL (ref 0.61–1.12)

## 2020-01-23 LAB — TSH: TSH: 0.816 u[IU]/mL (ref 0.350–4.500)

## 2020-01-23 LAB — AMMONIA: Ammonia: 49 umol/L — ABNORMAL HIGH (ref 9–35)

## 2020-01-23 MED ORDER — BUDESONIDE 0.25 MG/2ML IN SUSP
0.2500 mg | Freq: Two times a day (BID) | RESPIRATORY_TRACT | Status: DC
  Administered 2020-01-23 – 2020-01-26 (×5): 0.25 mg via RESPIRATORY_TRACT
  Filled 2020-01-23 (×6): qty 2

## 2020-01-23 MED ORDER — SODIUM CHLORIDE 0.9 % IV SOLN
0.0000 ug/min | INTRAVENOUS | Status: DC
  Administered 2020-01-23: 20 ug/min via INTRAVENOUS
  Filled 2020-01-23: qty 10

## 2020-01-23 MED ORDER — ARFORMOTEROL TARTRATE 15 MCG/2ML IN NEBU
15.0000 ug | INHALATION_SOLUTION | Freq: Two times a day (BID) | RESPIRATORY_TRACT | Status: DC
  Administered 2020-01-23 – 2020-01-26 (×5): 15 ug via RESPIRATORY_TRACT
  Filled 2020-01-23 (×7): qty 2

## 2020-01-23 MED ORDER — INFLUENZA VAC SPLIT QUAD 0.5 ML IM SUSY
0.5000 mL | PREFILLED_SYRINGE | INTRAMUSCULAR | Status: AC
  Administered 2020-01-26: 0.5 mL via INTRAMUSCULAR
  Filled 2020-01-23: qty 0.5

## 2020-01-23 MED ORDER — DOPAMINE-DEXTROSE 3.2-5 MG/ML-% IV SOLN
0.0000 ug/kg/min | INTRAVENOUS | Status: DC
  Administered 2020-01-23: 5 ug/kg/min via INTRAVENOUS
  Filled 2020-01-23: qty 250

## 2020-01-23 MED ORDER — METHYLPREDNISOLONE SODIUM SUCC 40 MG IJ SOLR
40.0000 mg | Freq: Two times a day (BID) | INTRAMUSCULAR | Status: DC
  Administered 2020-01-23 – 2020-01-26 (×6): 40 mg via INTRAVENOUS
  Filled 2020-01-23 (×6): qty 1

## 2020-01-23 MED ORDER — CHLORHEXIDINE GLUCONATE CLOTH 2 % EX PADS
6.0000 | MEDICATED_PAD | Freq: Every day | CUTANEOUS | Status: DC
  Administered 2020-01-23 – 2020-01-24 (×2): 6 via TOPICAL

## 2020-01-23 MED ORDER — ALBUMIN HUMAN 25 % IV SOLN
25.0000 g | Freq: Once | INTRAVENOUS | Status: AC
  Administered 2020-01-23: 25 g via INTRAVENOUS
  Filled 2020-01-23: qty 100

## 2020-01-23 MED ORDER — FENTANYL CITRATE (PF) 100 MCG/2ML IJ SOLN
25.0000 ug | INTRAMUSCULAR | Status: DC | PRN
  Administered 2020-01-23: 25 ug via INTRAVENOUS
  Filled 2020-01-23: qty 2

## 2020-01-23 NOTE — Progress Notes (Signed)
Cross Cover  Nurse reports increase lethargy. Refused CPAP during night.  ABGF with resp acidosis, pH7.22 with PCO2 74, Bicarb  At 30, so likely acute on chronic resp failure.  Order for continuous bipap placed.  Patient initially refused and non compliant. Discussed need for BIPAP and alternative treatment is intubation.  Patient agreed to bipap. Repeat ABG in hour.  Due to patient also having hepatic encephalopathy with confusion and impulsivity at times, sitter will be needed for patient safety and compliance with therapy

## 2020-01-23 NOTE — Progress Notes (Signed)
PROGRESS NOTE    [name]   XHB:[id number]  DOB: [birth year]  PCP: Hyman Hopes, MD    DOA: 01/21/2020 LOS: 1   Brief Narrative   [name]  is a 54 y.o. female with history of of type 2 diabetes mellitus, hypertension, sleep apnea and hepatitis C, morbid obesity who presented to the ED on 01/21/20 with worsening shortness of breath and lower extremity edema and abdominal distention over the last couple weeks.   Reported associated PND, dry cough, wt gain.  PCP had recently increased her Lasix.  In the ED, stable vitals aside from spO2 88% on room air with ambulation, improved on 2L/min oxygen.  Labs notable for K 3.4, BNP 111.6, troponin normal x2, stable and improved mild anemia on CBC.  Respiratory viral panel including COVID-19 PCR and influenza was negative.  ECG normal sinus without acute ischemic changes. Chest x-ray from 9/23 showing diffuse peribronchial interstitial thickening thought to be related to chronic lung disease and subsegmental bibasal opacities favoring atelectasis.    Treated with IV Lasix in the ED and admitted to hospitalist service with cardiology consulted.     Assessment & Plan   Principal Problem:   Acute on chronic diastolic CHF (congestive heart failure) (HCC) Active Problems:   Essential hypertension   Obstructive sleep apnea   Hypokalemia   Hepatitis C, chronic (HCC)   Interstitial lung disease (HCC)   Obesity, Class III, BMI 40-49.9 (morbid obesity) (HCC)   COPD (chronic obstructive pulmonary disease) (HCC)   Dyslipidemia   Depression with anxiety   Hypotension - overnight 9/25-26, BP 80's over 50's and low of 75/47.  Likely due to aggressive diuresis and probable third spacing given her liver disease. --hepatic panel pending (no recent albumin level) --stopped Lasix --maintain MAP>=65 (avoid fluids, will need albumin, midodrine if safe for PO, or vasopressors in ICU)  Volume Overoad - present on admission with hypoxia,  SOB/DOE, edema, PND, mildly elevated BNP all clinically consistent with CHF but findings from Echo this admission point to pulmonary etiology vs third-spacing given her liver disease. Echo 9/25 - report EF 60 to 65%, grade 1 diastolic dysfunction.  Further review by Dr. Tenny Craw, diastolic parameters indeterminate but both atria normal in size which goes against significant diastolic dysfunction, no evidence for pulmonary hypertension. 9/25, 9/26 - I/O's nor weight have not been recorded since admission, but continues to have 2-3+ pitting lower extremity edema and clinically volume overloaded. --Cardiology consulted, signed off 9/26, call if needed [facility]) --Was initially treated with IV Lasix 40 mg twice daily --9/26 lasix stopped due to hypotension --Strict I/O's and daily weights (neither getting done, no weights due to lethargy) --Recommend to avoid beta-blockers due to frequent wheezing --continue low sodium diet  Respiratory Acidosis - clinically undetermined if POA Acute on chronic respiratory failure with hypoxia and hypercapnea - uses 2 L/min at baseline.  Requiring 4 L/min in the setting of volume overload.  Hypoxia present on admission (hypercapnia unknown if POA).   Hypercapnia most likely secondary to obesity hypoventilation syndrome.  --on BiPAP continuous --ABG worsened this AM over first initial 90 minutes on bipap    PH 7.22->7.16 / pCO2 74->85 / pO2 50->91 --Transfer to ICU, Dr. Alveta Heimlich notified (currently no beds until other patient transferred out) --Continue supplemental oxygen as needed to maintain O2 sat greater than 90%. --Monitor closely --Repeat ABG this afternoon - pending  Essential hypertension - losartan and Lasix on hold due to hypotension  Obstructive sleep apnea -CPAP  ordered - patient refused last night (9/25-26) and now requiring continuous BiPAP  Hypokalemia -K3.4 on admission, replaced.  Monitor and replace as needed.  Hepatitis C, chronic / hepatic  cirrhosis /esophageal varices / Hepatic encephalopathy -gets frequent EGDs and manometry.  Admitted here in July with hematemesis and hypovolemic shock requiring vasopressors.  EGD at that time showed grade 3 esophageal varices, banded, also gastric and duodenal ulcers that were not bleeding. 9/26 ammonia elevated 49  --Continue lactulose --Goal 2-3 loose stools every day --Currently off diuretics due to hypotension --Appears was not on spironolactone but this is been added by cardiology  COPD Interstitial lung disease - chronic respiratory issues appear stable at this time, no wheezing to suggest COPD exacerbation.  Continue as needed albuterol nebs, Singulair, Spiriva.  Breo substituted for home Symbicort.  Dyslipidemia - continue Lipitor  Depression with anxiety -continue BuSpar, Lexapro, Vistaril as needed  Chronic pain - continue home OxyContin, Voltaren gel, lidocaine ointment  Morbid obesity: Body mass index is 48.62 kg/m.  Complicates overall care and prognosis including her respiratory status with sleep apnea and probable obesity hypoventilation.  Patient counseled on lifestyle modifications including weight loss and physical activity and the importance of weight loss for her overall health.    DVT prophylaxis: enoxaparin (LOVENOX) injection 40 mg Start: 01/21/20 2200   Diet:  Diet Orders (From admission, onward)    Start     Ordered   01/23/20 0546  Diet NPO time specified  Diet effective now        01/23/20 0545            Code Status: Full Code    Subjective 01/23/20    Patient seen at bedside on bipap this morning.  She tries to take mask off.  Nods yes that she is breathing okay.  Still appears a bit lethargic.  ABG worsening.  Informed patient transferring to ICU for closer monitoring and she nodded in agreement.   Disposition Plan & Communication   Status is: Inpatient  Remains inpatient appropriate because:IV treatments appropriate due to intensity of  illness or inability to take PO.  Progressive respiratory failure on continuous BiPAP.   Dispo: The patient is from: Home              Anticipated d/c is to: Home              Anticipated d/c date is: 3 days              Patient currently is not medically stable to d/c.   Family Communication: None at bedside, will attempt to call   Consults, Procedures, Significant Events   Consultants:   Cardiology  Procedures:   Echo 9/25 -EF 60 to 65%, grade 1 diastolic dysfunction, no valvular disease noted  Antimicrobials:  Anti-infectives (From admission, onward)   None         Objective   Vitals:   01/23/20 0515 01/23/20 0550 01/23/20 0600 01/23/20 0740  BP: (!) 91/53   (!) 91/50  Pulse:  98  93  Resp: 20 (!) 30  16  Temp: 99.6 F (37.6 C)   98.4 F (36.9 C)  TempSrc:    Oral  SpO2: 92% 96% 96% 97%  Weight: 120.6 kg     Height:        Intake/Output Summary (Last 24 hours) at 01/23/2020 1357 Last data filed at 01/22/2020 1820 Gross per 24 hour  Intake 360 ml  Output --  Net 360 ml  Filed Weights   01/20/20 1946 01/22/20 0400 01/23/20 0515  Weight: 101.6 kg 119.7 kg 120.6 kg    Physical Exam:  General exam: awake but drowsy, alert, no acute distress, morbidly obese Respiratory system: bilateral crackles, no wheezes, on BiPAP. Cardiovascular system: normal S1/S2, RRR, 2+ pitting lower extremity edema bilaterally.   Central nervous system: A&O x3. no gross focal neurologic deficits, normal speech Extremities: moves all, normal tone, edema as above  Psychiatry: normal mood, congruent affect  Labs   Data Reviewed: I have personally reviewed following labs and imaging studies  CBC: Recent Labs  Lab 01/20/20 1948 01/22/20 0507  WBC 8.4 8.0  NEUTROABS  --  5.0  HGB 11.8* 11.0*  HCT 38.0 38.7  MCV 83.9 90.2  PLT 142* 126*   Basic Metabolic Panel: Recent Labs  Lab 01/20/20 1948 01/22/20 0203 01/22/20 0507 01/23/20 0425  NA 138  --  140 137  K 3.4*   --  4.2 4.2  CL 100  --  102 100  CO2 28  --  32 29  GLUCOSE 96  --  150* 95  BUN 13  --  16 21*  CREATININE 0.73  --  1.06* 2.27*  CALCIUM 8.2*  --  8.2* 8.2*  MG  --  1.9  --   --    GFR: Estimated Creatinine Clearance: 35 mL/min (A) (by C-G formula based on SCr of 2.27 mg/dL (H)). Liver Function Tests: Recent Labs  Lab 01/21/20 1210  AST 26  ALT 29  ALKPHOS 61  BILITOT 0.8  PROT 7.6  ALBUMIN 3.1*   No results for input(s): LIPASE, AMYLASE in the last 168 hours. Recent Labs  Lab 01/23/20 0544  AMMONIA 49*   Coagulation Profile: No results for input(s): INR, PROTIME in the last 168 hours. Cardiac Enzymes: No results for input(s): CKTOTAL, CKMB, CKMBINDEX, TROPONINI in the last 168 hours. BNP (last 3 results) No results for input(s): PROBNP in the last 8760 hours. HbA1C: Recent Labs    01/22/20 0203  HGBA1C 6.9*   CBG: Recent Labs  Lab 01/22/20 1131 01/22/20 1741 01/22/20 2039 01/23/20 0743 01/23/20 1124  GLUCAP 136* 92 100* 126* 115*   Lipid Profile: No results for input(s): CHOL, HDL, LDLCALC, TRIG, CHOLHDL, LDLDIRECT in the last 72 hours. Thyroid Function Tests: No results for input(s): TSH, T4TOTAL, FREET4, T3FREE, THYROIDAB in the last 72 hours. Anemia Panel: No results for input(s): VITAMINB12, FOLATE, FERRITIN, TIBC, IRON, RETICCTPCT in the last 72 hours. Sepsis Labs: No results for input(s): PROCALCITON, LATICACIDVEN in the last 168 hours.  Recent Results (from the past 240 hour(s))  Respiratory Panel by RT PCR (Flu A&B, Covid) - Nasopharyngeal Swab     Status: None   Collection Time: 01/21/20 12:50 PM   Specimen: Nasopharyngeal Swab  Result Value Ref Range Status   SARS Coronavirus 2 by RT PCR NEGATIVE NEGATIVE Final    Comment: (NOTE) SARS-CoV-2 target nucleic acids are NOT DETECTED.  The SARS-CoV-2 RNA is generally detectable in upper respiratoy specimens during the acute phase of infection. The lowest concentration of SARS-CoV-2 viral  copies this assay can detect is 131 copies/mL. A negative result does not preclude SARS-Cov-2 infection and should not be used as the sole basis for treatment or other patient management decisions. A negative result may occur with  improper specimen collection/handling, submission of specimen other than nasopharyngeal swab, presence of viral mutation(s) within the areas targeted by this assay, and inadequate number of viral copies (<131 copies/mL). A  negative result must be combined with clinical observations, patient history, and epidemiological information. The expected result is Negative.  Fact Sheet for Patients:  https://www.moore.com/  Fact Sheet for Healthcare Providers:  https://www.young.biz/  This test is no t yet approved or cleared by the [place] FDA and  has been authorized for detection and/or diagnosis of SARS-CoV-2 by FDA under an Emergency Use Authorization (EUA). This EUA will remain  in effect (meaning this test can be used) for the duration of the COVID-19 declaration under Section 564(b)(1) of the Act, 21 U.S.C. section 360bbb-3(b)(1), unless the authorization is terminated or revoked sooner.     Influenza A by PCR NEGATIVE NEGATIVE Final   Influenza B by PCR NEGATIVE NEGATIVE Final    Comment: (NOTE) The Xpert Xpress SARS-CoV-2/FLU/RSV assay is intended as an aid in  the diagnosis of influenza from Nasopharyngeal swab specimens and  should not be used as a sole basis for treatment. Nasal washings and  aspirates are unacceptable for Xpert Xpress SARS-CoV-2/FLU/RSV  testing.  Fact Sheet for Patients: https://www.moore.com/  Fact Sheet for Healthcare Providers: https://www.young.biz/  This test is not yet approved or cleared by the [place] FDA and  has been authorized for detection and/or diagnosis of SARS-CoV-2 by  FDA under an Emergency Use Authorization (EUA). This  EUA will remain  in effect (meaning this test can be used) for the duration of the  Covid-19 declaration under Section 564(b)(1) of the Act, 21  U.S.C. section 360bbb-3(b)(1), unless the authorization is  terminated or revoked. Performed at [facility], [address] [place], [place] [address]       Imaging Studies   ECHOCARDIOGRAM COMPLETE  Result Date: 01/22/2020    ECHOCARDIOGRAM REPORT   Patient Name:   [name] Date of Exam: 01/22/2020 Medical Rec #:  [id number]         Height:       62.0 in Accession #:    [id number]        Weight:       264.0 lb Date of Birth:  1966-04-02         BSA:          2.151 m Patient Age:    54 years          BP:           145/78 mmHg Patient Gender: F                 HR:           90 bpm. Exam Location:  [facility] Procedure: 2D Echo Indications:     CHF-ACUTE DIASTOLIC 428.31/I50.31  History:         Patient has no prior history of Echocardiogram examinations.                  Risk Factors:Hypertension, Diabetes and Current Smoker.  Sonographer:     Johnathan Hausen Referring Phys:  [id number] HQI A MANSY Diagnosing Phys: Adrian Blackwater MD IMPRESSIONS  1. Left ventricular ejection fraction, by estimation, is 60 to 65%. The left ventricle has normal function. The left ventricle has no regional wall motion abnormalities. Left ventricular diastolic parameters are consistent with Grade I diastolic dysfunction (impaired relaxation).  2. Right ventricular systolic function is normal. The right ventricular size is normal.  3. The mitral valve is normal in structure. No evidence of mitral valve regurgitation. No evidence of mitral stenosis.  4. The aortic valve is normal in structure. Aortic  valve regurgitation is not visualized. No aortic stenosis is present.  5. The inferior vena cava is normal in size with greater than 50% respiratory variability, suggesting right atrial pressure of 3 mmHg. FINDINGS  Left Ventricle: Left ventricular ejection fraction, by  estimation, is 60 to 65%. The left ventricle has normal function. The left ventricle has no regional wall motion abnormalities. The left ventricular internal cavity size was normal in size. There is  no left ventricular hypertrophy. Left ventricular diastolic parameters are consistent with Grade I diastolic dysfunction (impaired relaxation). Right Ventricle: The right ventricular size is normal. No increase in right ventricular wall thickness. Right ventricular systolic function is normal. Left Atrium: Left atrial size was normal in size. Right Atrium: Right atrial size was normal in size. Pericardium: There is no evidence of pericardial effusion. Mitral Valve: The mitral valve is normal in structure. No evidence of mitral valve regurgitation. No evidence of mitral valve stenosis. Tricuspid Valve: The tricuspid valve is normal in structure. Tricuspid valve regurgitation is not demonstrated. No evidence of tricuspid stenosis. Aortic Valve: The aortic valve is normal in structure. Aortic valve regurgitation is not visualized. No aortic stenosis is present. Pulmonic Valve: The pulmonic valve was normal in structure. Pulmonic valve regurgitation is not visualized. No evidence of pulmonic stenosis. Aorta: The aortic root is normal in size and structure. Venous: The inferior vena cava is normal in size with greater than 50% respiratory variability, suggesting right atrial pressure of 3 mmHg. IAS/Shunts: No atrial level shunt detected by color flow Doppler.  LEFT VENTRICLE PLAX 2D LVIDd:         4.80 cm  Diastology LVIDs:         3.15 cm  LV e' medial:    7.83 cm/s LV PW:         1.00 cm  LV E/e' medial:  14.2 LV IVS:        0.62 cm  LV e' lateral:   12.20 cm/s LVOT diam:     2.00 cm  LV E/e' lateral: 9.1 LVOT Area:     3.14 cm  RIGHT VENTRICLE         IVC TAPSE (M-mode): 2.4 cm  IVC diam: 1.96 cm LEFT ATRIUM             Index       RIGHT ATRIUM           Index LA diam:        4.10 cm 1.91 cm/m  RA Area:     15.70 cm LA  Vol (A2C):   47.5 ml 22.08 ml/m RA Volume:   40.10 ml  18.64 ml/m LA Vol (A4C):   59.5 ml 27.66 ml/m LA Biplane Vol: 54.8 ml 25.48 ml/m   AORTA Ao Root diam: 3.00 cm MITRAL VALVE                TRICUSPID VALVE MV Area (PHT): 7.90 cm     TR Peak grad:   35.0 mmHg MV Decel Time: 96 msec      TR Vmax:        296.00 cm/s MV E velocity: 111.00 cm/s MV A velocity: 102.00 cm/s  SHUNTS MV E/A ratio:  1.09         Systemic Diam: 2.00 cm Adrian BlackwaterShaukat Khan MD Electronically signed by Adrian BlackwaterShaukat Khan MD Signature Date/Time: 01/22/2020/2:53:06 PM    Final      Medications   Scheduled Meds: . atorvastatin  10 mg Oral Daily  .  busPIRone  5 mg Oral QHS  . enoxaparin (LOVENOX) injection  40 mg Subcutaneous Q12H  . escitalopram  10 mg Oral Daily  . fluticasone  2 spray Each Nare Daily  . fluticasone furoate-vilanterol  1 puff Inhalation Daily  . insulin aspart  0-9 Units Subcutaneous TID PC & HS  . lactulose  20 g Oral BID  . montelukast  10 mg Oral QHS  . nicotine  21 mg Transdermal Q0600  . oxyCODONE  20 mg Oral Q12H  . pantoprazole  40 mg Oral BID  . sodium chloride flush  3 mL Intravenous Q12H  . spironolactone  12.5 mg Oral Daily  . tiotropium  1 capsule Inhalation Q1500  . triamcinolone ointment  1 application Topical BID   Continuous Infusions: . sodium chloride         LOS: 1 day    Time spent: 45 minutes with greater than 50% spent in coordination of care and direct patient contact    Pennie Banter, DO [facility]  01/23/2020, 1:57 PM    If 7PM-7AM, please contact night-coverage. How to contact the [facility] Attending or Consulting provider 7A - 7P or covering provider during after hours 7P -7A, for this patient?    1. Check the care team in [facility] and look for a) attending/consulting [facility] provider listed and b) the [facility] team listed 2. Log into [email or web address] and use [facility]'s universal password to access. If you do not have the password, please contact the hospital  operator. 3. Locate the [facility] provider you are looking for under [facility] and page to a number that you can be directly reached. 4. If you still have difficulty reaching the provider, please page the [facility] (Director on Call) for the Hospitalists listed on amion for assistance.

## 2020-01-23 NOTE — Plan of Care (Signed)
  Problem: Clinical Measurements: Goal: Ability to maintain clinical measurements within normal limits will improve 01/23/2020 1704 by Titus Mould, RN Outcome: Not Progressing Variance Clinical complication Impact: High Note: ABGs are not improving. Will repeat in 1 hour per providers orders 01/23/2020 1704 by Titus Mould, RN Outcome: Not Progressing 01/23/2020 1702 by Titus Mould, RN Outcome: Not Progressing Goal: Respiratory complications will improve 01/23/2020 1704 by Titus Mould, RN Outcome: Not Progressing Variance Clinical complication Impact: Critical Note: ABGs are not improving with BiPAP. Will repeat in 1 hour per provider orders  01/23/2020 1704 by Titus Mould, RN Outcome: Not Progressing 01/23/2020 1702 by Titus Mould, RN Outcome: Not Progressing

## 2020-01-23 NOTE — Progress Notes (Signed)
This RT was unable to obtain ABG x multiple attempts. MD and RN aware.

## 2020-01-23 NOTE — Progress Notes (Signed)
Patient transfer from PCU on BiPAP with sats 90-92%. Blood pressures low in the 80s. Pressors start, PRN fentanyl started, Foley placed, No real changed with ABG. Additional IVs placed. Spoke to significant other and updated on the transfer. Patient reports pain relief.

## 2020-01-23 NOTE — Progress Notes (Signed)
Progress Note  Patient Name: [name] Date of Encounter: 01/23/2020  [facility] Cardiologist:  New   Subjective   PT on BiPAP   Sleepy    Inpatient Medications    Scheduled Meds: . atorvastatin  10 mg Oral Daily  . busPIRone  5 mg Oral QHS  . enoxaparin (LOVENOX) injection  40 mg Subcutaneous Q12H  . escitalopram  10 mg Oral Daily  . fluticasone  2 spray Each Nare Daily  . fluticasone furoate-vilanterol  1 puff Inhalation Daily  . insulin aspart  0-9 Units Subcutaneous TID PC & HS  . lactulose  20 g Oral BID  . montelukast  10 mg Oral QHS  . nicotine  21 mg Transdermal Q0600  . oxyCODONE  20 mg Oral Q12H  . pantoprazole  40 mg Oral BID  . sodium chloride flush  3 mL Intravenous Q12H  . spironolactone  12.5 mg Oral Daily  . tiotropium  1 capsule Inhalation Q1500  . triamcinolone ointment  1 application Topical BID   Continuous Infusions: . sodium chloride     PRN Meds: sodium chloride, acetaminophen, albuterol, ALPRAZolam, diclofenac Sodium, hydrOXYzine, lidocaine, naloxone, ondansetron (ZOFRAN) IV, sodium chloride, sodium chloride flush, zolpidem   Vital Signs    Vitals:   01/23/20 0515 01/23/20 0550 01/23/20 0600 01/23/20 0740  BP: (!) 91/53   (!) 91/50  Pulse:  98  93  Resp: 20 (!) 30  16  Temp: 99.6 F (37.6 C)   98.4 F (36.9 C)  TempSrc:    Oral  SpO2: 92% 96% 96% 97%  Weight: 120.6 kg     Height:        Intake/Output Summary (Last 24 hours) at 01/23/2020 1110 Last data filed at 01/22/2020 1820 Gross per 24 hour  Intake 360 ml  Output 0 ml  Net 360 ml   Last 3 Weights 01/23/2020 01/22/2020 01/20/2020  Weight (lbs) 265 lb 12.8 oz 264 lb 224 lb  Weight (kg) 120.566 kg 119.75 kg 101.606 kg      Telemetry    SR - Personally Reviewed  ECG    No new  - Personally Reviewed  Physical Exam   GEN: Morbidly obese 54 yo.   Sleepy  On BiPAP Neck: Neck full  Difficult to assess JVP   Cardiac: RRR, no murmurs  Distant HS  Respiratory:  Decreased BS GI: Obese   Supple    MS: 1+ LE  edema; No deformity. Neuro:  Opens eyes some   Sleepy   Labs    High Sensitivity Troponin:   Recent Labs  Lab 01/20/20 1948 01/21/20 1210 01/22/20 0507  TROPONINIHS 7 7 6       Chemistry Recent Labs  Lab 01/20/20 1948 01/21/20 1210 01/22/20 0507 01/23/20 0425  NA 138  --  140 137  K 3.4*  --  4.2 4.2  CL 100  --  102 100  CO2 28  --  32 29  GLUCOSE 96  --  150* 95  BUN 13  --  16 21*  CREATININE 0.73  --  1.06* 2.27*  CALCIUM 8.2*  --  8.2* 8.2*  PROT  --  7.6  --   --   ALBUMIN  --  3.1*  --   --   AST  --  26  --   --   ALT  --  29  --   --   ALKPHOS  --  61  --   --  BILITOT  --  0.8  --   --   GFRNONAA >60  --  59* 24*  GFRAA >60  --  >60 27*  ANIONGAP 10  --  6 8     Hematology Recent Labs  Lab 01/20/20 1948 01/22/20 0507  WBC 8.4 8.0  RBC 4.53 4.29  HGB 11.8* 11.0*  HCT 38.0 38.7  MCV 83.9 90.2  MCH 26.0 25.6*  MCHC 31.1 28.4*  RDW 19.7* 19.5*  PLT 142* 126*    BNP Recent Labs  Lab 01/21/20 1210  BNP 111.6*     DDimer No results for input(s): DDIMER in the last 168 hours.   Radiology    ECHOCARDIOGRAM COMPLETE  Result Date: 01/22/2020    ECHOCARDIOGRAM REPORT   Patient Name:   [name] Date of Exam: 01/22/2020 Medical Rec #:  [id number]         Height:       62.0 in Accession #:    [id number]        Weight:       264.0 lb Date of Birth:  1965/05/10         BSA:          2.151 m Patient Age:    54 years          BP:           145/78 mmHg Patient Gender: F                 HR:           90 bpm. Exam Location:  [facility] Procedure: 2D Echo Indications:     CHF-ACUTE DIASTOLIC 428.31/I50.31  History:         Patient has no prior history of Echocardiogram examinations.                  Risk Factors:Hypertension, Diabetes and Current Smoker.  Sonographer:     Johnathan Hausen Referring Phys:  [id number] HYQ A MANSY Diagnosing Phys: Adrian Blackwater MD IMPRESSIONS  1. Left ventricular ejection fraction, by  estimation, is 60 to 65%. The left ventricle has normal function. The left ventricle has no regional wall motion abnormalities. Left ventricular diastolic parameters are consistent with Grade I diastolic dysfunction (impaired relaxation).  2. Right ventricular systolic function is normal. The right ventricular size is normal.  3. The mitral valve is normal in structure. No evidence of mitral valve regurgitation. No evidence of mitral stenosis.  4. The aortic valve is normal in structure. Aortic valve regurgitation is not visualized. No aortic stenosis is present.  5. The inferior vena cava is normal in size with greater than 50% respiratory variability, suggesting right atrial pressure of 3 mmHg. FINDINGS  Left Ventricle: Left ventricular ejection fraction, by estimation, is 60 to 65%. The left ventricle has normal function. The left ventricle has no regional wall motion abnormalities. The left ventricular internal cavity size was normal in size. There is  no left ventricular hypertrophy. Left ventricular diastolic parameters are consistent with Grade I diastolic dysfunction (impaired relaxation). Right Ventricle: The right ventricular size is normal. No increase in right ventricular wall thickness. Right ventricular systolic function is normal. Left Atrium: Left atrial size was normal in size. Right Atrium: Right atrial size was normal in size. Pericardium: There is no evidence of pericardial effusion. Mitral Valve: The mitral valve is normal in structure. No evidence of mitral valve regurgitation. No evidence of mitral valve stenosis. Tricuspid Valve: The tricuspid valve is  normal in structure. Tricuspid valve regurgitation is not demonstrated. No evidence of tricuspid stenosis. Aortic Valve: The aortic valve is normal in structure. Aortic valve regurgitation is not visualized. No aortic stenosis is present. Pulmonic Valve: The pulmonic valve was normal in structure. Pulmonic valve regurgitation is not visualized.  No evidence of pulmonic stenosis. Aorta: The aortic root is normal in size and structure. Venous: The inferior vena cava is normal in size with greater than 50% respiratory variability, suggesting right atrial pressure of 3 mmHg. IAS/Shunts: No atrial level shunt detected by color flow Doppler.  LEFT VENTRICLE PLAX 2D LVIDd:         4.80 cm  Diastology LVIDs:         3.15 cm  LV e' medial:    7.83 cm/s LV PW:         1.00 cm  LV E/e' medial:  14.2 LV IVS:        0.62 cm  LV e' lateral:   12.20 cm/s LVOT diam:     2.00 cm  LV E/e' lateral: 9.1 LVOT Area:     3.14 cm  RIGHT VENTRICLE         IVC TAPSE (M-mode): 2.4 cm  IVC diam: 1.96 cm LEFT ATRIUM             Index       RIGHT ATRIUM           Index LA diam:        4.10 cm 1.91 cm/m  RA Area:     15.70 cm LA Vol (A2C):   47.5 ml 22.08 ml/m RA Volume:   40.10 ml  18.64 ml/m LA Vol (A4C):   59.5 ml 27.66 ml/m LA Biplane Vol: 54.8 ml 25.48 ml/m   AORTA Ao Root diam: 3.00 cm MITRAL VALVE                TRICUSPID VALVE MV Area (PHT): 7.90 cm     TR Peak grad:   35.0 mmHg MV Decel Time: 96 msec      TR Vmax:        296.00 cm/s MV E velocity: 111.00 cm/s MV A velocity: 102.00 cm/s  SHUNTS MV E/A ratio:  1.09         Systemic Diam: 2.00 cm Adrian BlackwaterShaukat Khan MD Electronically signed by Adrian BlackwaterShaukat Khan MD Signature Date/Time: 01/22/2020/2:53:06 PM    Final     Cardiac Studies   Echo  01/22/20 IMPRESSION 1. Left ventricular ejection fraction, by estimation, is 60 to 65%. The  left ventricle has normal function. The left ventricle has no regional  wall motion abnormalities. Left ventricular diastolic parameters are  consistent with Grade I diastolic  dysfunction (impaired relaxation).  2. Right ventricular systolic function is normal. The right ventricular  size is normal.  3. The mitral valve is normal in structure. No evidence of mitral valve  regurgitation. No evidence of mitral stenosis.  4. The aortic valve is normal in structure. Aortic valve regurgitation  is  not visualized. No aortic stenosis is present.  5. The inferior vena cava is normal in size with greater than 50%  respiratory variability, suggesting right atrial pressure of 3 mmHg.   Patient Profile     Lucianne Musseresa A Mormino is a 54 y.o. female with a history of interstitial lung dzs, tob abuse, COPD, diabetes, asthma, hepatic cirrhosis, hepatitis c, etoh use, htn, hl, esoph varices, osa, and ptsd, who is being seen today for the evaluation  of chf at the request of Dr. Denton Lank.  Assessment & Plan    1 Hypoxia  Pt continues to have difficulties with oxygenation   ABGs from this AM show hypoxia with resp acidosis   ON BiPAP now   Plans to move to ICU  Possible intubation    I have reviewed echo from yesterday the LV and RV are normal size.and show normal function    Diastolic function appears indeterminate  Atria are normal size going against signficant diastolic dysfunction  No evid for significant pulmonary HTN  IVC is upper normal, definitely not severely dilated  (difficult imaging due to patient's size)  All this supports primary pulmonary cause for decompensation. Hypoventilation in setting of ILD     With Lasix yesterday Cr bumped up    Lasix discontinued   I would let autohydrate    Keep on low Na diet    Agree with plans for tx.     2 Pulmonary  Pt resp status continues to be poor  Hx of ILD  On BiPAP now with resp acidosis/ hypoventilation   Plan for tx to ICU   3  Hx HTN  BP actually low this AM    May be due to diuresis   Hold losartan for now   Resume low dose when BP improves  4  HL  Continue lipitor    5  Hx hep C/ cirrhosis   Keep on low Na diet    Pt continues to drink EtOH  6  Hx tob  Needs to quit    REviewed with Dr Denton Lank   Will sign off for now    For questions or updates, please contact [facility] Please consult [email or web address] for contact info under        Signed, Dietrich Pates, MD  01/23/2020, 11:10 AM

## 2020-01-23 NOTE — Progress Notes (Addendum)
   01/23/20 0441  Assess: MEWS Score  Temp 99.6 F (37.6 C)  BP (!) 75/47  Pulse Rate (!) 106  Resp 19  SpO2 99 %  O2 Device Nasal Cannula  Assess: MEWS Score  MEWS Temp 0  MEWS Systolic 2  MEWS Pulse 1  MEWS RR 0  MEWS LOC 0  MEWS Score 3  MEWS Score Color Yellow  Assess: if the MEWS score is Yellow or Red  Were vital signs taken at a resting state? Yes  Focused Assessment Change from prior assessment (see assessment flowsheet)  Early Detection of Sepsis Score *See Row Information* Low  MEWS guidelines implemented *See Row Information* Yes  Treat  MEWS Interventions Escalated (See documentation below)  Pain Scale 0-10  Pain Score 0  Take Vital Signs  Increase Vital Sign Frequency  Yellow: Q 2hr X 2 then Q 4hr X 2, if remains yellow, continue Q 4hrs  Escalate  MEWS: Escalate Yellow: discuss with charge nurse/RN and consider discussing with provider and RRT  Notify: Charge Nurse/RN  Name of Charge Nurse/RN Notified Diannia Ruder  Date Charge Nurse/RN Notified 01/23/20  Time Charge Nurse/RN Notified 0448  Notify: Provider  Provider Name/Title B.Jon Billings  Date Provider Notified 01/23/20  Time Provider Notified 0448  Notification Type Page  Notification Reason Change in status   Patient has been on cpap for most of the night on 5L.  She is lethargic and having difficulty keeping eyes open.   Manuela Schwartz, NP ordered ABG and ammonia levels.  Will continue to monitor.    Addendum: 01/23/2020 5:48 AM  Patient to go on bipap

## 2020-01-24 ENCOUNTER — Inpatient Hospital Stay: Payer: Medicaid Other

## 2020-01-24 DIAGNOSIS — J449 Chronic obstructive pulmonary disease, unspecified: Secondary | ICD-10-CM

## 2020-01-24 LAB — PROTEIN / CREATININE RATIO, URINE
Creatinine, Urine: 161 mg/dL
Protein Creatinine Ratio: 0.12 mg/mg{Cre} (ref 0.00–0.15)
Total Protein, Urine: 20 mg/dL

## 2020-01-24 LAB — GLUCOSE, CAPILLARY
Glucose-Capillary: 107 mg/dL — ABNORMAL HIGH (ref 70–99)
Glucose-Capillary: 118 mg/dL — ABNORMAL HIGH (ref 70–99)
Glucose-Capillary: 120 mg/dL — ABNORMAL HIGH (ref 70–99)
Glucose-Capillary: 139 mg/dL — ABNORMAL HIGH (ref 70–99)
Glucose-Capillary: 95 mg/dL (ref 70–99)

## 2020-01-24 LAB — BASIC METABOLIC PANEL
Anion gap: 9 (ref 5–15)
BUN: 31 mg/dL — ABNORMAL HIGH (ref 6–20)
CO2: 29 mmol/L (ref 22–32)
Calcium: 8.3 mg/dL — ABNORMAL LOW (ref 8.9–10.3)
Chloride: 100 mmol/L (ref 98–111)
Creatinine, Ser: 2.46 mg/dL — ABNORMAL HIGH (ref 0.44–1.00)
GFR calc Af Amer: 25 mL/min — ABNORMAL LOW (ref >60)
GFR calc non Af Amer: 21 mL/min — ABNORMAL LOW (ref >60)
Glucose, Bld: 126 mg/dL — ABNORMAL HIGH (ref 70–99)
Potassium: 4.7 mmol/L (ref 3.5–5.1)
Sodium: 138 mmol/L (ref 135–145)

## 2020-01-24 LAB — MAGNESIUM: Magnesium: 2 mg/dL (ref 1.7–2.4)

## 2020-01-24 LAB — CORTISOL: Cortisol, Plasma: 16 ug/dL

## 2020-01-24 MED ORDER — MIDODRINE HCL 5 MG PO TABS
5.0000 mg | ORAL_TABLET | Freq: Three times a day (TID) | ORAL | Status: DC
  Administered 2020-01-24 – 2020-01-26 (×5): 5 mg via ORAL
  Filled 2020-01-24 (×5): qty 1

## 2020-01-24 NOTE — Progress Notes (Signed)

## 2020-01-24 NOTE — Progress Notes (Signed)
PROGRESS NOTE    SADEEL FIDDLER   ZOX:[id number]  DOB: 1965/08/19  PCP: Hyman Hopes, MD    DOA: 01/21/2020 LOS: 2   Brief Narrative   [name]  is a 54 y.o. female with history of of type 2 diabetes mellitus, hypertension, sleep apnea and hepatitis C, morbid obesity who presented to the ED on 01/21/20 with worsening shortness of breath and lower extremity edema and abdominal distention over the last couple weeks.   Reported associated PND, dry cough, wt gain.  PCP had recently increased her Lasix.  In the ED, stable vitals aside from spO2 88% on room air with ambulation, improved on 2L/min oxygen.  Labs notable for K 3.4, BNP 111.6, troponin normal x2, stable and improved mild anemia on CBC.  Respiratory viral panel including COVID-19 PCR and influenza was negative.  ECG normal sinus without acute ischemic changes. Chest x-ray from 9/23 showing diffuse peribronchial interstitial thickening thought to be related to chronic lung disease and subsegmental bibasal opacities favoring atelectasis.    Treated with IV Lasix in the ED and admitted to hospitalist service with cardiology consulted.     Assessment & Plan   Principal Problem:   Acute on chronic diastolic CHF (congestive heart failure) (HCC) Active Problems:   Essential hypertension   Obstructive sleep apnea   Hypokalemia   Hepatitis C, chronic (HCC)   ILD (interstitial lung disease) (HCC)   Obesity, Class III, BMI 40-49.9 (morbid obesity) (HCC)   COPD (chronic obstructive pulmonary disease) (HCC)   Dyslipidemia   Depression with anxiety   Hypotension - Improved today, was briefly on pressors it appears.  Off this AM, BP 90's over 60's with good MAP on monitor. Overnight 9/25-26, BP 80's over 50's and low of 75/47.  Likely due to aggressive diuresis and ?third spacing given her liver disease. --hepatic panel pending (no recent albumin level) --stopped Lasix --maintain MAP>=65 (avoid fluids, will need albumin,  midodrine if safe for PO, or vasopressors) --PCCM, Nephrology consulted  Acute Kidney Injury - not POA, secondary to diuresis.  Nephrology consulted.  Avoid nephrotoxin and hypotension.  Monitor BMP.  Renal ultrasound and urine prot/creatine ratio are pending.  Volume Overoad - present on admission with hypoxia, SOB/DOE, edema, PND, mildly elevated BNP all clinically consistent with CHF but findings from Echo this admission point to pulmonary etiology vs third-spacing given her liver disease. Echo 9/25 - report EF 60 to 65%, grade 1 diastolic dysfunction.  Further review by Dr. Tenny Craw, diastolic parameters indeterminate but both atria normal in size which goes against significant diastolic dysfunction, no evidence for pulmonary hypertension. 9/25, 9/26 - I/O's nor weight have not been recorded since admission, but continues to have 2-3+ pitting lower extremity edema and clinically volume overloaded. --Cardiology consulted, signed off 9/26, call if needed [facility]) --Was initially treated with IV Lasix 40 mg twice daily --9/26 lasix stopped due to hypotension --Strict I/O's and daily weights (neither getting done, no weights due to lethargy) --Recommend to avoid beta-blockers due to frequent wheezing --continue low sodium diet  Respiratory Acidosis - clinically undetermined if POA Acute on chronic respiratory failure with hypoxia and hypercapnea - uses 2 L/min at baseline.  On presentation needing 4 L/min in the setting of volume overload.  Hypoxia present on admission (hypercapnia unknown if POA).   Hypercapnia most likely secondary to obesity hypoventilation syndrome.  --on BiPAP continuous with no improvement in pCO2 yet --try break from BiPAP when feasible, let pt eat --ABG repeat later today  9/26:  PH 7.22->7.16 / pCO2 74->85 / pO2 50->91 --Transfer to ICU, Dr. Alveta Heimlich notified (currently no beds until other patient transferred out) --Continue supplemental oxygen as needed to maintain O2 sat  greater than 90%. --Monitor closely --PCCM following   Essential hypertension - losartan and Lasix on hold due to hypotension  Obstructive sleep apnea -CPAP ordered - patient refused overnight (9/25-26) and now requiring continuous BiPAP  Hypokalemia -K3.4 on admission, replaced.  Monitor and replace as needed.  Hepatitis C, chronic / hepatic cirrhosis /esophageal varices / Hepatic encephalopathy -gets frequent EGDs and manometry.  Admitted here in July with hematemesis and hypovolemic shock requiring vasopressors.  EGD at that time showed grade 3 esophageal varices, banded, also gastric and duodenal ulcers that were not bleeding. 9/26 ammonia elevated 49  --Continue lactulose --Goal 2-3 loose stools every day --Currently off diuretics due to hypotension --Appears was not on spironolactone but this is been added by cardiology  COPD Interstitial lung disease - chronic respiratory issues appear stable at this time, no wheezing to suggest COPD exacerbation.  Continue as needed albuterol nebs, Singulair, Spiriva.  Breo substituted for home Symbicort.  Dyslipidemia - continue Lipitor  Depression with anxiety -continue BuSpar, Lexapro, Vistaril as needed  Chronic pain - continue home OxyContin, Voltaren gel, lidocaine ointment  Morbid obesity: Body mass index is 48.75 kg/m.  Complicates overall care and prognosis including her respiratory status with sleep apnea and probable obesity hypoventilation.  Patient counseled on lifestyle modifications including weight loss and physical activity and the importance of weight loss for her overall health.    DVT prophylaxis: enoxaparin (LOVENOX) injection 40 mg Start: 01/21/20 2200   Diet:  Diet Orders (From admission, onward)    Start     Ordered   01/23/20 0546  Diet NPO time specified  Diet effective now        01/23/20 0545            Code Status: Full Code    Subjective 01/24/20    Patient seen at bedside in ICU this morning.   She remains on bipap, is more awake for me this morning and able to stay awake in between questions.  She says doing okay.  Very hungry and asking if she can eat something today.   Disposition Plan & Communication   Status is: Inpatient  Remains inpatient appropriate because:IV treatments appropriate due to intensity of illness or inability to take PO.  Respiratory failure on continuous BiPAP.   Dispo: The patient is from: Home              Anticipated d/c is to: Home              Anticipated d/c date is: 3 days              Patient currently is not medically stable to d/c.   Family Communication: None at bedside, will attempt to call   Consults, Procedures, Significant Events   Consultants:   Cardiology  PCCM  Procedures:   Echo 9/25 -EF 60 to 65%, grade 1 diastolic dysfunction, no valvular disease noted  Antimicrobials:  Anti-infectives (From admission, onward)   None         Objective   Vitals:   01/24/20 0155 01/24/20 0346 01/24/20 0500 01/24/20 0605  BP: (!) 88/57 (!) 96/53  (!) 87/60  Pulse: 80 81  82  Resp: (!) 21 (!) 22  20  Temp:    97.7 F (36.5 C)  TempSrc:  Axillary  SpO2: 97% 96%  96%  Weight:   120.9 kg   Height:        Intake/Output Summary (Last 24 hours) at 01/24/2020 0724 Last data filed at 01/24/2020 0200 Gross per 24 hour  Intake 20.69 ml  Output 300 ml  Net -279.31 ml   Filed Weights   01/22/20 0400 01/23/20 0515 01/24/20 0500  Weight: 119.7 kg 120.6 kg 120.9 kg    Physical Exam:  General exam: awake, alert, no acute distress, morbidly obese Respiratory system: diminished due to body habitus, no wheezes, on BiPAP. Cardiovascular system: normal S1/S2, RRR, stable 2+ pitting lower extremity edema bilaterally slightly worse on L.   Central nervous system: A&O x3. no gross focal neurologic deficits, normal speech Extremities: moves all, normal tone, edema as above  Psychiatry: normal mood, congruent affect  Labs   Data  Reviewed: I have personally reviewed following labs and imaging studies  CBC: Recent Labs  Lab 01/20/20 1948 01/22/20 0507  WBC 8.4 8.0  NEUTROABS  --  5.0  HGB 11.8* 11.0*  HCT 38.0 38.7  MCV 83.9 90.2  PLT 142* 126*   Basic Metabolic Panel: Recent Labs  Lab 01/20/20 1948 01/22/20 0203 01/22/20 0507 01/23/20 0425 01/24/20 0322  NA 138  --  140 137 138  K 3.4*  --  4.2 4.2 4.7  CL 100  --  102 100 100  CO2 28  --  32 29 29  GLUCOSE 96  --  150* 95 126*  BUN 13  --  16 21* 31*  CREATININE 0.73  --  1.06* 2.27* 2.46*  CALCIUM 8.2*  --  8.2* 8.2* 8.3*  MG  --  1.9  --   --  2.0   GFR: Estimated Creatinine Clearance: 32.4 mL/min (A) (by C-G formula based on SCr of 2.46 mg/dL (H)). Liver Function Tests: Recent Labs  Lab 01/21/20 1210 01/23/20 1459  AST 26 20  ALT 29 22  ALKPHOS 61 47  BILITOT 0.8 0.9  PROT 7.6 7.0  ALBUMIN 3.1* 3.0*   No results for input(s): LIPASE, AMYLASE in the last 168 hours. Recent Labs  Lab 01/23/20 0544  AMMONIA 49*   Coagulation Profile: No results for input(s): INR, PROTIME in the last 168 hours. Cardiac Enzymes: No results for input(s): CKTOTAL, CKMB, CKMBINDEX, TROPONINI in the last 168 hours. BNP (last 3 results) No results for input(s): PROBNP in the last 8760 hours. HbA1C: Recent Labs    01/22/20 0203  HGBA1C 6.9*   CBG: Recent Labs  Lab 01/23/20 0743 01/23/20 1124 01/23/20 1556 01/24/20 0051 01/24/20 0600  GLUCAP 126* 115* 94 107* 120*   Lipid Profile: No results for input(s): CHOL, HDL, LDLCALC, TRIG, CHOLHDL, LDLDIRECT in the last 72 hours. Thyroid Function Tests: Recent Labs    01/23/20 1459  TSH 0.816  FREET4 0.75   Anemia Panel: No results for input(s): VITAMINB12, FOLATE, FERRITIN, TIBC, IRON, RETICCTPCT in the last 72 hours. Sepsis Labs: No results for input(s): PROCALCITON, LATICACIDVEN in the last 168 hours.  Recent Results (from the past 240 hour(s))  Respiratory Panel by RT PCR (Flu A&B,  Covid) - Nasopharyngeal Swab     Status: None   Collection Time: 01/21/20 12:50 PM   Specimen: Nasopharyngeal Swab  Result Value Ref Range Status   SARS Coronavirus 2 by RT PCR NEGATIVE NEGATIVE Final    Comment: (NOTE) SARS-CoV-2 target nucleic acids are NOT DETECTED.  The SARS-CoV-2 RNA is generally detectable in upper respiratoy specimens  during the acute phase of infection. The lowest concentration of SARS-CoV-2 viral copies this assay can detect is 131 copies/mL. A negative result does not preclude SARS-Cov-2 infection and should not be used as the sole basis for treatment or other patient management decisions. A negative result may occur with  improper specimen collection/handling, submission of specimen other than nasopharyngeal swab, presence of viral mutation(s) within the areas targeted by this assay, and inadequate number of viral copies (<131 copies/mL). A negative result must be combined with clinical observations, patient history, and epidemiological information. The expected result is Negative.  Fact Sheet for Patients:  https://www.moore.com/  Fact Sheet for Healthcare Providers:  https://www.young.biz/  This test is no t yet approved or cleared by the [place] FDA and  has been authorized for detection and/or diagnosis of SARS-CoV-2 by FDA under an Emergency Use Authorization (EUA). This EUA will remain  in effect (meaning this test can be used) for the duration of the COVID-19 declaration under Section 564(b)(1) of the Act, 21 U.S.C. section 360bbb-3(b)(1), unless the authorization is terminated or revoked sooner.     Influenza A by PCR NEGATIVE NEGATIVE Final   Influenza B by PCR NEGATIVE NEGATIVE Final    Comment: (NOTE) The Xpert Xpress SARS-CoV-2/FLU/RSV assay is intended as an aid in  the diagnosis of influenza from Nasopharyngeal swab specimens and  should not be used as a sole basis for treatment. Nasal  washings and  aspirates are unacceptable for Xpert Xpress SARS-CoV-2/FLU/RSV  testing.  Fact Sheet for Patients: https://www.moore.com/  Fact Sheet for Healthcare Providers: https://www.young.biz/  This test is not yet approved or cleared by the [place] FDA and  has been authorized for detection and/or diagnosis of SARS-CoV-2 by  FDA under an Emergency Use Authorization (EUA). This EUA will remain  in effect (meaning this test can be used) for the duration of the  Covid-19 declaration under Section 564(b)(1) of the Act, 21  U.S.C. section 360bbb-3(b)(1), unless the authorization is  terminated or revoked. Performed at [facility], [address] [place], [place] [address]   MRSA PCR Screening     Status: None   Collection Time: 01/23/20  5:01 PM   Specimen: Nasal Mucosa; Nasopharyngeal  Result Value Ref Range Status   MRSA by PCR NEGATIVE NEGATIVE Final    Comment:        The GeneXpert MRSA Assay (FDA approved for NASAL specimens only), is one component of a comprehensive MRSA colonization surveillance program. It is not intended to diagnose MRSA infection nor to guide or monitor treatment for MRSA infections. Performed at [facility], [address] [place], [place] [address]       Imaging Studies   ECHOCARDIOGRAM COMPLETE  Result Date: 01/22/2020    ECHOCARDIOGRAM REPORT   Patient Name:   [name] Date of Exam: 01/22/2020 Medical Rec #:  [id number]         Height:       62.0 in Accession #:    [id number]        Weight:       264.0 lb Date of Birth:  [birth year]         BSA:          2.151 m Patient Age:    54 years          BP:           145/78 mmHg Patient Gender: F  HR:           90 bpm. Exam Location:  [facility] Procedure: 2D Echo Indications:     CHF-ACUTE DIASTOLIC 428.31/I50.31  History:         Patient has no prior history of Echocardiogram examinations.                  Risk  Factors:Hypertension, Diabetes and Current Smoker.  Sonographer:     Johnathan Hausen Referring Phys:  [id number] AVW A MANSY Diagnosing Phys: Adrian Blackwater MD IMPRESSIONS  1. Left ventricular ejection fraction, by estimation, is 60 to 65%. The left ventricle has normal function. The left ventricle has no regional wall motion abnormalities. Left ventricular diastolic parameters are consistent with Grade I diastolic dysfunction (impaired relaxation).  2. Right ventricular systolic function is normal. The right ventricular size is normal.  3. The mitral valve is normal in structure. No evidence of mitral valve regurgitation. No evidence of mitral stenosis.  4. The aortic valve is normal in structure. Aortic valve regurgitation is not visualized. No aortic stenosis is present.  5. The inferior vena cava is normal in size with greater than 50% respiratory variability, suggesting right atrial pressure of 3 mmHg. FINDINGS  Left Ventricle: Left ventricular ejection fraction, by estimation, is 60 to 65%. The left ventricle has normal function. The left ventricle has no regional wall motion abnormalities. The left ventricular internal cavity size was normal in size. There is  no left ventricular hypertrophy. Left ventricular diastolic parameters are consistent with Grade I diastolic dysfunction (impaired relaxation). Right Ventricle: The right ventricular size is normal. No increase in right ventricular wall thickness. Right ventricular systolic function is normal. Left Atrium: Left atrial size was normal in size. Right Atrium: Right atrial size was normal in size. Pericardium: There is no evidence of pericardial effusion. Mitral Valve: The mitral valve is normal in structure. No evidence of mitral valve regurgitation. No evidence of mitral valve stenosis. Tricuspid Valve: The tricuspid valve is normal in structure. Tricuspid valve regurgitation is not demonstrated. No evidence of tricuspid stenosis. Aortic Valve: The aortic valve  is normal in structure. Aortic valve regurgitation is not visualized. No aortic stenosis is present. Pulmonic Valve: The pulmonic valve was normal in structure. Pulmonic valve regurgitation is not visualized. No evidence of pulmonic stenosis. Aorta: The aortic root is normal in size and structure. Venous: The inferior vena cava is normal in size with greater than 50% respiratory variability, suggesting right atrial pressure of 3 mmHg. IAS/Shunts: No atrial level shunt detected by color flow Doppler.  LEFT VENTRICLE PLAX 2D LVIDd:         4.80 cm  Diastology LVIDs:         3.15 cm  LV e' medial:    7.83 cm/s LV PW:         1.00 cm  LV E/e' medial:  14.2 LV IVS:        0.62 cm  LV e' lateral:   12.20 cm/s LVOT diam:     2.00 cm  LV E/e' lateral: 9.1 LVOT Area:     3.14 cm  RIGHT VENTRICLE         IVC TAPSE (M-mode): 2.4 cm  IVC diam: 1.96 cm LEFT ATRIUM             Index       RIGHT ATRIUM           Index LA diam:        4.10 cm  1.91 cm/m  RA Area:     15.70 cm LA Vol (A2C):   47.5 ml 22.08 ml/m RA Volume:   40.10 ml  18.64 ml/m LA Vol (A4C):   59.5 ml 27.66 ml/m LA Biplane Vol: 54.8 ml 25.48 ml/m   AORTA Ao Root diam: 3.00 cm MITRAL VALVE                TRICUSPID VALVE MV Area (PHT): 7.90 cm     TR Peak grad:   35.0 mmHg MV Decel Time: 96 msec      TR Vmax:        296.00 cm/s MV E velocity: 111.00 cm/s MV A velocity: 102.00 cm/s  SHUNTS MV E/A ratio:  1.09         Systemic Diam: 2.00 cm Adrian Blackwater MD Electronically signed by Adrian Blackwater MD Signature Date/Time: 01/22/2020/2:53:06 PM    Final      Medications   Scheduled Meds: . arformoterol  15 mcg Nebulization BID  . atorvastatin  10 mg Oral Daily  . budesonide (PULMICORT) nebulizer solution  0.25 mg Nebulization BID  . busPIRone  5 mg Oral QHS  . Chlorhexidine Gluconate Cloth  6 each Topical Daily  . enoxaparin (LOVENOX) injection  40 mg Subcutaneous Q12H  . escitalopram  10 mg Oral Daily  . fluticasone  2 spray Each Nare Daily  . influenza  vac split quadrivalent PF  0.5 mL Intramuscular Tomorrow-1000  . insulin aspart  0-9 Units Subcutaneous TID PC & HS  . lactulose  20 g Oral BID  . methylPREDNISolone (SOLU-MEDROL) injection  40 mg Intravenous Q12H  . montelukast  10 mg Oral QHS  . nicotine  21 mg Transdermal Q0600  . oxyCODONE  20 mg Oral Q12H  . pantoprazole  40 mg Oral BID  . sodium chloride flush  3 mL Intravenous Q12H  . triamcinolone ointment  1 application Topical BID   Continuous Infusions: . sodium chloride 250 mL (01/23/20 1659)  . DOPamine 5 mcg/kg/min (01/23/20 1837)       LOS: 2 days    Time spent: 30 minutes with greater than 50% spent in coordination of care and direct patient contact    Pennie Banter, DO [facility]  01/24/2020, 7:24 AM    If 7PM-7AM, please contact night-coverage. How to contact the [facility] Attending or Consulting provider 7A - 7P or covering provider during after hours 7P -7A, for this patient?    1. Check the care team in [facility] and look for a) attending/consulting [facility] provider listed and b) the [facility] team listed 2. Log into [email or web address] and use [place]'s universal password to access. If you do not have the password, please contact the hospital operator. 3. Locate the [facility] provider you are looking for under [facility] and page to a number that you can be directly reached. 4. If you still have difficulty reaching the provider, please page the [facility] (Director on Call) for the Hospitalists listed on amion for assistance.

## 2020-01-24 NOTE — Progress Notes (Signed)
Progress Note  Patient Name: [name] Date of Encounter: 01/24/2020  Primary Cardiologist: [facility], Dr. Tenny Craw  Subjective   Somnolent on BiPAP early this AM. Does not open eyes.  Inpatient Medications    Scheduled Meds: . arformoterol  15 mcg Nebulization BID  . atorvastatin  10 mg Oral Daily  . budesonide (PULMICORT) nebulizer solution  0.25 mg Nebulization BID  . busPIRone  5 mg Oral QHS  . Chlorhexidine Gluconate Cloth  6 each Topical Daily  . enoxaparin (LOVENOX) injection  40 mg Subcutaneous Q12H  . escitalopram  10 mg Oral Daily  . fluticasone  2 spray Each Nare Daily  . influenza vac split quadrivalent PF  0.5 mL Intramuscular Tomorrow-1000  . insulin aspart  0-9 Units Subcutaneous TID PC & HS  . lactulose  20 g Oral BID  . methylPREDNISolone (SOLU-MEDROL) injection  40 mg Intravenous Q12H  . montelukast  10 mg Oral QHS  . nicotine  21 mg Transdermal Q0600  . oxyCODONE  20 mg Oral Q12H  . pantoprazole  40 mg Oral BID  . sodium chloride flush  3 mL Intravenous Q12H  . triamcinolone ointment  1 application Topical BID   Continuous Infusions: . sodium chloride 250 mL (01/23/20 1659)  . DOPamine 5 mcg/kg/min (01/23/20 1837)   PRN Meds: sodium chloride, acetaminophen, albuterol, ALPRAZolam, diclofenac Sodium, fentaNYL (SUBLIMAZE) injection, hydrOXYzine, lidocaine, naloxone, ondansetron (ZOFRAN) IV, sodium chloride, sodium chloride flush   Vital Signs    Vitals:   01/24/20 0155 01/24/20 0346 01/24/20 0500 01/24/20 0605  BP: (!) 88/57 (!) 96/53  (!) 87/60  Pulse: 80 81  82  Resp: (!) 21 (!) 22  20  Temp:    97.7 F (36.5 C)  TempSrc:    Axillary  SpO2: 97% 96%  96%  Weight:   120.9 kg   Height:        Intake/Output Summary (Last 24 hours) at 01/24/2020 0729 Last data filed at 01/24/2020 0200 Gross per 24 hour  Intake 20.69 ml  Output 300 ml  Net -279.31 ml   Last 3 Weights 01/24/2020 01/23/2020 01/22/2020  Weight (lbs) 266 lb 8.6 oz 265 lb 12.8 oz  264 lb  Weight (kg) 120.9 kg 120.566 kg 119.75 kg      Telemetry    NSR, 70-90s with rare PAC - Personally Reviewed  ECG    No new tracings - Personally Reviewed  Physical Exam   GEN: No acute distress.  On BiPAP. Somnolent. Neck: Difficult to assess JVD as on BiPAP and 2/2 body habitus. Cardiac: RRR, no murmurs, rubs, or gallops.  Respiratory: Anterior auscultation only, coarse breath sounds on BiPAP. GI: Obese, nontender, non-distended  MS: Moderate to 1+ bilateral edema; No deformity. Neuro:  Nonfocal  Psych: Normal affect   Labs    High Sensitivity Troponin:   Recent Labs  Lab 01/20/20 1948 01/21/20 1210 01/22/20 0507  TROPONINIHS 7 7 6       Chemistry Recent Labs  Lab 01/20/20 1948 01/21/20 1210 01/22/20 0507 01/23/20 0425 01/23/20 1459 01/24/20 0322  NA   < >  --  140 137  --  138  K   < >  --  4.2 4.2  --  4.7  CL   < >  --  102 100  --  100  CO2   < >  --  32 29  --  29  GLUCOSE   < >  --  150* 95  --  126*  BUN   < >  --  16 21*  --  31*  CREATININE   < >  --  1.06* 2.27*  --  2.46*  CALCIUM   < >  --  8.2* 8.2*  --  8.3*  PROT  --  7.6  --   --  7.0  --   ALBUMIN  --  3.1*  --   --  3.0*  --   AST  --  26  --   --  20  --   ALT  --  29  --   --  22  --   ALKPHOS  --  61  --   --  47  --   BILITOT  --  0.8  --   --  0.9  --   GFRNONAA   < >  --  59* 24*  --  21*  GFRAA   < >  --  >60 27*  --  25*  ANIONGAP   < >  --  6 8  --  9   < > = values in this interval not displayed.     Hematology Recent Labs  Lab 01/20/20 1948 01/22/20 0507  WBC 8.4 8.0  RBC 4.53 4.29  HGB 11.8* 11.0*  HCT 38.0 38.7  MCV 83.9 90.2  MCH 26.0 25.6*  MCHC 31.1 28.4*  RDW 19.7* 19.5*  PLT 142* 126*    BNP Recent Labs  Lab 01/21/20 1210  BNP 111.6*     DDimer No results for input(s): DDIMER in the last 168 hours.   Radiology    ECHOCARDIOGRAM COMPLETE  Result Date: 01/22/2020    ECHOCARDIOGRAM REPORT   Patient Name:   [name] Date of  Exam: 01/22/2020 Medical Rec #:  [id number]         Height:       62.0 in Accession #:    [id number]        Weight:       264.0 lb Date of Birth:  08-29-1965         BSA:          2.151 m Patient Age:    54 years          BP:           145/78 mmHg Patient Gender: F                 HR:           90 bpm. Exam Location:  [facility] Procedure: 2D Echo Indications:     CHF-ACUTE DIASTOLIC 428.31/I50.31  History:         Patient has no prior history of Echocardiogram examinations.                  Risk Factors:Hypertension, Diabetes and Current Smoker.  Sonographer:     Johnathan Hausen Referring Phys:  [id number] HUT A MANSY Diagnosing Phys: Adrian Blackwater MD IMPRESSIONS  1. Left ventricular ejection fraction, by estimation, is 60 to 65%. The left ventricle has normal function. The left ventricle has no regional wall motion abnormalities. Left ventricular diastolic parameters are consistent with Grade I diastolic dysfunction (impaired relaxation).  2. Right ventricular systolic function is normal. The right ventricular size is normal.  3. The mitral valve is normal in structure. No evidence of mitral valve regurgitation. No evidence of mitral stenosis.  4. The aortic valve is normal in structure. Aortic valve regurgitation is  not visualized. No aortic stenosis is present.  5. The inferior vena cava is normal in size with greater than 50% respiratory variability, suggesting right atrial pressure of 3 mmHg. FINDINGS  Left Ventricle: Left ventricular ejection fraction, by estimation, is 60 to 65%. The left ventricle has normal function. The left ventricle has no regional wall motion abnormalities. The left ventricular internal cavity size was normal in size. There is  no left ventricular hypertrophy. Left ventricular diastolic parameters are consistent with Grade I diastolic dysfunction (impaired relaxation). Right Ventricle: The right ventricular size is normal. No increase in right ventricular wall thickness. Right ventricular systolic  function is normal. Left Atrium: Left atrial size was normal in size. Right Atrium: Right atrial size was normal in size. Pericardium: There is no evidence of pericardial effusion. Mitral Valve: The mitral valve is normal in structure. No evidence of mitral valve regurgitation. No evidence of mitral valve stenosis. Tricuspid Valve: The tricuspid valve is normal in structure. Tricuspid valve regurgitation is not demonstrated. No evidence of tricuspid stenosis. Aortic Valve: The aortic valve is normal in structure. Aortic valve regurgitation is not visualized. No aortic stenosis is present. Pulmonic Valve: The pulmonic valve was normal in structure. Pulmonic valve regurgitation is not visualized. No evidence of pulmonic stenosis. Aorta: The aortic root is normal in size and structure. Venous: The inferior vena cava is normal in size with greater than 50% respiratory variability, suggesting right atrial pressure of 3 mmHg. IAS/Shunts: No atrial level shunt detected by color flow Doppler.  LEFT VENTRICLE PLAX 2D LVIDd:         4.80 cm  Diastology LVIDs:         3.15 cm  LV e' medial:    7.83 cm/s LV PW:         1.00 cm  LV E/e' medial:  14.2 LV IVS:        0.62 cm  LV e' lateral:   12.20 cm/s LVOT diam:     2.00 cm  LV E/e' lateral: 9.1 LVOT Area:     3.14 cm  RIGHT VENTRICLE         IVC TAPSE (M-mode): 2.4 cm  IVC diam: 1.96 cm LEFT ATRIUM             Index       RIGHT ATRIUM           Index LA diam:        4.10 cm 1.91 cm/m  RA Area:     15.70 cm LA Vol (A2C):   47.5 ml 22.08 ml/m RA Volume:   40.10 ml  18.64 ml/m LA Vol (A4C):   59.5 ml 27.66 ml/m LA Biplane Vol: 54.8 ml 25.48 ml/m   AORTA Ao Root diam: 3.00 cm MITRAL VALVE                TRICUSPID VALVE MV Area (PHT): 7.90 cm     TR Peak grad:   35.0 mmHg MV Decel Time: 96 msec      TR Vmax:        296.00 cm/s MV E velocity: 111.00 cm/s MV A velocity: 102.00 cm/s  SHUNTS MV E/A ratio:  1.09         Systemic Diam: 2.00 cm Adrian BlackwaterShaukat Khan MD Electronically signed  by Adrian BlackwaterShaukat Khan MD Signature Date/Time: 01/22/2020/2:53:06 PM    Final     Cardiac Studies   Echo 01/22/20 1. Left ventricular ejection fraction, by estimation, is 60 to 65%. The  left ventricle has normal function. The left ventricle has no regional  wall motion abnormalities. Left ventricular diastolic parameters are  consistent with Grade I diastolic  dysfunction (impaired relaxation).  2. Right ventricular systolic function is normal. The right ventricular  size is normal.  3. The mitral valve is normal in structure. No evidence of mitral valve  regurgitation. No evidence of mitral stenosis.  4. The aortic valve is normal in structure. Aortic valve regurgitation is  not visualized. No aortic stenosis is present.  5. The inferior vena cava is normal in size with greater than 50%  respiratory variability, suggesting right atrial pressure of 3 mmHg.  Patient Profile     54 y.o. female with a history of interstitial lung disease, tobacco abuse, COPD, diabetes, asthma, hepatic cirrhosis, hepatitis c, alcohol use, hypertension, hyperlipidemia, esophageal varices, OSA, PTSD, and who is being seen today for reevaluation and management of acute on chronic diastolic congestive heart failure.  Assessment & Plan    Acute on chronic diastolic heart failure Acute on chronic hypoxic respiratory failure --H/o interstitial lung disease.  Respiratory status continues to be poor.  Moved to the ICU.  Critical care has been consulted and continued on noninvasive ventilation support as ruled high risk for intubation.  As previously noted, echo was reviewed and points towards primary pulmonary cause for decompensation. Echo above shows G1DD. She received Lasix over the weekend with subsequent bump in creatinine and Lasix discontinued. Cr continues to be high as of today (9/27). Continue to monitor I/O, daily weights.   Essential hypertension --Losartan on hold after low blood pressure s/p diuresis  and bump in creatinine. Subsequent bump in Cr today with ongoing low pressures. Would continue to hold Losartan. Continue current pressors.   AKI --Caution with nephrotoxins. Daily BMET.   HLD --Continue Lipitor 10mg  daily.  DM2 --SSI, per IM/critical care team.  Tobacco abuse --Cessation advised. Continue nicotine patch.   History of hematemesis, esophageal varices Hepatic cirrhosis with history of hepatitis C --Alcohol cessation advised.   For questions or updates, please contact [facility] Please consult [email or web address] for contact info under        Signed, , PA-C  01/24/2020, 7:29 AM

## 2020-01-24 NOTE — Consult Note (Signed)
[address] [place], [place] [address] Phone [phone]. Fax [phone]  Date: 01/24/2020                  Patient Name:  [name]  MRN: [id number]  DOB: 1966-02-09  Age / Sex: 54 y.o., female         PCP: Ellamae Sia, MD                 Service Requesting Consult: IM/ Ezekiel Slocumb, DO                 Reason for Consult: ARF            History of Present Illness: Patient is a 54 y.o. female  admitted to [facility] on 01/21/2020  Nephrology consult has been requested for acute renal failure and assistance with diuresis.  Patient has had low blood pressure.  She is also requiring continuous BiPAP and pressors.  Urine output did not improve with Lasix.  Patient has multiple baseline health problems including morbid obesity, chronic respiratory failure requiring home oxygen, interstitial lung disease, COPD. According to notes, she has been treated with CellCept, Imuran and Myfortic in the past  Review of labs show increasing creatinine trend as summarized below Lab Results  Component Value Date   CREATININE 2.46 (H) 01/24/2020   CREATININE 2.27 (H) 01/23/2020   CREATININE 1.06 (H) 01/22/2020   Urine output reported at 1000 cc today so far   Medications: Outpatient medications: Medications Prior to Admission  Medication Sig Dispense Refill Last Dose  . ADVAIR DISKUS 250-50 MCG/DOSE AEPB Inhale 1 puff into the lungs 2 (two) times daily.   01/20/2020 at 0800  . budesonide-formoterol (SYMBICORT) 160-4.5 MCG/ACT inhaler Inhale 2 puffs into the lungs daily.    01/20/2020 at 0800  . diclofenac Sodium (VOLTAREN) 1 % GEL Apply 1 application topically every 6 (six) hours as needed.   01/20/2020 at 1300  . ENULOSE 10 GM/15ML SOLN Take 20 g by mouth 2 (two) times daily.    01/20/2020 at Unknown time  . escitalopram (LEXAPRO) 10 MG tablet Take 10 mg by mouth daily.   01/20/2020 at 0800  . fluticasone (FLONASE) 50 MCG/ACT nasal spray Place 2 sprays into both  nostrils daily.   01/20/2020 at 0800  . furosemide (LASIX) 20 MG tablet Take 20 mg by mouth daily.   2 01/20/2020 at 0800  . hydrOXYzine (ATARAX/VISTARIL) 10 MG tablet Take 10 mg by mouth 3 (three) times daily as needed.   Past Week at prn  . insulin detemir (LEVEMIR) 100 UNIT/ML injection Inject 10-20 Units into the skin 2 (two) times daily. 20 units in morning (may add additional 20 units in evening depending on blood sugar)   01/20/2020 at 0800  . lidocaine (XYLOCAINE) 5 % ointment APPLY 1 APPLICATION TOPICALLY TWO (2) TIMES A DAY. APPLY 5 GRAMS TO AREA UP TO TWICE DAILY  3 01/20/2020 at Unknown time  . losartan (COZAAR) 100 MG tablet Take 100 mg by mouth daily.   01/20/2020 at 0800  . nicotine (NICODERM CQ - DOSED IN MG/24 HOURS) 21 mg/24hr patch as directed.   01/20/2020 at Unknown time  . omeprazole (PRILOSEC) 20 MG capsule Take 20 mg by mouth 2 (two) times daily.   01/20/2020 at 0800  . oxyCODONE (OXYCONTIN) 20 mg 12 hr tablet Take 20 mg by mouth every 12 (twelve) hours.   01/20/2020 at 1300  . SPIRIVA RESPIMAT 2.5 MCG/ACT AERS Inhale 2 puffs  into the lungs daily in the afternoon.   01/20/2020 at 1300  . triamcinolone ointment (KENALOG) 0.5 % Apply 1 application topically 2 (two) times daily.   01/20/2020 at Unknown time  . albuterol (PROVENTIL HFA;VENTOLIN HFA) 108 (90 Base) MCG/ACT inhaler Inhale 1-2 puffs into the lungs every 6 (six) hours as needed for wheezing or shortness of breath.    unknown at prn  . albuterol (PROVENTIL) (2.5 MG/3ML) 0.083% nebulizer solution Take 2.5 mg by nebulization every 4 (four) hours as needed.   unknown at prn  . atorvastatin (LIPITOR) 10 MG tablet Take 10 mg by mouth daily.    01/19/2020 at 2000  . busPIRone (BUSPAR) 5 MG tablet Take 5 mg by mouth at bedtime.   01/19/2020 at 2000  . montelukast (SINGULAIR) 10 MG tablet Take 10 mg by mouth at bedtime.    01/19/2020 at 2000  . naloxone [facility]) 4 MG/0.1ML LIQD nasal spray kit PLEASE SEE ATTACHED FOR DETAILED DIRECTIONS    unknown at prn  . pantoprazole (PROTONIX) 40 MG tablet Take 1 tablet (40 mg total) by mouth 2 (two) times daily. 30 tablet 1     Current medications: Current Facility-Administered Medications  Medication Dose Route Frequency Provider Last Rate Last Admin  . 0.9 %  sodium chloride infusion  250 mL Intravenous PRN Mansy, Jan A, MD 10 mL/hr at 01/23/20 1659 250 mL at 01/23/20 1659  . acetaminophen (TYLENOL) tablet 650 mg  650 mg Oral Q4H PRN Mansy, Jan A, MD      . albuterol (PROVENTIL) (2.5 MG/3ML) 0.083% nebulizer solution 2.5 mg  2.5 mg Nebulization Q4H PRN Mansy, Jan A, MD      . ALPRAZolam Duanne Moron) tablet 0.25 mg  0.25 mg Oral BID PRN Mansy, Jan A, MD      . arformoterol (BROVANA) nebulizer solution 15 mcg  15 mcg Nebulization BID Tyler Pita, MD   15 mcg at 01/24/20 0847  . atorvastatin (LIPITOR) tablet 10 mg  10 mg Oral Daily Mansy, Jan A, MD   10 mg at 01/24/20 0058  . budesonide (PULMICORT) nebulizer solution 0.25 mg  0.25 mg Nebulization BID Tyler Pita, MD   0.25 mg at 01/24/20 0847  . busPIRone (BUSPAR) tablet 5 mg  5 mg Oral QHS Mansy, Jan A, MD   5 mg at 01/22/20 2058  . Chlorhexidine Gluconate Cloth 2 % PADS 6 each  6 each Topical Daily Nicole Kindred A, DO   6 each at 01/23/20 1630  . diclofenac Sodium (VOLTAREN) 1 % topical gel 1 application  1 application Topical H4V PRN Mansy, Jan A, MD      . DOPamine (INTROPIN) 800 mg in dextrose 5 % 250 mL (3.2 mg/mL) infusion  0-10 mcg/kg/min Intravenous Titrated Tyler Pita, MD 11.31 mL/hr at 01/23/20 1837 5 mcg/kg/min at 01/23/20 1837  . enoxaparin (LOVENOX) injection 40 mg  40 mg Subcutaneous Q12H Mansy, Jan A, MD   40 mg at 01/24/20 0953  . escitalopram (LEXAPRO) tablet 10 mg  10 mg Oral Daily Mansy, Jan A, MD   10 mg at 01/22/20 0845  . fentaNYL (SUBLIMAZE) injection 25-50 mcg  25-50 mcg Intravenous Q3H PRN Tyler Pita, MD   25 mcg at 01/23/20 1810  . fluticasone (FLONASE) 50 MCG/ACT nasal spray 2 spray  2  spray Each Nare Daily Mansy, Jan A, MD   2 spray at 01/22/20 1022  . hydrOXYzine (ATARAX/VISTARIL) tablet 10 mg  10 mg Oral TID PRN Mansy, Jan  A, MD      . influenza vac split quadrivalent PF (FLUARIX) injection 0.5 mL  0.5 mL Intramuscular Tomorrow-1000 Nicole Kindred A, DO      . insulin aspart (novoLOG) injection 0-9 Units  0-9 Units Subcutaneous TID PC & HS Mansy, Arvella Merles, MD   1 Units at 01/22/20 1243  . lactulose (CHRONULAC) 10 GM/15ML solution 20 g  20 g Oral BID Mansy, Jan A, MD   20 g at 01/22/20 2059  . lidocaine (XYLOCAINE) 5 % ointment   Topical BID PRN Mansy, Jan A, MD      . methylPREDNISolone sodium succinate (SOLU-MEDROL) 40 mg/mL injection 40 mg  40 mg Intravenous Q12H Tyler Pita, MD   40 mg at 01/24/20 0557  . montelukast (SINGULAIR) tablet 10 mg  10 mg Oral QHS Mansy, Jan A, MD   10 mg at 01/24/20 0059  . naloxone [facility]) nasal spray 4 mg/0.1 mL  1 spray Nasal Once PRN Mansy, Jan A, MD      . nicotine (NICODERM CQ - dosed in mg/24 hours) patch 21 mg  21 mg Transdermal Q0600 Mansy, Jan A, MD   21 mg at 01/24/20 0954  . ondansetron (ZOFRAN) injection 4 mg  4 mg Intravenous Q6H PRN Mansy, Jan A, MD      . oxyCODONE (OXYCONTIN) 12 hr tablet 20 mg  20 mg Oral Q12H Mansy, Jan A, MD   20 mg at 01/22/20 2057  . pantoprazole (PROTONIX) EC tablet 40 mg  40 mg Oral BID Mansy, Jan A, MD   40 mg at 01/24/20 0059  . sodium chloride (OCEAN) 0.65 % nasal spray 1 spray  1 spray Each Nare PRN Nicole Kindred A, DO      . sodium chloride flush (NS) 0.9 % injection 3 mL  3 mL Intravenous Q12H Mansy, Jan A, MD   3 mL at 01/24/20 0953  . sodium chloride flush (NS) 0.9 % injection 3 mL  3 mL Intravenous PRN Mansy, Jan A, MD      . triamcinolone ointment (KENALOG) 0.5 % 1 application  1 application Topical BID Mansy, Arvella Merles, MD   1 application at 70/26/37 0103      Allergies: Allergies  Allergen Reactions  . Other Other (See Comments)    Gel capsules causes GI problems  . Mycophenolate  Sodium Other (See Comments)    transaminitis  . Azathioprine Nausea Only  . Mycophenolate Mofetil Nausea Only  . Tacrolimus Diarrhea and Nausea Only      Past Medical History: Past Medical History:  Diagnosis Date  . Asthma   . COPD (chronic obstructive pulmonary disease) ([place])   . Diabetes mellitus without complication ([place])   . Diastolic dysfunction    a. 03/2018 Echo [facility]): EF >55%, Gr1 DD. Nl RV fxn.  . Esophageal varices ([place])    a. 10/2019 EGD: gr III esoph varices s/p banding.  . Essential hypertension   . GI bleed   . HCV (hepatitis C virus)   . Hematemesis    a. 10/2019 EGD: Gr III esoph varices, incompletely eradicated, banded. Non-bleeding ulcers tx w/ argon plasma coag. Single non-bleeding angiodysplastic lesion in duodenum tx w/ APC.  [name] Hepatic cirrhosis (HCC)    a. h/o ETOH/Hep C.  . Hypertension   . Interstitial lung disease ([place])    a. Followed @ [facility] pulm. Prev on cellcept (caused GI side effects), Imuran (GI se), myfortic (elev LFTs). More recently on rituximab.  . Mixed hyperlipidemia   .  Morbid obesity ([place])   . O2 dependent   . PTSD (post-traumatic stress disorder)   . Sleep apnea   . Tobacco abuse      Past Surgical History: Past Surgical History:  Procedure Laterality Date  . CHOLECYSTECTOMY    . ESOPHAGOGASTRODUODENOSCOPY (EGD) WITH PROPOFOL N/A 11/04/2019   Procedure: ESOPHAGOGASTRODUODENOSCOPY (EGD) WITH PROPOFOL;  Surgeon: Lucilla Lame, MD;  Location: [facility] ENDOSCOPY;  Service: Endoscopy;  Laterality: N/A;  . ORIF TIBIA PLATEAU Left 07/02/2015   Procedure: OPEN REDUCTION INTERNAL FIXATION (ORIF) TIBIAL PLATEAU;  Surgeon: Thornton Park, MD;  Location: [facility] ORS;  Service: Orthopedics;  Laterality: Left;     Family History: Family History  Problem Relation Age of Onset  . Heart failure Mother      Social History: Social History   Socioeconomic History  . Marital status: Married    Spouse name: Not on file  . Number of children: Not on  file  . Years of education: Not on file  . Highest education level: Not on file  Occupational History  . Not on file  Tobacco Use  . Smoking status: Current Every Day Smoker    Packs/day: 0.25    Years: 36.00    Pack years: 9.00    Types: Cigarettes  . Smokeless tobacco: Never Used  Substance and Sexual Activity  . Alcohol use: Yes    Alcohol/week: 0.0 standard drinks    Comment: 2-3 drinks/wk. Prev heavy drinker.  . Drug use: Not Currently  . Sexual activity: Not on file  Other Topics Concern  . Not on file  Social History Narrative   Lives locally w/ her husband. Sedentary in setting of chronic DOE and O2 dependence.    Social Determinants of Health   Financial Resource Strain:   . Difficulty of Paying Living Expenses: Not on file  Food Insecurity:   . Worried About Charity fundraiser in the Last Year: Not on file  . Ran Out of Food in the Last Year: Not on file  Transportation Needs:   . Lack of Transportation (Medical): Not on file  . Lack of Transportation (Non-Medical): Not on file  Physical Activity:   . Days of Exercise per Week: Not on file  . Minutes of Exercise per Session: Not on file  Stress:   . Feeling of Stress : Not on file  Social Connections:   . Frequency of Communication with Friends and Family: Not on file  . Frequency of Social Gatherings with Friends and Family: Not on file  . Attends Religious Services: Not on file  . Active Member of Clubs or Organizations: Not on file  . Attends Archivist Meetings: Not on file  . Marital Status: Not on file  Intimate Partner Violence:   . Fear of Current or Ex-Partner: Not on file  . Emotionally Abused: Not on file  . Physically Abused: Not on file  . Sexually Abused: Not on file     Review of Systems: Not available as patient is on BiPAP Gen:  HEENT:  CV:  Resp:  GI: GU :  MS:  Derm:    Psych: Heme:  Neuro:  Endocrine  Vital Signs: Blood pressure (!) 87/60, pulse 74,  temperature (!) 96.7 F (35.9 C), temperature source Axillary, resp. rate (!) 22, height '5\' 2"'  (1.575 m), weight 120.9 kg, last menstrual period 11/03/2009, SpO2 96 %.   Intake/Output Summary (Last 24 hours) at 01/24/2020 1247 Last data filed at 01/24/2020 0833 Gross per 24  hour  Intake 20.69 ml  Output 1100 ml  Net -1079.31 ml    Weight trends: Filed Weights   01/22/20 0400 01/23/20 0515 01/24/20 0500  Weight: 119.7 kg 120.6 kg 120.9 kg    Physical Exam: General:  Critically ill-appearing, laying in the bed  HEENT  BiPAP mask in place  Lungs:  Coarse breath sounds bilaterally, NIPPV  Heart::  Tachycardic  Abdomen:  Soft, nondistended  Extremities:  2+ pitting edema  Neurologic:  Alert, able to answer simple questions  Skin:  No acute rashes  Foley:  In place       Lab results: Basic Metabolic Panel: Recent Labs  Lab 01/20/20 1948 01/22/20 0203 01/22/20 0507 01/23/20 0425 01/24/20 0322  NA   < >  --  140 137 138  K   < >  --  4.2 4.2 4.7  CL   < >  --  102 100 100  CO2   < >  --  32 29 29  GLUCOSE   < >  --  150* 95 126*  BUN   < >  --  16 21* 31*  CREATININE   < >  --  1.06* 2.27* 2.46*  CALCIUM   < >  --  8.2* 8.2* 8.3*  MG  --  1.9  --   --  2.0   < > = values in this interval not displayed.    Liver Function Tests: Recent Labs  Lab 01/23/20 1459  AST 20  ALT 22  ALKPHOS 47  BILITOT 0.9  PROT 7.0  ALBUMIN 3.0*   No results for input(s): LIPASE, AMYLASE in the last 168 hours. Recent Labs  Lab 01/23/20 0544  AMMONIA 49*    CBC: Recent Labs  Lab 01/20/20 1948 01/22/20 0507  WBC 8.4 8.0  NEUTROABS  --  5.0  HGB 11.8* 11.0*  HCT 38.0 38.7  MCV 83.9 90.2  PLT 142* 126*    Cardiac Enzymes: No results for input(s): CKTOTAL, TROPONINI in the last 168 hours.  BNP: Invalid input(s): POCBNP  CBG: Recent Labs  Lab 01/23/20 1556 01/24/20 0051 01/24/20 0600 01/24/20 0809 01/24/20 1150  GLUCAP 94 107* 120* 118* 95     Microbiology: Recent Results (from the past 720 hour(s))  Respiratory Panel by RT PCR (Flu A&B, Covid) - Nasopharyngeal Swab     Status: None   Collection Time: 01/21/20 12:50 PM   Specimen: Nasopharyngeal Swab  Result Value Ref Range Status   SARS Coronavirus 2 by RT PCR NEGATIVE NEGATIVE Final    Comment: (NOTE) SARS-CoV-2 target nucleic acids are NOT DETECTED.  The SARS-CoV-2 RNA is generally detectable in upper respiratoy specimens during the acute phase of infection. The lowest concentration of SARS-CoV-2 viral copies this assay can detect is 131 copies/mL. A negative result does not preclude SARS-Cov-2 infection and should not be used as the sole basis for treatment or other patient management decisions. A negative result may occur with  improper specimen collection/handling, submission of specimen other than nasopharyngeal swab, presence of viral mutation(s) within the areas targeted by this assay, and inadequate number of viral copies (<131 copies/mL). A negative result must be combined with clinical observations, patient history, and epidemiological information. The expected result is Negative.  Fact Sheet for Patients:  PinkCheek.be  Fact Sheet for Healthcare Providers:  GravelBags.it  This test is no t yet approved or cleared by the [place] FDA and  has been authorized for detection and/or diagnosis of SARS-CoV-2  by FDA under an Emergency Use Authorization (EUA). This EUA will remain  in effect (meaning this test can be used) for the duration of the COVID-19 declaration under Section 564(b)(1) of the Act, 21 U.S.C. section 360bbb-3(b)(1), unless the authorization is terminated or revoked sooner.     Influenza A by PCR NEGATIVE NEGATIVE Final   Influenza B by PCR NEGATIVE NEGATIVE Final    Comment: (NOTE) The Xpert Xpress SARS-CoV-2/FLU/RSV assay is intended as an aid in  the diagnosis of  influenza from Nasopharyngeal swab specimens and  should not be used as a sole basis for treatment. Nasal washings and  aspirates are unacceptable for Xpert Xpress SARS-CoV-2/FLU/RSV  testing.  Fact Sheet for Patients: PinkCheek.be  Fact Sheet for Healthcare Providers: GravelBags.it  This test is not yet approved or cleared by the [place] FDA and  has been authorized for detection and/or diagnosis of SARS-CoV-2 by  FDA under an Emergency Use Authorization (EUA). This EUA will remain  in effect (meaning this test can be used) for the duration of the  Covid-19 declaration under Section 564(b)(1) of the Act, 21  U.S.C. section 360bbb-3(b)(1), unless the authorization is  terminated or revoked. Performed at [facility], [address] [place], [place] [address]   MRSA PCR Screening     Status: None   Collection Time: 01/23/20  5:01 PM   Specimen: Nasal Mucosa; Nasopharyngeal  Result Value Ref Range Status   MRSA by PCR NEGATIVE NEGATIVE Final    Comment:        The GeneXpert MRSA Assay (FDA approved for NASAL specimens only), is one component of a comprehensive MRSA colonization surveillance program. It is not intended to diagnose MRSA infection nor to guide or monitor treatment for MRSA infections. Performed at [facility], [address] [place], [place] [address]      Coagulation Studies: No results for input(s): LABPROT, INR in the last 72 hours.  Urinalysis: No results for input(s): COLORURINE, LABSPEC, PHURINE, GLUCOSEU, HGBUR, BILIRUBINUR, KETONESUR, PROTEINUR, UROBILINOGEN, NITRITE, LEUKOCYTESUR in the last 72 hours.  Invalid input(s): APPERANCEUR      Imaging: ECHOCARDIOGRAM COMPLETE  Result Date: 01/22/2020    ECHOCARDIOGRAM REPORT   Patient Name:   [name] Date of Exam: 01/22/2020 Medical Rec #:  [id number]         Height:       62.0 in Accession #:    [id number]         Weight:       264.0 lb Date of Birth:  May 11, 1965         BSA:          2.151 m Patient Age:    76 years          BP:           145/78 mmHg Patient Gender: F                 HR:           90 bpm. Exam Location:  [facility] Procedure: 2D Echo Indications:     CHF-ACUTE DIASTOLIC 850.27/X41.28  History:         Patient has no prior history of Echocardiogram examinations.                  Risk Factors:Hypertension, Diabetes and Current Smoker.  Sonographer:     Avanell Shackleton Referring Phys:  [id number] [place] Diagnosing Phys: Neoma Laming MD IMPRESSIONS  1. Left  ventricular ejection fraction, by estimation, is 60 to 65%. The left ventricle has normal function. The left ventricle has no regional wall motion abnormalities. Left ventricular diastolic parameters are consistent with Grade I diastolic dysfunction (impaired relaxation).  2. Right ventricular systolic function is normal. The right ventricular size is normal.  3. The mitral valve is normal in structure. No evidence of mitral valve regurgitation. No evidence of mitral stenosis.  4. The aortic valve is normal in structure. Aortic valve regurgitation is not visualized. No aortic stenosis is present.  5. The inferior vena cava is normal in size with greater than 50% respiratory variability, suggesting right atrial pressure of 3 mmHg. FINDINGS  Left Ventricle: Left ventricular ejection fraction, by estimation, is 60 to 65%. The left ventricle has normal function. The left ventricle has no regional wall motion abnormalities. The left ventricular internal cavity size was normal in size. There is  no left ventricular hypertrophy. Left ventricular diastolic parameters are consistent with Grade I diastolic dysfunction (impaired relaxation). Right Ventricle: The right ventricular size is normal. No increase in right ventricular wall thickness. Right ventricular systolic function is normal. Left Atrium: Left atrial size was normal in size. Right Atrium: Right atrial  size was normal in size. Pericardium: There is no evidence of pericardial effusion. Mitral Valve: The mitral valve is normal in structure. No evidence of mitral valve regurgitation. No evidence of mitral valve stenosis. Tricuspid Valve: The tricuspid valve is normal in structure. Tricuspid valve regurgitation is not demonstrated. No evidence of tricuspid stenosis. Aortic Valve: The aortic valve is normal in structure. Aortic valve regurgitation is not visualized. No aortic stenosis is present. Pulmonic Valve: The pulmonic valve was normal in structure. Pulmonic valve regurgitation is not visualized. No evidence of pulmonic stenosis. Aorta: The aortic root is normal in size and structure. Venous: The inferior vena cava is normal in size with greater than 50% respiratory variability, suggesting right atrial pressure of 3 mmHg. IAS/Shunts: No atrial level shunt detected by color flow Doppler.  LEFT VENTRICLE PLAX 2D LVIDd:         4.80 cm  Diastology LVIDs:         3.15 cm  LV e' medial:    7.83 cm/s LV PW:         1.00 cm  LV E/e' medial:  14.2 LV IVS:        0.62 cm  LV e' lateral:   12.20 cm/s LVOT diam:     2.00 cm  LV E/e' lateral: 9.1 LVOT Area:     3.14 cm  RIGHT VENTRICLE         IVC TAPSE (M-mode): 2.4 cm  IVC diam: 1.96 cm LEFT ATRIUM             Index       RIGHT ATRIUM           Index LA diam:        4.10 cm 1.91 cm/m  RA Area:     15.70 cm LA Vol (A2C):   47.5 ml 22.08 ml/m RA Volume:   40.10 ml  18.64 ml/m LA Vol (A4C):   59.5 ml 27.66 ml/m LA Biplane Vol: 54.8 ml 25.48 ml/m   AORTA Ao Root diam: 3.00 cm MITRAL VALVE                TRICUSPID VALVE MV Area (PHT): 7.90 cm     TR Peak grad:   35.0 mmHg MV Decel Time: 96 msec  TR Vmax:        296.00 cm/s MV E velocity: 111.00 cm/s MV A velocity: 102.00 cm/s  SHUNTS MV E/A ratio:  1.09         Systemic Diam: 2.00 cm Neoma Laming MD Electronically signed by Neoma Laming MD Signature Date/Time: 01/22/2020/2:53:06 PM    Final       Assessment &  Plan: Pt is a 54 y.o.   female with  T2 DM, HTN, OSA, morbid obesity, Hep C, chronic lung diseae was admitted on 01/21/2020 with Hypoxia [R09.02] Acute CHF (congestive heart failure) ([place]) [I50.9] Congestive heart failure, unspecified HF chronicity, unspecified heart failure type ([place]) [I50.9] Acute on chronic diastolic CHF (congestive heart failure) ([place]) [I50.33]   # AKI # Volume overload Baseline Cr 0.73 on 01/20/2020 AKI likely secondary to volume shifts caused by aggressive diuresis Urine output appears to be improving with stabilization of blood pressure and hemodynamics Start midodrine, IV albumin for oncotic support Monitor diuresis in setting of midodrine and IV albumin  # Acute on Chronic Resp Failure with hypercapnea, underlying COPD Uses 2 L O2 at baseline  # Hep C , cirrhosis with esophageal varices, hepatic encephalopathy IV albumin for oncotic support  # DM-2 Lab Results  Component Value Date   HGBA1C 6.9 (H) 01/22/2020  Management as per internal medicine team       LOS: 2 Endi Lagman Candiss Norse 9/27/202112:47 PM    Note: This note was prepared with Dragon dictation. Any transcription errors are unintentional

## 2020-01-24 NOTE — Consult Note (Signed)
Reason for Consult: Acute respiratory failure with hypoxia and hypercapnia requiring BiPAP.  Referring Physician: [name], DO  [name] is an 54 y.o. female.  HPI:  Note that history is obtained from available records as the patient cannot provide due to acuity of condition on BiPAP.  This is a 54 year old morbidly obese woman, with a history as noted below transfer to the intensive care unit due to need for acute BiPAP.  The patient has chronic respiratory failure with hypoxia and hypercarbia due to interstitial lung disease, morbid obesity with alveolar hypoventilation, mixed restrictive/obstructive physiology due to interstitial lung disease and COPD among other issues.  She also carries a diagnosis of pulmonary arterial hypertension.  She is followed for her pulmonary issues at [facility].  On review of records it appears that the patient has had treatment with numerous agents for her ILD.  She has been treated with CellCept, Imuran and Myfortic.  All cause difficulties for the patient and recently she received rituximab (this is also known to have pulmonary toxicity).  She presented to [facility] 2 days ago with a complaint of anasarca.  She had issues with cough and upper airway congestion for which she had been placed on prednisone as an outpatient.  She had noted fluid retention.  This did not relieve with increased doses of Lasix.  She was admitted for management of the same however today became encephalopathic and required BiPAP.  She was transferred to the intensive care unit due to acute on chronic hypoxic and hypercarbic neck respiratory failure with respiratory acidosis.  She was given a dose of Lasix IV that resulted in little urine output and hypotension.  The patient cannot provide more detailed history.  She is currently on BiPAP she is easily arousable and will say "I feel better" she will follow commands readily.  No asterixis.[name]  Past Medical History:  Diagnosis Date   . Asthma   . COPD (chronic obstructive pulmonary disease) (HCC)   . Diabetes mellitus without complication (HCC)   . Diastolic dysfunction    a. 03/2018 Echo [facility]): EF >55%, Gr1 DD. Nl RV fxn.  . Esophageal varices (HCC)    a. 10/2019 EGD: gr III esoph varices s/p banding.  . Essential hypertension   . GI bleed   . HCV (hepatitis C virus)   . Hematemesis    a. 10/2019 EGD: Gr III esoph varices, incompletely eradicated, banded. Non-bleeding ulcers tx w/ argon plasma coag. Single non-bleeding angiodysplastic lesion in duodenum tx w/ APC.  [name] Hepatic cirrhosis (HCC)    a. h/o ETOH/Hep C.  . Hypertension   . Interstitial lung disease (HCC)    a. Followed @ [facility] pulm. Prev on cellcept (caused GI side effects), Imuran (GI se), myfortic (elev LFTs). More recently on rituximab.  . Mixed hyperlipidemia   . Morbid obesity (HCC)   . O2 dependent   . PTSD (post-traumatic stress disorder)   . Sleep apnea   . Tobacco abuse     Past Surgical History:  Procedure Laterality Date  . CHOLECYSTECTOMY    . ESOPHAGOGASTRODUODENOSCOPY (EGD) WITH PROPOFOL N/A 11/04/2019   Procedure: ESOPHAGOGASTRODUODENOSCOPY (EGD) WITH PROPOFOL;  Surgeon: [name], MD;  Location: [facility] ENDOSCOPY;  Service: Endoscopy;  Laterality: N/A;  . ORIF TIBIA PLATEAU Left 07/02/2015   Procedure: OPEN REDUCTION INTERNAL FIXATION (ORIF) TIBIAL PLATEAU;  Surgeon: Juanell Fairly, MD;  Location: [facility] ORS;  Service: Orthopedics;  Laterality: Left;    Family History  Problem Relation Age of Onset  .  Heart failure Mother     Social History:  reports that she has been smoking cigarettes. She has a 9.00 pack-year smoking history. She has never used smokeless tobacco. She reports current alcohol use. She reports previous drug use.  Allergies:  Allergies  Allergen Reactions  . Other Other (See Comments)    Gel capsules causes GI problems  . Mycophenolate Sodium Other (See Comments)    transaminitis  . Azathioprine Nausea Only  .  Mycophenolate Mofetil Nausea Only  . Tacrolimus Diarrhea and Nausea Only    Medications: I have reviewed the patient's current medications.  Results for orders placed or performed during the hospital encounter of 01/21/20 (from the past 48 hour(s))  HIV Antibody (routine testing w rflx)     Status: None   Collection Time: 01/22/20  2:03 AM  Result Value Ref Range   HIV Screen 4th Generation wRfx Non Reactive Non Reactive    Comment: Performed at [facility], [address], [place], [place] [address]  Hemoglobin A1c     Status: Abnormal   Collection Time: 01/22/20  2:03 AM  Result Value Ref Range   Hgb A1c MFr Bld 6.9 (H) 4.8 - 5.6 %    Comment: (NOTE) Pre diabetes:          5.7%-6.4%  Diabetes:              >6.4%  Glycemic control for   <7.0% adults with diabetes    Mean Plasma Glucose 151.33 mg/dL    Comment: Performed at [facility], [address] [place], [place] [address]  Magnesium     Status: None   Collection Time: 01/22/20  2:03 AM  Result Value Ref Range   Magnesium 1.9 1.7 - 2.4 mg/dL    Comment: Performed at [facility], [address] [place], [place] [address]  Troponin I (High Sensitivity)     Status: None   Collection Time: 01/22/20  5:07 AM  Result Value Ref Range   Troponin I (High Sensitivity) 6 <18 ng/L    Comment: (NOTE) Elevated high sensitivity troponin I (hsTnI) values and significant  changes across serial measurements may suggest ACS but many other  chronic and acute conditions are known to elevate hsTnI results.  Refer to the "Links" section for chest pain algorithms and additional  guidance. Performed at [facility], [address] [place], [place] [address]   Basic metabolic panel     Status: Abnormal   Collection Time: 01/22/20  5:07 AM  Result Value Ref Range   Sodium 140 135 - 145 mmol/L   Potassium 4.2 3.5 - 5.1 mmol/L   Chloride 102 98 - 111 mmol/L   CO2 32 22 - 32 mmol/L   Glucose, Bld 150  (H) 70 - 99 mg/dL    Comment: Glucose reference range applies only to samples taken after fasting for at least 8 hours.   BUN 16 6 - 20 mg/dL   Creatinine, Ser 2.95 (H) 0.44 - 1.00 mg/dL   Calcium 8.2 (L) 8.9 - 10.3 mg/dL   GFR calc non Af Amer 59 (L) >60 mL/min   GFR calc Af Amer >60 >60 mL/min   Anion gap 6 5 - 15    Comment: Performed at [facility], [address] [place], [place] [address]  CBC WITH DIFFERENTIAL     Status: Abnormal   Collection Time: 01/22/20  5:07 AM  Result Value Ref Range   WBC 8.0  4.0 - 10.5 K/uL   RBC 4.29 3.87 - 5.11 MIL/uL   Hemoglobin 11.0 (L) 12.0 - 15.0 g/dL   HCT 16.1 36 - 46 %   MCV 90.2 80.0 - 100.0 fL   MCH 25.6 (L) 26.0 - 34.0 pg   MCHC 28.4 (L) 30.0 - 36.0 g/dL   RDW 09.6 (H) 04.5 - 40.9 %   Platelets 126 (L) 150 - 400 K/uL   nRBC 0.0 0.0 - 0.2 %   Neutrophils Relative % 62 %   Neutro Abs 5.0 1.7 - 7.7 K/uL   Lymphocytes Relative 23 %   Lymphs Abs 1.8 0.7 - 4.0 K/uL   Monocytes Relative 12 %   Monocytes Absolute 1.0 0 - 1 K/uL   Eosinophils Relative 2 %   Eosinophils Absolute 0.1 0 - 0 K/uL   Basophils Relative 1 %   Basophils Absolute 0.1 0 - 0 K/uL   Immature Granulocytes 0 %   Abs Immature Granulocytes 0.03 0.00 - 0.07 K/uL    Comment: Performed at [facility], [address] [place], [place] [address]  Glucose, capillary     Status: Abnormal   Collection Time: 01/22/20  7:58 AM  Result Value Ref Range   Glucose-Capillary 121 (H) 70 - 99 mg/dL    Comment: Glucose reference range applies only to samples taken after fasting for at least 8 hours.  Glucose, capillary     Status: Abnormal   Collection Time: 01/22/20 11:31 AM  Result Value Ref Range   Glucose-Capillary 136 (H) 70 - 99 mg/dL    Comment: Glucose reference range applies only to samples taken after fasting for at least 8 hours.  Glucose, capillary     Status: None   Collection Time: 01/22/20  5:41 PM  Result Value Ref Range   Glucose-Capillary 92  70 - 99 mg/dL    Comment: Glucose reference range applies only to samples taken after fasting for at least 8 hours.  Glucose, capillary     Status: Abnormal   Collection Time: 01/22/20  8:39 PM  Result Value Ref Range   Glucose-Capillary 100 (H) 70 - 99 mg/dL    Comment: Glucose reference range applies only to samples taken after fasting for at least 8 hours.  Basic metabolic panel     Status: Abnormal   Collection Time: 01/23/20  4:25 AM  Result Value Ref Range   Sodium 137 135 - 145 mmol/L   Potassium 4.2 3.5 - 5.1 mmol/L   Chloride 100 98 - 111 mmol/L   CO2 29 22 - 32 mmol/L   Glucose, Bld 95 70 - 99 mg/dL    Comment: Glucose reference range applies only to samples taken after fasting for at least 8 hours.   BUN 21 (H) 6 - 20 mg/dL   Creatinine, Ser 4.78 (H) 0.44 - 1.00 mg/dL   Calcium 8.2 (L) 8.9 - 10.3 mg/dL   GFR calc non Af Amer 24 (L) >60 mL/min   GFR calc Af Amer 27 (L) >60 mL/min   Anion gap 8 5 - 15    Comment: Performed at [facility], [address] [place], [place] [address]  Blood gas, arterial     Status: Abnormal   Collection Time: 01/23/20  5:26 AM  Result Value Ref Range   FIO2 36.00    Delivery systems NASAL CANNULA    pH, Arterial 7.22 (L) 7.35 - 7.45   pCO2 arterial 74 (HH) 32 - 48 mmHg  Comment: RBV B MORRISON NP AT 0535 BY [place] Nail RRT ON [id number]    pO2, Arterial 50 (L) 83 - 108 mmHg   Bicarbonate 30.3 (H) 20.0 - 28.0 mmol/L   Acid-Base Excess 1.0 0.0 - 2.0 mmol/L   O2 Saturation 76.5 %   Patient temperature 37.0    Collection site LEFT RADIAL    Sample type ARTERIAL DRAW    Allens test (pass/fail) PASS PASS    Comment: Performed at [facility], [address] [place], [place] [address]  Ammonia     Status: Abnormal   Collection Time: 01/23/20  5:44 AM  Result Value Ref Range   Ammonia 49 (H) 9 - 35 umol/L    Comment: Performed at [facility], [address] [place], [place] [address]  Blood gas, arterial      Status: Abnormal (Preliminary result)   Collection Time: 01/23/20  7:03 AM  Result Value Ref Range   FIO2 50.00    Delivery systems BILEVEL POSITIVE AIRWAY PRESSURE    Inspiratory PAP 16    Expiratory PAP 6    pH, Arterial 7.16 (LL) 7.35 - 7.45    Comment: CRITICAL RESULT CALLED TO, READ BACK BY AND VERIFIED WITH: LINDSEY RN [id number] JGT    pCO2 arterial 85 (HH) 32 - 48 mmHg    Comment: CRITICAL RESULT CALLED TO, READ BACK BY AND VERIFIED WITH: LINDSEY [id number] JGT    pO2, Arterial 91 83 - 108 mmHg   Bicarbonate 30.3 (H) 20.0 - 28.0 mmol/L   Acid-base deficit 0.2 0.0 - 2.0 mmol/L   O2 Saturation 94.4 %   Patient temperature 37.0    Collection site RIGHT RADIAL    Sample type PENDING    Allens test (pass/fail) ARTERIAL DRAW (A) PASS    Comment: Performed at [facility], [address] [place], [place] [address]  Glucose, capillary     Status: Abnormal   Collection Time: 01/23/20  7:43 AM  Result Value Ref Range   Glucose-Capillary 126 (H) 70 - 99 mg/dL    Comment: Glucose reference range applies only to samples taken after fasting for at least 8 hours.  Glucose, capillary     Status: Abnormal   Collection Time: 01/23/20 11:24 AM  Result Value Ref Range   Glucose-Capillary 115 (H) 70 - 99 mg/dL    Comment: Glucose reference range applies only to samples taken after fasting for at least 8 hours.  Blood gas, arterial     Status: Abnormal   Collection Time: 01/23/20  1:59 PM  Result Value Ref Range   FIO2 50.00    Delivery systems BILEVEL POSITIVE AIRWAY PRESSURE    LHR 8 resp/min   Expiratory PAP 5    pH, Arterial 7.17 (LL) 7.35 - 7.45    Comment: CRITICAL RESULT CALLED TO, READ BACK BY AND VERIFIED WITH: DR. Braxston Quinter 01/23/20 AT 1622    pCO2 arterial 82 (HH) 32 - 48 mmHg    Comment: CRITICAL RESULT CALLED TO, READ BACK BY AND VERIFIED WITH: DR Jayme Cloud 01/23/20 AT 1622    pO2, Arterial 103 83 - 108 mmHg   Bicarbonate 29.9 (H) 20.0 - 28.0 mmol/L    Acid-base deficit 0.9 0.0 - 2.0 mmol/L   O2 Saturation 96.2 %   Patient temperature 37.0    Collection site RIGHT RADIAL    Sample type ARTHROGRAPHIS SPECIES    Allens test (pass/fail) POSITIVE (A) PASS   Mechanical Rate 8  Comment: Performed at [facility], [address] [place], [place] [address]  Hepatic function panel     Status: Abnormal   Collection Time: 01/23/20  2:59 PM  Result Value Ref Range   Total Protein 7.0 6.5 - 8.1 g/dL   Albumin 3.0 (L) 3.5 - 5.0 g/dL   AST 20 15 - 41 U/L   ALT 22 0 - 44 U/L   Alkaline Phosphatase 47 38 - 126 U/L   Total Bilirubin 0.9 0.3 - 1.2 mg/dL   Bilirubin, Direct 0.2 0.0 - 0.2 mg/dL   Indirect Bilirubin 0.7 0.3 - 0.9 mg/dL    Comment: Performed at [facility], [address] [place], [place] [address]  TSH     Status: None   Collection Time: 01/23/20  2:59 PM  Result Value Ref Range   TSH 0.816 0.350 - 4.500 uIU/mL    Comment: Performed by a 3rd Generation assay with a functional sensitivity of <=0.01 uIU/mL. Performed at [facility], [address] [place], [place] [address]   T4, free     Status: None   Collection Time: 01/23/20  2:59 PM  Result Value Ref Range   Free T4 0.75 0.61 - 1.12 ng/dL    Comment: (NOTE) Biotin ingestion may interfere with free T4 tests. If the results are inconsistent with the TSH level, previous test results, or the clinical presentation, then consider biotin interference. If needed, order repeat testing after stopping biotin. Performed at [facility], [address] [place], [place] [address]   Glucose, capillary     Status: None   Collection Time: 01/23/20  3:56 PM  Result Value Ref Range   Glucose-Capillary 94 70 - 99 mg/dL    Comment: Glucose reference range applies only to samples taken after fasting for at least 8 hours.  MRSA PCR Screening     Status: None   Collection Time: 01/23/20  5:01 PM   Specimen: Nasal Mucosa; Nasopharyngeal  Result  Value Ref Range   MRSA by PCR NEGATIVE NEGATIVE    Comment:        The GeneXpert MRSA Assay (FDA approved for NASAL specimens only), is one component of a comprehensive MRSA colonization surveillance program. It is not intended to diagnose MRSA infection nor to guide or monitor treatment for MRSA infections. Performed at [facility], [address] [place], [place] [address]   Blood gas, arterial     Status: Abnormal   Collection Time: 01/23/20  5:25 PM  Result Value Ref Range   FIO2 40.00    Delivery systems BILEVEL POSITIVE AIRWAY PRESSURE    VT 550 mL   LHR 16 resp/min   Expiratory PAP 10    pH, Arterial 7.18 (LL) 7.35 - 7.45    Comment: CRITICAL RESULT CALLED TO, READ BACK BY AND VERIFIED WITH:  DR. Vinnie Gombert 01/23/20 AT 1730    pCO2 arterial 81 (HH) 32 - 48 mmHg    Comment: CRITICAL RESULT CALLED TO, READ BACK BY AND VERIFIED WITH:  DR. Joycelynn Fritsche 01/23/20 AT 1730    pO2, Arterial 87 83 - 108 mmHg   Bicarbonate 30.2 (H) 20.0 - 28.0 mmol/L   Acid-Base Excess 0.1 0.0 - 2.0 mmol/L   O2 Saturation 93.9 %   Patient temperature 37.0    Collection site RIGHT RADIAL    Sample type ARTHROGRAPHIS SPECIES    Allens test (pass/fail) POSITIVE (A) PASS   Mechanical Rate 16     Comment: Performed at  [facility], [address] [place], [place] [address]    ECHOCARDIOGRAM COMPLETE  Result Date: 01/22/2020    ECHOCARDIOGRAM REPORT   Patient Name:   [name] Date of Exam: 01/22/2020 Medical Rec #:  [id number]         Height:       62.0 in Accession #:    [id number]        Weight:       264.0 lb Date of Birth:  07-Apr-1966         BSA:          2.151 m Patient Age:    54 years          BP:           145/78 mmHg Patient Gender: F                 HR:           90 bpm. Exam Location:  [facility] Procedure: 2D Echo Indications:     CHF-ACUTE DIASTOLIC 428.31/I50.31  History:         Patient has no prior history of Echocardiogram examinations.                  Risk  Factors:Hypertension, Diabetes and Current Smoker.  Sonographer:     Johnathan Hausen Referring Phys:  [id number] HQI A MANSY Diagnosing Phys: Adrian Blackwater MD IMPRESSIONS  1. Left ventricular ejection fraction, by estimation, is 60 to 65%. The left ventricle has normal function. The left ventricle has no regional wall motion abnormalities. Left ventricular diastolic parameters are consistent with Grade I diastolic dysfunction (impaired relaxation).  2. Right ventricular systolic function is normal. The right ventricular size is normal.  3. The mitral valve is normal in structure. No evidence of mitral valve regurgitation. No evidence of mitral stenosis.  4. The aortic valve is normal in structure. Aortic valve regurgitation is not visualized. No aortic stenosis is present.  5. The inferior vena cava is normal in size with greater than 50% respiratory variability, suggesting right atrial pressure of 3 mmHg. FINDINGS  Left Ventricle: Left ventricular ejection fraction, by estimation, is 60 to 65%. The left ventricle has normal function. The left ventricle has no regional wall motion abnormalities. The left ventricular internal cavity size was normal in size. There is  no left ventricular hypertrophy. Left ventricular diastolic parameters are consistent with Grade I diastolic dysfunction (impaired relaxation). Right Ventricle: The right ventricular size is normal. No increase in right ventricular wall thickness. Right ventricular systolic function is normal. Left Atrium: Left atrial size was normal in size. Right Atrium: Right atrial size was normal in size. Pericardium: There is no evidence of pericardial effusion. Mitral Valve: The mitral valve is normal in structure. No evidence of mitral valve regurgitation. No evidence of mitral valve stenosis. Tricuspid Valve: The tricuspid valve is normal in structure. Tricuspid valve regurgitation is not demonstrated. No evidence of tricuspid stenosis. Aortic Valve: The aortic valve  is normal in structure. Aortic valve regurgitation is not visualized. No aortic stenosis is present. Pulmonic Valve: The pulmonic valve was normal in structure. Pulmonic valve regurgitation is not visualized. No evidence of pulmonic stenosis. Aorta: The aortic root is normal in size and structure. Venous: The inferior vena cava is normal in size with greater than 50% respiratory variability, suggesting right atrial pressure of 3 mmHg. IAS/Shunts: No atrial level shunt detected by color flow Doppler.  LEFT VENTRICLE PLAX 2D LVIDd:  4.80 cm  Diastology LVIDs:         3.15 cm  LV e' medial:    7.83 cm/s LV PW:         1.00 cm  LV E/e' medial:  14.2 LV IVS:        0.62 cm  LV e' lateral:   12.20 cm/s LVOT diam:     2.00 cm  LV E/e' lateral: 9.1 LVOT Area:     3.14 cm  RIGHT VENTRICLE         IVC TAPSE (M-mode): 2.4 cm  IVC diam: 1.96 cm LEFT ATRIUM             Index       RIGHT ATRIUM           Index LA diam:        4.10 cm 1.91 cm/m  RA Area:     15.70 cm LA Vol (A2C):   47.5 ml 22.08 ml/m RA Volume:   40.10 ml  18.64 ml/m LA Vol (A4C):   59.5 ml 27.66 ml/m LA Biplane Vol: 54.8 ml 25.48 ml/m   AORTA Ao Root diam: 3.00 cm MITRAL VALVE                TRICUSPID VALVE MV Area (PHT): 7.90 cm     TR Peak grad:   35.0 mmHg MV Decel Time: 96 msec      TR Vmax:        296.00 cm/s MV E velocity: 111.00 cm/s MV A velocity: 102.00 cm/s  SHUNTS MV E/A ratio:  1.09         Systemic Diam: 2.00 cm Adrian Blackwater MD Electronically signed by Adrian Blackwater MD Signature Date/Time: 01/22/2020/2:53:06 PM    Final     Review of Systems  Unable to perform ROS: Acuity of condition    Blood pressure 107/63, pulse 82, temperature 98 F (36.7 C), temperature source Axillary, resp. rate (!) 21, height 5\' 2"  (1.575 m), weight 120.6 kg, last menstrual period 11/03/2009, SpO2 97 %.   Physical Exam GENERAL: Morbidly obese woman, on BiPAP, no use of accessories. HEAD: Normocephalic, atraumatic.  EYES: Pupils equal, round,  reactive to light.  No scleral icterus.  MOUTH: Patient with BiPAP mask in place NECK: Supple. No thyromegaly. Trachea midline.  Very thick neck.  No adenopathy. PULMONARY: Good air entry bilaterally.  Distant breath sounds, coarse, some faint end expiratory wheezes. CARDIOVASCULAR: S1 and S2. Regular rate and rhythm.  No rubs, murmurs or gallops heard. ABDOMEN: Obese, soft, nondistended. MUSCULOSKELETAL: No joint deformity, no clubbing, 2-3+ pitting edema on the lower extremities. NEUROLOGIC: Sleepy but arousable, follows commands.  No overt focal deficit.  No asterixis. SKIN: Intact,warm,dry. PSYCH: Cannot assess due to the acuity of the situation.   Assessment/Plan:  Acute on chronic hypoxic/hypercapnic respiratory failure Query ILD flare Asthma COPD overlap with exacerbation Continue noninvasive ventilation support We will switch mode to AVAPS Bronchodilators IV steroids High risk for intubation CT chest when more stable  Anasarca Volume overload History of PAH Not tolerating diuretics due to hypotension Trial of low-dose dopamine to open renal bed May help diurese  Morbid obesity Suspect alveolar hypoventilation OSA These issues adds complexity to her management Noninvasive ventilation as above Recommend weight loss  Prophylaxis Lovenox  Tobacco dependence due to cigarettes On nicotine replacement therapy Patient will be counseled with regards to smoking cessation when more alert   Thank you for allowing me to participate in this patient's care.  Gailen Shelter, MD [facility]   *This note was dictated using voice recognition software/Dragon.  Despite best efforts to proofread, errors can occur which can change the meaning.  Any change was purely unintentional.  Sarina Ser 01/24/2020, 12:00 AM

## 2020-01-24 NOTE — Progress Notes (Signed)
RT collected 1930 ABG, per RT provider was made aware. Patient tolerating BiPAP with no difficulties noted. ABG results are not uploading to Epic for an unknown reason.   -pH -7.19 -CO2 -76 -O2 -85 -HCO3 -29

## 2020-01-24 NOTE — Consult Note (Signed)
CRITICAL CARE - PROGRESS NOTE   Reason for Consult: Acute respiratory failure with hypoxia and hypercapnia requiring BiPAP.  Referring Physician: [name], DO  [name] is an 54 y.o. female.  HPI:  Note that history is obtained from available records as the patient cannot provide due to acuity of condition on BiPAP.  This is a 54 year old morbidly obese woman, with a history as noted below transfer to the intensive care unit due to need for acute BiPAP.  The patient has chronic respiratory failure with hypoxia and hypercarbia due to interstitial lung disease, morbid obesity with alveolar hypoventilation, mixed restrictive/obstructive physiology due to interstitial lung disease and COPD among other issues.  She also carries a diagnosis of pulmonary arterial hypertension.  She is followed for her pulmonary issues at [facility].  On review of records it appears that the patient has had treatment with numerous agents for her ILD.  She has been treated with CellCept, Imuran and Myfortic.  All cause difficulties for the patient and recently she received rituximab (this is also known to have pulmonary toxicity).  She presented to [facility] 2 days ago with a complaint of anasarca.  She had issues with cough and upper airway congestion for which she had been placed on prednisone as an outpatient.  She had noted fluid retention.  This did not relieve with increased doses of Lasix.  She was admitted for management of the same however today became encephalopathic and required BiPAP.  She was transferred to the intensive care unit due to acute on chronic hypoxic and hypercarbic neck respiratory failure with respiratory acidosis.  She was given a dose of Lasix IV that resulted in little urine output and hypotension.  The patient cannot provide more detailed history.  She is currently on BiPAP she is easily arousable and will say "I feel better" she will follow commands readily.  No  asterixis..   01/24/20- no overnight events.  On Nasal canula 4L/min   Past Medical History:  Diagnosis Date  . Asthma   . COPD (chronic obstructive pulmonary disease) (HCC)   . Diabetes mellitus without complication (HCC)   . Diastolic dysfunction    a. 03/2018 Echo [facility]): EF >55%, Gr1 DD. Nl RV fxn.  . Esophageal varices (HCC)    a. 10/2019 EGD: gr III esoph varices s/p banding.  . Essential hypertension   . GI bleed   . HCV (hepatitis C virus)   . Hematemesis    a. 10/2019 EGD: Gr III esoph varices, incompletely eradicated, banded. Non-bleeding ulcers tx w/ argon plasma coag. Single non-bleeding angiodysplastic lesion in duodenum tx w/ APC.  [name] Hepatic cirrhosis (HCC)    a. h/o ETOH/Hep C.  . Hypertension   . Interstitial lung disease (HCC)    a. Followed @ [facility] pulm. Prev on cellcept (caused GI side effects), Imuran (GI se), myfortic (elev LFTs). More recently on rituximab.  . Mixed hyperlipidemia   . Morbid obesity (HCC)   . O2 dependent   . PTSD (post-traumatic stress disorder)   . Sleep apnea   . Tobacco abuse     Past Surgical History:  Procedure Laterality Date  . CHOLECYSTECTOMY    . ESOPHAGOGASTRODUODENOSCOPY (EGD) WITH PROPOFOL N/A 11/04/2019   Procedure: ESOPHAGOGASTRODUODENOSCOPY (EGD) WITH PROPOFOL;  Surgeon: [name], MD;  Location: [facility] ENDOSCOPY;  Service: Endoscopy;  Laterality: N/A;  . ORIF TIBIA PLATEAU Left 07/02/2015   Procedure: OPEN REDUCTION INTERNAL FIXATION (ORIF) TIBIAL PLATEAU;  Surgeon: Juanell Fairly, MD;  Location:  [facility] ORS;  Service: Orthopedics;  Laterality: Left;    Family History  Problem Relation Age of Onset  . Heart failure Mother     Social History:  reports that she has been smoking cigarettes. She has a 9.00 pack-year smoking history. She has never used smokeless tobacco. She reports current alcohol use. She reports previous drug use.  Allergies:  Allergies  Allergen Reactions  . Other Other (See Comments)    Gel capsules  causes GI problems  . Mycophenolate Sodium Other (See Comments)    transaminitis  . Azathioprine Nausea Only  . Mycophenolate Mofetil Nausea Only  . Tacrolimus Diarrhea and Nausea Only    Medications: I have reviewed the patient's current medications.  Results for orders placed or performed during the hospital encounter of 01/21/20 (from the past 48 hour(s))  Glucose, capillary     Status: Abnormal   Collection Time: 01/22/20 11:31 AM  Result Value Ref Range   Glucose-Capillary 136 (H) 70 - 99 mg/dL    Comment: Glucose reference range applies only to samples taken after fasting for at least 8 hours.  Glucose, capillary     Status: None   Collection Time: 01/22/20  5:41 PM  Result Value Ref Range   Glucose-Capillary 92 70 - 99 mg/dL    Comment: Glucose reference range applies only to samples taken after fasting for at least 8 hours.  Glucose, capillary     Status: Abnormal   Collection Time: 01/22/20  8:39 PM  Result Value Ref Range   Glucose-Capillary 100 (H) 70 - 99 mg/dL    Comment: Glucose reference range applies only to samples taken after fasting for at least 8 hours.  Basic metabolic panel     Status: Abnormal   Collection Time: 01/23/20  4:25 AM  Result Value Ref Range   Sodium 137 135 - 145 mmol/L   Potassium 4.2 3.5 - 5.1 mmol/L   Chloride 100 98 - 111 mmol/L   CO2 29 22 - 32 mmol/L   Glucose, Bld 95 70 - 99 mg/dL    Comment: Glucose reference range applies only to samples taken after fasting for at least 8 hours.   BUN 21 (H) 6 - 20 mg/dL   Creatinine, Ser 1.61 (H) 0.44 - 1.00 mg/dL   Calcium 8.2 (L) 8.9 - 10.3 mg/dL   GFR calc non Af Amer 24 (L) >60 mL/min   GFR calc Af Amer 27 (L) >60 mL/min   Anion gap 8 5 - 15    Comment: Performed at [facility], [address] [place], [place] [address]  Blood gas, arterial     Status: Abnormal   Collection Time: 01/23/20  5:26 AM  Result Value Ref Range   FIO2 36.00    Delivery systems NASAL CANNULA    pH,  Arterial 7.22 (L) 7.35 - 7.45   pCO2 arterial 74 (HH) 32 - 48 mmHg    Comment: RBV B MORRISON NP AT 0535 BY N ELINSKI RRT ON [id number]    pO2, Arterial 50 (L) 83 - 108 mmHg   Bicarbonate 30.3 (H) 20.0 - 28.0 mmol/L   Acid-Base Excess 1.0 0.0 - 2.0 mmol/L   O2 Saturation 76.5 %   Patient temperature 37.0    Collection site LEFT RADIAL    Sample type ARTERIAL DRAW    Allens test (pass/fail) PASS PASS    Comment: Performed at [facility], [address] [place], [place] [address]  Ammonia  Status: Abnormal   Collection Time: 01/23/20  5:44 AM  Result Value Ref Range   Ammonia 49 (H) 9 - 35 umol/L    Comment: Performed at [facility], [address] [place], [place] [address]  Blood gas, arterial     Status: Abnormal (Preliminary result)   Collection Time: 01/23/20  7:03 AM  Result Value Ref Range   FIO2 50.00    Delivery systems BILEVEL POSITIVE AIRWAY PRESSURE    Inspiratory PAP 16    Expiratory PAP 6    pH, Arterial 7.16 (LL) 7.35 - 7.45    Comment: CRITICAL RESULT CALLED TO, READ BACK BY AND VERIFIED WITH: LINDSEY RN [id number] JGT    pCO2 arterial 85 (HH) 32 - 48 mmHg    Comment: CRITICAL RESULT CALLED TO, READ BACK BY AND VERIFIED WITH: LINDSEY [id number] JGT    pO2, Arterial 91 83 - 108 mmHg   Bicarbonate 30.3 (H) 20.0 - 28.0 mmol/L   Acid-base deficit 0.2 0.0 - 2.0 mmol/L   O2 Saturation 94.4 %   Patient temperature 37.0    Collection site RIGHT RADIAL    Sample type PENDING    Allens test (pass/fail) ARTERIAL DRAW (A) PASS    Comment: Performed at [facility], [address] [place], [place] [address]  Glucose, capillary     Status: Abnormal   Collection Time: 01/23/20  7:43 AM  Result Value Ref Range   Glucose-Capillary 126 (H) 70 - 99 mg/dL    Comment: Glucose reference range applies only to samples taken after fasting for at least 8 hours.  Glucose, capillary     Status: Abnormal   Collection Time: 01/23/20 11:24 AM   Result Value Ref Range   Glucose-Capillary 115 (H) 70 - 99 mg/dL    Comment: Glucose reference range applies only to samples taken after fasting for at least 8 hours.  Blood gas, arterial     Status: Abnormal   Collection Time: 01/23/20  1:59 PM  Result Value Ref Range   FIO2 50.00    Delivery systems BILEVEL POSITIVE AIRWAY PRESSURE    LHR 8 resp/min   Expiratory PAP 5    pH, Arterial 7.17 (LL) 7.35 - 7.45    Comment: CRITICAL RESULT CALLED TO, READ BACK BY AND VERIFIED WITH: DR. GONZALEZ 01/23/20 AT 1622    pCO2 arterial 82 (HH) 32 - 48 mmHg    Comment: CRITICAL RESULT CALLED TO, READ BACK BY AND VERIFIED WITH: DR Jayme Cloud 01/23/20 AT 1622    pO2, Arterial 103 83 - 108 mmHg   Bicarbonate 29.9 (H) 20.0 - 28.0 mmol/L   Acid-base deficit 0.9 0.0 - 2.0 mmol/L   O2 Saturation 96.2 %   Patient temperature 37.0    Collection site RIGHT RADIAL    Sample type ARTHROGRAPHIS SPECIES    Allens test (pass/fail) POSITIVE (A) PASS   Mechanical Rate 8     Comment: Performed at [facility], [address] [place], [place] [address]  Hepatic function panel     Status: Abnormal   Collection Time: 01/23/20  2:59 PM  Result Value Ref Range   Total Protein 7.0 6.5 - 8.1 g/dL   Albumin 3.0 (L) 3.5 - 5.0 g/dL   AST 20 15 - 41 U/L   ALT 22 0 - 44 U/L   Alkaline Phosphatase 47 38 - 126 U/L   Total Bilirubin 0.9 0.3 - 1.2 mg/dL   Bilirubin, Direct 0.2 0.0 - 0.2 mg/dL  Indirect Bilirubin 0.7 0.3 - 0.9 mg/dL    Comment: Performed at [facility], [address] [place], [place] [address]  TSH     Status: None   Collection Time: 01/23/20  2:59 PM  Result Value Ref Range   TSH 0.816 0.350 - 4.500 uIU/mL    Comment: Performed by a 3rd Generation assay with a functional sensitivity of <=0.01 uIU/mL. Performed at [facility], [address] [place], [place] [address]   T4, free     Status: None   Collection Time: 01/23/20  2:59 PM  Result Value Ref Range   Free  T4 0.75 0.61 - 1.12 ng/dL    Comment: (NOTE) Biotin ingestion may interfere with free T4 tests. If the results are inconsistent with the TSH level, previous test results, or the clinical presentation, then consider biotin interference. If needed, order repeat testing after stopping biotin. Performed at [facility], [address] [place], [place] [address]   Glucose, capillary     Status: None   Collection Time: 01/23/20  3:56 PM  Result Value Ref Range   Glucose-Capillary 94 70 - 99 mg/dL    Comment: Glucose reference range applies only to samples taken after fasting for at least 8 hours.  MRSA PCR Screening     Status: None   Collection Time: 01/23/20  5:01 PM   Specimen: Nasal Mucosa; Nasopharyngeal  Result Value Ref Range   MRSA by PCR NEGATIVE NEGATIVE    Comment:        The GeneXpert MRSA Assay (FDA approved for NASAL specimens only), is one component of a comprehensive MRSA colonization surveillance program. It is not intended to diagnose MRSA infection nor to guide or monitor treatment for MRSA infections. Performed at [facility], [address] [place], [place] [address]   Blood gas, arterial     Status: Abnormal   Collection Time: 01/23/20  5:25 PM  Result Value Ref Range   FIO2 40.00    Delivery systems BILEVEL POSITIVE AIRWAY PRESSURE    VT 550 mL   LHR 16 resp/min   Expiratory PAP 10    pH, Arterial 7.18 (LL) 7.35 - 7.45    Comment: CRITICAL RESULT CALLED TO, READ BACK BY AND VERIFIED WITH:  DR. GONZALEZ 01/23/20 AT 1730    pCO2 arterial 81 (HH) 32 - 48 mmHg    Comment: CRITICAL RESULT CALLED TO, READ BACK BY AND VERIFIED WITH:  DR. GONZALEZ 01/23/20 AT 1730    pO2, Arterial 87 83 - 108 mmHg   Bicarbonate 30.2 (H) 20.0 - 28.0 mmol/L   Acid-Base Excess 0.1 0.0 - 2.0 mmol/L   O2 Saturation 93.9 %   Patient temperature 37.0    Collection site RIGHT RADIAL    Sample type ARTHROGRAPHIS SPECIES    Allens test (pass/fail) POSITIVE (A)  PASS   Mechanical Rate 16     Comment: Performed at [facility], [address] [place], [place] [address]  Cortisol     Status: None   Collection Time: 01/23/20  6:21 PM  Result Value Ref Range   Cortisol, Plasma 16.0 ug/dL    Comment: (NOTE) AM    6.7 - 22.6 ug/dL PM   <13.2       ug/dL Performed at [facility], [address], [place], [place] [address]   Glucose, capillary     Status: Abnormal   Collection Time: 01/24/20 12:51 AM  Result Value Ref Range  Glucose-Capillary 107 (H) 70 - 99 mg/dL    Comment: Glucose reference range applies only to samples taken after fasting for at least 8 hours.  Basic metabolic panel     Status: Abnormal   Collection Time: 01/24/20  3:22 AM  Result Value Ref Range   Sodium 138 135 - 145 mmol/L   Potassium 4.7 3.5 - 5.1 mmol/L   Chloride 100 98 - 111 mmol/L   CO2 29 22 - 32 mmol/L   Glucose, Bld 126 (H) 70 - 99 mg/dL    Comment: Glucose reference range applies only to samples taken after fasting for at least 8 hours.   BUN 31 (H) 6 - 20 mg/dL   Creatinine, Ser 1.61 (H) 0.44 - 1.00 mg/dL   Calcium 8.3 (L) 8.9 - 10.3 mg/dL   GFR calc non Af Amer 21 (L) >60 mL/min   GFR calc Af Amer 25 (L) >60 mL/min   Anion gap 9 5 - 15    Comment: Performed at [facility], [address] [place], [place] [address]  Magnesium     Status: None   Collection Time: 01/24/20  3:22 AM  Result Value Ref Range   Magnesium 2.0 1.7 - 2.4 mg/dL    Comment: Performed at [facility], [address] [place], [place] [address]  Glucose, capillary     Status: Abnormal   Collection Time: 01/24/20  6:00 AM  Result Value Ref Range   Glucose-Capillary 120 (H) 70 - 99 mg/dL    Comment: Glucose reference range applies only to samples taken after fasting for at least 8 hours.  Glucose, capillary     Status: Abnormal   Collection Time: 01/24/20  8:09 AM  Result Value Ref Range   Glucose-Capillary 118 (H) 70 - 99 mg/dL    Comment:  Glucose reference range applies only to samples taken after fasting for at least 8 hours.  Protein / creatinine ratio, urine     Status: None   Collection Time: 01/24/20  8:33 AM  Result Value Ref Range   Creatinine, Urine 161 mg/dL   Total Protein, Urine 20 mg/dL    Comment: NO NORMAL RANGE ESTABLISHED FOR THIS TEST   Protein Creatinine Ratio 0.12 0.00 - 0.15 mg/mg[Cre]    Comment: Performed at [facility], [address] [place], [place] [address]    ECHOCARDIOGRAM COMPLETE  Result Date: 01/22/2020    ECHOCARDIOGRAM REPORT   Patient Name:   EMIKO OSORTO Date of Exam: 01/22/2020 Medical Rec #:  [id number]         Height:       62.0 in Accession #:    [id number]        Weight:       264.0 lb Date of Birth:  11-16-1965         BSA:          2.151 m Patient Age:    54 years          BP:           145/78 mmHg Patient Gender: F                 HR:           90 bpm. Exam Location:  [facility] Procedure: 2D Echo Indications:     CHF-ACUTE DIASTOLIC 428.31/I50.31  History:         Patient has no prior history of Echocardiogram examinations.  Risk Factors:Hypertension, Diabetes and Current Smoker.  Sonographer:     Johnathan Hausen Referring Phys:  [id number] VCB A MANSY Diagnosing Phys: Adrian Blackwater MD IMPRESSIONS  1. Left ventricular ejection fraction, by estimation, is 60 to 65%. The left ventricle has normal function. The left ventricle has no regional wall motion abnormalities. Left ventricular diastolic parameters are consistent with Grade I diastolic dysfunction (impaired relaxation).  2. Right ventricular systolic function is normal. The right ventricular size is normal.  3. The mitral valve is normal in structure. No evidence of mitral valve regurgitation. No evidence of mitral stenosis.  4. The aortic valve is normal in structure. Aortic valve regurgitation is not visualized. No aortic stenosis is present.  5. The inferior vena cava is normal in size with greater than 50%  respiratory variability, suggesting right atrial pressure of 3 mmHg. FINDINGS  Left Ventricle: Left ventricular ejection fraction, by estimation, is 60 to 65%. The left ventricle has normal function. The left ventricle has no regional wall motion abnormalities. The left ventricular internal cavity size was normal in size. There is  no left ventricular hypertrophy. Left ventricular diastolic parameters are consistent with Grade I diastolic dysfunction (impaired relaxation). Right Ventricle: The right ventricular size is normal. No increase in right ventricular wall thickness. Right ventricular systolic function is normal. Left Atrium: Left atrial size was normal in size. Right Atrium: Right atrial size was normal in size. Pericardium: There is no evidence of pericardial effusion. Mitral Valve: The mitral valve is normal in structure. No evidence of mitral valve regurgitation. No evidence of mitral valve stenosis. Tricuspid Valve: The tricuspid valve is normal in structure. Tricuspid valve regurgitation is not demonstrated. No evidence of tricuspid stenosis. Aortic Valve: The aortic valve is normal in structure. Aortic valve regurgitation is not visualized. No aortic stenosis is present. Pulmonic Valve: The pulmonic valve was normal in structure. Pulmonic valve regurgitation is not visualized. No evidence of pulmonic stenosis. Aorta: The aortic root is normal in size and structure. Venous: The inferior vena cava is normal in size with greater than 50% respiratory variability, suggesting right atrial pressure of 3 mmHg. IAS/Shunts: No atrial level shunt detected by color flow Doppler.  LEFT VENTRICLE PLAX 2D LVIDd:         4.80 cm  Diastology LVIDs:         3.15 cm  LV e' medial:    7.83 cm/s LV PW:         1.00 cm  LV E/e' medial:  14.2 LV IVS:        0.62 cm  LV e' lateral:   12.20 cm/s LVOT diam:     2.00 cm  LV E/e' lateral: 9.1 LVOT Area:     3.14 cm  RIGHT VENTRICLE         IVC TAPSE (M-mode): 2.4 cm  IVC diam:  1.96 cm LEFT ATRIUM             Index       RIGHT ATRIUM           Index LA diam:        4.10 cm 1.91 cm/m  RA Area:     15.70 cm LA Vol (A2C):   47.5 ml 22.08 ml/m RA Volume:   40.10 ml  18.64 ml/m LA Vol (A4C):   59.5 ml 27.66 ml/m LA Biplane Vol: 54.8 ml 25.48 ml/m   AORTA Ao Root diam: 3.00 cm MITRAL VALVE  TRICUSPID VALVE MV Area (PHT): 7.90 cm     TR Peak grad:   35.0 mmHg MV Decel Time: 96 msec      TR Vmax:        296.00 cm/s MV E velocity: 111.00 cm/s MV A velocity: 102.00 cm/s  SHUNTS MV E/A ratio:  1.09         Systemic Diam: 2.00 cm Shaukat Khan MD Electronically signed by Adrian BlackwaterShaukat Khan MD SAdrian Blackwaterignature Date/Time: 01/22/2020/2:53:06 PM    Final     Review of Systems  Unable to perform ROS: Acuity of condition    Blood pressure (!) 87/60, pulse 74, temperature (!) 96.7 F (35.9 C), temperature source Axillary, resp. rate (!) 22, height 5\' 2"  (1.575 m), weight 120.9 kg, last menstrual period 11/03/2009, SpO2 96 %.   Physical Exam GENERAL: Morbidly obese woman, on BiPAP, no use of accessories. HEAD: Normocephalic, atraumatic.  EYES: Pupils equal, round, reactive to light.  No scleral icterus.  MOUTH: Patient with BiPAP mask in place NECK: Supple. No thyromegaly. Trachea midline.  Very thick neck.  No adenopathy. PULMONARY: Good air entry bilaterally.  Distant breath sounds, coarse, some faint end expiratory wheezes. CARDIOVASCULAR: S1 and S2. Regular rate and rhythm.  No rubs, murmurs or gallops heard. ABDOMEN: Obese, soft, nondistended. MUSCULOSKELETAL: No joint deformity, no clubbing, 2-3+ pitting edema on the lower extremities. NEUROLOGIC: Sleepy but arousable, follows commands.  No overt focal deficit.  No asterixis. SKIN: Intact,warm,dry. PSYCH: Cannot assess due to the acuity of the situation.   Assessment/Plan:  Acute on chronic hypoxic/hypercapnic respiratory failure Query ILD flare Asthma COPD overlap with exacerbation Continue noninvasive ventilation  support We will switch mode to AVAPS Bronchodilators IV steroids High risk for intubation CT chest when more stable  Anasarca Volume overload History of PAH Not tolerating diuretics due to hypotension Trial of low-dose dopamine to open renal bed May help diurese  Morbid obesity Suspect alveolar hypoventilation OSA These issues adds complexity to her management Noninvasive ventilation as above Recommend weight loss  Prophylaxis Lovenox  Tobacco dependence due to cigarettes On nicotine replacement therapy Patient will be counseled with regards to smoking cessation when more alert   Thank you for allowing me to participate in this patient's care.  Critical care provider statement:    Critical care time (minutes):  33   Critical care time was exclusive of:  Separately billable procedures and  treating other patients   Critical care was necessary to treat or prevent imminent or  life-threatening deterioration of the following conditions:  acute on chronic hypoxemic respiratory failure, PAH, multiple comorbid conditions.    Critical care was time spent personally by me on the following  activities:  Development of treatment plan with patient or surrogate,  discussions with consultants, evaluation of patient's response to  treatment, examination of patient, obtaining history from patient or  surrogate, ordering and performing treatments and interventions, ordering  and review of laboratory studies and re-evaluation of patient's condition   I assumed direction of critical care for this patient from another  provider in my specialty: no      Vida RiggerFuad Dlynn Ranes, M.D.  Pulmonary & Critical Care Medicine  [facility]    *This note was dictated using voice recognition software/Dragon.  Despite best efforts to proofread, errors can occur which can change the meaning.  Any change was purely unintentional.  Vida RiggerFuad Evett Kassa 01/24/2020, 10:45 AM

## 2020-01-24 NOTE — TOC Initial Note (Addendum)
Transition of Care [facility]) - Initial/Assessment Note    Patient Details  Name: [name] MRN: [id number] Date of Birth: 05/01/1965  Transition of Care [facility]) CM/SW Contact:    Liliana Cline, LCSW Phone Number: 01/24/2020, 11:43 AM  Clinical Narrative:             Per rounds, patient lethargic and only responding to 1-2 questions. CSW called patient's spouse for Readmission Screen and Heart Failure Screen and to discuss Dr. Terence Lux recommendation for home Bipap per rounds. PCP is Dr. Lawerance Bach. [facility] [place], [place]. Patient lives with spouse, brother, and daughter. Patient was using Medicaid transportation for appointments. Spouse reported he thinks patient monitors her weight at home but was not sure. He reported patient has home oxygen, he could not recall what company provided it and checked her tanks which he said did not have a company name on them. He was agreeable to referral being made for home Bipap. No agency preference. Referral made to [facility] with [employer] [facility]. CSW will continue to follow.    Expected Discharge Plan: Home/Self Care Barriers to Discharge: Continued Medical Work up   Patient Goals and CMS Choice   CMS Medicare.gov Compare Post Acute Care list provided to:: Patient Represenative (must comment) Choice offered to / list presented to : Spouse  Expected Discharge Plan and Services Expected Discharge Plan: Home/Self Care       Living arrangements for the past 2 months: Single Family Home                 DME Arranged: Bipap DME Agency: [employer] Date DME Agency Contacted: 01/24/20   Representative spoke with at DME Agency: Oletha Cruel            Prior Living Arrangements/Services Living arrangements for the past 2 months: Single Family Home Lives with:: Adult Children, Spouse, Siblings Patient language and need for interpreter reviewed:: Yes Do you feel safe going back to the place where you live?: Yes      Need for Family  Participation in Patient Care: Yes (Comment) Care giver support system in place?: Yes (comment) Current home services: DME Criminal Activity/Legal Involvement Pertinent to Current Situation/Hospitalization: No - Comment as needed  Activities of Daily Living Home Assistive Devices/Equipment: None ADL Screening (condition at time of admission) Patient's cognitive ability adequate to safely complete daily activities?: Yes Is the patient deaf or have difficulty hearing?: No Does the patient have difficulty seeing, even when wearing glasses/contacts?: No Does the patient have difficulty concentrating, remembering, or making decisions?: No Patient able to express need for assistance with ADLs?: Yes Does the patient have difficulty dressing or bathing?: No Independently performs ADLs?: Yes (appropriate for developmental age) Does the patient have difficulty walking or climbing stairs?: No Weakness of Legs: None Weakness of Arms/Hands: None  Permission Sought/Granted Permission sought to share information with : Oceanographer granted to share information with : Yes, Verbal Permission Granted     Permission granted to share info w AGENCY: DME agency        Emotional Assessment         Alcohol / Substance Use: Not Applicable Psych Involvement: No (comment)  Admission diagnosis:  Hypoxia [R09.02] Acute CHF (congestive heart failure) (HCC) [I50.9] Congestive heart failure, unspecified HF chronicity, unspecified heart failure type (HCC) [I50.9] Acute on chronic diastolic CHF (congestive heart failure) (HCC) [I50.33] Patient Active Problem List   Diagnosis Date Noted  . Acute on chronic diastolic CHF (congestive heart  failure) (HCC) 01/22/2020  . Obstructive sleep apnea 01/22/2020  . Hypokalemia 01/22/2020  . COPD (chronic obstructive pulmonary disease) (HCC) 01/22/2020  . Dyslipidemia 01/22/2020  . Depression with anxiety 01/22/2020  . Hematemesis  11/04/2019  . Acute upper GI bleed 11/04/2019  . UGIB (upper gastrointestinal bleed) 11/04/2019  . Diabetes mellitus without complication (HCC)   . Essential hypertension   . Hepatitis C, chronic (HCC)   . Alcohol dependence (HCC)   . Melena   . Acute gastric ulcer with hemorrhage   . Dyspnea on exertion 10/31/2017  . Encounter for monitoring immunomodulating therapy 06/20/2017  . 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  . Encephalopathy pulmonary 01/02/2017  . ILD (interstitial lung disease) (HCC) 01/02/2017  . Other cirrhosis of liver (HCC) 01/02/2017  . Acute on chronic respiratory failure with hypercapnia (HCC) 12/27/2016  . Long term prescription benzodiazepine use 01/24/2016  . Neuritis of left sural nerve 01/24/2016  . Traumatic leg injury 01/24/2016  . Tobacco use disorder 08/10/2015  . Tibial plateau fracture 07/02/2015  . Gallstones 04/14/2013  . Pancreatitis 04/14/2013  . Obesity, Class III, BMI 40-49.9 (morbid obesity) (HCC) 03/05/2013   PCP:  Hyman Hopes, MD [facility]acy:   [facility] [address], [place] - [address] [place] [address] Phone: [phone] Fax: [phone]     Social Determinants of Health (SDOH) Interventions    Readmission Risk Interventions Readmission Risk Prevention Plan 01/24/2020  Transportation Screening Complete  PCP or Specialist Appt within 3-5 Days Complete  HRI or Home Care Consult Complete  Palliative Care Screening Complete  Medication Review (RN Care Manager) Complete  Some recent data might be hidden

## 2020-01-25 ENCOUNTER — Inpatient Hospital Stay: Payer: Medicaid Other

## 2020-01-25 LAB — GLUCOSE, CAPILLARY
Glucose-Capillary: 112 mg/dL — ABNORMAL HIGH (ref 70–99)
Glucose-Capillary: 118 mg/dL — ABNORMAL HIGH (ref 70–99)
Glucose-Capillary: 133 mg/dL — ABNORMAL HIGH (ref 70–99)
Glucose-Capillary: 144 mg/dL — ABNORMAL HIGH (ref 70–99)
Glucose-Capillary: 151 mg/dL — ABNORMAL HIGH (ref 70–99)
Glucose-Capillary: 181 mg/dL — ABNORMAL HIGH (ref 70–99)

## 2020-01-25 LAB — BASIC METABOLIC PANEL
Anion gap: 8 (ref 5–15)
BUN: 39 mg/dL — ABNORMAL HIGH (ref 6–20)
CO2: 28 mmol/L (ref 22–32)
Calcium: 8.5 mg/dL — ABNORMAL LOW (ref 8.9–10.3)
Chloride: 102 mmol/L (ref 98–111)
Creatinine, Ser: 1.31 mg/dL — ABNORMAL HIGH (ref 0.44–1.00)
GFR calc Af Amer: 53 mL/min — ABNORMAL LOW (ref >60)
GFR calc non Af Amer: 46 mL/min — ABNORMAL LOW (ref >60)
Glucose, Bld: 113 mg/dL — ABNORMAL HIGH (ref 70–99)
Potassium: 4.6 mmol/L (ref 3.5–5.1)
Sodium: 138 mmol/L (ref 135–145)

## 2020-01-25 LAB — BLOOD GAS, ARTERIAL
Acid-Base Excess: 2.8 mmol/L — ABNORMAL HIGH (ref 0.0–2.0)
Acid-base deficit: 0.2 mmol/L (ref 0.0–2.0)
Acid-base deficit: 0.8 mmol/L (ref 0.0–2.0)
Bicarbonate: 30.3 mmol/L — ABNORMAL HIGH (ref 20.0–28.0)
Bicarbonate: 29 mmol/L — ABNORMAL HIGH (ref 20.0–28.0)
Bicarbonate: 29.3 mmol/L — ABNORMAL HIGH (ref 20.0–28.0)
Delivery systems: POSITIVE
Expiratory PAP: 6
FIO2: 50
FIO2: 0.4
Inspiratory PAP: 16
MECHVT: 550 mL
O2 Saturation: 94.4 %
O2 Saturation: 97.8 %
O2 Saturation: 96.4 %
PEEP: 10 cmH2O
Patient temperature: 37
Patient temperature: 37
Patient temperature: 37
RATE: 16 resp/min
pCO2 arterial: 85 mmHg (ref 32.0–48.0)
pCO2 arterial: 76 mmHg (ref 32.0–48.0)
pCO2 arterial: 53 mmHg — ABNORMAL HIGH (ref 32.0–48.0)
pH, Arterial: 7.16 — CL (ref 7.350–7.450)
pH, Arterial: 7.19 — CL (ref 7.350–7.450)
pH, Arterial: 7.35 (ref 7.350–7.450)
pO2, Arterial: 91 mmHg (ref 83.0–108.0)
pO2, Arterial: 85 mmHg (ref 83.0–108.0)
pO2, Arterial: 71 mmHg — ABNORMAL LOW (ref 83.0–108.0)

## 2020-01-25 LAB — CBC
HCT: 36.1 % (ref 36.0–46.0)
Hemoglobin: 10.6 g/dL — ABNORMAL LOW (ref 12.0–15.0)
MCH: 25.8 pg — ABNORMAL LOW (ref 26.0–34.0)
MCHC: 29.4 g/dL — ABNORMAL LOW (ref 30.0–36.0)
MCV: 87.8 fL (ref 80.0–100.0)
Platelets: 120 10*3/uL — ABNORMAL LOW (ref 150–400)
RBC: 4.11 MIL/uL (ref 3.87–5.11)
RDW: 19.3 % — ABNORMAL HIGH (ref 11.5–15.5)
WBC: 7.6 10*3/uL (ref 4.0–10.5)
nRBC: 0 % (ref 0.0–0.2)

## 2020-01-25 NOTE — Progress Notes (Signed)
Patient has refused cpap for the night 

## 2020-01-25 NOTE — Progress Notes (Addendum)
PROGRESS NOTE    [name]   ZOX:[id number]  DOB: [birth year]  PCP: Hyman HopesBurns, Harriett P, MD    DOA: 01/21/2020 LOS: 3   Brief Narrative   [name]  is a 54 y.o. female with history of interstitial lung disease, COPD, 2 L/min home oxygen, dCHF, severe OSA, type 2 diabetes mellitus, hypertension, sleep apnea and hepatitis C, morbid obesity who presented to the ED on 01/21/20 with worsening shortness of breath and lower extremity edema and abdominal distention over the last couple weeks.   Reported associated PND, dry cough, wt gain.  PCP had recently increased her Lasix.  In the ED, stable vitals aside from spO2 88% on room air with ambulation, improved on 2L/min oxygen.  Labs notable for K 3.4, BNP 111.6, troponin normal x2, stable and improved mild anemia on CBC.  Respiratory viral panel including COVID-19 PCR and influenza was negative.  ECG normal sinus without acute ischemic changes. Chest x-ray from 9/23 showing diffuse peribronchial interstitial thickening thought to be related to chronic lung disease and subsegmental bibasal opacities favoring atelectasis.    Treated with IV Lasix in the ED and admitted to hospitalist service with cardiology consulted.     Assessment & Plan   Principal Problem:   Acute on chronic diastolic CHF (congestive heart failure) (HCC) Active Problems:   Essential hypertension   Obstructive sleep apnea   Hypokalemia   Hepatitis C, chronic (HCC)   ILD (interstitial lung disease) (HCC)   Obesity, Class III, BMI 40-49.9 (morbid obesity) (HCC)   COPD (chronic obstructive pulmonary disease) (HCC)   Dyslipidemia   Depression with anxiety   Hypotension - Improved, was briefly on pressors in ICU, transferred back out to progressive today.   Overnight 9/25-26, BP 80's over 50's and low of 75/47.  Likely due to aggressive diuresis and ?third spacing. --hepatic panel pending (no recent albumin level) --currently off Lasix --maintain MAP>=65  (avoid fluids, will need albumin, midodrine if safe for PO, or vasopressors) --PCCM, Nephrology consulted --Nephrology added midodrine and IV albumin  Acute Kidney Injury - not POA, secondary to diuresis.  Nephrology consulted.  Avoid nephrotoxin and hypotension.  Monitor BMP.  Renal ultrasound negative for obstruction. Urine prot/creatine ratio 161.   Improving since off Lasix and BP improved.  Volume Overoad - Improved.  Present on admission with hypoxia, SOB/DOE, edema, PND, mildly elevated BNP all clinically consistent with CHF but findings from Echo this admission point to pulmonary etiology vs third-spacing given her liver disease. Echo 9/25 - report EF 60 to 65%, grade 1 diastolic dysfunction.   Echo reviewed by Dr. Tenny Crawoss: diastolic parameters indeterminate but both atria normal in size which goes against significant diastolic dysfunction, no evidence for pulmonary hypertension. 9/28: Volume status improved, lower extremity edema resolved. --Cardiology consulted, signed off 9/26, call if needed [facility] --Initially treated with IV Lasix 40 mg twice daily --9/26 lasix stopped due to hypotension --Strict I/O's and daily weights  --Recommend to avoid beta-blockers due to frequent wheezing (COPD, ILD) --continue low sodium diet   Respiratory Acidosis - clinically undetermined if POA Acute on chronic respiratory failure with hypoxia and hypercapnea - uses 2 L/min at baseline.  On presentation needing 4 L/min in the setting of volume overload.  Hypoxia present on admission (hypercapnia unknown if POA).   Hypercapnia most likely secondary to obesity hypoventilation syndrome.  --Required continuous BiPAP for couple days --9/28 weaned to her baseline 2 L/min --Continue supplemental oxygen as needed to maintain O2 sat greater than  90%. --Monitor closely --PCCM following --Needs home NIV / Trilogy, TOC working on it  Essential hypertension - losartan and Lasix on hold due to  hypotension  Obstructive sleep apnea - CPAP ordered - patient refused overnight (9/25-26) and subsequently required continuous BiPAP.   Hypokalemia -K3.4 on admission, replaced.  Monitor and replace as needed.  Hepatitis C, chronic / hepatic cirrhosis /esophageal varices / Hepatic encephalopathy -gets frequent EGDs and manometry.  Admitted here in July with hematemesis and hypovolemic shock requiring vasopressors.  EGD at that time showed grade 3 esophageal varices, banded, also gastric and duodenal ulcers that were not bleeding. 9/26 ammonia elevated 49  --Continue lactulose --Goal 2-3 loose stools every day --Currently off diuretics due to hypotension --Appears was not on spironolactone but this is been added by cardiology  COPD Interstitial lung disease - chronic respiratory issues appear stable at this time, no wheezing to suggest COPD exacerbation.  Continue as needed albuterol nebs, Singulair, Spiriva.  Breo substituted for home Symbicort. --Pulmonology added IV steroids for ?ILD flare (Solu-medrol 40 mg IV BID started 9/26)  Dyslipidemia - continue Lipitor  Depression with anxiety -continue BuSpar, Lexapro, Vistaril as needed  Chronic pain - continue home OxyContin, Voltaren gel, lidocaine ointment  Morbid obesity: Body mass index is 47.5 kg/m.  Complicates overall care and prognosis including her respiratory status with sleep apnea and probable obesity hypoventilation.  Patient counseled on lifestyle modifications including weight loss and physical activity and the importance of weight loss for her overall health.    DVT prophylaxis: enoxaparin (LOVENOX) injection 40 mg Start: 01/21/20 2200   Diet:  Diet Orders (From admission, onward)    Start     Ordered   01/25/20 1551  Diet heart healthy/carb modified Room service appropriate? Yes; Fluid consistency: Thin; Fluid restriction: 1800 mL Fluid  Diet effective now       Question Answer Comment  Diet-HS Snack? Nothing    Room service appropriate? Yes   Fluid consistency: Thin   Fluid restriction: 1800 mL Fluid      01/25/20 1550            Code Status: Full Code    Subjective 01/25/20    Patient seen just after transferring out of ICU today.  Reports she feels well, denies SOB.  Swelling about gone.  She wants to go home.  She is nodding off while talking to me again today.  Not sure how long she wore bipap overnight.   Disposition Plan & Communication   Status is: Inpatient  Remains inpatient appropriate because:IV treatments appropriate due to intensity of illness or inability to take PO.  Respiratory failure on continuous BiPAP.   Dispo: The patient is from: Home              Anticipated d/c is to: Home              Anticipated d/c date is: 3 days              Patient currently is not medically stable to d/c.   Family Communication: None at bedside, will attempt to call   Consults, Procedures, Significant Events   Consultants:   Cardiology  PCCM  Procedures:   Echo 9/25 -EF 60 to 65%, grade 1 diastolic dysfunction, no valvular disease noted  Antimicrobials:  Anti-infectives (From admission, onward)   None         Objective   Vitals:   01/25/20 0800 01/25/20 1139 01/25/20 1140 01/25/20 1607  BP:  119/71 117/74  102/66  Pulse: 72 66  75  Resp: (!) Temp:  97.9 F (36.6 C)  98.2 F (36.8 C)  TempSrc:  Oral  Oral  SpO2: 91% 92%  100%  Weight:   117.8 kg   Height:    (1.575 m)     Intake/Output Summary (Last 24 hours) at 01/25/2020 1720 Last data filed at 01/25/2020 1535 Gross per 24 hour  Intake 237 ml  Output 950 ml  Net -713 ml   Filed Weights   01/24/20 0500 01/25/20 0500 01/25/20 1140  Weight: 120.9 kg 120.5 kg 117.8 kg    Physical Exam:  General exam: awake, alert, no acute distress, morbidly obese Respiratory system: diminished due to body habitus, no wheezes, b/l crackles. Cardiovascular system: normal S1/S2, RRR, no lower  extremity edema.   Central nervous system: A&O x3. no gross focal neurologic deficits, normal speech Extremities: moves all, normal tone, edema as above  Psychiatry: normal mood, congruent affect  Labs   Data Reviewed: I have personally reviewed following labs and imaging studies  CBC: Recent Labs  Lab 01/20/20 1948 01/22/20 0507 01/25/20 0410  WBC 8.4 8.0 7.6  NEUTROABS  --  5.0  --   HGB 11.8* 11.0* 10.6*  HCT 38.0 38.7 36.1  MCV 83.9 90.2 87.8  PLT 142* 126* 120*   Basic Metabolic Panel: Recent Labs  Lab 01/20/20 1948 01/22/20 0203 01/22/20 0507 01/23/20 0425 01/24/20 0322 01/25/20 0410  NA 138  --  140 137 138 138  K 3.4*  --  4.2 4.2 4.7 4.6  CL 100  --  102 100 100 102  CO2 28  --  32 GLUCOSE 96  --  150* 95 126* 113*  BUN 13  --  16 21* 31* 39*  CREATININE 0.73  --  1.06* 2.27* 2.46* 1.31*  CALCIUM 8.2*  --  8.2* 8.2* 8.3* 8.5*  MG  --  1.9  --   --  2.0  --    GFR: Estimated Creatinine Clearance: 59.8 mL/min (A) (by C-G formula based on SCr of 1.31 mg/dL (H)). Liver Function Tests: Recent Labs  Lab 01/21/20 1210 01/23/20 1459  AST 26 20  ALT 29 22  ALKPHOS 61 47  BILITOT 0.8 0.9  PROT 7.6 7.0  ALBUMIN 3.1* 3.0*   No results for input(s): LIPASE, AMYLASE in the last 168 hours. Recent Labs  Lab 01/23/20 0544  AMMONIA 49*   Coagulation Profile: No results for input(s): INR, PROTIME in the last 168 hours. Cardiac Enzymes: No results for input(s): CKTOTAL, CKMB, CKMBINDEX, TROPONINI in the last 168 hours. BNP (last 3 results) No results for input(s): PROBNP in the last 8760 hours. HbA1C: No results for input(s): HGBA1C in the last 72 hours. CBG: Recent Labs  Lab 01/24/20 1820 01/25/20 0048 01/25/20 0514 01/25/20 1209 01/25/20 1652  GLUCAP 139* 133* 118* 181* 112*   Lipid Profile: No results for input(s): CHOL, HDL, LDLCALC, TRIG, CHOLHDL, LDLDIRECT in the last 72 hours. Thyroid Function Tests: Recent Labs    01/23/20 1459   TSH 0.816  FREET4 0.75   Anemia Panel: No results for input(s): VITAMINB12, FOLATE, FERRITIN, TIBC, IRON, RETICCTPCT in the last 72 hours. Sepsis Labs: No results for input(s): PROCALCITON, LATICACIDVEN in the last 168 hours.  Recent Results (from the past 240 hour(s))  Respiratory Panel by RT PCR (Flu A&B, Covid) - Nasopharyngeal Swab     Status: None  Collection Time: 01/21/20 12:50 PM   Specimen: Nasopharyngeal Swab  Result Value Ref Range Status   SARS Coronavirus 2 by RT PCR NEGATIVE NEGATIVE Final    Comment: (NOTE) SARS-CoV-2 target nucleic acids are NOT DETECTED.  The SARS-CoV-2 RNA is generally detectable in upper respiratoy specimens during the acute phase of infection. The lowest concentration of SARS-CoV-2 viral copies this assay can detect is 131 copies/mL. A negative result does not preclude SARS-Cov-2 infection and should not be used as the sole basis for treatment or other patient management decisions. A negative result may occur with  improper specimen collection/handling, submission of specimen other than nasopharyngeal swab, presence of viral mutation(s) within the areas targeted by this assay, and inadequate number of viral copies (<131 copies/mL). A negative result must be combined with clinical observations, patient history, and epidemiological information. The expected result is Negative.  Fact Sheet for Patients:  https://www.moore.com/  Fact Sheet for Healthcare Providers:  https://www.young.biz/  This test is no t yet approved or cleared by the [place] FDA and  has been authorized for detection and/or diagnosis of SARS-CoV-2 by FDA under an Emergency Use Authorization (EUA). This EUA will remain  in effect (meaning this test can be used) for the duration of the COVID-19 declaration under Section 564(b)(1) of the Act, 21 U.S.C. section 360bbb-3(b)(1), unless the authorization is terminated or revoked  sooner.     Influenza A by PCR NEGATIVE NEGATIVE Final   Influenza B by PCR NEGATIVE NEGATIVE Final    Comment: (NOTE) The Xpert Xpress SARS-CoV-2/FLU/RSV assay is intended as an aid in  the diagnosis of influenza from Nasopharyngeal swab specimens and  should not be used as a sole basis for treatment. Nasal washings and  aspirates are unacceptable for Xpert Xpress SARS-CoV-2/FLU/RSV  testing.  Fact Sheet for Patients: https://www.moore.com/  Fact Sheet for Healthcare Providers: https://www.young.biz/  This test is not yet approved or cleared by the [place] FDA and  has been authorized for detection and/or diagnosis of SARS-CoV-2 by  FDA under an Emergency Use Authorization (EUA). This EUA will remain  in effect (meaning this test can be used) for the duration of the  Covid-19 declaration under Section 564(b)(1) of the Act, 21  U.S.C. section 360bbb-3(b)(1), unless the authorization is  terminated or revoked. Performed at [facility], [address] [place], [place] [address]   MRSA PCR Screening     Status: None   Collection Time: 01/23/20  5:01 PM   Specimen: Nasal Mucosa; Nasopharyngeal  Result Value Ref Range Status   MRSA by PCR NEGATIVE NEGATIVE Final    Comment:        The GeneXpert MRSA Assay (FDA approved for NASAL specimens only), is one component of a comprehensive MRSA colonization surveillance program. It is not intended to diagnose MRSA infection nor to guide or monitor treatment for MRSA infections. Performed at [facility], [address] [place], [place] [address]       Imaging Studies   US RENAL  Result Date: 13-Feb-2020 CLINICAL DATA:  Acute kidney injury EXAM: RENAL / URINARY TRACT ULTRASOUND COMPLETE COMPARISON:  CT 02/02/2013 FINDINGS: Right Kidney: Renal measurements: 11.8 x 5.5 x 4.8 cm = volume: 161 mL. Echogenicity within normal limits. No mass or hydronephrosis visualized.  Left Kidney: Renal measurements: 11.2 x 4.6 x 4.6 cm = volume: 123 mL. Echogenicity within normal limits. No mass or hydronephrosis visualized. Bladder: Not visualized.  Decompressed by Foley catheter. Other: None. IMPRESSION: Unremarkable sonographic appearance  of the kidneys without evidence of obstructive uropathy. Electronically Signed   By: Duanne Guess D.O.   On: 01/24/2020 14:20   DG Chest Port 1 View  Result Date: 01/25/2020 CLINICAL DATA:  Lung abnormality on chest x-ray EXAM: PORTABLE CHEST 1 VIEW COMPARISON:  01/20/2020 FINDINGS: Shallow inspiration. Mild cardiac enlargement. Patchy bilateral perihilar infiltrates suggesting multifocal pneumonia or edema. Mild progression since prior study. No pleural effusions. No pneumothorax. Mediastinal contours appear intact. IMPRESSION: Patchy bilateral perihilar infiltrates suggesting multifocal pneumonia or edema. Electronically Signed   By: Burman Nieves M.D.   On: 01/25/2020 04:53     Medications   Scheduled Meds: . arformoterol  15 mcg Nebulization BID  . atorvastatin  10 mg Oral Daily  . budesonide (PULMICORT) nebulizer solution  0.25 mg Nebulization BID  . busPIRone  5 mg Oral QHS  . Chlorhexidine Gluconate Cloth  6 each Topical Daily  . enoxaparin (LOVENOX) injection  40 mg Subcutaneous Q12H  . escitalopram  10 mg Oral Daily  . fluticasone  2 spray Each Nare Daily  . influenza vac split quadrivalent PF  0.5 mL Intramuscular Tomorrow-1000  . insulin aspart  0-9 Units Subcutaneous TID PC & HS  . lactulose  20 g Oral BID  . methylPREDNISolone (SOLU-MEDROL) injection  40 mg Intravenous Q12H  . midodrine  5 mg Oral TID WC  . montelukast  10 mg Oral QHS  . nicotine  21 mg Transdermal Q0600  . oxyCODONE  20 mg Oral Q12H  . pantoprazole  40 mg Oral BID  . sodium chloride flush  3 mL Intravenous Q12H  . triamcinolone ointment  1 application Topical BID   Continuous Infusions: . sodium chloride Stopped (01/25/20 1535)  .  DOPamine Stopped (01/24/20 0830)       LOS: 3 days    Time spent: 25 minutes with greater than 50% spent in coordination of care and direct patient contact    Pennie Banter, DO [facility]  01/25/2020, 5:20 PM    If 7PM-7AM, please contact night-coverage. How to contact the [facility] Attending or Consulting provider 7A - 7P or covering provider during after hours 7P -7A, for this patient?    1. Check the care team in [facility] and look for a) attending/consulting TRH provider listed and b) the [facility] team listed 2. Log into [email or web address] and use [facility] universal password to access. If you do not have the password, please contact the hospital operator. 3. Locate the [facility] provider you are looking for under [facility] and page to a number that you can be directly reached. 4. If you still have difficulty reaching the provider, please page the [facility] (Director on Call) for the Hospitalists listed on amion for assistance.

## 2020-01-25 NOTE — Progress Notes (Signed)
[facility]  ROUNDING NOTE   Subjective:  TeresaScogginsis a54 y.o.female with history of of type 2 diabetes mellitus, hypertension, sleep apnea and hepatitis C, morbid obesity who presented to the ED on 01/21/20 with worsening shortness of breath and lower extremity edema and abdominal distention over the last couple weeks.Nephrology was consulted for AKI. Patient found sitting up in bed this morning reports 'feeling better', in no acute distress.  09/27 0701 - 09/28 0700 In: 6.8 [I.V.:6.8] Out: 1725 [Urine:1725]  Lab Results  Component Value Date   CREATININE 1.31 (H) 01/25/2020   CREATININE 2.46 (H) 01/24/2020   CREATININE 2.27 (H) 01/23/2020      Objective:  Vital signs in last 24 hours:  Temp:  [97.9 F (36.6 C)-98.7 F (37.1 C)] 97.9 F (36.6 C) (09/28 1139) Pulse Rate:  [66-83] 66 (09/28 1139) Resp:  [16-30] 17 (09/28 1139) BP: (87-127)/(43-74) 117/74 (09/28 1139) SpO2:  [90 %-96 %] 92 % (09/28 1139) Weight:  [117.8 kg-120.5 kg] 117.8 kg (09/28 1140)  Weight change: -0.4 kg Filed Weights   01/24/20 0500 01/25/20 0500 01/25/20 1140  Weight: 120.9 kg 120.5 kg 117.8 kg    Intake/Output: I/O last 3 completed shifts: In: 51.9 [I.V.:51.9] Out: 2025 [Urine:2025]   Intake/Output this shift:  Total I/O In: 237 [P.O.:237] Out: 425 [Urine:425]  Physical Exam: General: Sitting up in bed, pleasant  Head: Normocephalic, atraumatic. Moist oral mucosal membranes  Eyes: Anicteric,  Neck: Supple, trachea midline  Lungs:  Mild diffuse Crackles + bilaterally  Heart: S1S2 +.Regular  Abdomen:  Soft, nontender,   Extremities:  Trace peripheral edema.  Neurologic: Nonfocal, moving all four extremities  Skin: No acute lesions       Basic Metabolic Panel: Recent Labs  Lab 01/20/20 1948 01/20/20 1948 01/22/20 0203 01/22/20 0507 01/22/20 0507 01/23/20 0425 01/24/20 0322 01/25/20 0410  NA 138  --   --  140  --  137 138 138  K 3.4*  --   --  4.2   --  4.2 4.7 4.6  CL 100  --   --  102  --  100 100 102  CO2 28  --   --  32  --  29 29 28   GLUCOSE 96  --   --  150*  --  95 126* 113*  BUN 13  --   --  16  --  21* 31* 39*  CREATININE 0.73  --   --  1.06*  --  2.27* 2.46* 1.31*  CALCIUM 8.2*   < >  --  8.2*   < > 8.2* 8.3* 8.5*  MG  --   --  1.9  --   --   --  2.0  --    < > = values in this interval not displayed.    Liver Function Tests: Recent Labs  Lab 01/21/20 1210 01/23/20 1459  AST 26 20  ALT 29 22  ALKPHOS 61 47  BILITOT 0.8 0.9  PROT 7.6 7.0  ALBUMIN 3.1* 3.0*   No results for input(s): LIPASE, AMYLASE in the last 168 hours. Recent Labs  Lab 01/23/20 0544  AMMONIA 49*    CBC: Recent Labs  Lab 01/20/20 1948 01/22/20 0507 01/25/20 0410  WBC 8.4 8.0 7.6  NEUTROABS  --  5.0  --   HGB 11.8* 11.0* 10.6*  HCT 38.0 38.7 36.1  MCV 83.9 90.2 87.8  PLT 142* 126* 120*    Cardiac Enzymes: No results for input(s): CKTOTAL, CKMB, CKMBINDEX,  TROPONINI in the last 168 hours.  BNP: Invalid input(s): POCBNP  CBG: Recent Labs  Lab 01/24/20 1150 01/24/20 1820 01/25/20 0048 01/25/20 0514 01/25/20 1209  GLUCAP 95 139* 133* 118* 181*    Microbiology: Results for orders placed or performed during the hospital encounter of 01/21/20  Respiratory Panel by RT PCR (Flu A&B, Covid) - Nasopharyngeal Swab     Status: None   Collection Time: 01/21/20 12:50 PM   Specimen: Nasopharyngeal Swab  Result Value Ref Range Status   SARS Coronavirus 2 by RT PCR NEGATIVE NEGATIVE Final    Comment: (NOTE) SARS-CoV-2 target nucleic acids are NOT DETECTED.  The SARS-CoV-2 RNA is generally detectable in upper respiratoy specimens during the acute phase of infection. The lowest concentration of SARS-CoV-2 viral copies this assay can detect is 131 copies/mL. A negative result does not preclude SARS-Cov-2 infection and should not be used as the sole basis for treatment or other patient management decisions. A negative result may  occur with  improper specimen collection/handling, submission of specimen other than nasopharyngeal swab, presence of viral mutation(s) within the areas targeted by this assay, and inadequate number of viral copies (<131 copies/mL). A negative result must be combined with clinical observations, patient history, and epidemiological information. The expected result is Negative.  Fact Sheet for Patients:  https://www.moore.com/  Fact Sheet for Healthcare Providers:  https://www.young.biz/  This test is no t yet approved or cleared by the [place] FDA and  has been authorized for detection and/or diagnosis of SARS-CoV-2 by FDA under an Emergency Use Authorization (EUA). This EUA will remain  in effect (meaning this test can be used) for the duration of the COVID-19 declaration under Section 564(b)(1) of the Act, 21 U.S.C. section 360bbb-3(b)(1), unless the authorization is terminated or revoked sooner.     Influenza A by PCR NEGATIVE NEGATIVE Final   Influenza B by PCR NEGATIVE NEGATIVE Final    Comment: (NOTE) The Xpert Xpress SARS-CoV-2/FLU/RSV assay is intended as an aid in  the diagnosis of influenza from Nasopharyngeal swab specimens and  should not be used as a sole basis for treatment. Nasal washings and  aspirates are unacceptable for Xpert Xpress SARS-CoV-2/FLU/RSV  testing.  Fact Sheet for Patients: https://www.moore.com/  Fact Sheet for Healthcare Providers: https://www.young.biz/  This test is not yet approved or cleared by the [place] FDA and  has been authorized for detection and/or diagnosis of SARS-CoV-2 by  FDA under an Emergency Use Authorization (EUA). This EUA will remain  in effect (meaning this test can be used) for the duration of the  Covid-19 declaration under Section 564(b)(1) of the Act, 21  U.S.C. section 360bbb-3(b)(1), unless the authorization is  terminated or  revoked. Performed at [facility], [address] [place], [place] [address]   MRSA PCR Screening     Status: None   Collection Time: 01/23/20  5:01 PM   Specimen: Nasal Mucosa; Nasopharyngeal  Result Value Ref Range Status   MRSA by PCR NEGATIVE NEGATIVE Final    Comment:        The GeneXpert MRSA Assay (FDA approved for NASAL specimens only), is one component of a comprehensive MRSA colonization surveillance program. It is not intended to diagnose MRSA infection nor to guide or monitor treatment for MRSA infections. Performed at [facility], [address] [place], [place] [address]     Coagulation Studies: No results for input(s): LABPROT, INR in the last 72 hours.  Urinalysis: No results for input(s): COLORURINE,  LABSPEC, PHURINE, GLUCOSEU, HGBUR, BILIRUBINUR, KETONESUR, PROTEINUR, UROBILINOGEN, NITRITE, LEUKOCYTESUR in the last 72 hours.  Invalid input(s): APPERANCEUR    Imaging: US RENAL  Result Date: 01/24/2020 CLINICAL DATA:  Acute kidney injury EXAM: RENAL / URINARY TRACT ULTRASOUND COMPLETE COMPARISON:  CT 02/02/2013 FINDINGS: Right Kidney: Renal measurements: 11.8 x 5.5 x 4.8 cm = volume: 161 mL. Echogenicity within normal limits. No mass or hydronephrosis visualized. Left Kidney: Renal measurements: 11.2 x 4.6 x 4.6 cm = volume: 123 mL. Echogenicity within normal limits. No mass or hydronephrosis visualized. Bladder: Not visualized.  Decompressed by Foley catheter. Other: None. IMPRESSION: Unremarkable sonographic appearance of the kidneys without evidence of obstructive uropathy. Electronically Signed   By: Duanne Guess D.O.   On: 01/24/2020 14:20   DG Chest Port 1 View  Result Date: 01/25/2020 CLINICAL DATA:  Lung abnormality on chest x-ray EXAM: PORTABLE CHEST 1 VIEW COMPARISON:  01/20/2020 FINDINGS: Shallow inspiration. Mild cardiac enlargement. Patchy bilateral perihilar infiltrates suggesting multifocal pneumonia or edema. Mild  progression since prior study. No pleural effusions. No pneumothorax. Mediastinal contours appear intact. IMPRESSION: Patchy bilateral perihilar infiltrates suggesting multifocal pneumonia or edema. Electronically Signed   By: Burman Nieves M.D.   On: 01/25/2020 04:53     Medications:   . sodium chloride Stopped (01/25/20 1535)  . DOPamine Stopped (01/24/20 0830)   . arformoterol  15 mcg Nebulization BID  . atorvastatin  10 mg Oral Daily  . budesonide (PULMICORT) nebulizer solution  0.25 mg Nebulization BID  . busPIRone  5 mg Oral QHS  . Chlorhexidine Gluconate Cloth  6 each Topical Daily  . enoxaparin (LOVENOX) injection  40 mg Subcutaneous Q12H  . escitalopram  10 mg Oral Daily  . fluticasone  2 spray Each Nare Daily  . influenza vac split quadrivalent PF  0.5 mL Intramuscular Tomorrow-1000  . insulin aspart  0-9 Units Subcutaneous TID PC & HS  . lactulose  20 g Oral BID  . methylPREDNISolone (SOLU-MEDROL) injection  40 mg Intravenous Q12H  . midodrine  5 mg Oral TID WC  . montelukast  10 mg Oral QHS  . nicotine  21 mg Transdermal Q0600  . oxyCODONE  20 mg Oral Q12H  . pantoprazole  40 mg Oral BID  . sodium chloride flush  3 mL Intravenous Q12H  . triamcinolone ointment  1 application Topical BID   sodium chloride, acetaminophen, albuterol, ALPRAZolam, diclofenac Sodium, fentaNYL (SUBLIMAZE) injection, hydrOXYzine, lidocaine, naloxone, ondansetron (ZOFRAN) IV, sodium chloride, sodium chloride flush  Assessment/ Plan:  [name] is a 54 y.o.  female with  T2 DM, HTN, OSA, morbid obesity, Hep C, chronic lung diseae was admitted on 01/21/2020 with Hypoxia [R09.02] Acute CHF (congestive heart failure) (HCC) [I50.9] Congestive heart failure, unspecified HF chronicity, unspecified heart failure type (HCC) [I50.9] Acute on chronic diastolic CHF (congestive heart failure) (HCC) [I50.33]   # AKI #Volume Overload Baseline Cr 0.73 on 01/20/2020 Creatinine today is 1.31  down from 2.46 yesterday UOP 1,725 ml for the preceding 24 hours  #Hypotension Blood Pressure readings within low normal range  #Hypokalemia K+ 4.6 today, improved  # Cirrhosis, Hep C with varices Midodrine    LOS: 3 Princy Raju 9/28/20213:42 PM  Patient was seen and examined with Denna Haggard, NP, DNP. Plan of care discussed. I agree with the note.

## 2020-01-26 DIAGNOSIS — I509 Heart failure, unspecified: Secondary | ICD-10-CM

## 2020-01-26 DIAGNOSIS — N179 Acute kidney failure, unspecified: Secondary | ICD-10-CM

## 2020-01-26 DIAGNOSIS — R0902 Hypoxemia: Secondary | ICD-10-CM

## 2020-01-26 LAB — BASIC METABOLIC PANEL
Anion gap: 9 (ref 5–15)
BUN: 33 mg/dL — ABNORMAL HIGH (ref 6–20)
CO2: 30 mmol/L (ref 22–32)
Calcium: 8.9 mg/dL (ref 8.9–10.3)
Chloride: 100 mmol/L (ref 98–111)
Creatinine, Ser: 0.83 mg/dL (ref 0.44–1.00)
GFR calc Af Amer: >60 mL/min (ref >60)
GFR calc non Af Amer: >60 mL/min (ref >60)
Glucose, Bld: 130 mg/dL — ABNORMAL HIGH (ref 70–99)
Potassium: 4.8 mmol/L (ref 3.5–5.1)
Sodium: 139 mmol/L (ref 135–145)

## 2020-01-26 LAB — CBC
HCT: 38.5 % (ref 36.0–46.0)
Hemoglobin: 11.4 g/dL — ABNORMAL LOW (ref 12.0–15.0)
MCH: 25.2 pg — ABNORMAL LOW (ref 26.0–34.0)
MCHC: 29.6 g/dL — ABNORMAL LOW (ref 30.0–36.0)
MCV: 85.2 fL (ref 80.0–100.0)
Platelets: 113 10*3/uL — ABNORMAL LOW (ref 150–400)
RBC: 4.52 MIL/uL (ref 3.87–5.11)
RDW: 19.6 % — ABNORMAL HIGH (ref 11.5–15.5)
WBC: 9.2 10*3/uL (ref 4.0–10.5)
nRBC: 0 % (ref 0.0–0.2)

## 2020-01-26 LAB — GLUCOSE, CAPILLARY
Glucose-Capillary: 116 mg/dL — ABNORMAL HIGH (ref 70–99)
Glucose-Capillary: 123 mg/dL — ABNORMAL HIGH (ref 70–99)

## 2020-01-26 MED ORDER — ACETAMINOPHEN 325 MG PO TABS: 650.0000 mg | ORAL_TABLET | ORAL | Status: DC | PRN

## 2020-01-26 NOTE — Plan of Care (Signed)
Pt AAox4, VS stable, up ad lib, No complaints. Discharge instructions reviewed with patient, questions answered. Received Flu shot today. IV removed. Pt took personal belongings at bedside. Plan for pt to discharge home with husband.         Problem: Education: Goal: Ability to demonstrate management of disease process will improve Outcome: Adequate for Discharge Goal: Ability to verbalize understanding of medication therapies will improve Outcome: Adequate for Discharge Goal: Individualized Educational Video(s) Outcome: Adequate for Discharge   Problem: Activity: Goal: Capacity to carry out activities will improve Outcome: Adequate for Discharge   Problem: Cardiac: Goal: Ability to achieve and maintain adequate cardiopulmonary perfusion will improve Outcome: Adequate for Discharge   Problem: Education: Goal: Knowledge of General Education information will improve Description: Including pain rating scale, medication(s)/side effects and non-pharmacologic comfort measures Outcome: Adequate for Discharge   Problem: Health Behavior/Discharge Planning: Goal: Ability to manage health-related needs will improve Outcome: Adequate for Discharge   Problem: Clinical Measurements: Goal: Ability to maintain clinical measurements within normal limits will improve Outcome: Adequate for Discharge Goal: Will remain free from infection Outcome: Adequate for Discharge Goal: Diagnostic test results will improve Outcome: Adequate for Discharge Goal: Respiratory complications will improve Outcome: Adequate for Discharge Goal: Cardiovascular complication will be avoided Outcome: Adequate for Discharge   Problem: Activity: Goal: Risk for activity intolerance will decrease Outcome: Adequate for Discharge   Problem: Nutrition: Goal: Adequate nutrition will be maintained Outcome: Adequate for Discharge   Problem: Coping: Goal: Level of anxiety will decrease Outcome: Adequate for  Discharge   Problem: Elimination: Goal: Will not experience complications related to bowel motility Outcome: Adequate for Discharge Goal: Will not experience complications related to urinary retention Outcome: Adequate for Discharge   Problem: Pain Managment: Goal: General experience of comfort will improve Outcome: Adequate for Discharge   Problem: Safety: Goal: Ability to remain free from injury will improve Outcome: Adequate for Discharge   Problem: Skin Integrity: Goal: Risk for impaired skin integrity will decrease Outcome: Adequate for Discharge

## 2020-01-26 NOTE — Progress Notes (Signed)
   Heart Failure Nurse Navigator Note:  HFpEF- 60-65%, Grade 1 Diastolic Dysfunction.  She presented to the ED with complaints of worsening SOB,PND, weight gain and dry cough.PCP had recently increased her lasix.  02 saturations in the ED on room air were 88%, improved with 2 liter 02.  She was treated with IV lasix.    Comorbidities:  COPD OSA Diabetes Hypertension Morbid Obesity  Medications:  Lipitor 10 mg daily Midodrine 5 mg TID Not on beta blockers due to wheezing.  Labs:  Sodium 139, potassium 4.8, BUN 33, creatinine 0.83, hemoglobin 11.4, HA1c 6.9 ,BNP on admission 111.6  Intake-237 ml Output- 1725 ml  Weight- 119.1 kg (yesterday 117.8)  Assessment:  General- she is awake and alert, states she is going home today.  HEENT-normocephalic, pupils equal.  Neck- no JVD.  Cardiac- heart tones distant.  Chest- lung clear bilaterally to posterior auscultation.  GI- abdomen rounded, soft.  Skin-intact.  Neuro- moves all four extremities, speech clear.  Psych- is pleasant and appropriate.  Discussed heart failure with patient, the importance of daily weight and recording, notifying PCP with weight gain of 2 pounds over night or 5 pounds in one week.  Also  to report PND or orthopnea.   To abstain from using salt at the table and avoid food that are high in sodium, ie canned soups, vegetables(unless she would rinse them well, convenience   foods, TV dinners,ham, processed meats.  Given heart failure booklet.  She states that is smokes 1 1/2 pack of  cigarettes daily.  She is going to stop. Encouragement given.   Tresa Endo RN, CHFN

## 2020-01-26 NOTE — Discharge Instructions (Signed)
Chronic Obstructive Pulmonary Disease Chronic obstructive pulmonary disease (COPD) is a long-term (chronic) lung problem. When you have COPD, it is hard for air to get in and out of your lungs. Usually the condition gets worse over time, and your lungs will never return to normal. There are things you can do to keep yourself as healthy as possible.  Your doctor may treat your condition with: ? Medicines. ? Oxygen. ? Lung surgery.  Your doctor may also recommend: ? Rehabilitation. This includes steps to make your body work better. It may involve a team of specialists. ? Quitting smoking, if you smoke. ? Exercise and changes to your diet. ? Comfort measures (palliative care). Follow these instructions at home: Medicines  Take over-the-counter and prescription medicines only as told by your doctor.  Talk to your doctor before taking any cough or allergy medicines. You may need to avoid medicines that cause your lungs to be dry. Lifestyle  If you smoke, stop. Smoking makes the problem worse. If you need help quitting, ask your doctor.  Avoid being around things that make your breathing worse. This may include smoke, chemicals, and fumes.  Stay active, but remember to rest as well.  Learn and use tips on how to relax.  Make sure you get enough sleep. Most adults need at least 7 hours of sleep every night.  Eat healthy foods. Eat smaller meals more often. Rest before meals. Controlled breathing Learn and use tips on how to control your breathing as told by your doctor. Try:  Breathing in (inhaling) through your nose for 1 second. Then, pucker your lips and breath out (exhale) through your lips for 2 seconds.  Putting one hand on your belly (abdomen). Breathe in slowly through your nose for 1 second. Your hand on your belly should move out. Pucker your lips and breathe out slowly through your lips. Your hand on your belly should move in as you breathe out.  Controlled coughing Learn  and use controlled coughing to clear mucus from your lungs. Follow these steps: 1. Lean your head a little forward. 2. Breathe in deeply. 3. Try to hold your breath for 3 seconds. 4. Keep your mouth slightly open while coughing 2 times. 5. Spit any mucus out into a tissue. 6. Rest and do the steps again 1 or 2 times as needed. General instructions  Make sure you get all the shots (vaccines) that your doctor recommends. Ask your doctor about a flu shot and a pneumonia shot.  Use oxygen therapy and pulmonary rehabilitation if told by your doctor. If you need home oxygen therapy, ask your doctor if you should buy a tool to measure your oxygen level (oximeter).  Make a COPD action plan with your doctor. This helps you to know what to do if you feel worse than usual.  Manage any other conditions you have as told by your doctor.  Avoid going outside when it is very hot, cold, or humid.  Avoid people who have a sickness you can catch (contagious).  Keep all follow-up visits as told by your doctor. This is important. Contact a doctor if:  You cough up more mucus than usual.  There is a change in the color or thickness of the mucus.  It is harder to breathe than usual.  Your breathing is faster than usual.  You have trouble sleeping.  You need to use your medicines more often than usual.  You have trouble doing your normal activities such as getting dressed   or walking around the house. Get help right away if:  You have shortness of breath while resting.  You have shortness of breath that stops you from: ? Being able to talk. ? Doing normal activities.  Your chest hurts for longer than 5 minutes.  Your skin color is more blue than usual.  Your pulse oximeter shows that you have low oxygen for longer than 5 minutes.  You have a fever.  You feel too tired to breathe normally. Summary  Chronic obstructive pulmonary disease (COPD) is a long-term lung problem.  The way your  lungs work will never return to normal. Usually the condition gets worse over time. There are things you can do to keep yourself as healthy as possible.  Take over-the-counter and prescription medicines only as told by your doctor.  If you smoke, stop. Smoking makes the problem worse. This information is not intended to replace advice given to you by your health care provider. Make sure you discuss any questions you have with your health care provider. Document Revised: 03/28/2017 Document Reviewed: 05/20/2016 Elsevier Patient Education  2020 [employer]  

## 2020-01-26 NOTE — Progress Notes (Signed)
[facility]  ROUNDING NOTE   Subjective Patient reports feeling much better, in no acute distress. No c/o SOB,nausea or vomiting. Primary team planning discharge today.  09/28 0701 - 09/29 0700 In: 237 [P.O.:237] Out: 1725 [Urine:1725]  Lab Results  Component Value Date   CREATININE 0.83 01/26/2020   CREATININE 1.31 (H) 01/25/2020   CREATININE 2.46 (H) 01/24/2020      Objective:  Vital signs in last 24 hours:  Temp:  [97.7 F (36.5 C)-98.7 F (37.1 C)] 98.7 F (37.1 C) (09/29 0753) Pulse Rate:  [64-75] 64 (09/29 0753) Resp:  [18-19] 18 (09/29 0753) BP: (102-133)/(64-77) 133/77 (09/29 0753) SpO2:  [95 %-100 %] 95 % (09/29 0801) Weight:  [119.1 kg] 119.1 kg (09/29 0522)  Weight change: -2.701 kg Filed Weights   01/25/20 0500 01/25/20 1140 01/26/20 0522  Weight: 120.5 kg 117.8 kg 119.1 kg    Intake/Output: I/O last 3 completed shifts: In: 237 [P.O.:237] Out: 2175 [Urine:2175]   Intake/Output this shift:  Total I/O In: 240 [P.O.:240] Out: 600 [Urine:600]  Physical Exam: General: In no acute distress  Head: Normocephalic, atraumatic. Moist oral mucosal membranes  Eyes: Anicteric,  Neck: Supple, trachea midline  Lungs:  Mild diffuse Crackles + bilaterally  Heart: .Regular  Abdomen:  Soft, nontender,   Extremities:  No peripheral edema.  Neurologic: Nonfocal, moving all four extremities  Skin: No acute lesions or rashes       Basic Metabolic Panel: Recent Labs  Lab 01/20/20 1948 01/22/20 0203 01/22/20 0507 01/22/20 0507 01/23/20 0425 01/23/20 0425 01/24/20 0322 01/25/20 0410 01/26/20 0401  NA   < >  --  140  --  137  --  138 138 139  K   < >  --  4.2  --  4.2  --  4.7 4.6 4.8  CL   < >  --  102  --  100  --  100 102 100  CO2   < >  --  32  --  29  --  29 28 30   GLUCOSE   < >  --  150*  --  95  --  126* 113* 130*  BUN   < >  --  16  --  21*  --  31* 39* 33*  CREATININE   < >  --  1.06*  --  2.27*  --  2.46* 1.31* 0.83  CALCIUM   <  >  --  8.2*   < > 8.2*   < > 8.3* 8.5* 8.9  MG  --  1.9  --   --   --   --  2.0  --   --    < > = values in this interval not displayed.    Liver Function Tests: Recent Labs  Lab 01/21/20 1210 01/23/20 1459  AST 26 20  ALT 29 22  ALKPHOS 61 47  BILITOT 0.8 0.9  PROT 7.6 7.0  ALBUMIN 3.1* 3.0*   No results for input(s): LIPASE, AMYLASE in the last 168 hours. Recent Labs  Lab 01/23/20 0544  AMMONIA 49*    CBC: Recent Labs  Lab 01/20/20 1948 01/22/20 0507 01/25/20 0410 01/26/20 0401  WBC 8.4 8.0 7.6 9.2  NEUTROABS  --  5.0  --   --   HGB 11.8* 11.0* 10.6* 11.4*  HCT 38.0 38.7 36.1 38.5  MCV 83.9 90.2 87.8 85.2  PLT 142* 126* 120* 113*    Cardiac Enzymes: No results for input(s): CKTOTAL, CKMB,  CKMBINDEX, TROPONINI in the last 168 hours.  BNP: Invalid input(s): POCBNP  CBG: Recent Labs  Lab 01/25/20 1652 01/25/20 2042 01/25/20 2334 01/26/20 0514 01/26/20 0754  GLUCAP 112* 151* 144* 116* 123*    Microbiology: Results for orders placed or performed during the hospital encounter of 01/21/20  Respiratory Panel by RT PCR (Flu A&B, Covid) - Nasopharyngeal Swab     Status: None   Collection Time: 01/21/20 12:50 PM   Specimen: Nasopharyngeal Swab  Result Value Ref Range Status   SARS Coronavirus 2 by RT PCR NEGATIVE NEGATIVE Final    Comment: (NOTE) SARS-CoV-2 target nucleic acids are NOT DETECTED.  The SARS-CoV-2 RNA is generally detectable in upper respiratoy specimens during the acute phase of infection. The lowest concentration of SARS-CoV-2 viral copies this assay can detect is 131 copies/mL. A negative result does not preclude SARS-Cov-2 infection and should not be used as the sole basis for treatment or other patient management decisions. A negative result may occur with  improper specimen collection/handling, submission of specimen other than nasopharyngeal swab, presence of viral mutation(s) within the areas targeted by this assay, and inadequate  number of viral copies (<131 copies/mL). A negative result must be combined with clinical observations, patient history, and epidemiological information. The expected result is Negative.  Fact Sheet for Patients:  https://www.moore.com/  Fact Sheet for Healthcare Providers:  https://www.young.biz/  This test is no t yet approved or cleared by the [place] FDA and  has been authorized for detection and/or diagnosis of SARS-CoV-2 by FDA under an Emergency Use Authorization (EUA). This EUA will remain  in effect (meaning this test can be used) for the duration of the COVID-19 declaration under Section 564(b)(1) of the Act, 21 U.S.C. section 360bbb-3(b)(1), unless the authorization is terminated or revoked sooner.     Influenza A by PCR NEGATIVE NEGATIVE Final   Influenza B by PCR NEGATIVE NEGATIVE Final    Comment: (NOTE) The Xpert Xpress SARS-CoV-2/FLU/RSV assay is intended as an aid in  the diagnosis of influenza from Nasopharyngeal swab specimens and  should not be used as a sole basis for treatment. Nasal washings and  aspirates are unacceptable for Xpert Xpress SARS-CoV-2/FLU/RSV  testing.  Fact Sheet for Patients: https://www.moore.com/  Fact Sheet for Healthcare Providers: https://www.young.biz/  This test is not yet approved or cleared by the [place] FDA and  has been authorized for detection and/or diagnosis of SARS-CoV-2 by  FDA under an Emergency Use Authorization (EUA). This EUA will remain  in effect (meaning this test can be used) for the duration of the  Covid-19 declaration under Section 564(b)(1) of the Act, 21  U.S.C. section 360bbb-3(b)(1), unless the authorization is  terminated or revoked. Performed at [facility], [address] [place], [place] [address]   MRSA PCR Screening     Status: None   Collection Time: 01/23/20  5:01 PM   Specimen: Nasal Mucosa;  Nasopharyngeal  Result Value Ref Range Status   MRSA by PCR NEGATIVE NEGATIVE Final    Comment:        The GeneXpert MRSA Assay (FDA approved for NASAL specimens only), is one component of a comprehensive MRSA colonization surveillance program. It is not intended to diagnose MRSA infection nor to guide or monitor treatment for MRSA infections. Performed at [facility], [address] [place], [place] [address]     Coagulation Studies: No results for input(s): LABPROT, INR in the last 72 hours.  Urinalysis: No results for input(s):  COLORURINE, LABSPEC, PHURINE, GLUCOSEU, HGBUR, BILIRUBINUR, KETONESUR, PROTEINUR, UROBILINOGEN, NITRITE, LEUKOCYTESUR in the last 72 hours.  Invalid input(s): APPERANCEUR    Imaging: DG Chest Port 1 View  Result Date: 01/25/2020 CLINICAL DATA:  Lung abnormality on chest x-ray EXAM: PORTABLE CHEST 1 VIEW COMPARISON:  01/20/2020 FINDINGS: Shallow inspiration. Mild cardiac enlargement. Patchy bilateral perihilar infiltrates suggesting multifocal pneumonia or edema. Mild progression since prior study. No pleural effusions. No pneumothorax. Mediastinal contours appear intact. IMPRESSION: Patchy bilateral perihilar infiltrates suggesting multifocal pneumonia or edema. Electronically Signed   By: Burman Nieves M.D.   On: 01/25/2020 04:53     Medications:   . sodium chloride Stopped (01/25/20 1535)   . arformoterol  15 mcg Nebulization BID  . atorvastatin  10 mg Oral Daily  . budesonide (PULMICORT) nebulizer solution  0.25 mg Nebulization BID  . busPIRone  5 mg Oral QHS  . Chlorhexidine Gluconate Cloth  6 each Topical Daily  . enoxaparin (LOVENOX) injection  40 mg Subcutaneous Q12H  . escitalopram  10 mg Oral Daily  . fluticasone  2 spray Each Nare Daily  . insulin aspart  0-9 Units Subcutaneous TID PC & HS  . lactulose  20 g Oral BID  . methylPREDNISolone (SOLU-MEDROL) injection  40 mg Intravenous Q12H  . midodrine  5 mg Oral TID WC   . montelukast  10 mg Oral QHS  . nicotine  21 mg Transdermal Q0600  . oxyCODONE  20 mg Oral Q12H  . pantoprazole  40 mg Oral BID  . sodium chloride flush  3 mL Intravenous Q12H  . triamcinolone ointment  1 application Topical BID   sodium chloride, acetaminophen, albuterol, ALPRAZolam, diclofenac Sodium, fentaNYL (SUBLIMAZE) injection, hydrOXYzine, lidocaine, naloxone, ondansetron (ZOFRAN) IV, sodium chloride, sodium chloride flush  Assessment/ Plan:  Ms. [name] is a 54 y.o.  female with  T2 DM, HTN, OSA, morbid obesity, Hep C, chronic lung diseae was admitted on 01/21/2020 with Hypoxia [R09.02] Acute CHF (congestive heart failure) (HCC) [I50.9] Congestive heart failure, unspecified HF chronicity, unspecified heart failure type (HCC) [I50.9] Acute on chronic diastolic CHF (congestive heart failure) (HCC) [I50.33]   # AKI #Volume Overload Baseline Cr 0.73 on 01/20/2020 [place] up to 2.46 on 01/24/20 Creatinine today is 0.83, close to baseline  #Hypotension Blood Pressure readings within low normal range  #Hypokalemia K+ normalized,  4.8 today   Nephrology signing off    LOS: 4 Justyce Yeater 9/29/20212:56 PM  Patient was seen and examined with Denna Haggard, NP, DNP. Plan of care discussed. I agree with the note.

## 2020-01-28 ENCOUNTER — Ambulatory Visit: Payer: Medicaid Other | Admitting: Podiatry

## 2020-01-29 NOTE — Discharge Summary (Signed)
[facility] NAME: [name]    MR#:  [id number]  DATE OF BIRTH:  1965-06-05  DATE OF ADMISSION:  01/21/2020   ADMITTING PHYSICIAN: Ezekiel Slocumb, DO  DATE OF DISCHARGE: 01/26/2020  1:03 PM  PRIMARY CARE PHYSICIAN: Ellamae Sia, MD   ADMISSION DIAGNOSIS:  Hypoxia [R09.02] Acute CHF (congestive heart failure) (HCC) [I50.9] Congestive heart failure, unspecified HF chronicity, unspecified heart failure type (HCC) [I50.9] Acute on chronic diastolic CHF (congestive heart failure) ([place]) [I50.33] DISCHARGE DIAGNOSIS:  Principal Problem:   Acute on chronic diastolic CHF (congestive heart failure) (HCC) Active Problems:   Essential hypertension   Hepatitis C, chronic (HCC)   ILD (interstitial lung disease) (HCC)   Obesity, Class III, BMI 40-49.9 (morbid obesity) (HCC)   Congestive heart failure (HCC)   Obstructive sleep apnea   Hypokalemia   COPD (chronic obstructive pulmonary disease) (HCC)   Dyslipidemia   Depression with anxiety   Hypoxia   AKI (acute kidney injury) ([place])  SECONDARY DIAGNOSIS:   Past Medical History:  Diagnosis Date  . Asthma   . COPD (chronic obstructive pulmonary disease) ([place])   . Diabetes mellitus without complication ([place])   . Diastolic dysfunction    a. 03/2018 Echo [facility]): EF >55%, Gr1 DD. Nl RV fxn.  . Esophageal varices ([place])    a. 10/2019 EGD: gr III esoph varices s/p banding.  . Essential hypertension   . GI bleed   . HCV (hepatitis C virus)   . Hematemesis    a. 10/2019 EGD: Gr III esoph varices, incompletely eradicated, banded. Non-bleeding ulcers tx w/ argon plasma coag. Single non-bleeding angiodysplastic lesion in duodenum tx w/ APC.  [name] Hepatic cirrhosis (HCC)    a. h/o ETOH/Hep C.  . Hypertension   . Interstitial lung disease ([place])    a. Followed @ [facility] pulm. Prev on cellcept (caused GI side effects), Imuran (GI se), myfortic (elev LFTs). More recently on rituximab.  . Mixed  hyperlipidemia   . Morbid obesity ([place])   . O2 dependent   . PTSD (post-traumatic stress disorder)   . Sleep apnea   . Tobacco abuse    HOSPITAL COURSE:  54 year old female with history of interstitial lung disease, COPD, 2 L/min home oxygen, dCHF, severe OSA, type 2 diabetes mellitus, hypertension, sleep apnea and hepatitis C, morbid obesity admitted for acute on chronic hypoxic respiratory failure due to volume overload.  Acute on chronic respiratory failure with hypoxia - uses 2 L/min at baseline.  On presentation needing 4 L/min in the setting of volume overload. present on admission  -Initially required BiPAP, weaned off to nasal cannula oxygen.-Now set up to receive home NIV / Trilogy  Volume Overoad - Improved.  Present on admission with hypoxia, SOB/DOE, edema, PND, mildly elevated BNP all clinically consistent with CHF but findings from Echo this admission point to pulmonary etiology vs third-spacing given her liver disease.  Echo 9/25 - report EF 60 to 19%, grade 1 diastolic dysfunction. Echo reviewed by Dr. Harrington Challenger: diastolic parameters indeterminate but both atria normal in size which goes against significant diastolic dysfunction, no evidence for pulmonary hypertension.  Diuresed with Lasix while in the hospital. --Recommend to avoid beta-blockers due to frequent wheezing (COPD, ILD)  Hypotension - Improved, was briefly on pressors in ICU, overnight 9/25-26, BP 80's over 50's and low of 75/47.  Likely due to aggressive diuresis and ?third spacing.  Acute Kidney Injury - not  POA, secondary to diuresis. Renal ultrasound negative for obstruction.  Resolved with hydration.  Hypokalemia: Replaced  Hepatitis C, chronic / hepatic cirrhosis /esophageal varices / Hepatic encephalopathy   COPD Interstitial lung disease - chronic respiratory issues appear stable at this time  Dyslipidemia - continue Lipitor  Depression with anxiety -continue BuSpar, Lexapro, Vistaril as  needed  Chronic pain - continue home OxyContin, Voltaren gel, lidocaine ointment  Morbid obesity: Body mass index is 47.5 kg/m.  Complicates overall care and prognosis including her respiratory status with sleep apnea and probable obesity hypoventilation.  Patient counseled on lifestyle modifications including weight loss and physical activity and the importance of weight loss for her overall health.   DISCHARGE CONDITIONS:  Stable CONSULTS OBTAINED:   DRUG ALLERGIES:   Allergies  Allergen Reactions  . Other Other (See Comments)    Gel capsules causes GI problems  . Mycophenolate Sodium Other (See Comments)    transaminitis  . Azathioprine Nausea Only  . Mycophenolate Mofetil Nausea Only  . Tacrolimus Diarrhea and Nausea Only   DISCHARGE MEDICATIONS:   Allergies as of 01/26/2020      Reactions   Other Other (See Comments)   Gel capsules causes GI problems   Mycophenolate Sodium Other (See Comments)   transaminitis   Azathioprine Nausea Only   Mycophenolate Mofetil Nausea Only   Tacrolimus Diarrhea, Nausea Only      Medication List    STOP taking these medications   Advair Diskus 250-50 MCG/DOSE Aepb Generic drug: Fluticasone-Salmeterol     TAKE these medications   albuterol 108 (90 Base) MCG/ACT inhaler Commonly known as: VENTOLIN HFA Inhale 1-2 puffs into the lungs every 6 (six) hours as needed for wheezing or shortness of breath.   albuterol (2.5 MG/3ML) 0.083% nebulizer solution Commonly known as: PROVENTIL Take 2.5 mg by nebulization every 4 (four) hours as needed.   atorvastatin 10 MG tablet Commonly known as: LIPITOR Take 10 mg by mouth daily.   budesonide-formoterol 160-4.5 MCG/ACT inhaler Commonly known as: SYMBICORT Inhale 2 puffs into the lungs daily.   busPIRone 5 MG tablet Commonly known as: BUSPAR Take 5 mg by mouth at bedtime.   diclofenac Sodium 1 % Gel Commonly known as: VOLTAREN Apply 1 application topically every 6 (six) hours as  needed.   Enulose 10 GM/15ML Soln Generic drug: lactulose (encephalopathy) Take 20 g by mouth 2 (two) times daily.   escitalopram 10 MG tablet Commonly known as: LEXAPRO Take 10 mg by mouth daily.   fluticasone 50 MCG/ACT nasal spray Commonly known as: FLONASE Place 2 sprays into both nostrils daily.   furosemide 20 MG tablet Commonly known as: LASIX Take 20 mg by mouth daily.   hydrOXYzine 10 MG tablet Commonly known as: ATARAX/VISTARIL Take 10 mg by mouth 3 (three) times daily as needed.   insulin detemir 100 UNIT/ML injection Commonly known as: LEVEMIR Inject 10-20 Units into the skin 2 (two) times daily. 20 units in morning (may add additional 20 units in evening depending on blood sugar)   lidocaine 5 % ointment Commonly known as: XYLOCAINE APPLY 1 APPLICATION TOPICALLY TWO (2) TIMES A DAY. APPLY 5 GRAMS TO AREA UP TO TWICE DAILY   losartan 100 MG tablet Commonly known as: COZAAR Take 100 mg by mouth daily.   montelukast 10 MG tablet Commonly known as: SINGULAIR Take 10 mg by mouth at bedtime.   Narcan 4 MG/0.1ML Liqd nasal spray kit Generic drug: naloxone PLEASE SEE ATTACHED FOR DETAILED DIRECTIONS   nicotine  21 mg/24hr patch Commonly known as: NICODERM CQ - dosed in mg/24 hours as directed.   omeprazole 20 MG capsule Commonly known as: PRILOSEC Take 20 mg by mouth 2 (two) times daily.   oxyCODONE 20 mg 12 hr tablet Commonly known as: OXYCONTIN Take 20 mg by mouth every 12 (twelve) hours.   pantoprazole 40 MG tablet Commonly known as: Protonix Take 1 tablet (40 mg total) by mouth 2 (two) times daily.   Spiriva Respimat 2.5 MCG/ACT Aers Generic drug: Tiotropium Bromide Monohydrate Inhale 2 puffs into the lungs daily in the afternoon.   triamcinolone ointment 0.5 % Commonly known as: KENALOG Apply 1 application topically 2 (two) times daily.      DISCHARGE INSTRUCTIONS:   DIET:  Cardiac diet DISCHARGE CONDITION:  Stable ACTIVITY:   Activity as tolerated OXYGEN:  Home Oxygen: No.  Oxygen Delivery: room air DISCHARGE LOCATION:  home   If you experience worsening of your admission symptoms, develop shortness of breath, life threatening emergency, suicidal or homicidal thoughts you must seek medical attention immediately by calling 911 or calling your MD immediately  if symptoms less severe.  You Must read complete instructions/literature along with all the possible adverse reactions/side effects for all the Medicines you take and that have been prescribed to you. Take any new Medicines after you have completely understood and accpet all the possible adverse reactions/side effects.   Please note  You were cared for by a hospitalist during your hospital stay. If you have any questions about your discharge medications or the care you received while you were in the hospital after you are discharged, you can call the unit and asked to speak with the hospitalist on call if the hospitalist that took care of you is not available. Once you are discharged, your primary care physician will handle any further medical issues. Please note that NO REFILLS for any discharge medications will be authorized once you are discharged, as it is imperative that you return to your primary care physician (or establish a relationship with a primary care physician if you do not have one) for your aftercare needs so that they can reassess your need for medications and monitor your lab values.    On the day of Discharge:  VITAL SIGNS:  Blood pressure 133/77, pulse 64, temperature 98.7 F (37.1 C), temperature source Oral, resp. rate 18, height _0  (1.575 m), weight 119.1 kg, last menstrual period 11/03/2009, SpO2 95 %. PHYSICAL EXAMINATION:  GENERAL:  54 y.o.-year-old patient lying in the bed with no acute distress.  EYES: Pupils equal, round, reactive to light and accommodation. No scleral icterus. Extraocular muscles intact.  HEENT: Head  atraumatic, normocephalic. Oropharynx and nasopharynx clear.  NECK:  Supple, no jugular venous distention. No thyroid enlargement, no tenderness.  LUNGS: Normal breath sounds bilaterally, no wheezing, rales,rhonchi or crepitation. No use of accessory muscles of respiration.  CARDIOVASCULAR: S1, S2 normal. No murmurs, rubs, or gallops.  ABDOMEN: Soft, non-tender, non-distended. Bowel sounds present. No organomegaly or mass.  EXTREMITIES: No pedal edema, cyanosis, or clubbing.  NEUROLOGIC: Cranial nerves II through XII are intact. Muscle strength 5/5 in all extremities. Sensation intact. Gait not checked.  PSYCHIATRIC: The patient is alert and oriented x 3.  SKIN: No obvious rash, lesion, or ulcer.  DATA REVIEW:   CBC Recent Labs  Lab 01/26/20 0401  WBC 9.2  HGB 11.4*  HCT 38.5  PLT 113*    Chemistries  Recent Labs  Lab 01/23/20 0425 01/23/20 1459 01/24/20  0347 01/25/20 0410 01/26/20 0401  NA   < >  --  138   < > 139  K   < >  --  4.7   < > 4.8  CL   < >  --  100   < > 100  CO2   < >  --  29   < > 30  GLUCOSE   < >  --  126*   < > 130*  BUN   < >  --  31*   < > 33*  CREATININE   < >  --  2.46*   < > 0.83  CALCIUM   < >  --  8.3*   < > 8.9  MG  --   --  2.0  --   --   AST  --  20  --   --   --   ALT  --  22  --   --   --   ALKPHOS  --  47  --   --   --   BILITOT  --  0.9  --   --   --    < > = values in this interval not displayed.     Outpatient follow-up  Follow-up Information    [facility] Follow up on 02/02/2020.   Specialty: Cardiology Why: at 11:00am. Enter through the [place] entrance Contact information: [place] [address] [place] [phone]       Ellamae Sia, MD. Schedule an appointment as soon as possible for a visit on 02/02/2020.   Specialty: Internal Medicine Why: @ 9:20am Contact information: [place]  [address] [phone]        Alphia Kava, MD. Schedule an appointment as soon as possible for a visit on 02/18/2020.   Specialty: Pulmonary Disease Why: @ 1:30pm Contact information: [place] [address] [phone]                Management plans discussed with the patient, family and they are in agreement.  CODE STATUS: Prior   TOTAL TIME TAKING CARE OF THIS PATIENT: 45 minutes.    Max Sane M.D on 01/29/2020 at 5:32 PM  [facility]   CC: Primary care physician; Ellamae Sia, MD   Note: This dictation was prepared with Dragon dictation along with smaller phrase technology. Any transcriptional errors that result from this process are unintentional.

## 2020-02-02 ENCOUNTER — Ambulatory Visit: Payer: Medicaid Other | Admitting: Family

## 2020-02-02 ENCOUNTER — Telehealth: Payer: Self-pay | Admitting: Family

## 2020-02-02 NOTE — Telephone Encounter (Signed)
Patient did not show for her [facility] appointment on 02/02/20. Will attempt to reschedule.

## 2020-02-12 NOTE — Progress Notes (Signed)
Patient ID: [name], female    DOB: [birth year], 53 y.o.   MRN: [id number]  HPI  [name] is a 54 y/o female with a history of asthma, DM, hyperlipidemia, HTN, sleep apnea, ILD, hepatic cirrhosis, COPD, PTSD, current tobacco use and chronic heart failure.   Echo report from 01/22/20 reviewed and showed an EF of 60-65%.  Admitted 01/21/20 due to acute on chronic HF. Initially needed bipap & then weaned to her nasal cannula. Set up for NIV/trilogy at bedtime upon discharge. Renal ultrasound negative. Cardiology and nephrology consults obtained. Discharged after 5 days.   She presents today for her initial visit with a chief complaint of moderate shortness of breath upon minimal exertion. She describes this as chronic in nature having been present for several years. She has associated fatigue, head congestion, cough, wheezing (although improving), light-headedness and chronic difficulty sleeping along with this. She denies any abdominal distention, palpitations, pedal edema or chest pain.   Does not have scales at home.   Past Medical History:  Diagnosis Date  . Asthma   . CHF (congestive heart failure) ([place])   . COPD (chronic obstructive pulmonary disease) ([place])   . Diabetes mellitus without complication ([place])   . Diastolic dysfunction    a. 03/2018 Echo [facility]): EF >55%, Gr1 DD. Nl RV fxn.  . Esophageal varices ([place])    a. 10/2019 EGD: gr III esoph varices s/p banding.  . Essential hypertension   . GI bleed   . HCV (hepatitis C virus)   . Hematemesis    a. 10/2019 EGD: Gr III esoph varices, incompletely eradicated, banded. Non-bleeding ulcers tx w/ argon plasma coag. Single non-bleeding angiodysplastic lesion in duodenum tx w/ APC.  [name] Hepatic cirrhosis (HCC)    a. h/o ETOH/Hep C.  . Hypertension   . Interstitial lung disease ([place])    a. Followed @ [facility] pulm. Prev on cellcept (caused GI side effects), Imuran (GI se), myfortic (elev LFTs). More recently on rituximab.  . Mixed  hyperlipidemia   . Morbid obesity ([place])   . O2 dependent   . PTSD (post-traumatic stress disorder)   . Sleep apnea   . Tobacco abuse    Past Surgical History:  Procedure Laterality Date  . CHOLECYSTECTOMY    . ESOPHAGOGASTRODUODENOSCOPY (EGD) WITH PROPOFOL N/A 11/04/2019   Procedure: ESOPHAGOGASTRODUODENOSCOPY (EGD) WITH PROPOFOL;  Surgeon: Lucilla Lame, MD;  Location: [facility] ENDOSCOPY;  Service: Endoscopy;  Laterality: N/A;  . ORIF TIBIA PLATEAU Left 07/02/2015   Procedure: OPEN REDUCTION INTERNAL FIXATION (ORIF) TIBIAL PLATEAU;  Surgeon: Thornton Park, MD;  Location: [facility] ORS;  Service: Orthopedics;  Laterality: Left;   Family History  Problem Relation Age of Onset  . Heart failure Mother    Social History   Tobacco Use  . Smoking status: Current Every Day Smoker    Packs/day: 0.25    Years: 36.00    Pack years: 9.00    Types: Cigarettes  . Smokeless tobacco: Never Used  Substance Use Topics  . Alcohol use: Yes    Alcohol/week: 0.0 standard drinks    Comment: 2-3 drinks/wk. Prev heavy drinker.   Allergies  Allergen Reactions  . Other Other (See Comments)    Gel capsules causes GI problems  . Mycophenolate Sodium Other (See Comments)    transaminitis  . Azathioprine Nausea Only  . Mycophenolate Mofetil Nausea Only  . Tacrolimus Diarrhea and Nausea Only   Prior to Admission medications   Medication Sig Start Date End Date Taking? Authorizing  Provider  albuterol (PROVENTIL HFA;VENTOLIN HFA) 108 (90 Base) MCG/ACT inhaler Inhale 1-2 puffs into the lungs every 6 (six) hours as needed for wheezing or shortness of breath.  03/10/13  Yes [provider]  albuterol (PROVENTIL) (2.5 MG/3ML) 0.083% nebulizer solution Take 2.5 mg by nebulization every 4 (four) hours as needed. 01/03/20  Yes [provider]  atorvastatin (LIPITOR) 10 MG tablet Take 10 mg by mouth daily.  04/24/18  Yes [provider]  budesonide-formoterol (SYMBICORT) 160-4.5 MCG/ACT inhaler  Inhale 2 puffs into the lungs daily.  07/02/19 07/01/20 Yes [provider]  busPIRone (BUSPAR) 5 MG tablet Take 5 mg by mouth at bedtime. 09/10/19  Yes [provider]  cetirizine (ZYRTEC ALLERGY) 10 MG tablet Take 10 mg by mouth daily.   Yes [provider]  diclofenac Sodium (VOLTAREN) 1 % GEL Apply 1 application topically every 6 (six) hours as needed. 07/06/19  Yes [provider]  ENULOSE 10 GM/15ML SOLN Take 20 g by mouth 2 (two) times daily.  12/20/19  Yes [provider]  escitalopram (LEXAPRO) 10 MG tablet Take 10 mg by mouth daily. 01/06/20  Yes [provider]  fluticasone (FLONASE) 50 MCG/ACT nasal spray Place 2 sprays into both nostrils daily.   Yes [provider]  furosemide (LASIX) 20 MG tablet Take 40 mg by mouth daily.  08/14/16  Yes [provider]  hydrOXYzine (ATARAX/VISTARIL) 10 MG tablet Take 10 mg by mouth 3 (three) times daily as needed. 12/10/19  Yes [provider]  insulin detemir (LEVEMIR) 100 UNIT/ML injection Inject 10-20 Units into the skin 2 (two) times daily. 20 units in morning (may add additional 20 units in evening depending on blood sugar)   Yes [provider]  losartan (COZAAR) 100 MG tablet Take 100 mg by mouth daily. 07/27/19  Yes [provider]  montelukast (SINGULAIR) 10 MG tablet Take 10 mg by mouth at bedtime.  11/19/19 11/18/20 Yes [provider]  pantoprazole (PROTONIX) 40 MG tablet Take 1 tablet (40 mg total) by mouth 2 (two) times daily. 11/08/19 02/14/20 Yes Harold Hedge, MD  SPIRIVA RESPIMAT 2.5 MCG/ACT AERS Inhale 2 puffs into the lungs daily in the afternoon. 07/22/19  Yes [provider]  spironolactone (ALDACTONE) 100 MG tablet Take 100 mg by mouth daily.   Yes [provider]  triamcinolone ointment (KENALOG) 0.5 % Apply 1 application topically 2 (two) times daily. 08/23/19  Yes [provider]  lidocaine (XYLOCAINE) 5 %  ointment APPLY 1 APPLICATION TOPICALLY TWO (2) TIMES A DAY. APPLY 5 GRAMS TO AREA UP TO TWICE DAILY Patient not taking: Reported on 02/14/2020 08/16/16   [provider]  naloxone (NARCAN) 4 MG/0.1ML LIQD nasal spray kit PLEASE SEE ATTACHED FOR DETAILED DIRECTIONS Patient not taking: Reported on 02/14/2020 08/18/18   [provider]  oxyCODONE (OXYCONTIN) 20 mg 12 hr tablet Take 20 mg by mouth every 12 (twelve) hours. Patient not taking: Reported on 02/14/2020    [provider]    Review of Systems  Constitutional: Positive for fatigue (minimal). Negative for appetite change.  HENT: Positive for congestion and postnasal drip. Negative for sore throat.   Eyes: Negative.   Respiratory: Positive for cough, shortness of breath (with little exertion) and wheezing (better).   Cardiovascular: Negative for chest pain, palpitations and leg swelling.  Gastrointestinal: Negative for abdominal distention and abdominal pain.  Endocrine: Negative.   Genitourinary: Negative.   Musculoskeletal: Positive for arthralgias (left foot). Negative  for back pain.  Skin: Negative.   Allergic/Immunologic: Negative.   Neurological: Positive for light-headedness. Negative for dizziness.  Hematological: Negative for adenopathy. Does not bruise/bleed easily.  Psychiatric/Behavioral: Positive for sleep disturbance (sleeping on 3 pillows; wearing oxygen @ 2L). Negative for dysphoric mood. The patient is not nervous/anxious.    Vitals:   02/14/20 1308  BP: 118/76  Pulse: 77  Resp: 20  SpO2: 93%  Weight: 242 lb (109.8 kg)  Height: '5\' 3"'  (1.6 m)   Wt Readings from Last 3 Encounters:  02/14/20 242 lb (109.8 kg)  01/26/20 262 lb 9.6 oz (119.1 kg)  11/04/19 242 lb (109.8 kg)   Lab Results  Component Value Date   CREATININE 0.83 01/26/2020   CREATININE 1.31 (H) 01/25/2020   CREATININE 2.46 (H) 01/24/2020   Physical Exam Vitals and nursing note reviewed. Exam conducted with a chaperone  present (brother in the room).  Constitutional:      Appearance: Normal appearance.  HENT:     Head: Normocephalic and atraumatic.  Cardiovascular:     Rate and Rhythm: Normal rate and regular rhythm.  Pulmonary:     Effort: Pulmonary effort is normal. No respiratory distress.     Breath sounds: No wheezing or rales.  Abdominal:     General: There is no distension.     Palpations: Abdomen is soft.     Tenderness: There is no abdominal tenderness.  Musculoskeletal:     Cervical back: Normal range of motion and neck supple.     Right lower leg: No edema.     Left lower leg: No edema.  Skin:    General: Skin is warm and dry.  Neurological:     General: No focal deficit present.     Mental Status: She is alert and oriented to person, place, and time.  Psychiatric:        Mood and Affect: Mood normal.        Behavior: Behavior normal.        Thought Content: Thought content normal.    Assessment & Plan:  1: Chronic heart failure with preseved ejection fraction without structural changes- - NYHA class III - euvolemic today - scales given and she was instructed to weigh every morning after using the bathroom, write the weight down and call for an overnight weight gain of > 2 pounds or a weekly weight gain of >5 pounds - not adding salt and the importance of following a low sodium diet was discussed with her - BNP 01/21/20 was 111.6 - reports receiving both covid vaccines, covid booster and her flu vaccine for this season  2: HTN- - BP looks good today - sees PCP ([facility]) in ~ 2 weeks - BMP 01/26/20 reviewed and showed sodium 139, potassium 4.8, creatinine 0.83 and GFR >60  3: DM- - A1c 01/22/20 was 6.9%  4: ILD- - saw pulmonology ([facility]) 11/19/19 & returns on 02/18/20 - smokes 1/2 ppd of cigarettes - wears oxygen at 2L at bedtime and during the day "sometimes"   Medication bottles reviewed.   Return in 6 weeks or sooner for any questions/problems before then.

## 2020-02-14 ENCOUNTER — Ambulatory Visit: Payer: Medicaid Other | Attending: Family | Admitting: Family

## 2020-02-14 ENCOUNTER — Other Ambulatory Visit: Payer: Self-pay

## 2020-02-14 ENCOUNTER — Encounter: Payer: Self-pay | Admitting: Family

## 2020-02-14 VITALS — BP 118/76 | HR 77 | Resp 20 | Ht 63.0 in | Wt 242.0 lb

## 2020-02-14 DIAGNOSIS — Z8249 Family history of ischemic heart disease and other diseases of the circulatory system: Secondary | ICD-10-CM | POA: Insufficient documentation

## 2020-02-14 DIAGNOSIS — I110 Hypertensive heart disease with heart failure: Secondary | ICD-10-CM | POA: Insufficient documentation

## 2020-02-14 DIAGNOSIS — E119 Type 2 diabetes mellitus without complications: Secondary | ICD-10-CM

## 2020-02-14 DIAGNOSIS — Z794 Long term (current) use of insulin: Secondary | ICD-10-CM | POA: Insufficient documentation

## 2020-02-14 DIAGNOSIS — Z9981 Dependence on supplemental oxygen: Secondary | ICD-10-CM | POA: Insufficient documentation

## 2020-02-14 DIAGNOSIS — Z888 Allergy status to other drugs, medicaments and biological substances status: Secondary | ICD-10-CM | POA: Diagnosis not present

## 2020-02-14 DIAGNOSIS — Z6841 Body Mass Index (BMI) 40.0 and over, adult: Secondary | ICD-10-CM | POA: Insufficient documentation

## 2020-02-14 DIAGNOSIS — G4730 Sleep apnea, unspecified: Secondary | ICD-10-CM | POA: Diagnosis not present

## 2020-02-14 DIAGNOSIS — J849 Interstitial pulmonary disease, unspecified: Secondary | ICD-10-CM | POA: Diagnosis not present

## 2020-02-14 DIAGNOSIS — Z79899 Other long term (current) drug therapy: Secondary | ICD-10-CM | POA: Diagnosis not present

## 2020-02-14 DIAGNOSIS — Z7951 Long term (current) use of inhaled steroids: Secondary | ICD-10-CM | POA: Diagnosis not present

## 2020-02-14 DIAGNOSIS — K7460 Unspecified cirrhosis of liver: Secondary | ICD-10-CM | POA: Diagnosis not present

## 2020-02-14 DIAGNOSIS — I509 Heart failure, unspecified: Secondary | ICD-10-CM | POA: Diagnosis not present

## 2020-02-14 DIAGNOSIS — J449 Chronic obstructive pulmonary disease, unspecified: Secondary | ICD-10-CM | POA: Insufficient documentation

## 2020-02-14 DIAGNOSIS — I10 Essential (primary) hypertension: Secondary | ICD-10-CM

## 2020-02-14 DIAGNOSIS — I5032 Chronic diastolic (congestive) heart failure: Secondary | ICD-10-CM

## 2020-02-14 DIAGNOSIS — F17210 Nicotine dependence, cigarettes, uncomplicated: Secondary | ICD-10-CM | POA: Diagnosis not present

## 2020-02-14 DIAGNOSIS — E782 Mixed hyperlipidemia: Secondary | ICD-10-CM | POA: Insufficient documentation

## 2020-02-14 DIAGNOSIS — Z9049 Acquired absence of other specified parts of digestive tract: Secondary | ICD-10-CM | POA: Diagnosis not present

## 2020-02-14 NOTE — Patient Instructions (Addendum)
Begin weighing daily and call for an overnight weight gain of > 2 pounds or a weekly weight gain of >5 pounds. 

## 2020-02-15 ENCOUNTER — Encounter: Payer: Self-pay | Admitting: Family

## 2020-03-27 ENCOUNTER — Other Ambulatory Visit: Payer: Self-pay

## 2020-03-27 ENCOUNTER — Encounter: Payer: Self-pay | Admitting: Family

## 2020-03-27 ENCOUNTER — Ambulatory Visit: Payer: Medicaid Other | Attending: Family | Admitting: Family

## 2020-03-27 VITALS — BP 120/74 | HR 84 | Resp 18 | Ht 63.0 in | Wt 260.4 lb

## 2020-03-27 DIAGNOSIS — F17210 Nicotine dependence, cigarettes, uncomplicated: Secondary | ICD-10-CM | POA: Insufficient documentation

## 2020-03-27 DIAGNOSIS — J849 Interstitial pulmonary disease, unspecified: Secondary | ICD-10-CM | POA: Diagnosis not present

## 2020-03-27 DIAGNOSIS — Z794 Long term (current) use of insulin: Secondary | ICD-10-CM | POA: Insufficient documentation

## 2020-03-27 DIAGNOSIS — Z7901 Long term (current) use of anticoagulants: Secondary | ICD-10-CM | POA: Diagnosis not present

## 2020-03-27 DIAGNOSIS — K7460 Unspecified cirrhosis of liver: Secondary | ICD-10-CM | POA: Diagnosis not present

## 2020-03-27 DIAGNOSIS — I509 Heart failure, unspecified: Secondary | ICD-10-CM | POA: Insufficient documentation

## 2020-03-27 DIAGNOSIS — E782 Mixed hyperlipidemia: Secondary | ICD-10-CM | POA: Insufficient documentation

## 2020-03-27 DIAGNOSIS — J449 Chronic obstructive pulmonary disease, unspecified: Secondary | ICD-10-CM | POA: Diagnosis not present

## 2020-03-27 DIAGNOSIS — G8929 Other chronic pain: Secondary | ICD-10-CM | POA: Diagnosis not present

## 2020-03-27 DIAGNOSIS — Z9981 Dependence on supplemental oxygen: Secondary | ICD-10-CM | POA: Insufficient documentation

## 2020-03-27 DIAGNOSIS — I5032 Chronic diastolic (congestive) heart failure: Secondary | ICD-10-CM

## 2020-03-27 DIAGNOSIS — I110 Hypertensive heart disease with heart failure: Secondary | ICD-10-CM | POA: Diagnosis not present

## 2020-03-27 DIAGNOSIS — R0602 Shortness of breath: Secondary | ICD-10-CM | POA: Insufficient documentation

## 2020-03-27 DIAGNOSIS — Z79899 Other long term (current) drug therapy: Secondary | ICD-10-CM | POA: Insufficient documentation

## 2020-03-27 DIAGNOSIS — E119 Type 2 diabetes mellitus without complications: Secondary | ICD-10-CM | POA: Diagnosis not present

## 2020-03-27 DIAGNOSIS — R42 Dizziness and giddiness: Secondary | ICD-10-CM | POA: Diagnosis not present

## 2020-03-27 DIAGNOSIS — I10 Essential (primary) hypertension: Secondary | ICD-10-CM

## 2020-03-27 MED ORDER — METOLAZONE 2.5 MG PO TABS: 2.5000 mg | ORAL_TABLET | Freq: Every day | ORAL | 0 refills | Status: DC

## 2020-03-27 MED ORDER — POTASSIUM CHLORIDE CRYS ER 20 MEQ PO TBCR: 20.0000 meq | EXTENDED_RELEASE_TABLET | Freq: Every day | ORAL | 0 refills | Status: DC

## 2020-03-27 NOTE — Progress Notes (Signed)
Patient ID: [name], female    DOB: 1966/04/17, 54 y.o.   MRN: [id number]  HPI  [name] is a 54 y/o female with a history of asthma, DM, hyperlipidemia, HTN, sleep apnea, ILD, hepatic cirrhosis, COPD, PTSD, current tobacco use and chronic heart failure.   Echo report from 01/22/20 reviewed and showed an EF of 60-65%.  Admitted 01/21/20 due to acute on chronic HF. Initially needed bipap & then weaned to her nasal cannula. Set up for NIV/trilogy at bedtime upon discharge. Renal ultrasound negative. Cardiology and nephrology consults obtained. Discharged after 5 days.   [name] presents today for a follow-up visit with a chief complaint of moderate shortness of breath upon minimal exertion. [name] describes this as chronic in nature although [name] does feel like her breathing has worsened with the weather changes. [name] has associated fatigue, head congestion, cough, wheezing, light-headedness, difficulty sleeping, weight gain and chronic pain along with this. [name] denies any abdominal swelling, palpitations or chest pain.   Diuretic was increased for a few days before Thanksgiving but [name] doesn't think it has helped. Says that her PCP gave her a booster pill once which helped.   Pain management options given to patient in [place]. [name] said [name] has to check with them and the pain clinic here in [place] to see who takes her insurance.   Past Medical History:  Diagnosis Date   Asthma    CHF (congestive heart failure) (HCC)    COPD (chronic obstructive pulmonary disease) ([place])    Diabetes mellitus without complication ([place])    Diastolic dysfunction    a. 03/2018 Echo [facility]): EF >55%, Gr1 DD. Nl RV fxn.   Esophageal varices ([place])    a. 10/2019 EGD: gr III esoph varices s/p banding.   Essential hypertension    GI bleed    HCV (hepatitis C virus)    Hematemesis    a. 10/2019 EGD: Gr III esoph varices, incompletely eradicated, banded. Non-bleeding ulcers tx w/ argon plasma coag.  Single non-bleeding angiodysplastic lesion in duodenum tx w/ APC.   Hepatic cirrhosis (HCC)    a. h/o ETOH/Hep C.   Hypertension    Interstitial lung disease ([place])    a. Followed @ [facility] pulm. Prev on cellcept (caused GI side effects), Imuran (GI se), myfortic (elev LFTs). More recently on rituximab.   Mixed hyperlipidemia    Morbid obesity (HCC)    O2 dependent    PTSD (post-traumatic stress disorder)    Sleep apnea    Tobacco abuse    Past Surgical History:  Procedure Laterality Date   CHOLECYSTECTOMY     ESOPHAGOGASTRODUODENOSCOPY (EGD) WITH PROPOFOL N/A 11/04/2019   Procedure: ESOPHAGOGASTRODUODENOSCOPY (EGD) WITH PROPOFOL;  Surgeon: Lucilla Lame, MD;  Location: [facility] ENDOSCOPY;  Service: Endoscopy;  Laterality: N/A;   ORIF TIBIA PLATEAU Left 07/02/2015   Procedure: OPEN REDUCTION INTERNAL FIXATION (ORIF) TIBIAL PLATEAU;  Surgeon: Thornton Park, MD;  Location: [facility] ORS;  Service: Orthopedics;  Laterality: Left;   Family History  Problem Relation Age of Onset   Heart failure Mother    Social History   Tobacco Use   Smoking status: Current Every Day Smoker    Packs/day: 0.25    Years: 36.00    Pack years: 9.00    Types: Cigarettes   Smokeless tobacco: Never Used  Substance Use Topics   Alcohol use: Yes    Alcohol/week: 0.0 standard drinks    Comment: 2-3 drinks/wk. Prev heavy drinker.   Allergies  Allergen  Reactions   Other Other (See Comments)    Gel capsules causes GI problems   Mycophenolate Sodium Other (See Comments)    transaminitis   Azathioprine Nausea Only   Mycophenolate Mofetil Nausea Only   Tacrolimus Diarrhea and Nausea Only   Prior to Admission medications   Medication Sig Start Date End Date Taking? Authorizing Provider  albuterol (PROVENTIL HFA;VENTOLIN HFA) 108 (90 Base) MCG/ACT inhaler Inhale 1-2 puffs into the lungs every 6 (six) hours as needed for wheezing or shortness of breath.  03/10/13  Yes [provider]   albuterol (PROVENTIL) (2.5 MG/3ML) 0.083% nebulizer solution Take 2.5 mg by nebulization every 4 (four) hours as needed. 01/03/20  Yes [provider]  atorvastatin (LIPITOR) 10 MG tablet Take 10 mg by mouth daily.  04/24/18  Yes [provider]  budesonide-formoterol (SYMBICORT) 160-4.5 MCG/ACT inhaler Inhale 2 puffs into the lungs daily.  07/02/19 07/01/20 Yes [provider]  busPIRone (BUSPAR) 5 MG tablet Take 5 mg by mouth at bedtime. 09/10/19  Yes [provider]  cetirizine (ZYRTEC ALLERGY) 10 MG tablet Take 10 mg by mouth daily.   Yes [provider]  diclofenac Sodium (VOLTAREN) 1 % GEL Apply 1 application topically every 6 (six) hours as needed. 07/06/19  Yes [provider]  ENULOSE 10 GM/15ML SOLN Take 20 g by mouth 2 (two) times daily.  12/20/19  Yes [provider]  escitalopram (LEXAPRO) 10 MG tablet Take 10 mg by mouth daily. 01/06/20  Yes [provider]  fluticasone (FLONASE) 50 MCG/ACT nasal spray Place 2 sprays into both nostrils daily.   Yes [provider]  furosemide (LASIX) 20 MG tablet Take 40 mg by mouth daily.  08/14/16  Yes [provider]  hydrOXYzine (ATARAX/VISTARIL) 10 MG tablet Take 10 mg by mouth 3 (three) times daily as needed. 12/10/19  Yes [provider]  insulin detemir (LEVEMIR) 100 UNIT/ML injection Inject 10-20 Units into the skin 2 (two) times daily. 20 units in morning (may add additional 20 units in evening depending on blood sugar)   Yes [provider]  lidocaine (XYLOCAINE) 5 % ointment APPLY 1 APPLICATION TOPICALLY TWO (2) TIMES A DAY. APPLY 5 GRAMS TO AREA UP TO TWICE DAILY 08/16/16  Yes [provider]  losartan (COZAAR) 100 MG tablet Take 100 mg by mouth daily. 07/27/19  Yes [provider]  montelukast (SINGULAIR) 10 MG tablet Take 10 mg by mouth at bedtime.  11/19/19 11/18/20 Yes [provider]  naloxone (NARCAN) 4 MG/0.1ML LIQD  nasal spray kit PLEASE SEE ATTACHED FOR DETAILED DIRECTIONS 08/18/18  Yes [provider]  oxyCODONE (OXYCONTIN) 20 mg 12 hr tablet Take 20 mg by mouth every 12 (twelve) hours.    Yes [provider]  SPIRIVA RESPIMAT 2.5 MCG/ACT AERS Inhale 2 puffs into the lungs daily in the afternoon. 07/22/19  Yes [provider]  spironolactone (ALDACTONE) 100 MG tablet Take 100 mg by mouth daily.   Yes [provider]  triamcinolone ointment (KENALOG) 0.5 % Apply 1 application topically 2 (two) times daily. 08/23/19  Yes [provider]  pantoprazole (PROTONIX) 40 MG tablet Take 1 tablet (40 mg total) by mouth 2 (two) times daily. 11/08/19 02/14/20  Harold Hedge, MD   Review of Systems  Constitutional: Positive for fatigue (minimal). Negative for appetite change.  HENT: Positive for congestion and postnasal drip. Negative for sore throat.   Eyes: Negative.   Respiratory: Positive for cough, shortness of breath (  with little exertion) and wheezing (better).   Cardiovascular: Negative for chest pain, palpitations and leg swelling.  Gastrointestinal: Negative for abdominal distention and abdominal pain.  Endocrine: Negative.   Genitourinary: Negative.   Musculoskeletal: Positive for arthralgias (left leg) and back pain.  Skin: Negative.   Allergic/Immunologic: Negative.   Neurological: Positive for light-headedness. Negative for dizziness.  Hematological: Negative for adenopathy. Does not bruise/bleed easily.  Psychiatric/Behavioral: Positive for sleep disturbance (sleeping on 3 pillows; wearing oxygen @ 2L). Negative for dysphoric mood. The patient is not nervous/anxious.    Vitals:   03/27/20 1324  BP: 120/74  Pulse: 84  Resp: 18  SpO2: 100%  Weight: 260 lb 6 oz (118.1 kg)  Height: 5' 3" (1.6 m)   Wt Readings from Last 3 Encounters:  03/27/20 260 lb 6 oz (118.1 kg)  02/14/20 242 lb (109.8 kg)  01/26/20 262 lb 9.6 oz (119.1 kg)   Lab Results   Component Value Date   CREATININE 0.83 01/26/2020   CREATININE 1.31 (H) 01/25/2020   CREATININE 2.46 (H) 01/24/2020    Physical Exam Vitals and nursing note reviewed. Exam conducted with a chaperone present (brother in the room).  Constitutional:      Appearance: Normal appearance.  HENT:     Head: Normocephalic and atraumatic.  Eyes:     Comments: Eyelids are puffy  Cardiovascular:     Rate and Rhythm: Normal rate and regular rhythm.  Pulmonary:     Effort: Pulmonary effort is normal. No respiratory distress.     Breath sounds: No wheezing or rales.  Abdominal:     General: There is no distension.     Palpations: Abdomen is soft.     Tenderness: There is no abdominal tenderness.  Musculoskeletal:     Cervical back: Normal range of motion and neck supple.     Right lower leg: No tenderness. Edema (1+ pitting) present.     Left lower leg: No tenderness. Edema (1+ pitting) present.  Skin:    General: Skin is warm and dry.  Neurological:     General: No focal deficit present.     Mental Status: [name] is alert and oriented to person, place, and time.  Psychiatric:        Mood and Affect: Mood normal.        Behavior: Behavior normal.        Thought Content: Thought content normal.    Assessment & Plan:  1: Chronic heart failure with preseved ejection fraction without structural changes- - NYHA class III - mildly fluid overloaded today - weighing daily but says that the scales don't also work; daughter is supposed to look at them; call for an overnight weight gain of > 2 pounds or a weekly weight gain of >5 pounds - weight up 18 pounds from last visit here 6 weeks ago - not adding salt and the importance of following a low sodium diet was discussed with her - discussed changing diuretic to torsemide or trying metolazone; patient would like to try metolazone so [name] is to take 2.70m daily for 3 days; take it 1/2 hour prior to morning lasix - also sent in 221m potassium to  take 1 tablet daily for 3 days - will check BMP next week - BNP 03/17/20 was 52.09 - reports receiving both covid vaccines, covid booster and her flu vaccine for this season  2: HTN- - BP looks good today - follows with PCP ([facility])  - BMP 03/17/20 reviewed and showed  sodium 140, potassium 4.0, creatinine 0.63 and GFR >90  3: DM- - A1c 01/22/20 was 6.9% - glucose at home this morning was 124  4: ILD- - saw pulmonology ([facility]) 03/17/20 - receives rituximab infusion every 4 months - smokes 1/2 ppd of cigarettes - wears oxygen at 2L at bedtime and during the day "sometimes"   Patient did not bring her medications nor a list. Each medication was verbally reviewed with the patient and [name] was encouraged to bring the bottles to every visit to confirm accuracy of list.  Return in 1 week or sooner for any questions/problems before then.

## 2020-03-27 NOTE — Patient Instructions (Addendum)
Continue weighing daily and call for an overnight weight gain of > 2 pounds or a weekly weight gain of >5 pounds.   Take booster fluid pill (metolazone) once a day for the next 3 days. Best to take it 1/2 hour before your morning lasix. Take 1 potassium pill daily for these next 3 days as well.     Preferred Pain Management Pain management physician [address] [facility]   [phone]   [facility] [address] [place]  [phone]

## 2020-04-04 ENCOUNTER — Ambulatory Visit: Payer: Medicaid Other | Admitting: Family

## 2020-04-05 NOTE — Progress Notes (Deleted)
Patient ID: DACEY MILBERGER, female    DOB: 01-15-1966, 54 y.o.   MRN: [id number]  HPI  Ms Brillhart is a 54 y/o female with a history of asthma, DM, hyperlipidemia, HTN, sleep apnea, ILD, hepatic cirrhosis, COPD, PTSD, current tobacco use and chronic heart failure.   Echo report from 01/22/20 reviewed and showed an EF of 60-65%.  Admitted 01/21/20 due to acute on chronic HF. Initially needed bipap & then weaned to her nasal cannula. Set up for NIV/trilogy at bedtime upon discharge. Renal ultrasound negative. Cardiology and nephrology consults obtained. Discharged after 5 days.   She presents today for a follow-up visit with a chief complaint of   Past Medical History:  Diagnosis Date  . Asthma   . CHF (congestive heart failure) (HCC)   . COPD (chronic obstructive pulmonary disease) (HCC)   . Diabetes mellitus without complication (HCC)   . Diastolic dysfunction    a. 03/2018 Echo [facility]): EF >55%, Gr1 DD. Nl RV fxn.  . Esophageal varices (HCC)    a. 10/2019 EGD: gr III esoph varices s/p banding.  . Essential hypertension   . GI bleed   . HCV (hepatitis C virus)   . Hematemesis    a. 10/2019 EGD: Gr III esoph varices, incompletely eradicated, banded. Non-bleeding ulcers tx w/ argon plasma coag. Single non-bleeding angiodysplastic lesion in duodenum tx w/ APC.  [name] Hepatic cirrhosis (HCC)    a. h/o ETOH/Hep C.  . Hypertension   . Interstitial lung disease (HCC)    a. Followed @ [facility] pulm. Prev on cellcept (caused GI side effects), Imuran (GI se), myfortic (elev LFTs). More recently on rituximab.  . Mixed hyperlipidemia   . Morbid obesity (HCC)   . O2 dependent   . PTSD (post-traumatic stress disorder)   . Sleep apnea   . Tobacco abuse    Past Surgical History:  Procedure Laterality Date  . CHOLECYSTECTOMY    . ESOPHAGOGASTRODUODENOSCOPY (EGD) WITH PROPOFOL N/A 11/04/2019   Procedure: ESOPHAGOGASTRODUODENOSCOPY (EGD) WITH PROPOFOL;  Surgeon: [name], MD;  Location: [facility]  ENDOSCOPY;  Service: Endoscopy;  Laterality: N/A;  . ORIF TIBIA PLATEAU Left 07/02/2015   Procedure: OPEN REDUCTION INTERNAL FIXATION (ORIF) TIBIAL PLATEAU;  Surgeon: Juanell Fairly, MD;  Location: [facility] ORS;  Service: Orthopedics;  Laterality: Left;   Family History  Problem Relation Age of Onset  . Heart failure Mother    Social History   Tobacco Use  . Smoking status: Current Every Day Smoker    Packs/day: 0.25    Years: 36.00    Pack years: 9.00    Types: Cigarettes  . Smokeless tobacco: Never Used  Substance Use Topics  . Alcohol use: Yes    Alcohol/week: 0.0 standard drinks    Comment: 2-3 drinks/wk. Prev heavy drinker.   Allergies  Allergen Reactions  . Other Other (See Comments)    Gel capsules causes GI problems  . Mycophenolate Sodium Other (See Comments)    transaminitis  . Azathioprine Nausea Only  . Mycophenolate Mofetil Nausea Only  . Tacrolimus Diarrhea and Nausea Only    Review of Systems  Constitutional: Positive for fatigue (minimal). Negative for appetite change.  HENT: Positive for congestion and postnasal drip. Negative for sore throat.   Eyes: Negative.   Respiratory: Positive for cough, shortness of breath (with little exertion) and wheezing (better).   Cardiovascular: Negative for chest pain, palpitations and leg swelling.  Gastrointestinal: Negative for abdominal distention and abdominal pain.  Endocrine: Negative.   Genitourinary:  Negative.   Musculoskeletal: Positive for arthralgias (left leg) and back pain.  Skin: Negative.   Allergic/Immunologic: Negative.   Neurological: Positive for light-headedness. Negative for dizziness.  Hematological: Negative for adenopathy. Does not bruise/bleed easily.  Psychiatric/Behavioral: Positive for sleep disturbance (sleeping on 3 pillows; wearing oxygen @ 2L). Negative for dysphoric mood. The patient is not nervous/anxious.      Physical Exam Vitals and nursing note reviewed. Exam conducted with a  chaperone present (brother in the room).  Constitutional:      Appearance: Normal appearance.  HENT:     Head: Normocephalic and atraumatic.  Eyes:     Comments: Eyelids are puffy  Cardiovascular:     Rate and Rhythm: Normal rate and regular rhythm.  Pulmonary:     Effort: Pulmonary effort is normal. No respiratory distress.     Breath sounds: No wheezing or rales.  Abdominal:     General: There is no distension.     Palpations: Abdomen is soft.     Tenderness: There is no abdominal tenderness.  Musculoskeletal:     Cervical back: Normal range of motion and neck supple.     Right lower leg: No tenderness. Edema (1+ pitting) present.     Left lower leg: No tenderness. Edema (1+ pitting) present.  Skin:    General: Skin is warm and dry.  Neurological:     General: No focal deficit present.     Mental Status: She is alert and oriented to person, place, and time.  Psychiatric:        Mood and Affect: Mood normal.        Behavior: Behavior normal.        Thought Content: Thought content normal.    Assessment & Plan:  1: Chronic heart failure with preseved ejection fraction without structural changes- - NYHA class III - mildly fluid overloaded today - weighing daily but says that the scales don't also work; daughter is supposed to look at them; call for an overnight weight gain of > 2 pounds or a weekly weight gain of >5 pounds - weight 260.6 pounds from last visit here 2 weeks ago - not adding salt and the importance of following a low sodium diet was discussed with her - gave metolazone daily for 3 days since she was last here - will check BMP today - BNP 03/17/20 was 52.09 - reports receiving both covid vaccines, covid booster and her flu vaccine for this season  2: HTN- - BP  - follows with PCP ([facility])  - BMP 03/17/20 reviewed and showed sodium 140, potassium 4.0, creatinine 0.63 and GFR >90  3: DM- - A1c 01/22/20 was 6.9% - glucose at home this morning was   4:  ILD- - saw pulmonology ([facility]) 03/17/20 - receives rituximab infusion every 4 months - smokes 1/2 ppd of cigarettes - wears oxygen at 2L at bedtime and during the day "sometimes"   Patient did not bring her medications nor a list. Each medication was verbally reviewed with the patient and she was encouraged to bring the bottles to every visit to confirm accuracy of list.

## 2020-04-06 ENCOUNTER — Ambulatory Visit: Payer: Medicaid Other | Admitting: Family

## 2020-04-10 ENCOUNTER — Other Ambulatory Visit
Admission: RE | Admit: 2020-04-10 | Discharge: 2020-04-10 | Disposition: A | Discharge status: Home / Self Care | Payer: Medicaid Other | Source: Ambulatory Visit | Attending: Family | Admitting: Family

## 2020-04-10 ENCOUNTER — Other Ambulatory Visit: Payer: Self-pay

## 2020-04-10 ENCOUNTER — Encounter: Payer: Self-pay | Admitting: Family

## 2020-04-10 ENCOUNTER — Ambulatory Visit: Payer: Medicaid Other | Admitting: Family

## 2020-04-10 VITALS — BP 96/61 | HR 100 | Resp 20 | Ht 63.0 in | Wt 253.0 lb

## 2020-04-10 DIAGNOSIS — E119 Type 2 diabetes mellitus without complications: Secondary | ICD-10-CM

## 2020-04-10 DIAGNOSIS — I5032 Chronic diastolic (congestive) heart failure: Secondary | ICD-10-CM | POA: Insufficient documentation

## 2020-04-10 DIAGNOSIS — Z79899 Other long term (current) drug therapy: Secondary | ICD-10-CM | POA: Insufficient documentation

## 2020-04-10 DIAGNOSIS — J449 Chronic obstructive pulmonary disease, unspecified: Secondary | ICD-10-CM | POA: Insufficient documentation

## 2020-04-10 DIAGNOSIS — I110 Hypertensive heart disease with heart failure: Secondary | ICD-10-CM | POA: Insufficient documentation

## 2020-04-10 DIAGNOSIS — E782 Mixed hyperlipidemia: Secondary | ICD-10-CM | POA: Insufficient documentation

## 2020-04-10 DIAGNOSIS — I10 Essential (primary) hypertension: Secondary | ICD-10-CM

## 2020-04-10 DIAGNOSIS — Z6841 Body Mass Index (BMI) 40.0 and over, adult: Secondary | ICD-10-CM | POA: Insufficient documentation

## 2020-04-10 DIAGNOSIS — J849 Interstitial pulmonary disease, unspecified: Secondary | ICD-10-CM | POA: Insufficient documentation

## 2020-04-10 DIAGNOSIS — Z7951 Long term (current) use of inhaled steroids: Secondary | ICD-10-CM | POA: Insufficient documentation

## 2020-04-10 DIAGNOSIS — F17210 Nicotine dependence, cigarettes, uncomplicated: Secondary | ICD-10-CM | POA: Insufficient documentation

## 2020-04-10 DIAGNOSIS — Z794 Long term (current) use of insulin: Secondary | ICD-10-CM | POA: Insufficient documentation

## 2020-04-10 LAB — BASIC METABOLIC PANEL
Anion gap: 12 (ref 5–15)
BUN: 21 mg/dL — ABNORMAL HIGH (ref 6–20)
CO2: 31 mmol/L (ref 22–32)
Calcium: 9.2 mg/dL (ref 8.9–10.3)
Chloride: 95 mmol/L — ABNORMAL LOW (ref 98–111)
Creatinine, Ser: 0.78 mg/dL (ref 0.44–1.00)
GFR, Estimated: >60 mL/min (ref >60)
Glucose, Bld: 101 mg/dL — ABNORMAL HIGH (ref 70–99)
Potassium: 3.7 mmol/L (ref 3.5–5.1)
Sodium: 138 mmol/L (ref 135–145)

## 2020-04-10 MED ORDER — FUROSEMIDE 40 MG PO TABS
40.0000 mg | ORAL_TABLET | Freq: Two times a day (BID) | ORAL | 3 refills | Status: DC
Start: 2020-04-10 — End: 2020-11-06

## 2020-04-10 NOTE — Progress Notes (Signed)
Patient ID: [name], female    DOB: 06-Oct-1965, 54 y.o.   MRN: [id number]  HPI  [name] is a 54 y/o female with a history of asthma, DM, hyperlipidemia, HTN, sleep apnea, ILD, hepatic cirrhosis, COPD, PTSD, current tobacco use and chronic heart failure.   Echo report from 01/22/20 reviewed and showed an EF of 60-65%.  Admitted 01/21/20 due to acute on chronic HF. Initially needed bipap & then weaned to her nasal cannula. Set up for NIV/trilogy at bedtime upon discharge. Renal ultrasound negative. Cardiology and nephrology consults obtained. Discharged after 5 days.   She presents today for a follow-up visit with a chief complaint of minimal shortness of breath upon moderate exertion. She describes this as chronic in nature having been present for several years. She has associated fatigue, cough, wheezing and chronic pain along with this. She denies any difficulty sleeping, abdominal distention, palpitations, pedal edema, chest pain, dizziness or weight gain.   She says that she took the metolazone and felt a little better. Overall, she says that she feels better today because she recently got an infusion.   Past Medical History:  Diagnosis Date  . Asthma   . CHF (congestive heart failure) ([place])   . COPD (chronic obstructive pulmonary disease) ([place])   . Diabetes mellitus without complication ([place])   . Diastolic dysfunction    a. 03/2018 Echo [facility]): EF >55%, Gr1 DD. Nl RV fxn.  . Esophageal varices ([place])    a. 10/2019 EGD: gr III esoph varices s/p banding.  . Essential hypertension   . GI bleed   . HCV (hepatitis C virus)   . Hematemesis    a. 10/2019 EGD: Gr III esoph varices, incompletely eradicated, banded. Non-bleeding ulcers tx w/ argon plasma coag. Single non-bleeding angiodysplastic lesion in duodenum tx w/ APC.  [name] Hepatic cirrhosis (HCC)    a. h/o ETOH/Hep C.  . Hypertension   . Interstitial lung disease ([place])    a. Followed @ [facility] pulm. Prev on cellcept (caused GI  side effects), Imuran (GI se), myfortic (elev LFTs). More recently on rituximab.  . Mixed hyperlipidemia   . Morbid obesity ([place])   . O2 dependent   . PTSD (post-traumatic stress disorder)   . Sleep apnea   . Tobacco abuse    Past Surgical History:  Procedure Laterality Date  . CHOLECYSTECTOMY    . ESOPHAGOGASTRODUODENOSCOPY (EGD) WITH PROPOFOL N/A 11/04/2019   Procedure: ESOPHAGOGASTRODUODENOSCOPY (EGD) WITH PROPOFOL;  Surgeon: Lucilla Lame, MD;  Location: [facility] ENDOSCOPY;  Service: Endoscopy;  Laterality: N/A;  . ORIF TIBIA PLATEAU Left 07/02/2015   Procedure: OPEN REDUCTION INTERNAL FIXATION (ORIF) TIBIAL PLATEAU;  Surgeon: Thornton Park, MD;  Location: [facility] ORS;  Service: Orthopedics;  Laterality: Left;   Family History  Problem Relation Age of Onset  . Heart failure Mother    Social History   Tobacco Use  . Smoking status: Current Every Day Smoker    Packs/day: 0.25    Years: 36.00    Pack years: 9.00    Types: Cigarettes  . Smokeless tobacco: Never Used  Substance Use Topics  . Alcohol use: Yes    Alcohol/week: 0.0 standard drinks    Comment: 2-3 drinks/wk. Prev heavy drinker.   Allergies  Allergen Reactions  . Other Other (See Comments)    Gel capsules causes GI problems  . Mycophenolate Sodium Other (See Comments)    transaminitis  . Azathioprine Nausea Only  . Mycophenolate Mofetil Nausea Only  . Tacrolimus Diarrhea  and Nausea Only   Prior to Admission medications   Medication Sig Start Date End Date Taking? Authorizing Provider  albuterol (PROVENTIL HFA;VENTOLIN HFA) 108 (90 Base) MCG/ACT inhaler Inhale 1-2 puffs into the lungs every 6 (six) hours as needed for wheezing or shortness of breath.  03/10/13  Yes [provider]  albuterol (PROVENTIL) (2.5 MG/3ML) 0.083% nebulizer solution Take 2.5 mg by nebulization every 4 (four) hours as needed. 01/03/20  Yes [provider]  atorvastatin (LIPITOR) 10 MG tablet Take 10 mg by mouth daily.  04/24/18   Yes [provider]  budesonide-formoterol (SYMBICORT) 160-4.5 MCG/ACT inhaler Inhale 2 puffs into the lungs daily.  07/02/19 07/01/20 Yes [provider]  busPIRone (BUSPAR) 5 MG tablet Take 5 mg by mouth at bedtime. 09/10/19  Yes [provider]  cetirizine (ZYRTEC) 10 MG tablet Take 10 mg by mouth daily.   Yes [provider]  diclofenac Sodium (VOLTAREN) 1 % GEL Apply 1 application topically every 6 (six) hours as needed. 07/06/19  Yes [provider]  ENULOSE 10 GM/15ML SOLN Take 20 g by mouth 2 (two) times daily.  12/20/19  Yes [provider]  escitalopram (LEXAPRO) 10 MG tablet Take 10 mg by mouth daily. 01/06/20  Yes [provider]  fluticasone (FLONASE) 50 MCG/ACT nasal spray Place 2 sprays into both nostrils daily.   Yes [provider]  hydrOXYzine (ATARAX/VISTARIL) 10 MG tablet Take 10 mg by mouth 3 (three) times daily as needed. 12/10/19  Yes [provider]  insulin detemir (LEVEMIR) 100 UNIT/ML injection Inject 10-20 Units into the skin 2 (two) times daily. 20 units in morning (may add additional 20 units in evening depending on blood sugar)   Yes [provider]  lidocaine (XYLOCAINE) 5 % ointment APPLY 1 APPLICATION TOPICALLY TWO (2) TIMES A DAY. APPLY 5 GRAMS TO AREA UP TO TWICE DAILY 08/16/16  Yes [provider]  losartan (COZAAR) 100 MG tablet Take 100 mg by mouth daily. 07/27/19  Yes [provider]  montelukast (SINGULAIR) 10 MG tablet Take 10 mg by mouth at bedtime.  11/19/19 11/18/20 Yes [provider]  naloxone (NARCAN) 4 MG/0.1ML LIQD nasal spray kit PLEASE SEE ATTACHED FOR DETAILED DIRECTIONS 08/18/18  Yes [provider]  pantoprazole (PROTONIX) 40 MG tablet Take 1 tablet (40 mg total) by mouth 2 (two) times daily. 11/08/19 02/14/20 Yes Harold Hedge, MD  potassium chloride SA (KLOR-CON) 20 MEQ tablet Take 1 tablet (20 mEq total) by mouth daily. Take with  metolazone for 3 days 03/27/20  Yes Taeya Theall A, FNP  SPIRIVA RESPIMAT 2.5 MCG/ACT AERS Inhale 2 puffs into the lungs daily in the afternoon. 07/22/19  Yes [provider]  spironolactone (ALDACTONE) 100 MG tablet Take 100 mg by mouth daily.   Yes [provider]  triamcinolone ointment (KENALOG) 0.5 % Apply 1 application topically 2 (two) times daily. 08/23/19  Yes [provider]  furosemide (LASIX) 40 MG tablet Take 1 tablet (40 mg total) by mouth 2 (two) times daily. 04/10/20   Alisa Graff, FNP  metolazone (ZAROXOLYN) 2.5 MG tablet Take 1 tablet (2.5 mg total) by mouth daily. Take 1/2 hour before morning lasix dose Patient not taking: Reported on 04/10/2020 03/27/20 06/25/20  Alisa Graff, FNP  oxyCODONE (OXYCONTIN) 20 mg 12 hr tablet Take 20 mg by mouth every 12 (twelve) hours.  Patient not taking: Reported on 04/10/2020    [provider]  pregabalin (LYRICA) 75 MG  capsule Take 75 mg by mouth 2 (two) times daily.    [provider]    Review of Systems  Constitutional: Positive for fatigue. Negative for appetite change.  HENT: Positive for congestion and postnasal drip. Negative for sore throat.   Eyes: Negative.   Respiratory: Positive for cough, shortness of breath and wheezing ("better").   Cardiovascular: Negative for leg swelling.  Gastrointestinal: Negative for abdominal distention.  Endocrine: Negative.   Genitourinary: Negative.   Musculoskeletal: Positive for arthralgias (left leg) and back pain.  Skin: Negative.   Allergic/Immunologic: Negative.   Neurological: Negative for dizziness and light-headedness.  Hematological: Negative for adenopathy. Does not bruise/bleed easily.  Psychiatric/Behavioral: Negative for dysphoric mood and sleep disturbance (sleeping on 3 pillows; wearing oxygen @ 2L). The patient is not nervous/anxious.    Vitals:   04/10/20 1347  BP: 96/61  Pulse: 100  Resp: 20  SpO2: 92%  Weight: 253 lb  (114.8 kg)  Height: '5\' 3"'  (1.6 m)   Wt Readings from Last 3 Encounters:  04/10/20 253 lb (114.8 kg)  03/27/20 260 lb 6 oz (118.1 kg)  02/14/20 242 lb (109.8 kg)   Lab Results  Component Value Date   CREATININE 0.78 04/10/2020   CREATININE 0.83 01/26/2020   CREATININE 1.31 (H) 01/25/2020    Physical Exam Vitals and nursing note reviewed.  Constitutional:      Appearance: Normal appearance.  HENT:     Head: Normocephalic and atraumatic.  Eyes:     Comments:    Cardiovascular:     Rate and Rhythm: Normal rate and regular rhythm.  Pulmonary:     Effort: Pulmonary effort is normal. No respiratory distress.     Breath sounds: No wheezing or rales.  Abdominal:     General: There is no distension.     Palpations: Abdomen is soft.     Tenderness: There is no abdominal tenderness.  Musculoskeletal:     Cervical back: Normal range of motion and neck supple.     Right lower leg: No tenderness. Edema (trace pitting) present.     Left lower leg: No tenderness. Edema (trace pitting) present.  Skin:    General: Skin is warm and dry.  Neurological:     General: No focal deficit present.     Mental Status: She is alert and oriented to person, place, and time.  Psychiatric:        Mood and Affect: Mood normal.        Behavior: Behavior normal.        Thought Content: Thought content normal.    Assessment & Plan:  1: Chronic heart failure with preseved ejection fraction without structural changes- - NYHA class II - euvolemic today - weighing daily; reminded to call for an overnight weight gain of > 2 pounds or a weekly weight gain of >5 pounds - weight down 7 pounds from last visit here 2 weeks ago - not adding salt and the importance of following a low sodium diet was discussed with her - gave metolazone daily for 3 days since she was last here - will check BMP today - BNP 03/17/20 was 52.09 - reports receiving both covid vaccines, covid booster and her flu vaccine for this  season  2: HTN- - BP on the low side today - follows with PCP ([facility])  - BMP 03/17/20 reviewed and showed sodium 140, potassium 4.0, creatinine 0.63 and GFR >90  3: DM- - A1c 01/22/20 was 6.9% - glucose at home  this morning was 110  4: ILD- - saw pulmonology ([facility]) 03/17/20 - receives rituximab infusion every 4 months & just recently received it & reports feeling much better afterwards - smokes 1/2 ppd of cigarettes - wears oxygen at 2L at bedtime and during the day "sometimes"   Medication bottles reviewed.   Return in 3 months or sooner for any questions/problems before then.

## 2020-04-10 NOTE — Patient Instructions (Addendum)
Continue weighing daily and call for an overnight weight gain of > 2 pounds or a weekly weight gain of >5 pounds.   When you pick up your new furosemide (lasix) bottle, it will be the 40mg  dose so you will then take 1 tablet in the morning and 1 tablet in the afternoon

## 2020-04-17 ENCOUNTER — Telehealth: Payer: Self-pay | Admitting: Family

## 2020-04-17 MED ORDER — POTASSIUM CHLORIDE CRYS ER 20 MEQ PO TBCR: 20.0000 meq | EXTENDED_RELEASE_TABLET | Freq: Every day | ORAL | 5 refills | Status: DC

## 2020-04-17 NOTE — Telephone Encounter (Signed)
Patient called to say that she's recently started cramping in her hands. She said that this just started and she was concerned about her potassium level. She is  currently taking spironolactone 100mg  daily and did take potassium supplement when she took metolazone.   Most recent potassium level on 04/10/20 was low normal at 3.7. Will send in potassium 04/12/20 to take 1 tablet PO daily. Will recheck BMP on 05/02/20 with verbal understanding from the patient.

## 2020-05-01 ENCOUNTER — Telehealth: Payer: Self-pay | Admitting: Family

## 2020-05-01 DIAGNOSIS — I5032 Chronic diastolic (congestive) heart failure: Secondary | ICD-10-CM

## 2020-05-01 NOTE — Telephone Encounter (Signed)
Patient needing labs drawn so order was placed for tomorrow (05/02/20)

## 2020-05-02 ENCOUNTER — Other Ambulatory Visit: Admission: RE | Admit: 2020-05-02 | Payer: Medicaid Other | Source: Ambulatory Visit

## 2020-07-03 NOTE — Progress Notes (Deleted)
Patient ID: [name], female    DOB: February 21, 1966, 55 y.o.   MRN: [id number]  HPI  [name] is a 55 y/o female with a history of asthma, DM, hyperlipidemia, HTN, sleep apnea, ILD, hepatic cirrhosis, COPD, PTSD, current tobacco use and chronic heart failure.   Echo report from 01/22/20 reviewed and showed an EF of 60-65%.  Admitted 01/21/20 due to acute on chronic HF. Initially needed bipap & then weaned to her nasal cannula. Set up for NIV/trilogy at bedtime upon discharge. Renal ultrasound negative. Cardiology and nephrology consults obtained. Discharged after 5 days.   She presents today for a follow-up visit with a chief complaint of   Past Medical History:  Diagnosis Date  . Asthma   . CHF (congestive heart failure) (HCC)   . COPD (chronic obstructive pulmonary disease) (HCC)   . Diabetes mellitus without complication (HCC)   . Diastolic dysfunction    a. 03/2018 Echo [facility]): EF >55%, Gr1 DD. Nl RV fxn.  . Esophageal varices (HCC)    a. 10/2019 EGD: gr III esoph varices s/p banding.  . Essential hypertension   . GI bleed   . HCV (hepatitis C virus)   . Hematemesis    a. 10/2019 EGD: Gr III esoph varices, incompletely eradicated, banded. Non-bleeding ulcers tx w/ argon plasma coag. Single non-bleeding angiodysplastic lesion in duodenum tx w/ APC.  [name] Hepatic cirrhosis (HCC)    a. h/o ETOH/Hep C.  . Hypertension   . Interstitial lung disease (HCC)    a. Followed @ [facility] pulm. Prev on cellcept (caused GI side effects), Imuran (GI se), myfortic (elev LFTs). More recently on rituximab.  . Mixed hyperlipidemia   . Morbid obesity (HCC)   . O2 dependent   . PTSD (post-traumatic stress disorder)   . Sleep apnea   . Tobacco abuse    Past Surgical History:  Procedure Laterality Date  . CHOLECYSTECTOMY    . ESOPHAGOGASTRODUODENOSCOPY (EGD) WITH PROPOFOL N/A 11/04/2019   Procedure: ESOPHAGOGASTRODUODENOSCOPY (EGD) WITH PROPOFOL;  Surgeon: [name], MD;  Location: [facility]  ENDOSCOPY;  Service: Endoscopy;  Laterality: N/A;  . ORIF TIBIA PLATEAU Left 07/02/2015   Procedure: OPEN REDUCTION INTERNAL FIXATION (ORIF) TIBIAL PLATEAU;  Surgeon: Juanell Fairly, MD;  Location: [facility] ORS;  Service: Orthopedics;  Laterality: Left;   Family History  Problem Relation Age of Onset  . Heart failure Mother    Social History   Tobacco Use  . Smoking status: Current Every Day Smoker    Packs/day: 0.25    Years: 36.00    Pack years: 9.00    Types: Cigarettes  . Smokeless tobacco: Never Used  Substance Use Topics  . Alcohol use: Yes    Alcohol/week: 0.0 standard drinks    Comment: 2-3 drinks/wk. Prev heavy drinker.   Allergies  Allergen Reactions  . Other Other (See Comments)    Gel capsules causes GI problems  . Mycophenolate Sodium Other (See Comments)    transaminitis  . Azathioprine Nausea Only  . Mycophenolate Mofetil Nausea Only  . Tacrolimus Diarrhea and Nausea Only     Review of Systems  Constitutional: Positive for fatigue. Negative for appetite change.  HENT: Positive for congestion and postnasal drip. Negative for sore throat.   Eyes: Negative.   Respiratory: Positive for cough, shortness of breath and wheezing ("better").   Cardiovascular: Negative for leg swelling.  Gastrointestinal: Negative for abdominal distention.  Endocrine: Negative.   Genitourinary: Negative.   Musculoskeletal: Positive for arthralgias (left leg) and  back pain.  Skin: Negative.   Allergic/Immunologic: Negative.   Neurological: Negative for dizziness and light-headedness.  Hematological: Negative for adenopathy. Does not bruise/bleed easily.  Psychiatric/Behavioral: Negative for dysphoric mood and sleep disturbance (sleeping on 3 pillows; wearing oxygen @ 2L). The patient is not nervous/anxious.      Physical Exam Vitals and nursing note reviewed.  Constitutional:      Appearance: Normal appearance.  HENT:     Head: Normocephalic and atraumatic.  Eyes:      Comments:    Cardiovascular:     Rate and Rhythm: Normal rate and regular rhythm.  Pulmonary:     Effort: Pulmonary effort is normal. No respiratory distress.     Breath sounds: No wheezing or rales.  Abdominal:     General: There is no distension.     Palpations: Abdomen is soft.     Tenderness: There is no abdominal tenderness.  Musculoskeletal:     Cervical back: Normal range of motion and neck supple.     Right lower leg: No tenderness. Edema (trace pitting) present.     Left lower leg: No tenderness. Edema (trace pitting) present.  Skin:    General: Skin is warm and dry.  Neurological:     General: No focal deficit present.     Mental Status: She is alert and oriented to person, place, and time.  Psychiatric:        Mood and Affect: Mood normal.        Behavior: Behavior normal.        Thought Content: Thought content normal.    Assessment & Plan:  1: Chronic heart failure with preseved ejection fraction without structural changes- - NYHA class II - euvolemic today - weighing daily; reminded to call for an overnight weight gain of > 2 pounds or a weekly weight gain of >5 pounds - weight 253 pounds from last visit here 3 months ago - not adding salt and the importance of following a low sodium diet was discussed with her  - will check BMP today as she didn't get it drawn previously - BNP 03/17/20 was 52.09 - reports receiving both covid vaccines, covid booster and her flu vaccine for this season  2: HTN- - BP  - follows with PCP ([facility])  - BMP 04/10/20 reviewed and showed sodium 138, potassium 3.7, creatinine 0.78 and GFR >60  3: DM- - A1c 01/22/20 was 6.9% - glucose at home this morning was   4: ILD- - saw [facility]monology ([facility]) 03/17/20 - receives rituximab infusion every 4 months & just recently received it & reports feeling much better afterwards - smokes 1/2 ppd of cigarettes - wears oxygen at 2L at bedtime and during the day "sometimes"   Medication  bottles reviewed.

## 2020-07-04 ENCOUNTER — Ambulatory Visit: Payer: Medicaid Other | Admitting: Family

## 2020-07-12 ENCOUNTER — Ambulatory Visit: Payer: Medicaid Other | Admitting: Family

## 2020-07-19 ENCOUNTER — Ambulatory Visit: Payer: Medicaid Other | Admitting: Family

## 2020-07-19 ENCOUNTER — Telehealth: Payer: Self-pay | Admitting: Family

## 2020-07-19 NOTE — Telephone Encounter (Signed)
Patient did not show for her [facility] appointment on 07/19/20. Will attempt to reschedule.  

## 2020-09-12 ENCOUNTER — Other Ambulatory Visit: Payer: Self-pay

## 2020-09-12 ENCOUNTER — Ambulatory Visit: Payer: Medicaid Other | Attending: Family | Admitting: Family

## 2020-09-12 ENCOUNTER — Encounter: Payer: Self-pay | Admitting: Family

## 2020-09-12 VITALS — BP 89/61 | HR 92 | Resp 18 | Ht 62.0 in | Wt 265.0 lb

## 2020-09-12 DIAGNOSIS — J449 Chronic obstructive pulmonary disease, unspecified: Secondary | ICD-10-CM | POA: Diagnosis not present

## 2020-09-12 DIAGNOSIS — I5089 Other heart failure: Secondary | ICD-10-CM | POA: Insufficient documentation

## 2020-09-12 DIAGNOSIS — Z794 Long term (current) use of insulin: Secondary | ICD-10-CM | POA: Insufficient documentation

## 2020-09-12 DIAGNOSIS — Z7951 Long term (current) use of inhaled steroids: Secondary | ICD-10-CM | POA: Diagnosis not present

## 2020-09-12 DIAGNOSIS — E785 Hyperlipidemia, unspecified: Secondary | ICD-10-CM | POA: Diagnosis not present

## 2020-09-12 DIAGNOSIS — J849 Interstitial pulmonary disease, unspecified: Secondary | ICD-10-CM

## 2020-09-12 DIAGNOSIS — I10 Essential (primary) hypertension: Secondary | ICD-10-CM

## 2020-09-12 DIAGNOSIS — I110 Hypertensive heart disease with heart failure: Secondary | ICD-10-CM | POA: Diagnosis present

## 2020-09-12 DIAGNOSIS — I5033 Acute on chronic diastolic (congestive) heart failure: Secondary | ICD-10-CM

## 2020-09-12 DIAGNOSIS — E119 Type 2 diabetes mellitus without complications: Secondary | ICD-10-CM | POA: Diagnosis not present

## 2020-09-12 DIAGNOSIS — F17210 Nicotine dependence, cigarettes, uncomplicated: Secondary | ICD-10-CM | POA: Insufficient documentation

## 2020-09-12 NOTE — Patient Instructions (Addendum)
Resume weighing daily and call for an overnight weight gain of > 2 pounds or a weekly weight gain of >5 pounds.  Call us after your emergency room visit

## 2020-09-12 NOTE — Progress Notes (Signed)
Patient ID: [name], female    DOB: 1966/03/25, 55 y.o.   MRN: [id number]  HPI  [name] is a 55 y/o female with a history of asthma, DM, hyperlipidemia, HTN, sleep apnea, ILD, hepatic cirrhosis, COPD, PTSD, current tobacco use and chronic heart failure.   Echo report from 01/22/20 reviewed and showed an EF of 60-65%.  Admitted 01/21/20 due to acute on chronic HF. Initially needed bipap & then weaned to her nasal cannula. Set up for NIV/trilogy at bedtime upon discharge. Renal ultrasound negative. Cardiology and nephrology consults obtained. Discharged after 5 days.   She presents today for an acute visit with a chief complaint of moderate shortness of breath with little exertion. She says that she's been short of breath for many months but over the last few weeks it has worsened. She has associated fatigue, productive cough, wheezing, pedal edema, abdominal distention, chronic pain, light-headedness and weight gain along with this. She denies any difficulty sleeping, palpitations or chest pain.   Hasn't been weighing consistently at home. Says that the reason she hasn't followed up here is because she's "dealing with a lot" of things at home which has been very stressful. Due to the stress, she's started smoking more.   Past Medical History:  Diagnosis Date  . Asthma   . CHF (congestive heart failure) ([place])   . COPD (chronic obstructive pulmonary disease) ([place])   . Diabetes mellitus without complication ([place])   . Diastolic dysfunction    a. 03/2018 Echo [facility]): EF >55%, Gr1 DD. Nl RV fxn.  . Esophageal varices ([place])    a. 10/2019 EGD: gr III esoph varices s/p banding.  . Essential hypertension   . GI bleed   . HCV (hepatitis C virus)   . Hematemesis    a. 10/2019 EGD: Gr III esoph varices, incompletely eradicated, banded. Non-bleeding ulcers tx w/ argon plasma coag. Single non-bleeding angiodysplastic lesion in duodenum tx w/ APC.  [name] Hepatic cirrhosis (HCC)    a. h/o ETOH/Hep  C.  . Hypertension   . Interstitial lung disease ([place])    a. Followed @ [facility] pulm. Prev on cellcept (caused GI side effects), Imuran (GI se), myfortic (elev LFTs). More recently on rituximab.  . Mixed hyperlipidemia   . Morbid obesity ([place])   . O2 dependent   . PTSD (post-traumatic stress disorder)   . Sleep apnea   . Tobacco abuse    Past Surgical History:  Procedure Laterality Date  . CHOLECYSTECTOMY    . ESOPHAGOGASTRODUODENOSCOPY (EGD) WITH PROPOFOL N/A 11/04/2019   Procedure: ESOPHAGOGASTRODUODENOSCOPY (EGD) WITH PROPOFOL;  Surgeon: Lucilla Lame, MD;  Location: [facility] ENDOSCOPY;  Service: Endoscopy;  Laterality: N/A;  . ORIF TIBIA PLATEAU Left 07/02/2015   Procedure: OPEN REDUCTION INTERNAL FIXATION (ORIF) TIBIAL PLATEAU;  Surgeon: Thornton Park, MD;  Location: [facility] ORS;  Service: Orthopedics;  Laterality: Left;   Family History  Problem Relation Age of Onset  . Heart failure Mother    Social History   Tobacco Use  . Smoking status: Current Every Day Smoker    Packs/day: 0.25    Years: 36.00    Pack years: 9.00    Types: Cigarettes  . Smokeless tobacco: Never Used  Substance Use Topics  . Alcohol use: Yes    Alcohol/week: 0.0 standard drinks    Comment: 2-3 drinks/wk. Prev heavy drinker.   Allergies  Allergen Reactions  . Other Other (See Comments)    Gel capsules causes GI problems  . Mycophenolate Sodium Other (  See Comments)    transaminitis  . Azathioprine Nausea Only  . Mycophenolate Mofetil Nausea Only  . Tacrolimus Diarrhea and Nausea Only   Prior to Admission medications   Medication Sig Start Date End Date Taking? Authorizing Provider  albuterol (PROVENTIL HFA;VENTOLIN HFA) 108 (90 Base) MCG/ACT inhaler Inhale 1-2 puffs into the lungs every 6 (six) hours as needed for wheezing or shortness of breath.  03/10/13  Yes [provider]  albuterol (PROVENTIL) (2.5 MG/3ML) 0.083% nebulizer solution Take 2.5 mg by nebulization every 4 (four) hours as needed.  01/03/20  Yes [provider]  atorvastatin (LIPITOR) 10 MG tablet Take 10 mg by mouth daily.  04/24/18  Yes [provider]  budesonide-formoterol (SYMBICORT) 160-4.5 MCG/ACT inhaler Inhale 2 puffs into the lungs daily.  07/02/19 09/12/20 Yes [provider]  busPIRone (BUSPAR) 5 MG tablet Take 5 mg by mouth at bedtime. 09/10/19  Yes [provider]  diclofenac Sodium (VOLTAREN) 1 % GEL Apply 1 application topically every 6 (six) hours as needed. 07/06/19  Yes [provider]  ENULOSE 10 GM/15ML SOLN Take 20 g by mouth 2 (two) times daily.  12/20/19  Yes [provider]  escitalopram (LEXAPRO) 10 MG tablet Take 10 mg by mouth daily. 01/06/20  Yes [provider]  fluticasone (FLONASE) 50 MCG/ACT nasal spray Place 2 sprays into both nostrils daily.   Yes [provider]  furosemide (LASIX) 40 MG tablet Take 1 tablet (40 mg total) by mouth 2 (two) times daily. 04/10/20  Yes Darylene Price A, FNP  hydrOXYzine (ATARAX/VISTARIL) 10 MG tablet Take 10 mg by mouth 3 (three) times daily as needed. 12/10/19  Yes [provider]  insulin detemir (LEVEMIR) 100 UNIT/ML injection Inject 10-20 Units into the skin 2 (two) times daily. 20 units in morning (may add additional 20 units in evening depending on blood sugar)   Yes [provider]  lidocaine (XYLOCAINE) 5 % ointment APPLY 1 APPLICATION TOPICALLY TWO (2) TIMES A DAY. APPLY 5 GRAMS TO AREA UP TO TWICE DAILY 08/16/16  Yes [provider]  losartan (COZAAR) 100 MG tablet Take 100 mg by mouth daily. 07/27/19  Yes [provider]  montelukast (SINGULAIR) 10 MG tablet Take 10 mg by mouth at bedtime.  11/19/19 11/18/20 Yes [provider]  naloxone (NARCAN) 4 MG/0.1ML LIQD nasal spray kit PLEASE SEE ATTACHED FOR DETAILED DIRECTIONS 08/18/18  Yes [provider]  potassium chloride SA (KLOR-CON) 20 MEQ tablet Take 1 tablet (20 mEq total) by mouth daily.  04/17/20  Yes Nakeshia Waldeck, Otila Kluver A, FNP  pregabalin (LYRICA) 75 MG capsule Take 75 mg by mouth 2 (two) times daily.   Yes [provider]  SPIRIVA RESPIMAT 2.5 MCG/ACT AERS Inhale 2 puffs into the lungs daily in the afternoon. 07/22/19  Yes [provider]  spironolactone (ALDACTONE) 100 MG tablet Take 100 mg by mouth daily.   Yes [provider]  triamcinolone ointment (KENALOG) 0.5 % Apply 1 application topically 2 (two) times daily. 08/23/19  Yes [provider]  cetirizine (ZYRTEC) 10 MG tablet Take 10 mg by mouth daily. Patient not taking: Reported on 09/12/2020    [provider]  oxyCODONE (OXYCONTIN) 20 mg 12 hr tablet Take 20 mg by mouth every 12 (twelve) hours.  Patient not taking: Reported on 09/12/2020    [provider]  pantoprazole (PROTONIX) 40 MG tablet Take 1 tablet (40 mg total) by mouth 2 (two) times daily. 11/08/19 02/14/20  Marva Panda  E, MD   Review of Systems  Constitutional: Positive for fatigue. Negative for appetite change.  HENT: Positive for congestion. Negative for postnasal drip and sore throat.   Eyes: Negative.   Respiratory: Positive for cough (productive), shortness of breath (easily) and wheezing.   Cardiovascular: Positive for leg swelling. Negative for chest pain and palpitations.  Gastrointestinal: Positive for abdominal distention. Negative for abdominal pain.  Endocrine: Negative.   Genitourinary: Negative.   Musculoskeletal: Positive for arthralgias (legs) and back pain.  Skin: Negative.   Allergic/Immunologic: Negative.   Neurological: Positive for light-headedness. Negative for dizziness.  Hematological: Negative for adenopathy. Does not bruise/bleed easily.  Psychiatric/Behavioral: Positive for dysphoric mood. Negative for sleep disturbance (sleeping on 3 pillows; wearing oxygen @ 2L). The patient is not nervous/anxious.    Vitals:   09/12/20 1246  BP: (!) 89/61  Pulse: 92  Resp: 18  SpO2: 97%   Weight: 265 lb (120.2 kg)  Height: _0  (1.575 m)   Wt Readings from Last 3 Encounters:  09/12/20 265 lb (120.2 kg)  04/10/20 253 lb (114.8 kg)  03/27/20 260 lb 6 oz (118.1 kg)   Lab Results  Component Value Date   CREATININE 0.78 04/10/2020   CREATININE 0.83 01/26/2020   CREATININE 1.31 (H) 01/25/2020    Physical Exam Vitals and nursing note reviewed.  Constitutional:      Appearance: Normal appearance.  HENT:     Head: Normocephalic and atraumatic.  Eyes:     Comments:    Cardiovascular:     Rate and Rhythm: Normal rate and regular rhythm.  Pulmonary:     Effort: Pulmonary effort is normal. No respiratory distress.     Breath sounds: Examination of the right-lower field reveals wheezing. Examination of the left-lower field reveals wheezing. Wheezing present. No rales.  Abdominal:     General: There is no distension.     Palpations: Abdomen is soft.     Tenderness: There is no abdominal tenderness.  Musculoskeletal:     Cervical back: Normal range of motion and neck supple.     Right lower leg: No tenderness. Edema (2+ pitting) present.     Left lower leg: No tenderness. Edema (2+ pitting) present.  Skin:    General: Skin is warm and dry.  Neurological:     General: No focal deficit present.     Mental Status: She is alert and oriented to person, place, and time.  Psychiatric:        Mood and Affect: Mood normal.        Behavior: Behavior normal.        Thought Content: Thought content normal.    Assessment & Plan:  1: Acute on Chronic heart failure with preseved ejection fraction without structural changes- - NYHA class III - fluid overloaded today with edema to knees, wheezing, weight gain and worsening shortness of breath - not weighing daily; encouraged her to resume so that she can call for an overnight weight gain of > 2 pounds or a weekly weight gain of >5 pounds - weight up 12 pounds from last visit here 5 months ago - not adding salt  - explained  that with her BP being low, HR slightly elevated at 92 and her edema, it would be safest for her to go to the ED for possible IVF/ diuresis and/ or admittance; patient is agreeable but says that she will be going down to [facility] because that's where her pulmonologist is and because she didn't have  a good experience last time at [facility] - BNP 03/17/20 was 52.09  2: HTN- - BP low today (89/61; rechecked was 90/60) and HR 92 - follows with PCP ([place])  - BMP 04/10/20 reviewed and showed sodium 138, potassium 3.7, creatinine 0.78 and GFR >90  3: DM- - A1c 01/22/20 was 6.9% - glucose at home this morning was 129  4: ILD- - saw pulmonology ([place]) 03/17/20; returns 09/29/20 - receives rituximab infusion every 4 months although missed recent infusion - 6 MWT completed 03/17/20 - smokes 1/2 ppd of cigarettes - wears oxygen at 2L at bedtime and during the day "sometimes" - wearing CPAP at bedtime  - per patient request, did call Dr. Geraldine Solar office to advise of patient's symptoms and my recommendation of going to the ED. Had to leave a message on the nurse line   Patient did not bring her medications nor a list. Each medication was verbally reviewed with the patient and she was encouraged to bring the bottles to every visit to confirm accuracy of list.  Return here pending ED disposition

## 2020-09-18 ENCOUNTER — Ambulatory Visit: Payer: Medicaid Other | Admitting: Family

## 2020-09-19 ENCOUNTER — Encounter: Payer: Self-pay | Admitting: Family

## 2020-09-19 ENCOUNTER — Encounter: Payer: Self-pay | Admitting: Pharmacist

## 2020-09-19 ENCOUNTER — Other Ambulatory Visit: Payer: Self-pay

## 2020-09-19 ENCOUNTER — Ambulatory Visit: Payer: Medicaid Other | Attending: Family | Admitting: Family

## 2020-09-19 VITALS — BP 119/75 | HR 72 | Resp 18 | Ht 63.0 in | Wt 193.5 lb

## 2020-09-19 DIAGNOSIS — J45909 Unspecified asthma, uncomplicated: Secondary | ICD-10-CM | POA: Diagnosis not present

## 2020-09-19 DIAGNOSIS — J449 Chronic obstructive pulmonary disease, unspecified: Secondary | ICD-10-CM | POA: Insufficient documentation

## 2020-09-19 DIAGNOSIS — I110 Hypertensive heart disease with heart failure: Secondary | ICD-10-CM | POA: Diagnosis not present

## 2020-09-19 DIAGNOSIS — J849 Interstitial pulmonary disease, unspecified: Secondary | ICD-10-CM | POA: Insufficient documentation

## 2020-09-19 DIAGNOSIS — Z79899 Other long term (current) drug therapy: Secondary | ICD-10-CM | POA: Diagnosis not present

## 2020-09-19 DIAGNOSIS — Z8249 Family history of ischemic heart disease and other diseases of the circulatory system: Secondary | ICD-10-CM | POA: Diagnosis not present

## 2020-09-19 DIAGNOSIS — Z794 Long term (current) use of insulin: Secondary | ICD-10-CM | POA: Diagnosis not present

## 2020-09-19 DIAGNOSIS — F17210 Nicotine dependence, cigarettes, uncomplicated: Secondary | ICD-10-CM | POA: Insufficient documentation

## 2020-09-19 DIAGNOSIS — E782 Mixed hyperlipidemia: Secondary | ICD-10-CM | POA: Diagnosis not present

## 2020-09-19 DIAGNOSIS — I509 Heart failure, unspecified: Secondary | ICD-10-CM | POA: Insufficient documentation

## 2020-09-19 DIAGNOSIS — I10 Essential (primary) hypertension: Secondary | ICD-10-CM

## 2020-09-19 DIAGNOSIS — E119 Type 2 diabetes mellitus without complications: Secondary | ICD-10-CM | POA: Diagnosis not present

## 2020-09-19 DIAGNOSIS — Z7951 Long term (current) use of inhaled steroids: Secondary | ICD-10-CM | POA: Diagnosis not present

## 2020-09-19 DIAGNOSIS — I5032 Chronic diastolic (congestive) heart failure: Secondary | ICD-10-CM

## 2020-09-19 NOTE — Progress Notes (Signed)
[facility] - PHARMACIST COUNSELING NOTE  Guideline-Directed Medical Therapy/Evidence Based Medicine  ACE/ARB/ARNI:  Cozaar (Losartan) 100 mg daily on hold Beta Blocker: None Aldosterone Antagonist: spironolactone 100 mg daily on hold Diuretic: furosemide (LASIX) 40 mg every day on hold SGLT2i: None  Adherence Assessment  Do you ever forget to take your medication? _0 Yes _1 No  Do you ever skip doses due to side effects? _2 Yes _3 No  Do you have trouble affording your medicines? _4 Yes _5 No  Are you ever unable to pick up your medication due to transportation difficulties? _6 Yes _7 No  Do you ever stop taking your medications because you don't believe they are helping? _8 Yes _9 No   Adherence strategy: Pt endorses adherence as part of normal daily routine   Vital signs: HR 72, BP 119/75, weight (pounds) 193 ECHO: Date 01/22/2020, EF 60-65%  BMP Latest Ref Rng & Units 04/10/2020 01/26/2020 01/25/2020  Glucose 70 - 99 mg/dL 101(H) 130(H) 113(H)  BUN 6 - 20 mg/dL 21(H) 33(H) 39(H)  Creatinine 0.44 - 1.00 mg/dL 0.78 0.83 1.31(H)  Sodium 135 - 145 mmol/L 138 139 138  Potassium 3.5 - 5.1 mmol/L 3.7 4.8 4.6  Chloride 98 - 111 mmol/L 95(L) 100 102  CO2 22 - 32 mmol/L _10 Calcium 8.9 - 10.3 mg/dL 9.2 8.9 8.5(L)    Past Medical History:  Diagnosis Date  . Asthma   . CHF (congestive heart failure) ([place])   . COPD (chronic obstructive pulmonary disease) ([place])   . Diabetes mellitus without complication ([place])   . Diastolic dysfunction    a. 03/2018 Echo [facility]): EF >55%, Gr1 DD. Nl RV fxn.  . Esophageal varices ([place])    a. 10/2019 EGD: gr III esoph varices s/p banding.  . Essential hypertension   . GI bleed   . HCV (hepatitis C virus)   . Hematemesis    a. 10/2019 EGD: Gr III esoph varices, incompletely eradicated, banded. Non-bleeding ulcers tx w/ argon plasma coag. Single non-bleeding angiodysplastic lesion in duodenum tx w/ APC.  [name]  Hepatic cirrhosis (HCC)    a. h/o ETOH/Hep C.  . Hypertension   . Interstitial lung disease ([place])    a. Followed @ [facility] pulm. Prev on cellcept (caused GI side effects), Imuran (GI se), myfortic (elev LFTs). More recently on rituximab.  . Mixed hyperlipidemia   . Morbid obesity ([place])   . O2 dependent   . PTSD (post-traumatic stress disorder)   . Sleep apnea   . Tobacco abuse     ASSESSMENT 55 year old female who presents to the HF clinic for a follow up visit. She was recently in the hospital for SOB and CHR and received IV lasiv 20m BID. Due to her BP being low, her spironolactone, losartan, and furosemide were all discontinued. Pt blood pressure today is 119/75. She states she is supposed to start getting daily weight and BP checks to monitor.   Recent ED Visit (past 6 months): Date 09/12/2020, [facility] CHF/SOB  PLAN CHF/HTN -discussed with provider possibility of restarting furosemide on patient. Provider discussed with patient. Decision was to hold off for now and pt to follow up for reassessment of restarting medications as BP tolerates -hold spironolactone and losartan -continue KCl 239m daily  COPD -continue Breo and Incruse inhalers as prescribed and use albuterol PRN -pt also has Rx for Symbicort (she knows not to use both at the same time; she utilizes whichever inhaler is available from the pharmacy)  HLD -continue  atorvastatin 10 mg daily  Depression -continue escitalopram 20 mg daily   Time spent: 10 minutes  Sherilyn Banker, PharmD Pharmacy Resident  09/19/2020 11:40 AM    Current Outpatient Medications:  .  albuterol (PROVENTIL HFA;VENTOLIN HFA) 108 (90 Base) MCG/ACT inhaler, Inhale 1-2 puffs into the lungs every 6 (six) hours as needed for wheezing or shortness of breath. , Disp: , Rfl:  .  albuterol (PROVENTIL) (2.5 MG/3ML) 0.083% nebulizer solution, Take 2.5 mg by nebulization every 4 (four) hours as needed., Disp: , Rfl:  .  atorvastatin (LIPITOR) 10 MG  tablet, Take 10 mg by mouth daily. , Disp: , Rfl:  .  budesonide-formoterol (SYMBICORT) 160-4.5 MCG/ACT inhaler, Inhale 2 puffs into the lungs daily.  (Patient not taking: Reported on 09/19/2020), Disp: , Rfl:  .  cetirizine (ZYRTEC) 10 MG tablet, Take 10 mg by mouth daily., Disp: , Rfl:  .  diclofenac Sodium (VOLTAREN) 1 % GEL, Apply 1 application topically every 6 (six) hours as needed., Disp: , Rfl:  .  ENULOSE 10 GM/15ML SOLN, Take 20 g by mouth daily as needed., Disp: , Rfl:  .  escitalopram (LEXAPRO) 10 MG tablet, Take 20 mg by mouth daily., Disp: , Rfl:  .  ferrous sulfate 325 (65 FE) MG tablet, Take 325 mg by mouth every other day., Disp: , Rfl:  .  fluticasone (FLONASE) 50 MCG/ACT nasal spray, Place 1 spray into both nostrils daily., Disp: , Rfl:  .  fluticasone furoate-vilanterol (BREO ELLIPTA) 200-25 MCG/INH AEPB, Inhale 1 puff into the lungs daily., Disp: , Rfl:  .  furosemide (LASIX) 40 MG tablet, Take 1 tablet (40 mg total) by mouth 2 (two) times daily. (Patient not taking: Reported on 09/19/2020), Disp: 180 tablet, Rfl: 3 .  hydrOXYzine (ATARAX/VISTARIL) 10 MG tablet, Take 10 mg by mouth 3 (three) times daily as needed., Disp: , Rfl:  .  insulin detemir (LEVEMIR) 100 UNIT/ML injection, Inject 15 Units into the skin at bedtime., Disp: , Rfl:  .  lidocaine (XYLOCAINE) 5 % ointment, APPLY 1 APPLICATION TOPICALLY TWO (2) TIMES A DAY. APPLY 5 GRAMS TO AREA UP TO TWICE DAILY, Disp: , Rfl: 3 .  losartan (COZAAR) 100 MG tablet, Take 100 mg by mouth daily. (Patient not taking: Reported on 09/19/2020), Disp: , Rfl:  .  montelukast (SINGULAIR) 10 MG tablet, Take 10 mg by mouth at bedtime. , Disp: , Rfl:  .  Multiple Vitamins-Minerals (HAIR SKIN AND NAILS FORMULA) TABS, Take 1 tablet by mouth daily., Disp: , Rfl:  .  naloxone (NARCAN) 4 MG/0.1ML LIQD nasal spray kit, PLEASE SEE ATTACHED FOR DETAILED DIRECTIONS (Patient not taking: Reported on 09/19/2020), Disp: , Rfl:  .  oxyCODONE (OXYCONTIN) 20 mg  12 hr tablet, Take 20 mg by mouth every 12 (twelve) hours.  (Patient not taking: No sig reported), Disp: , Rfl:  .  pantoprazole (PROTONIX) 40 MG tablet, Take 1 tablet (40 mg total) by mouth 2 (two) times daily., Disp: 30 tablet, Rfl: 1 .  potassium chloride SA (KLOR-CON) 20 MEQ tablet, Take 1 tablet (20 mEq total) by mouth daily. (Patient not taking: Reported on 09/19/2020), Disp: 30 tablet, Rfl: 5 .  pregabalin (LYRICA) 75 MG capsule, Take 75 mg by mouth 2 (two) times daily. (Patient not taking: Reported on 09/19/2020), Disp: , Rfl:  .  SPIRIVA RESPIMAT 2.5 MCG/ACT AERS, Inhale 2 puffs into the lungs daily as needed., Disp: , Rfl:  .  spironolactone (ALDACTONE) 100 MG tablet, Take 100 mg by  mouth daily. (Patient not taking: Reported on 09/19/2020), Disp: , Rfl:  .  triamcinolone ointment (KENALOG) 0.5 %, Apply 1 application topically 2 (two) times daily., Disp: , Rfl:  .  umeclidinium bromide (INCRUSE ELLIPTA) 62.5 MCG/INH AEPB, Inhale 1 puff into the lungs daily., Disp: , Rfl:    COUNSELING POINTS/CLINICAL PEARLS Losartan (Goal: 150 mg once daily)  Warn female patient to avoid pregnancy and to report a pregnancy that occurs during therapy.  Side effects may include dizziness, upper respiratory infection, nasal congestion, and back pain.  Warn patient to avoid use of potassium supplements or potassium-containing salt substitutes unless they consult healthcare provider. Furosemide  Drug causes sun-sensitivity. Advise patient to use sunscreen and avoid tanning beds. Patient should avoid activities requiring coordination until drug effects are realized, as drug may cause dizziness, vertigo, or blurred vision. This drug may cause hyperglycemia, hyperuricemia, constipation, diarrhea, loss of appetite, nausea, vomiting, purpuric disorder, cramps, spasticity, asthenia, headache, paresthesia, or scaling eczema. Instruct patient to report unusual bleeding/bruising or signs/symptoms of hypotension,  infection, pancreatitis, or ototoxicity (tinnitus, hearing impairment). Advise patient to report signs/symptoms of a severe skin reactions (flu-like symptoms, spreading red rash, or skin/mucous membrane blistering) or erythema multiforme. Instruct patient to eat high-potassium foods during drug therapy, as directed by healthcare professional.  Patient should not drink alcohol while taking this drug. Spironolactone  Warn patient to report dehydration, hypotension, or symptoms of worsening renal function.  Counsel female patient to report gynecomastia.  Side effects may include diarrhea, nausea, vomiting, abdominal cramping, fever, leg cramps, lethargy, mental confusion, decreased libido, irregular menses, and rash. Suspension: Tell patient to take drug consistently with respect to food, either before or after a meal.  Advise patient to avoid potassium supplements and foods containing high levels of potassium, including salt substitutes.  DRUGS TO AVOID IN HEART FAILURE  Drug or Class Mechanism  Analgesics . NSAIDs . COX-2 inhibitors . Glucocorticoids  Sodium and water retention, increased systemic vascular resistance, decreased response to diuretics   Diabetes Medications . Metformin . Thiazolidinediones o Rosiglitazone (Avandia) o Pioglitazone (Actos) . DPP4 Inhibitors o Saxagliptin (Onglyza) o Sitagliptin (Januvia)   Lactic acidosis Possible calcium channel blockade   Unknown  Antiarrhythmics . Class I  o Flecainide o Disopyramide . Class III o Sotalol . Other o Dronedarone  Negative inotrope, proarrhythmic   Proarrhythmic, beta blockade  Negative inotrope  Antihypertensives . Alpha Blockers o Doxazosin . Calcium Channel Blockers o Diltiazem o Verapamil o Nifedipine . Central Alpha Adrenergics o Moxonidine . Peripheral Vasodilators o Minoxidil  Increases renin and aldosterone  Negative inotrope    Possible sympathetic withdrawal  Unknown   Anti-infective . Itraconazole . Amphotericin B  Negative inotrope Unknown  Hematologic . Anagrelide . Cilostazol   Possible inhibition of PD IV Inhibition of PD III causing arrhythmias  Neurologic/Psychiatric . Stimulants . Anti-Seizure Drugs o Carbamazepine o Pregabalin . Antidepressants o Tricyclics o Citalopram . Parkinsons o Bromocriptine o Pergolide o Pramipexole . Antipsychotics o Clozapine . Antimigraine o Ergotamine o Methysergide . Appetite suppressants . Bipolar o Lithium  Peripheral alpha and beta agonist activity  Negative inotrope and chronotrope Calcium channel blockade  Negative inotrope, proarrhythmic Dose-dependent QT prolongation  Excessive serotonin activity/valvular damage Excessive serotonin activity/valvular damage Unknown  IgE mediated hypersensitivy, calcium channel blockade  Excessive serotonin activity/valvular damage Excessive serotonin activity/valvular damage Valvular damage  Direct myofibrillar degeneration, adrenergic stimulation  Antimalarials . Chloroquine . Hydroxychloroquine Intracellular inhibition of lysosomal enzymes  Urologic Agents . Alpha Blockers o Doxazosin o  Prazosin o Tamsulosin o Terazosin  Increased renin and aldosterone  Adapted from Page Carleene Overlie, et al. "Drugs That May Cause or Exacerbate Heart Failure: A Scientific Statement from the [place]." Circulation 2016; 242:A83-M19. DOI: 10.1161/CIR.0000000000000426   MEDICATION ADHERENCES TIPS AND STRATEGIES 1. Taking medication as prescribed improves patient outcomes in heart failure (reduces hospitalizations, improves symptoms, increases survival) 2. Side effects of medications can be managed by decreasing doses, switching agents, stopping drugs, or adding additional therapy. Please let someone in the [facility] know if you have having bothersome side effects so we can modify your regimen. Do not alter your medication regimen  without talking to [place].  3. Medication reminders can help patients remember to take drugs on time. If you are missing or forgetting doses you can try linking behaviors, using pill boxes, or an electronic reminder like an alarm on your phone or an app. Some people can also get automated phone calls as medication reminders.

## 2020-09-19 NOTE — Patient Instructions (Addendum)
Continue weighing daily and call for an overnight weight gain of > 2 pounds or a weekly weight gain of >5 pounds.    Begin wearing compression socks daily with removal at bedtime.  

## 2020-09-19 NOTE — Progress Notes (Signed)
Patient ID: [name], female    DOB: 1965-12-09, 55 y.o.   MRN: [id number]   [name] is a 55 y/o female with a history of asthma, DM, hyperlipidemia, HTN, sleep apnea, ILD, hepatic cirrhosis, COPD, PTSD, current tobacco use and chronic heart failure.   Echo report from 09/13/20 reviewed and showed an EF of 55-60% with mild LAE. Echo report from 01/22/20 reviewed and showed an EF of 60-65%.  Admitted 09/12/20 due to HF exacerbation & cirrhosis. Liver doppler done. Initially given IV lasix with transition to oral diuretics with subsequent dose adjustment due to hypotension. Given a dose of IV iron. PT/ OT consults obtained. Discharged after 3 days. Admitted 01/21/20 due to acute on chronic HF. Initially needed bipap & then weaned to her nasal cannula. Set up for NIV/trilogy at bedtime upon discharge. Renal ultrasound negative. Cardiology and nephrology consults obtained. Discharged after 5 days.   She presents today for a follow-up visit with a chief complaint of moderate fatigue upon minimal exertion. She describes this as chronic in nature having been present for several years. She has associated cough, shortness of breath, pedal edema (improving), abdominal distention (improving), depression and weight loss along with this. She denies any difficulty sleeping, palpitations, chest pain, wheezing, dizziness or weight gain.   She says that she's getting home health nursing and PT. Is waiting on a BP monitor/ scale that will automatically transmit readings. Says that her furosemide, spironolactone and losartan are on hold due to her low blood pressure.   Past Medical History:  Diagnosis Date  . Asthma   . CHF (congestive heart failure) ([place])   . COPD (chronic obstructive pulmonary disease) ([place])   . Diabetes mellitus without complication ([place])   . Diastolic dysfunction    a. 03/2018 Echo [facility]): EF >55%, Gr1 DD. Nl RV fxn.  . Esophageal varices ([place])    a. 10/2019 EGD: gr III esoph varices  s/p banding.  . Essential hypertension   . GI bleed   . HCV (hepatitis C virus)   . Hematemesis    a. 10/2019 EGD: Gr III esoph varices, incompletely eradicated, banded. Non-bleeding ulcers tx w/ argon plasma coag. Single non-bleeding angiodysplastic lesion in duodenum tx w/ APC.  [name] Hepatic cirrhosis (HCC)    a. h/o ETOH/Hep C.  . Hypertension   . Interstitial lung disease ([place])    a. Followed @ [facility] pulm. Prev on cellcept (caused GI side effects), Imuran (GI se), myfortic (elev LFTs). More recently on rituximab.  . Mixed hyperlipidemia   . Morbid obesity ([place])   . O2 dependent   . PTSD (post-traumatic stress disorder)   . Sleep apnea   . Tobacco abuse    Past Surgical History:  Procedure Laterality Date  . CHOLECYSTECTOMY    . ESOPHAGOGASTRODUODENOSCOPY (EGD) WITH PROPOFOL N/A 11/04/2019   Procedure: ESOPHAGOGASTRODUODENOSCOPY (EGD) WITH PROPOFOL;  Surgeon: Lucilla Lame, MD;  Location[facility] ENDOSCOPY;  Service: Endoscopy;  Laterality: N/A;  . ORIF TIBIA PLATEAU Left 07/02/2015   Procedure: OPEN REDUCTION INTERNAL FIXATION (ORIF) TIBIAL PLATEAU;  Surgeon: Thornton Park, MD;  Location: [facility] ORS;  Service: Orthopedics;  Laterality: Left;   Family History  Problem Relation Age of Onset  . Heart failure Mother    Social History   Tobacco Use  . Smoking status: Current Every Day Smoker    Packs/day: 0.25    Years: 36.00    Pack years: 9.00    Types: Cigarettes  . Smokeless tobacco: Never Used  Substance  Use Topics  . Alcohol use: Yes    Alcohol/week: 0.0 standard drinks    Comment: 2-3 drinks/wk. Prev heavy drinker.   Allergies  Allergen Reactions  . Other Other (See Comments)    Gel capsules causes GI problems  . Mycophenolate Sodium Other (See Comments)    transaminitis  . Azathioprine Nausea Only  . Mycophenolate Mofetil Nausea Only  . Tacrolimus Diarrhea and Nausea Only   Prior to Admission medications   Medication Sig Start Date End Date Taking? Authorizing Provider   albuterol (PROVENTIL HFA;VENTOLIN HFA) 108 (90 Base) MCG/ACT inhaler Inhale 1-2 puffs into the lungs every 6 (six) hours as needed for wheezing or shortness of breath.  03/10/13  Yes [provider]  albuterol (PROVENTIL) (2.5 MG/3ML) 0.083% nebulizer solution Take 2.5 mg by nebulization every 4 (four) hours as needed. 01/03/20  Yes [provider]  atorvastatin (LIPITOR) 10 MG tablet Take 10 mg by mouth daily.  04/24/18  Yes [provider]  cetirizine (ZYRTEC) 10 MG tablet Take 10 mg by mouth daily.   Yes [provider]  diclofenac Sodium (VOLTAREN) 1 % GEL Apply 1 application topically every 6 (six) hours as needed. 07/06/19  Yes [provider]  ENULOSE 10 GM/15ML SOLN Take 20 g by mouth daily as needed. 12/20/19  Yes [provider]  escitalopram (LEXAPRO) 10 MG tablet Take 20 mg by mouth daily. 01/06/20  Yes [provider]  ferrous sulfate 325 (65 FE) MG tablet Take 325 mg by mouth every other day.   Yes [provider]  fluticasone (FLONASE) 50 MCG/ACT nasal spray Place 1 spray into both nostrils daily.   Yes [provider]  fluticasone furoate-vilanterol (BREO ELLIPTA) 200-25 MCG/INH AEPB Inhale 1 puff into the lungs daily.   Yes [provider]  hydrOXYzine (ATARAX/VISTARIL) 10 MG tablet Take 10 mg by mouth 3 (three) times daily as needed. 12/10/19  Yes [provider]  insulin detemir (LEVEMIR) 100 UNIT/ML injection Inject 15 Units into the skin at bedtime.   Yes [provider]  lidocaine (XYLOCAINE) 5 % ointment APPLY 1 APPLICATION TOPICALLY TWO (2) TIMES A DAY. APPLY 5 GRAMS TO AREA UP TO TWICE DAILY 08/16/16  Yes [provider]  montelukast (SINGULAIR) 10 MG tablet Take 10 mg by mouth at bedtime.  11/19/19 11/18/20 Yes [provider]  Multiple Vitamins-Minerals (HAIR SKIN AND NAILS FORMULA) TABS Take 1 tablet by mouth daily.   Yes [provider]   pantoprazole (PROTONIX) 40 MG tablet Take 1 tablet (40 mg total) by mouth 2 (two) times daily. 11/08/19 02/14/20 Yes Harold Hedge, MD  SPIRIVA RESPIMAT 2.5 MCG/ACT AERS Inhale 2 puffs into the lungs daily as needed. 07/22/19  Yes [provider]  triamcinolone ointment (KENALOG) 0.5 % Apply 1 application topically 2 (two) times daily. 08/23/19  Yes [provider]  umeclidinium bromide (INCRUSE ELLIPTA) 62.5 MCG/INH AEPB Inhale 1 puff into the lungs daily.   Yes [provider]  budesonide-formoterol (SYMBICORT) 160-4.5 MCG/ACT inhaler Inhale 2 puffs into the lungs daily.  Patient not taking: Reported on 09/19/2020 07/02/19 09/19/20  [provider]  furosemide (LASIX) 40 MG tablet Take 1 tablet (40 mg total) by mouth 2 (two) times daily. Patient not taking: Reported on 09/19/2020 04/10/20   Alisa Graff, FNP  losartan (COZAAR) 100 MG tablet Take 100 mg by mouth daily. Patient not taking: Reported on 09/19/2020 07/27/19   [provider]  naloxone [facility]) 4 MG/0.1ML LIQD  nasal spray kit PLEASE SEE ATTACHED FOR DETAILED DIRECTIONS Patient not taking: Reported on 09/19/2020 08/18/18   [provider]  oxyCODONE (OXYCONTIN) 20 mg 12 hr tablet Take 20 mg by mouth every 12 (twelve) hours.  Patient not taking: No sig reported    [provider]  potassium chloride SA (KLOR-CON) 20 MEQ tablet Take 1 tablet (20 mEq total) by mouth daily. Patient not taking: Reported on 09/19/2020 04/17/20   Alisa Graff, FNP  pregabalin (LYRICA) 75 MG capsule Take 75 mg by mouth 2 (two) times daily. Patient not taking: Reported on 09/19/2020    [provider]  spironolactone (ALDACTONE) 100 MG tablet Take 100 mg by mouth daily. Patient not taking: Reported on 09/19/2020    [provider]    Review of Systems  Constitutional: Positive for appetite change (decreased) and fatigue (easily).  HENT: Positive for congestion. Negative for  postnasal drip and sore throat.   Eyes: Negative.   Respiratory: Positive for cough (productive) and shortness of breath (minimal). Negative for wheezing.   Cardiovascular: Positive for leg swelling (better). Negative for chest pain and palpitations.  Gastrointestinal: Positive for abdominal distention (better). Negative for abdominal pain.  Endocrine: Negative.   Genitourinary: Negative.   Musculoskeletal: Positive for arthralgias (legs) and back pain.  Skin: Negative.   Allergic/Immunologic: Negative.   Neurological: Negative for dizziness and light-headedness.  Hematological: Negative for adenopathy. Does not bruise/bleed easily.  Psychiatric/Behavioral: Positive for dysphoric mood. Negative for sleep disturbance (sleeping on 3 pillows; wearing oxygen @ 2L). The patient is not nervous/anxious.    Vitals:   09/19/20 1009  BP: 119/75  Pulse: 72  Resp: 18  SpO2: 96%  Weight: 193 lb 8 oz (87.8 kg)  Height: _0  (1.6 m)   Wt Readings from Last 3 Encounters:  09/19/20 193 lb 8 oz (87.8 kg)  09/12/20 265 lb (120.2 kg)  04/10/20 253 lb (114.8 kg)   Lab Results  Component Value Date   CREATININE 0.78 04/10/2020   CREATININE 0.83 01/26/2020   CREATININE 1.31 (H) 01/25/2020    Physical Exam Vitals and nursing note reviewed.  Constitutional:      Appearance: Normal appearance.  HENT:     Head: Normocephalic and atraumatic.  Eyes:     Comments:    Cardiovascular:     Rate and Rhythm: Normal rate and regular rhythm.  Pulmonary:     Effort: Pulmonary effort is normal. No respiratory distress.     Breath sounds: No wheezing or rales.  Abdominal:     General: There is no distension.     Palpations: Abdomen is soft.     Tenderness: There is no abdominal tenderness.  Musculoskeletal:        General: No tenderness.     Cervical back: Normal range of motion and neck supple.     Right lower leg: No tenderness. Edema (2+ pitting) present.     Left lower leg: No tenderness. Edema  (2+ pitting) present.  Skin:    General: Skin is warm and dry.  Neurological:     General: No focal deficit present.     Mental Status: She is alert and oriented to person, place, and time.  Psychiatric:        Mood and Affect: Mood normal.        Behavior: Behavior normal.        Thought Content: Thought content normal.    Assessment & Plan:  1: Chronic heart failure with preseved  ejection fraction without structural changes- - NYHA class III - euvolemic today - not weighing daily as she says that she has such uneven floors at home that a scale doesn't register correctly; says that she's waiting for a BP monitor/ scale that will transmit readings;  Instructed that when she starts weighing she can call for an overnight weight gain of > 2 pounds or a weekly weight gain of >5 pounds - weight down ~ 70 pounds from last visit here 1 week ago after recent admission - not adding salt  - she says that her losartan, spironolactone and diuretic are currently on hold due to her BP but she's unsure who told her that - will keep her off those medications for now as she looks euvolemic today with tremendous weight loss after recent admission - advised to start wearing compression socks daily with removal at bedtime - discussed referral to cardiology for possible cardio-mems but she would like to hold off on that for now - BNP 09/12/20 was 78 - PharmD reconciled medications with the patient  2: HTN- - BP looks good today although on the lower side (119/75) - followed with PCP ([facility]) but will be starting at [facility] at [facility] on 10/05/20 - BMP 09/15/20 reviewed and showed sodium 134, potassium 4.3, creatinine 0.97 and GFR 67 - plan on rechecking labs prior to reintroducing medications   3: DM- - A1c 01/22/20 was 6.9% - glucose at home this morning was 120  4: ILD- - saw pulmonology ([facility]) 03/17/20; returns 09/29/20 - receives rituximab infusion every 4 months although missed recent  infusion - 6 MWT completed 03/17/20 - smokes 1/4 ppd of cigarettes - wears oxygen at 2L at bedtime and during the day "sometimes" - wearing CPAP at bedtime   Medication bottles reviewed.   Return in 1 month or sooner for any questions/problems before then.

## 2020-09-21 ENCOUNTER — Ambulatory Visit: Payer: Medicaid Other | Admitting: Podiatry

## 2020-10-15 ENCOUNTER — Other Ambulatory Visit: Payer: Self-pay | Admitting: Family

## 2020-10-21 NOTE — Progress Notes (Signed)
Patient ID: [name], female    DOB: [birth year], 55 y.o.   MRN: [id number]   [name] is a 55 y/o female with a history of asthma, DM, hyperlipidemia, HTN, sleep apnea, ILD, hepatic cirrhosis, COPD, PTSD, current tobacco use and chronic heart failure.   Echo report from 09/13/20 reviewed and showed an EF of 55-60% with mild LAE. Echo report from 01/22/20 reviewed and showed an EF of 60-65%.  Admitted 09/12/20 due to HF exacerbation & cirrhosis. Liver doppler done. Initially given IV lasix with transition to oral diuretics with subsequent dose adjustment due to hypotension. Given a dose of IV iron. PT/ OT consults obtained. Discharged after 3 days.   She presents today for a follow-up visit with a chief complaint of moderate fatigue upon minimal exertion. She describes this as chronic in nature having been present for several years. She has associated head congestion, cough, shortness of breath, pedal edema, gradual weight gain and chronic pain along with this. She denies any palpitations, chest pain, difficulty sleeping or dizziness.   Has gotten established with pain management provider in [place] at [facility] and returns on 11/20/20.   Past Medical History:  Diagnosis Date   Asthma    CHF (congestive heart failure) (HCC)    COPD (chronic obstructive pulmonary disease) (HCC)    Diabetes mellitus without complication (HCC)    Diastolic dysfunction    a. 03/2018 Echo [facility]): EF >55%, Gr1 DD. Nl RV fxn.   Esophageal varices (HCC)    a. 10/2019 EGD: gr III esoph varices s/p banding.   Essential hypertension    GI bleed    HCV (hepatitis C virus)    Hematemesis    a. 10/2019 EGD: Gr III esoph varices, incompletely eradicated, banded. Non-bleeding ulcers tx w/ argon plasma coag. Single non-bleeding angiodysplastic lesion in duodenum tx w/ APC.   Hepatic cirrhosis (HCC)    a. h/o ETOH/Hep C.   Hypertension    Interstitial lung disease (HCC)    a. Followed @ [facility] pulm. Prev on  cellcept (caused GI side effects), Imuran (GI se), myfortic (elev LFTs). More recently on rituximab.   Mixed hyperlipidemia    Morbid obesity (HCC)    O2 dependent    PTSD (post-traumatic stress disorder)    Sleep apnea    Tobacco abuse    Past Surgical History:  Procedure Laterality Date   CHOLECYSTECTOMY     ESOPHAGOGASTRODUODENOSCOPY (EGD) WITH PROPOFOL N/A 11/04/2019   Procedure: ESOPHAGOGASTRODUODENOSCOPY (EGD) WITH PROPOFOL;  Surgeon: [name], MD;  Location: [facility] ENDOSCOPY;  Service: Endoscopy;  Laterality: N/A;   ORIF TIBIA PLATEAU Left 07/02/2015   Procedure: OPEN REDUCTION INTERNAL FIXATION (ORIF) TIBIAL PLATEAU;  Surgeon: Juanell Fairly, MD;  Location: [facility] ORS;  Service: Orthopedics;  Laterality: Left;   Family History  Problem Relation Age of Onset   Heart failure Mother    Social History   Tobacco Use   Smoking status: Every Day    Packs/day: 0.25    Years: 36.00    Pack years: 9.00    Types: Cigarettes   Smokeless tobacco: Never  Substance Use Topics   Alcohol use: Yes    Alcohol/week: 0.0 standard drinks    Comment: 2-3 drinks/wk. Prev heavy drinker.   Allergies  Allergen Reactions   Other Other (See Comments)    Gel capsules causes GI problems   Mycophenolate Sodium Other (See Comments)    transaminitis   Azathioprine Nausea Only   Mycophenolate Mofetil Nausea Only  Tacrolimus Diarrhea and Nausea Only     Review of Systems  Constitutional:  Positive for fatigue (easily). Negative for appetite change.  HENT:  Positive for congestion. Negative for postnasal drip and sore throat.   Eyes: Negative.   Respiratory:  Positive for cough (productive) and shortness of breath (minimal). Negative for wheezing.   Cardiovascular:  Positive for leg swelling (better). Negative for chest pain and palpitations.  Gastrointestinal:  Positive for abdominal distention (better). Negative for abdominal pain.  Endocrine: Negative.   Genitourinary: Negative.    Musculoskeletal:  Positive for arthralgias (legs) and back pain.  Skin: Negative.   Allergic/Immunologic: Negative.   Neurological:  Negative for dizziness and light-headedness.  Hematological:  Negative for adenopathy. Does not bruise/bleed easily.  Psychiatric/Behavioral:  Positive for dysphoric mood. Negative for sleep disturbance (sleeping on 3 pillows; wearing oxygen @ 2L). The patient is not nervous/anxious.    Vitals:   10/23/20 1256  BP: 120/78  Pulse: 96  Resp: 20  SpO2: 94%  Weight: 245 lb (111.1 kg)  Height: 5\' 1"  (1.549 m)   Wt Readings from Last 3 Encounters:  10/23/20 245 lb (111.1 kg)  09/19/20 193 lb 8 oz (87.8 kg)  09/12/20 265 lb (120.2 kg)   Lab Results  Component Value Date   CREATININE 0.78 04/10/2020   CREATININE 0.83 01/26/2020   CREATININE 1.31 (H) 01/25/2020   Physical Exam Vitals and nursing note reviewed.  Constitutional:      Appearance: Normal appearance.  HENT:     Head: Normocephalic and atraumatic.  Cardiovascular:     Rate and Rhythm: Normal rate and regular rhythm.  Pulmonary:     Effort: Pulmonary effort is normal. No respiratory distress.     Breath sounds: No wheezing or rales.  Abdominal:     General: There is no distension.     Palpations: Abdomen is soft.     Tenderness: There is no abdominal tenderness.  Musculoskeletal:        General: No tenderness.     Cervical back: Normal range of motion and neck supple.     Right lower leg: No tenderness. Edema (1+ pitting) present.     Left lower leg: No tenderness. Edema (1+ pitting) present.  Skin:    General: Skin is warm and dry.  Neurological:     General: No focal deficit present.     Mental Status: She is alert and oriented to person, place, and time.  Psychiatric:        Mood and Affect: Mood normal.        Behavior: Behavior normal.        Thought Content: Thought content normal.   Assessment & Plan:  1: Chronic heart failure with preseved ejection fraction without  structural changes- - NYHA class III - euvolemic today - weighing daily with her insurance provided scales and says that she's gradually gained weight and feels like her weight has normalized; reminded to call for an overnight weight gain of > 2 pounds or a weekly weight gain of >5 pounds - weight up 50 pounds from last visit here 1 month ago although question accuracy of last weight; patient says that by her scale, she has not gained 50 pounds - not adding salt  - discussed referral to cardiology for possible cardio-mems but she would like to hold off on that for now - BNP 09/12/20 was 78 - continues with home PT; home health nursing has finished  2: HTN- - BP looks good  today - follows with PCP ([facility])  - BMP 09/15/20 reviewed and showed sodium 134, potassium 4.3, creatinine 0.97 and GFR 67  3: DM- - A1c 01/22/20 was 6.9% - glucose at home yesterday was 124   4: ILD- - saw pulmonology ([facility]) 09/29/20 - receives rituximab infusion every 4 months  - 6 MWT completed 03/17/20 - smokes 1/4 ppd of cigarettes - wears oxygen at 2L at bedtime and during the day "sometimes" - wearing CPAP at bedtime   Patient did not bring her medications nor a list. Each medication was verbally reviewed with the patient and she was encouraged to bring the bottles to every visit to confirm accuracy of list.   Return in 3 months or sooner for any questions/problems before then.

## 2020-10-23 ENCOUNTER — Encounter: Payer: Self-pay | Admitting: Family

## 2020-10-23 ENCOUNTER — Ambulatory Visit: Payer: Medicaid Other | Attending: Family | Admitting: Family

## 2020-10-23 ENCOUNTER — Other Ambulatory Visit: Payer: Self-pay

## 2020-10-23 VITALS — BP 120/78 | HR 96 | Resp 20 | Ht 61.0 in | Wt 245.0 lb

## 2020-10-23 DIAGNOSIS — Z9981 Dependence on supplemental oxygen: Secondary | ICD-10-CM | POA: Insufficient documentation

## 2020-10-23 DIAGNOSIS — I5032 Chronic diastolic (congestive) heart failure: Secondary | ICD-10-CM

## 2020-10-23 DIAGNOSIS — G4730 Sleep apnea, unspecified: Secondary | ICD-10-CM | POA: Insufficient documentation

## 2020-10-23 DIAGNOSIS — Z6841 Body Mass Index (BMI) 40.0 and over, adult: Secondary | ICD-10-CM | POA: Insufficient documentation

## 2020-10-23 DIAGNOSIS — F17210 Nicotine dependence, cigarettes, uncomplicated: Secondary | ICD-10-CM | POA: Insufficient documentation

## 2020-10-23 DIAGNOSIS — I509 Heart failure, unspecified: Secondary | ICD-10-CM | POA: Diagnosis not present

## 2020-10-23 DIAGNOSIS — J849 Interstitial pulmonary disease, unspecified: Secondary | ICD-10-CM | POA: Insufficient documentation

## 2020-10-23 DIAGNOSIS — K7460 Unspecified cirrhosis of liver: Secondary | ICD-10-CM | POA: Diagnosis not present

## 2020-10-23 DIAGNOSIS — E782 Mixed hyperlipidemia: Secondary | ICD-10-CM | POA: Insufficient documentation

## 2020-10-23 DIAGNOSIS — Z8249 Family history of ischemic heart disease and other diseases of the circulatory system: Secondary | ICD-10-CM | POA: Insufficient documentation

## 2020-10-23 DIAGNOSIS — I10 Essential (primary) hypertension: Secondary | ICD-10-CM

## 2020-10-23 DIAGNOSIS — I110 Hypertensive heart disease with heart failure: Secondary | ICD-10-CM | POA: Diagnosis present

## 2020-10-23 DIAGNOSIS — J449 Chronic obstructive pulmonary disease, unspecified: Secondary | ICD-10-CM | POA: Insufficient documentation

## 2020-10-23 DIAGNOSIS — E119 Type 2 diabetes mellitus without complications: Secondary | ICD-10-CM | POA: Insufficient documentation

## 2020-10-23 MED ORDER — LOSARTAN POTASSIUM 100 MG PO TABS: 100.0000 mg | ORAL_TABLET | Freq: Every day | ORAL | 5 refills | Status: DC

## 2020-10-23 MED ORDER — SPIRONOLACTONE 100 MG PO TABS: 100.0000 mg | ORAL_TABLET | Freq: Every day | ORAL | 5 refills | Status: DC

## 2020-10-23 NOTE — Patient Instructions (Signed)
Continue weighing daily and call for an overnight weight gain of > 2 pounds or a weekly weight gain of >5 pounds. 

## 2020-10-31 ENCOUNTER — Telehealth: Payer: Self-pay | Admitting: Family

## 2020-10-31 NOTE — Telephone Encounter (Signed)
Patient called to report a gradual weight gain along with swelling in both of her lower legs. She says that this has been occurring over the last couple of days (recent holiday weekend). Reported weight on 10/12/20 was 248 pounds and on 10/18/20 was 255 pounds. Unsure if anything salty to eat recently.  Has been wearing her compression socks daily.   Takes spironolactone 100mg  daily and furosemide 40mg  BID "if she needs it". Does say for the last 2 days, she has been taking furosemide 40mg  BID. Advised patient to continue this for another couple of days and see if that helps.   She says that she sees her PCP at [facility] tomorrow and I've asked her to ask them to draw a BMP while she is there.   Patient verbalized understanding.

## 2020-11-06 ENCOUNTER — Other Ambulatory Visit: Payer: Self-pay | Admitting: Family

## 2020-11-06 MED ORDER — FUROSEMIDE 40 MG PO TABS: 40.0000 mg | ORAL_TABLET | Freq: Every day | ORAL | 3 refills | Status: DC

## 2020-11-15 ENCOUNTER — Other Ambulatory Visit: Payer: Self-pay | Admitting: Family

## 2020-11-16 ENCOUNTER — Other Ambulatory Visit
Admission: RE | Admit: 2020-11-16 | Discharge: 2020-11-16 | Disposition: A | Discharge status: Home / Self Care | Payer: Medicaid Other | Source: Ambulatory Visit | Attending: Family | Admitting: Family

## 2020-11-16 DIAGNOSIS — I5032 Chronic diastolic (congestive) heart failure: Secondary | ICD-10-CM | POA: Insufficient documentation

## 2020-11-16 LAB — BASIC METABOLIC PANEL
Anion gap: 8 (ref 5–15)
BUN: 15 mg/dL (ref 6–20)
CO2: 27 mmol/L (ref 22–32)
Calcium: 9 mg/dL (ref 8.9–10.3)
Chloride: 102 mmol/L (ref 98–111)
Creatinine, Ser: 0.8 mg/dL (ref 0.44–1.00)
GFR, Estimated: >60 mL/min (ref >60)
Glucose, Bld: 102 mg/dL — ABNORMAL HIGH (ref 70–99)
Potassium: 4 mmol/L (ref 3.5–5.1)
Sodium: 137 mmol/L (ref 135–145)

## 2021-01-23 ENCOUNTER — Ambulatory Visit: Payer: Medicaid Other | Admitting: Family

## 2021-01-23 ENCOUNTER — Telehealth: Payer: Self-pay | Admitting: Family

## 2021-01-23 NOTE — Telephone Encounter (Signed)
Patient did not show for her [facility] appointment on 01/23/21. Will attempt to reschedule.

## 2021-02-20 ENCOUNTER — Other Ambulatory Visit: Payer: Medicaid Other

## 2021-02-20 ENCOUNTER — Other Ambulatory Visit: Payer: Self-pay | Admitting: Physician Assistant

## 2021-02-20 ENCOUNTER — Ambulatory Visit
Admission: RE | Admit: 2021-02-20 | Discharge: 2021-02-20 | Disposition: A | Discharge status: Home / Self Care | Payer: Medicaid Other | Source: Ambulatory Visit | Attending: Physician Assistant | Admitting: Physician Assistant

## 2021-02-20 ENCOUNTER — Other Ambulatory Visit: Payer: Self-pay

## 2021-02-20 DIAGNOSIS — M5440 Lumbago with sciatica, unspecified side: Secondary | ICD-10-CM

## 2021-02-20 DIAGNOSIS — M25511 Pain in right shoulder: Secondary | ICD-10-CM

## 2021-02-28 ENCOUNTER — Other Ambulatory Visit: Payer: Self-pay | Admitting: Family

## 2021-03-08 ENCOUNTER — Other Ambulatory Visit: Payer: Self-pay | Admitting: Physician Assistant

## 2021-03-08 DIAGNOSIS — M545 Low back pain, unspecified: Secondary | ICD-10-CM

## 2021-04-03 ENCOUNTER — Inpatient Hospital Stay: Admission: RE | Admit: 2021-04-03 | Payer: Medicaid Other | Source: Ambulatory Visit

## 2021-05-27 ENCOUNTER — Other Ambulatory Visit: Payer: Self-pay | Admitting: Family

## 2021-07-03 ENCOUNTER — Other Ambulatory Visit: Payer: Self-pay | Admitting: Family Medicine

## 2021-07-03 DIAGNOSIS — R1905 Periumbilic swelling, mass or lump: Secondary | ICD-10-CM

## 2021-07-11 ENCOUNTER — Ambulatory Visit
Admission: RE | Admit: 2021-07-11 | Discharge: 2021-07-11 | Disposition: A | Discharge status: Home / Self Care | Payer: Medicaid Other | Source: Ambulatory Visit | Attending: Physician Assistant | Admitting: Physician Assistant

## 2021-07-11 ENCOUNTER — Other Ambulatory Visit: Payer: Self-pay

## 2021-07-11 DIAGNOSIS — M545 Low back pain, unspecified: Secondary | ICD-10-CM

## 2021-07-12 ENCOUNTER — Ambulatory Visit: Payer: Medicaid Other

## 2021-07-19 ENCOUNTER — Ambulatory Visit
Admission: RE | Admit: 2021-07-19 | Discharge: 2021-07-19 | Disposition: A | Discharge status: Home / Self Care | Payer: Medicaid Other | Source: Ambulatory Visit | Attending: Family Medicine | Admitting: Family Medicine

## 2021-07-19 ENCOUNTER — Other Ambulatory Visit: Payer: Self-pay

## 2021-07-19 DIAGNOSIS — R1905 Periumbilic swelling, mass or lump: Secondary | ICD-10-CM | POA: Diagnosis present

## 2021-08-14 ENCOUNTER — Ambulatory Visit: Payer: Medicaid Other | Attending: Family | Admitting: Family

## 2021-08-14 ENCOUNTER — Encounter: Payer: Self-pay | Admitting: Family

## 2021-08-14 VITALS — BP 103/70 | HR 91 | Resp 16 | Ht 63.0 in | Wt 228.5 lb

## 2021-08-14 DIAGNOSIS — I110 Hypertensive heart disease with heart failure: Secondary | ICD-10-CM | POA: Diagnosis not present

## 2021-08-14 DIAGNOSIS — F4310 Post-traumatic stress disorder, unspecified: Secondary | ICD-10-CM | POA: Insufficient documentation

## 2021-08-14 DIAGNOSIS — G4730 Sleep apnea, unspecified: Secondary | ICD-10-CM | POA: Diagnosis not present

## 2021-08-14 DIAGNOSIS — Z8619 Personal history of other infectious and parasitic diseases: Secondary | ICD-10-CM | POA: Insufficient documentation

## 2021-08-14 DIAGNOSIS — J449 Chronic obstructive pulmonary disease, unspecified: Secondary | ICD-10-CM | POA: Diagnosis not present

## 2021-08-14 DIAGNOSIS — Z720 Tobacco use: Secondary | ICD-10-CM | POA: Diagnosis not present

## 2021-08-14 DIAGNOSIS — X58XXXA Exposure to other specified factors, initial encounter: Secondary | ICD-10-CM | POA: Insufficient documentation

## 2021-08-14 DIAGNOSIS — K7460 Unspecified cirrhosis of liver: Secondary | ICD-10-CM | POA: Diagnosis not present

## 2021-08-14 DIAGNOSIS — I10 Essential (primary) hypertension: Secondary | ICD-10-CM

## 2021-08-14 DIAGNOSIS — Z9981 Dependence on supplemental oxygen: Secondary | ICD-10-CM | POA: Diagnosis not present

## 2021-08-14 DIAGNOSIS — Z9989 Dependence on other enabling machines and devices: Secondary | ICD-10-CM | POA: Diagnosis not present

## 2021-08-14 DIAGNOSIS — E119 Type 2 diabetes mellitus without complications: Secondary | ICD-10-CM | POA: Insufficient documentation

## 2021-08-14 DIAGNOSIS — J849 Interstitial pulmonary disease, unspecified: Secondary | ICD-10-CM | POA: Diagnosis not present

## 2021-08-14 DIAGNOSIS — Z79899 Other long term (current) drug therapy: Secondary | ICD-10-CM | POA: Diagnosis not present

## 2021-08-14 DIAGNOSIS — I5033 Acute on chronic diastolic (congestive) heart failure: Secondary | ICD-10-CM | POA: Diagnosis not present

## 2021-08-14 NOTE — Patient Instructions (Addendum)
Continue weighing daily and call for an overnight weight gain of 3 pounds or more or a weekly weight gain of more than 5 pounds. ? ? ?If you have voicemail, please make sure your mailbox is cleaned out so that we may leave a message and please make sure to listen to any voicemails.  ? ? ?Check and see if [facility] provider can give IV lasix if you are still swollen.  ?

## 2021-08-14 NOTE — Progress Notes (Signed)
? Patient ID: [name], female    DOB: [birth year], 56 y.o.   MRN: [id number] ? ? ?Ms Gudiel is a 56 y/o female with a history of asthma, DM, hyperlipidemia, HTN, sleep apnea, ILD, hepatic cirrhosis, COPD, PTSD, current tobacco use and chronic heart failure.  ? ?Echo report from 09/13/20 reviewed and showed an EF of 55-60% with mild LAE. Echo report from 01/22/20 reviewed and showed an EF of 60-65%. ? ?Admitted 02/23/21 due to acute on chronic respiratory failure. Recent rib fractures after a fall. Treated with prednisone, antibiotics and bipap NIV. Spironolactone held in setting of infection. She had asymptomatic bacteriuria. Discharged after 5 days.  ? ?She presents today for an urgent visit although hasn't been seen since June 2022. She presents with a chief complaint of worsening lower leg edema. She says that this started 4 days ago. She has associated weight gain of 5 pounds in the last week. She also has fatigue, cough, dizziness and intermittent cramping of hands/feet.  ? ?She says that she's had numerous medical appointments over the last several months. Doesn't think she's eaten any high salty foods recently and is trying to keep her daily fluid intake to 60-64 ounces.  ? ?Past Medical History:  ?Diagnosis Date  ? Asthma   ? CHF (congestive heart failure) ([place])   ? COPD (chronic obstructive pulmonary disease) ([place])   ? Diabetes mellitus without complication ([place])   ? Diastolic dysfunction   ? a. 03/2018 Echo [facility]): EF >55%, Gr1 DD. Nl RV fxn.  ? Esophageal varices (HCC)   ? a. 10/2019 EGD: gr III esoph varices s/p banding.  ? Essential hypertension   ? GI bleed   ? HCV (hepatitis C virus)   ? Hematemesis   ? a. 10/2019 EGD: Gr III esoph varices, incompletely eradicated, banded. Non-bleeding ulcers tx w/ argon plasma coag. Single non-bleeding angiodysplastic lesion in duodenum tx w/ APC.  ? Hepatic cirrhosis ([place])   ? a. h/o ETOH/Hep C.  ? Hypertension   ? Interstitial lung disease ([place])   ? a.  Followed @ [facility] pulm. Prev on cellcept (caused GI side effects), Imuran (GI se), myfortic (elev LFTs). More recently on rituximab.  ? Mixed hyperlipidemia   ? Morbid obesity ([place])   ? O2 dependent   ? PTSD (post-traumatic stress disorder)   ? Sleep apnea   ? Tobacco abuse   ? ?Past Surgical History:  ?Procedure Laterality Date  ? CHOLECYSTECTOMY    ? ESOPHAGOGASTRODUODENOSCOPY (EGD) WITH PROPOFOL N/A 11/04/2019  ? Procedure: ESOPHAGOGASTRODUODENOSCOPY (EGD) WITH PROPOFOL;  Surgeon: Lucilla Lame, MD;  Location: [facility] ENDOSCOPY;  Service: Endoscopy;  Laterality: N/A;  ? ORIF TIBIA PLATEAU Left 07/02/2015  ? Procedure: OPEN REDUCTION INTERNAL FIXATION (ORIF) TIBIAL PLATEAU;  Surgeon: Thornton Park, MD;  Location: [facility] ORS;  Service: Orthopedics;  Laterality: Left;  ? ?Family History  ?Problem Relation Age of Onset  ? Heart failure Mother   ? ?Social History  ? ?Tobacco Use  ? Smoking status: Every Day  ?  Packs/day: 0.25  ?  Years: 36.00  ?  Pack years: 9.00  ?  Types: Cigarettes  ? Smokeless tobacco: Never  ?Substance Use Topics  ? Alcohol use: Yes  ?  Alcohol/week: 0.0 standard drinks  ?  Comment: 2-3 drinks/wk. Prev heavy drinker.  ? ?Allergies  ?Allergen Reactions  ? Other Other (See Comments)  ?  Gel capsules causes GI problems  ? Mycophenolate Sodium Other (See Comments)  ?  transaminitis  ? Azathioprine  Nausea Only  ? Mycophenolate Mofetil Nausea Only  ? Tacrolimus Diarrhea and Nausea Only  ? ?Prior to Admission medications   ?Medication Sig Start Date End Date Taking? Authorizing Provider  ?albuterol (PROVENTIL HFA;VENTOLIN HFA) 108 (90 Base) MCG/ACT inhaler Inhale 1-2 puffs into the lungs every 6 (six) hours as needed for wheezing or shortness of breath.  03/10/13  Yes [provider]  ?albuterol (PROVENTIL) (2.5 MG/3ML) 0.083% nebulizer solution Take 2.5 mg by nebulization every 4 (four) hours as needed. 01/03/20  Yes [provider]  ?atorvastatin (LIPITOR) 10 MG tablet Take 10 mg by mouth daily.   04/24/18  Yes [provider]  ?budesonide-formoterol (SYMBICORT) 160-4.5 MCG/ACT inhaler Inhale 2 puffs into the lungs daily. 07/02/19  Yes [provider]  ?diclofenac Sodium (VOLTAREN) 1 % GEL Apply 1 application topically every 6 (six) hours as needed. 07/06/19  Yes [provider]  ?fluticasone (FLONASE) 50 MCG/ACT nasal spray Place 1 spray into both nostrils daily.   Yes [provider]  ?fluticasone furoate-vilanterol (BREO ELLIPTA) 200-25 MCG/INH AEPB Inhale 1 puff into the lungs daily.   Yes [provider]  ?furosemide (LASIX) 40 MG tablet Take 1 tablet (40 mg total) by mouth daily. 11/06/20  Yes Alisa Graff, FNP  ?losartan (COZAAR) 100 MG tablet Take 1 tablet (100 mg total) by mouth daily. 10/23/20  Yes Alisa Graff, FNP  ?montelukast (SINGULAIR) 10 MG tablet Take 10 mg by mouth at bedtime.  11/19/19  Yes [provider]  ?Oxycodone HCl 10 MG TABS Take 10 mg by mouth 4 (four) times daily.   Yes [provider]  ?pantoprazole (PROTONIX) 40 MG tablet Take 1 tablet (40 mg total) by mouth 2 (two) times daily. ?Patient taking differently: Take 40 mg by mouth daily. 11/08/19  Yes Harold Hedge, MD  ?pregabalin (LYRICA) 75 MG capsule Take 75 mg by mouth 3 (three) times daily.   Yes [provider]  ?SPIRIVA RESPIMAT 2.5 MCG/ACT AERS Inhale 2 puffs into the lungs daily as needed. 07/22/19  Yes [provider]  ?spironolactone (ALDACTONE) 100 MG tablet Take 1 tablet (100 mg total) by mouth daily. MUST MAKE APPT FOR FURTHER REFILLS 02/28/21  Yes Alisa Graff, FNP  ?triamcinolone ointment (KENALOG) 0.5 % Apply 1 application topically 2 (two) times daily. 08/23/19  Yes [provider]  ?umeclidinium bromide (INCRUSE ELLIPTA) 62.5 MCG/INH AEPB Inhale 1 puff into the lungs daily.   Yes [provider]  ?ENULOSE 10 GM/15ML SOLN Take 20 g by mouth daily as needed. 12/20/19   [provider]  ?escitalopram (LEXAPRO)  10 MG tablet Take 20 mg by mouth daily. 01/06/20   [provider]  ?naloxone (NARCAN) 4 MG/0.1ML LIQD nasal spray kit PLEASE SEE ATTACHED FOR DETAILED DIRECTIONS ?Patient not taking: Reported on 09/19/2020 08/18/18   [provider]  ? ? ?Review of Systems  ?Constitutional:  Positive for fatigue (easily). Negative for appetite change.  ?HENT:  Positive for congestion. Negative for postnasal drip and sore throat.   ?Eyes: Negative.   ?Respiratory:  Positive for cough (productive). Negative for shortness of breath and wheezing.   ?Cardiovascular:  Positive for leg swelling (worsening last 4 days). Negative for chest pain and palpitations.  ?Gastrointestinal:  Negative for abdominal distention and abdominal pain.  ?Endocrine: Negative.   ?Genitourinary: Negative.   ?Musculoskeletal:  Positive for arthralgias (legs) and back pain.  ?Skin: Negative.   ?Allergic/Immunologic: Negative.   ?Neurological:  Positive for dizziness. Negative  for light-headedness.  ?Hematological:  Negative for adenopathy. Does not bruise/bleed easily.  ?Psychiatric/Behavioral:  Positive for dysphoric mood and sleep disturbance (sleeping on 3 pillows; wearing oxygen @ 2L & CPAP). The patient is not nervous/anxious.   ? ?Vitals:  ? 08/14/21 1101  ?BP: 103/70  ?Pulse: 91  ?Resp: 16  ?SpO2: 95%  ?Weight: 228 lb 8 oz (103.6 kg)  ?Height: '5\' 3"'  (1.6 m)  ? ?Wt Readings from Last 3 Encounters:  ?08/14/21 228 lb 8 oz (103.6 kg)  ?10/23/20 245 lb (111.1 kg)  ?09/19/20 193 lb 8 oz (87.8 kg)  ? ?Lab Results  ?Component Value Date  ? CREATININE 0.80 11/16/2020  ? CREATININE 0.78 04/10/2020  ? CREATININE 0.83 01/26/2020  ? ?Physical Exam ?Vitals and nursing note reviewed.  ?Constitutional:   ?   Appearance: Normal appearance.  ?HENT:  ?   Head: Normocephalic and atraumatic.  ?Cardiovascular:  ?   Rate and Rhythm: Normal rate and regular rhythm.  ?Pulmonary:  ?   Effort: Pulmonary effort is normal. No respiratory distress.  ?   Breath sounds: No  wheezing or rales.  ?Abdominal:  ?   General: There is no distension.  ?   Palpations: Abdomen is soft.  ?   Tenderness: There is no abdominal tenderness.  ?Musculoskeletal:     ?   General: No tenderness

## 2021-09-13 ENCOUNTER — Ambulatory Visit: Payer: Medicaid Other | Admitting: Family

## 2021-09-21 ENCOUNTER — Encounter: Payer: Self-pay | Admitting: Family

## 2021-09-21 ENCOUNTER — Ambulatory Visit: Payer: Medicaid Other | Attending: Family | Admitting: Family

## 2021-09-21 VITALS — BP 105/68 | HR 82 | Resp 16 | Ht 63.0 in | Wt 229.2 lb

## 2021-09-21 DIAGNOSIS — I5032 Chronic diastolic (congestive) heart failure: Secondary | ICD-10-CM

## 2021-09-21 DIAGNOSIS — I110 Hypertensive heart disease with heart failure: Secondary | ICD-10-CM | POA: Insufficient documentation

## 2021-09-21 DIAGNOSIS — E119 Type 2 diabetes mellitus without complications: Secondary | ICD-10-CM | POA: Insufficient documentation

## 2021-09-21 DIAGNOSIS — F4310 Post-traumatic stress disorder, unspecified: Secondary | ICD-10-CM | POA: Diagnosis not present

## 2021-09-21 DIAGNOSIS — K7460 Unspecified cirrhosis of liver: Secondary | ICD-10-CM | POA: Insufficient documentation

## 2021-09-21 DIAGNOSIS — J849 Interstitial pulmonary disease, unspecified: Secondary | ICD-10-CM | POA: Insufficient documentation

## 2021-09-21 DIAGNOSIS — F17210 Nicotine dependence, cigarettes, uncomplicated: Secondary | ICD-10-CM | POA: Diagnosis not present

## 2021-09-21 DIAGNOSIS — G4730 Sleep apnea, unspecified: Secondary | ICD-10-CM | POA: Insufficient documentation

## 2021-09-21 DIAGNOSIS — I509 Heart failure, unspecified: Secondary | ICD-10-CM | POA: Diagnosis present

## 2021-09-21 DIAGNOSIS — J45909 Unspecified asthma, uncomplicated: Secondary | ICD-10-CM | POA: Insufficient documentation

## 2021-09-21 DIAGNOSIS — J449 Chronic obstructive pulmonary disease, unspecified: Secondary | ICD-10-CM | POA: Diagnosis not present

## 2021-09-21 DIAGNOSIS — Z79899 Other long term (current) drug therapy: Secondary | ICD-10-CM | POA: Diagnosis not present

## 2021-09-21 DIAGNOSIS — F17200 Nicotine dependence, unspecified, uncomplicated: Secondary | ICD-10-CM

## 2021-09-21 DIAGNOSIS — I10 Essential (primary) hypertension: Secondary | ICD-10-CM

## 2021-09-21 DIAGNOSIS — E785 Hyperlipidemia, unspecified: Secondary | ICD-10-CM | POA: Insufficient documentation

## 2021-09-21 MED ORDER — TORSEMIDE 20 MG PO TABS: 40.0000 mg | ORAL_TABLET | Freq: Every day | ORAL | 5 refills | Status: DC

## 2021-09-21 NOTE — Progress Notes (Signed)
Patient ID: [name], female    DOB: [birth year], 56 y.o.   MRN: [id number]   [name] is a 56 y/o female with a history of asthma, DM, hyperlipidemia, HTN, sleep apnea, ILD, hepatic cirrhosis, COPD, PTSD, current tobacco use and chronic heart failure.   Echo report from 09/13/20 reviewed and showed an EF of 55-60% with mild LAE. Echo report from 01/22/20 reviewed and showed an EF of 60-65%.  Admitted 02/23/21 due to acute on chronic respiratory failure. Recent rib fractures after a fall. Treated with prednisone, antibiotics and bipap NIV. Spironolactone held in setting of infection. She had asymptomatic bacteriuria. Discharged after 5 days.   She presents today for a follow-up visit with a chief complaint of moderate fatigue upon minimal exertion. She describes this as chronic in nature having been present for several years. She has associated cough, pedal edema, abdominal pain, chronic difficulty sleeping and sore throat/ trouble swallowing since her recent GI procedures. She denies any dizziness, abdominal distention, palpitations, chest pain, shortness of breath, wheezing or weight gain.   Wearing compression socks but swelling persists.   Is now committed to stopping smoking so that she can get her abdominal hernia surgery done. Has a quit day of after Memorial Day.   Past Medical History:  Diagnosis Date   Asthma    CHF (congestive heart failure) (HCC)    COPD (chronic obstructive pulmonary disease) ([place])    Diabetes mellitus without complication ([place])    Diastolic dysfunction    a. 03/2018 Echo [facility]): EF >55%, Gr1 DD. Nl RV fxn.   Esophageal varices ([place])    a. 10/2019 EGD: gr III esoph varices s/p banding.   Essential hypertension    GI bleed    HCV (hepatitis C virus)    Hematemesis    a. 10/2019 EGD: Gr III esoph varices, incompletely eradicated, banded. Non-bleeding ulcers tx w/ argon plasma coag. Single non-bleeding angiodysplastic lesion in duodenum tx w/ APC.    Hepatic cirrhosis (HCC)    a. h/o ETOH/Hep C.   Hypertension    Interstitial lung disease ([place])    a. Followed @ [facility] pulm. Prev on cellcept (caused GI side effects), Imuran (GI se), myfortic (elev LFTs). More recently on rituximab.   Mixed hyperlipidemia    Morbid obesity (HCC)    O2 dependent    PTSD (post-traumatic stress disorder)    Sleep apnea    Tobacco abuse    Past Surgical History:  Procedure Laterality Date   CHOLECYSTECTOMY     ESOPHAGOGASTRODUODENOSCOPY (EGD) WITH PROPOFOL N/A 11/04/2019   Procedure: ESOPHAGOGASTRODUODENOSCOPY (EGD) WITH PROPOFOL;  Surgeon: Lucilla Lame, MD;  Location: [facility] ENDOSCOPY;  Service: Endoscopy;  Laterality: N/A;   ORIF TIBIA PLATEAU Left 07/02/2015   Procedure: OPEN REDUCTION INTERNAL FIXATION (ORIF) TIBIAL PLATEAU;  Surgeon: Thornton Park, MD;  Location: [facility] ORS;  Service: Orthopedics;  Laterality: Left;   Family History  Problem Relation Age of Onset   Heart failure Mother    Social History   Tobacco Use   Smoking status: Every Day    Packs/day: 0.25    Years: 36.00    Pack years: 9.00    Types: Cigarettes   Smokeless tobacco: Never  Substance Use Topics   Alcohol use: Yes    Alcohol/week: 0.0 standard drinks    Comment: 2-3 drinks/wk. Prev heavy drinker.   Allergies  Allergen Reactions   Other Other (See Comments)    Gel capsules causes GI problems   Mycophenolate Sodium Other (  See Comments)    transaminitis   Azathioprine Nausea Only   Mycophenolate Mofetil Nausea Only   Tacrolimus Diarrhea and Nausea Only   Prior to Admission medications   Medication Sig Start Date End Date Taking? Authorizing Provider  albuterol (PROVENTIL HFA;VENTOLIN HFA) 108 (90 Base) MCG/ACT inhaler Inhale 1-2 puffs into the lungs every 6 (six) hours as needed for wheezing or shortness of breath.  03/10/13  Yes [provider]  albuterol (PROVENTIL) (2.5 MG/3ML) 0.083% nebulizer solution Take 2.5 mg by nebulization every 4 (four) hours as  needed. 01/03/20  Yes [provider]  atorvastatin (LIPITOR) 10 MG tablet Take 10 mg by mouth daily.  04/24/18  Yes [provider]  diclofenac Sodium (VOLTAREN) 1 % GEL Apply 1 application topically every 6 (six) hours as needed. 07/06/19  Yes [provider]  ENULOSE 10 GM/15ML SOLN Take 20 g by mouth daily as needed. 12/20/19  Yes [provider]  escitalopram (LEXAPRO) 10 MG tablet Take 20 mg by mouth daily. 01/06/20  Yes [provider]  fluticasone (FLONASE) 50 MCG/ACT nasal spray Place 1 spray into both nostrils daily.   Yes [provider]  furosemide (LASIX) 40 MG tablet Take 1 tablet (40 mg total) by mouth daily. 11/06/20  Yes Jerelyn Trimarco, Otila Kluver A, FNP  losartan (COZAAR) 100 MG tablet Take 1 tablet (100 mg total) by mouth daily. 10/23/20  Yes Dashan Chizmar A, FNP  montelukast (SINGULAIR) 10 MG tablet Take 10 mg by mouth at bedtime.  11/19/19  Yes [provider]  naloxone Karma Greaser) 4 MG/0.1ML LIQD nasal spray kit  08/18/18  Yes [provider]  Oxycodone HCl 10 MG TABS Take 10 mg by mouth 4 (four) times daily.   Yes [provider]  pantoprazole (PROTONIX) 40 MG tablet Take 1 tablet (40 mg total) by mouth 2 (two) times daily. Patient taking differently: Take 40 mg by mouth daily. 11/08/19  Yes Harold Hedge, MD  pregabalin (LYRICA) 75 MG capsule Take 75 mg by mouth 3 (three) times daily.   Yes [provider]  spironolactone (ALDACTONE) 100 MG tablet Take 1 tablet (100 mg total) by mouth daily. MUST MAKE APPT FOR FURTHER REFILLS 02/28/21  Yes Darylene Price A, FNP  TRELEGY ELLIPTA 100-62.5-25 MCG/ACT AEPB Inhale 1 puff into the lungs daily. 04/13/21  Yes [provider]  triamcinolone ointment (KENALOG) 0.5 % Apply 1 application topically 2 (two) times daily. 08/23/19  Yes [provider]  budesonide-formoterol (SYMBICORT) 160-4.5 MCG/ACT inhaler Inhale 2 puffs into the lungs daily. Patient not  taking: Reported on 09/21/2021 07/02/19   [provider]  fluticasone furoate-vilanterol (BREO ELLIPTA) 200-25 MCG/INH AEPB Inhale 1 puff into the lungs daily. Patient not taking: Reported on 09/21/2021    [provider]  SPIRIVA RESPIMAT 2.5 MCG/ACT AERS Inhale 2 puffs into the lungs daily as needed. Patient not taking: Reported on 09/21/2021 07/22/19   [provider]  umeclidinium bromide (INCRUSE ELLIPTA) 62.5 MCG/INH AEPB Inhale 1 puff into the lungs daily. Patient not taking: Reported on 09/21/2021    [provider]     Review of Systems  Constitutional:  Positive for fatigue (easily). Negative for appetite change.  HENT:  Positive for congestion, sore throat (since endoscopy) and trouble swallowing (since endoscopy). Negative for postnasal drip.   Eyes: Negative.   Respiratory:  Positive for cough (productive). Negative for shortness of breath and wheezing.   Cardiovascular:  Positive for leg swelling. Negative for chest pain and  palpitations.  Gastrointestinal:  Positive for abdominal pain (intermittent since endoscopy). Negative for abdominal distention.  Endocrine: Negative.   Genitourinary: Negative.   Musculoskeletal:  Positive for arthralgias (legs) and back pain.  Skin: Negative.   Allergic/Immunologic: Negative.   Neurological:  Negative for dizziness and light-headedness.  Hematological:  Negative for adenopathy. Does not bruise/bleed easily.  Psychiatric/Behavioral:  Positive for dysphoric mood and sleep disturbance (sleeping on 3 pillows; wearing oxygen @ 2L & CPAP). The patient is not nervous/anxious.    Vitals:   09/21/21 1153  BP: 105/68  Pulse: 82  Resp: 16  SpO2: 98%  Weight: 229 lb 4 oz (104 kg)  Height: '5\' 3"'  (1.6 m)   Wt Readings from Last 3 Encounters:  09/21/21 229 lb 4 oz (104 kg)  08/14/21 228 lb 8 oz (103.6 kg)  10/23/20 245 lb (111.1 kg)   Lab Results  Component Value Date   CREATININE 0.80 11/16/2020    CREATININE 0.78 04/10/2020   CREATININE 0.83 01/26/2020   Physical Exam Vitals and nursing note reviewed.  Constitutional:      Appearance: Normal appearance.  HENT:     Head: Normocephalic and atraumatic.  Cardiovascular:     Rate and Rhythm: Normal rate and regular rhythm.  Pulmonary:     Effort: Pulmonary effort is normal. No respiratory distress.     Breath sounds: No wheezing or rales.  Abdominal:     General: There is no distension.     Palpations: Abdomen is soft.     Tenderness: There is no abdominal tenderness.  Musculoskeletal:        General: No tenderness.     Cervical back: Normal range of motion and neck supple.     Right lower leg: No tenderness. Edema (2+ pitting) present.     Left lower leg: No tenderness. Edema (2+ pitting) present.  Skin:    General: Skin is warm and dry.  Neurological:     General: No focal deficit present.     Mental Status: She is alert and oriented to person, place, and time.  Psychiatric:        Mood and Affect: Mood normal.        Behavior: Behavior normal.        Thought Content: Thought content normal.   Assessment & Plan:  1: Chronic heart failure with preseved ejection fraction without structural changes- - NYHA class III - continued fluid overloaded today with pitting edema to knees although lungs CTA and weight stable - weighing daily; reminded to call for an overnight weight gain of > 2 pounds or a weekly weight gain of >5 pounds - weight stable from last visit here 5 weeks ago - will stop furosemide and begin torsemide 46m daily; she was instructed to take 2 of the 213mtorsemide tablets daily but if she started urinating too much, she can decrease to 1 tablet daily - check BMP next visit - discussed using furoscix PRN but will see how she responds to torsemide; gave QR code to patient so she can watch the furoscix video at home before her next appointment - continue wearing compression socks daily with removal at  bedtime - BNP 02/24/21 was 70  2: HTN- - BP looks good (105/68) - [facility] - BMP 08/15/21 reviewed and showed sodium 135, potassium 4.2, creatinine 0.64 and GFR >90  3: DM- - A1c 07/27/21 was 5.5% - no longer on insulin nor checking her glucose at home  4:  ILD- - saw pulmonology ([place]) 04/13/21 - receives rituximab infusion every 4 months  - wearing CPAP at bedtime  5: Tobacco use- - smoking 4 cigarettes daily - committed to quitting after Memorial Day is over - wants to now quit so she can have her abdominal hernia surgery - tobacco cessation discussed for 3 minutes with her   Patient did not bring her medications nor a list. Each medication was verbally reviewed with the patient and she was encouraged to bring the bottles to every visit to confirm accuracy of list.   Return in 2 weeks, sooner if needed

## 2021-09-21 NOTE — Patient Instructions (Addendum)
Continue weighing daily and call for an overnight weight gain of 3 pounds or more or a weekly weight gain of more than 5 pounds.   Bring ALL medications including any over the counter or vitamins to every visit.    Start taking torsemide 40mg  every morning. You will take 2 tablets every morning. When you start taking the torsemide, you will no longer take the furosemide.

## 2021-10-09 ENCOUNTER — Ambulatory Visit: Payer: Medicaid Other | Admitting: Family

## 2021-10-18 ENCOUNTER — Other Ambulatory Visit: Payer: Self-pay | Admitting: Family

## 2021-11-01 ENCOUNTER — Ambulatory Visit: Payer: Medicaid Other | Admitting: Family

## 2021-11-02 ENCOUNTER — Telehealth: Payer: Self-pay | Admitting: Family

## 2021-11-02 ENCOUNTER — Ambulatory Visit: Payer: Self-pay | Admitting: Family

## 2021-11-02 NOTE — Telephone Encounter (Signed)
Patient did not show for her [facility] appointment on 11/02/21. Will attempt to reschedule.   

## 2021-11-02 NOTE — Progress Notes (Deleted)
[name] ID: [name], female    DOB: [birth year], 56 y.o.   MRN: [id number]   [name] is a 56 y/o female with a history of asthma, DM, hyperlipidemia, HTN, sleep apnea, ILD, hepatic cirrhosis, COPD, PTSD, current tobacco use and chronic heart failure.   Echo report from 09/13/20 reviewed and showed an EF of 55-60% with mild LAE. Echo report from 01/22/20 reviewed and showed an EF of 60-65%.  Was in [facility] 09/23/21 due to abdominal pain due to gastritis & esophagitis. Colonoscopy/endoscopy done 4 days prior. Pantoprazole increased to BID and she was released.                                           She presents today for a follow-up visit with a chief complaint of   Past Medical History:  Diagnosis Date   Asthma    CHF (congestive heart failure) (HCC)    COPD (chronic obstructive pulmonary disease) (HCC)    Diabetes mellitus without complication (HCC)    Diastolic dysfunction    a. 03/2018 Echo [facility]): EF >55%, Gr1 DD. Nl RV fxn.   Esophageal varices (HCC)    a. 10/2019 EGD: gr III esoph varices s/p banding.   Essential hypertension    GI bleed    HCV (hepatitis C virus)    Hematemesis    a. 10/2019 EGD: Gr III esoph varices, incompletely eradicated, banded. Non-bleeding ulcers tx w/ argon plasma coag. Single non-bleeding angiodysplastic lesion in duodenum tx w/ APC.   Hepatic cirrhosis (HCC)    a. h/o ETOH/Hep C.   Hypertension    Interstitial lung disease (HCC)    a. Followed @ [facility] pulm. Prev on cellcept (caused GI side effects), Imuran (GI se), myfortic (elev LFTs). More recently on rituximab.   Mixed hyperlipidemia    Morbid obesity (HCC)    O2 dependent    PTSD (post-traumatic stress disorder)    Sleep apnea    Tobacco abuse    Past Surgical History:  Procedure Laterality Date   CHOLECYSTECTOMY     ESOPHAGOGASTRODUODENOSCOPY (EGD) WITH PROPOFOL N/A 11/04/2019   Procedure: ESOPHAGOGASTRODUODENOSCOPY (EGD) WITH PROPOFOL;  Surgeon: [name], MD;  Location: [facility]  ENDOSCOPY;  Service: Endoscopy;  Laterality: N/A;   ORIF TIBIA PLATEAU Left 07/02/2015   Procedure: OPEN REDUCTION INTERNAL FIXATION (ORIF) TIBIAL PLATEAU;  Surgeon: Juanell Fairly, MD;  Location: [facility] ORS;  Service: Orthopedics;  Laterality: Left;   Family History  Problem Relation Age of Onset   Heart failure Mother    Social History   Tobacco Use   Smoking status: Every Day    Packs/day: 0.25    Years: 36.00    Total pack years: 9.00    Types: Cigarettes   Smokeless tobacco: Never  Substance Use Topics   Alcohol use: Yes    Alcohol/week: 0.0 standard drinks of alcohol    Comment: 2-3 drinks/wk. Prev heavy drinker.   Allergies  Allergen Reactions   Other Other (See Comments)    Gel capsules causes GI problems   Citrated Iron     GI upset   Gelatin    Mycophenolate Sodium Other (See Comments)    transaminitis   Azathioprine Nausea Only   Mycophenolate Mofetil Nausea Only   Tacrolimus Diarrhea and Nausea Only      Review of Systems  Constitutional:  Positive for fatigue (easily). Negative for  appetite change.  HENT:  Positive for congestion, sore throat (since endoscopy) and trouble swallowing (since endoscopy). Negative for postnasal drip.   Eyes: Negative.   Respiratory:  Positive for cough (productive). Negative for shortness of breath and wheezing.   Cardiovascular:  Positive for leg swelling. Negative for chest pain and palpitations.  Gastrointestinal:  Positive for abdominal pain (intermittent since endoscopy). Negative for abdominal distention.  Endocrine: Negative.   Genitourinary: Negative.   Musculoskeletal:  Positive for arthralgias (legs) and back pain.  Skin: Negative.   Allergic/Immunologic: Negative.   Neurological:  Negative for dizziness and light-headedness.  Hematological:  Negative for adenopathy. Does not bruise/bleed easily.  Psychiatric/Behavioral:  Positive for dysphoric mood and sleep disturbance (sleeping on 3 pillows; wearing oxygen @ 2L &  CPAP). [name] is not nervous/anxious.       Physical Exam Vitals and nursing note reviewed.  Constitutional:      Appearance: Normal appearance.  HENT:     Head: Normocephalic and atraumatic.  Cardiovascular:     Rate and Rhythm: Normal rate and regular rhythm.  Pulmonary:     Effort: Pulmonary effort is normal. No respiratory distress.     Breath sounds: No wheezing or rales.  Abdominal:     General: There is no distension.     Palpations: Abdomen is soft.     Tenderness: There is no abdominal tenderness.  Musculoskeletal:        General: No tenderness.     Cervical back: Normal range of motion and neck supple.     Right lower leg: No tenderness. Edema (2+ pitting) present.     Left lower leg: No tenderness. Edema (2+ pitting) present.  Skin:    General: Skin is warm and dry.  Neurological:     General: No focal deficit present.     Mental Status: She is alert and oriented to person, place, and time.  Psychiatric:        Mood and Affect: Mood normal.        Behavior: Behavior normal.        Thought Content: Thought content normal.    Assessment & Plan:  1: Chronic heart failure with preseved ejection fraction without structural changes- - NYHA class III - continued fluid overloaded today with pitting edema to knees although lungs CTA and weight stable - weighing daily; reminded to call for an overnight weight gain of > 2 pounds or a weekly weight gain of >5 pounds - weight 229.4 from last visit here 5 weeks ago  - check BMP today since diuretic had been changed to torsemide at her last visit - discussed using furoscix PRN but will see how she responds to torsemide; gave QR code to [name] so she can watch [facility] furoscix video at home before her next appointment - continue wearing compression socks daily with removal at bedtime - BNP 02/24/21 was 70  2: HTN- - BP  - saw PCP @ [facility] 07/27/21 - BMP 09/23/21 reviewed and showed sodium 140, potassium 4.5, creatinine  0.82 and GFR 84  3: DM- - A1c 07/27/21 was 5.5% - no longer on insulin nor checking her glucose at home  4: ILD- - saw pulmonology ([facility]) 10/12/21 - receives rituximab infusion every 4 months  - wearing CPAP at bedtime  5: Tobacco use- - smoking 4 cigarettes daily - committed to quitting after [place] Day is over - wants to now quit so she can have her abdominal hernia surgery - tobacco cessation discussed for  3 minutes with her   [name] did not bring her medications nor a list. Each medication was verbally reviewed with [name] and she was encouraged to bring [facility] bottles to every visit to confirm accuracy of list.

## 2021-11-06 ENCOUNTER — Other Ambulatory Visit: Payer: Self-pay | Admitting: Family

## 2021-11-06 MED ORDER — LOSARTAN POTASSIUM 100 MG PO TABS
50.0000 mg | ORAL_TABLET | Freq: Every day | ORAL | 3 refills | Status: DC
Start: 2021-11-06 — End: 2022-01-31

## 2021-11-06 NOTE — Progress Notes (Signed)
Losartan decreased by [facility] provider due to hypotesion

## 2021-11-15 ENCOUNTER — Encounter: Payer: Self-pay | Admitting: Family

## 2021-11-15 ENCOUNTER — Ambulatory Visit: Payer: Medicaid Other | Attending: Family | Admitting: Family

## 2021-11-15 VITALS — BP 98/69 | HR 88 | Resp 18 | Ht 60.0 in | Wt 226.4 lb

## 2021-11-15 DIAGNOSIS — F17210 Nicotine dependence, cigarettes, uncomplicated: Secondary | ICD-10-CM | POA: Insufficient documentation

## 2021-11-15 DIAGNOSIS — E119 Type 2 diabetes mellitus without complications: Secondary | ICD-10-CM | POA: Diagnosis not present

## 2021-11-15 DIAGNOSIS — J849 Interstitial pulmonary disease, unspecified: Secondary | ICD-10-CM

## 2021-11-15 DIAGNOSIS — K469 Unspecified abdominal hernia without obstruction or gangrene: Secondary | ICD-10-CM | POA: Insufficient documentation

## 2021-11-15 DIAGNOSIS — I110 Hypertensive heart disease with heart failure: Secondary | ICD-10-CM | POA: Diagnosis not present

## 2021-11-15 DIAGNOSIS — G4730 Sleep apnea, unspecified: Secondary | ICD-10-CM | POA: Insufficient documentation

## 2021-11-15 DIAGNOSIS — R5383 Other fatigue: Secondary | ICD-10-CM | POA: Insufficient documentation

## 2021-11-15 DIAGNOSIS — K7460 Unspecified cirrhosis of liver: Secondary | ICD-10-CM | POA: Insufficient documentation

## 2021-11-15 DIAGNOSIS — I10 Essential (primary) hypertension: Secondary | ICD-10-CM | POA: Diagnosis not present

## 2021-11-15 DIAGNOSIS — E785 Hyperlipidemia, unspecified: Secondary | ICD-10-CM | POA: Diagnosis not present

## 2021-11-15 DIAGNOSIS — G8929 Other chronic pain: Secondary | ICD-10-CM | POA: Insufficient documentation

## 2021-11-15 DIAGNOSIS — J45909 Unspecified asthma, uncomplicated: Secondary | ICD-10-CM | POA: Insufficient documentation

## 2021-11-15 DIAGNOSIS — J449 Chronic obstructive pulmonary disease, unspecified: Secondary | ICD-10-CM | POA: Diagnosis not present

## 2021-11-15 DIAGNOSIS — I5033 Acute on chronic diastolic (congestive) heart failure: Secondary | ICD-10-CM

## 2021-11-15 DIAGNOSIS — Z79899 Other long term (current) drug therapy: Secondary | ICD-10-CM | POA: Insufficient documentation

## 2021-11-15 DIAGNOSIS — F17200 Nicotine dependence, unspecified, uncomplicated: Secondary | ICD-10-CM

## 2021-11-15 MED ORDER — POTASSIUM CHLORIDE CRYS ER 20 MEQ PO TBCR: 20.0000 meq | EXTENDED_RELEASE_TABLET | Freq: Every day | ORAL | 3 refills | Status: DC

## 2021-11-15 MED ORDER — POTASSIUM CHLORIDE CRYS ER 20 MEQ PO TBCR: 40.0000 meq | EXTENDED_RELEASE_TABLET | Freq: Every day | ORAL | 0 refills | Status: DC

## 2021-11-15 NOTE — Progress Notes (Signed)
Patient ID: [name], female    DOB: 12/19/1965, 56 y.o.   MRN: [id number]   Ms [name] is a 56 y/o female with a history of asthma, DM, hyperlipidemia, HTN, sleep apnea, ILD, hepatic cirrhosis, COPD, PTSD, current tobacco use and chronic heart failure.   Echo report from 09/13/20 reviewed and showed an EF of 55-60% with mild LAE. Echo report from 01/22/20 reviewed and showed an EF of 60-65%.  Was in the ED 09/23/21 due to abdominal pain due to gastritis & esophagitis. Colonoscopy/endoscopy done 4 days prior. Pantoprazole increased to BID and she was released.                                           She presents today for a follow-up visit with a chief complaint of moderate fatigue with minimal exertion. She describes this as chronic in nature. She has associated head congestion, cough, wheezing, pedal edema, abdominal distention, nausea, difficulty sleeping, chronic pain and fluctuating weight along with this. She denies any dizziness, palpitations, chest pain or shortness of breath.   She watched the furoscix video and is very interested in trying this. She says that her weight went up from 220 pounds up to 229 pounds.  Past Medical History:  Diagnosis Date   Asthma    CHF (congestive heart failure) (HCC)    COPD (chronic obstructive pulmonary disease) ([place])    Diabetes mellitus without complication ([place])    Diastolic dysfunction    a. 03/2018 Echo [facility]): EF >55%, Gr1 DD. Nl RV fxn.   Esophageal varices ([place])    a. 10/2019 EGD: gr III esoph varices s/p banding.   Essential hypertension    GI bleed    HCV (hepatitis C virus)    Hematemesis    a. 10/2019 EGD: Gr III esoph varices, incompletely eradicated, banded. Non-bleeding ulcers tx w/ argon plasma coag. Single non-bleeding angiodysplastic lesion in duodenum tx w/ APC.   Hepatic cirrhosis (HCC)    a. h/o ETOH/Hep C.   Hypertension    Interstitial lung disease ([place])    a. Followed @ [facility] pulm. Prev on cellcept (caused GI side  effects), Imuran (GI se), myfortic (elev LFTs). More recently on rituximab.   Mixed hyperlipidemia    Morbid obesity (HCC)    O2 dependent    PTSD (post-traumatic stress disorder)    Sleep apnea    Tobacco abuse    Past Surgical History:  Procedure Laterality Date   CHOLECYSTECTOMY     ESOPHAGOGASTRODUODENOSCOPY (EGD) WITH PROPOFOL N/A 11/04/2019   Procedure: ESOPHAGOGASTRODUODENOSCOPY (EGD) WITH PROPOFOL;  Surgeon: Lucilla Lame, MD;  Location: [facility] ENDOSCOPY;  Service: Endoscopy;  Laterality: N/A;   ORIF TIBIA PLATEAU Left 07/02/2015   Procedure: OPEN REDUCTION INTERNAL FIXATION (ORIF) TIBIAL PLATEAU;  Surgeon: Thornton Park, MD;  Location: [facility] ORS;  Service: Orthopedics;  Laterality: Left;   Family History  Problem Relation Age of Onset   Heart failure Mother    Social History   Tobacco Use   Smoking status: Every Day    Packs/day: 0.25    Years: 36.00    Total pack years: 9.00    Types: Cigarettes   Smokeless tobacco: Never  Substance Use Topics   Alcohol use: Yes    Alcohol/week: 0.0 standard drinks of alcohol    Comment: 2-3 drinks/wk. Prev heavy drinker.   Allergies  Allergen Reactions  Other Other (See Comments)    Gel capsules causes GI problems   Citrated Iron     GI upset   Gelatin    Mycophenolate Sodium Other (See Comments)    transaminitis   Azathioprine Nausea Only   Mycophenolate Mofetil Nausea Only   Tacrolimus Diarrhea and Nausea Only   Prior to Admission medications   Medication Sig Start Date End Date Taking? Authorizing Provider  albuterol (PROVENTIL HFA;VENTOLIN HFA) 108 (90 Base) MCG/ACT inhaler Inhale 1-2 puffs into the lungs every 6 (six) hours as needed for wheezing or shortness of breath. 03/10/13  Yes [provider]  albuterol (PROVENTIL) (2.5 MG/3ML) 0.083% nebulizer solution Take 2.5 mg by nebulization every 4 (four) hours as needed. 01/03/20  Yes [provider]  atorvastatin (LIPITOR) 10 MG tablet Take 10 mg by mouth  daily.  04/24/18  Yes [provider]  budesonide-formoterol (SYMBICORT) 160-4.5 MCG/ACT inhaler Inhale 2 puffs into the lungs daily. 07/02/19  Yes [provider]  carvedilol (COREG) 3.125 MG tablet Take 3.125 mg by mouth daily.   Yes [provider]  diclofenac Sodium (VOLTAREN) 1 % GEL Apply 1 application topically every 6 (six) hours as needed. 07/06/19  Yes [provider]  fluticasone (FLONASE) 50 MCG/ACT nasal spray Place 1 spray into both nostrils daily.   Yes [provider]  fluticasone furoate-vilanterol (BREO ELLIPTA) 200-25 MCG/INH AEPB Inhale 1 puff into the lungs daily.   Yes [provider]  losartan (COZAAR) 100 MG tablet Take 0.5 tablets (50 mg total) by mouth daily. 11/06/21  Yes Sharlynn Seckinger A, FNP  montelukast (SINGULAIR) 10 MG tablet Take 10 mg by mouth at bedtime.  11/19/19  Yes [provider]  naloxone Karma Greaser) 4 MG/0.1ML LIQD nasal spray kit  08/18/18  Yes [provider]  Oxycodone HCl 10 MG TABS Take 10 mg by mouth 4 (four) times daily.   Yes [provider]  pantoprazole (PROTONIX) 40 MG tablet Take 1 tablet (40 mg total) by mouth 2 (two) times daily. Patient taking differently: Take 40 mg by mouth daily. 11/08/19  Yes Harold Hedge, MD  potassium chloride SA (KLOR-CON M) 20 MEQ tablet Take 2 tablets (40 mEq total) by mouth daily. On Friday and Sunday only 11/15/21  Yes Lawonda Pretlow A, FNP  pregabalin (LYRICA) 75 MG capsule Take 75 mg by mouth 3 (three) times daily.   Yes [provider]  SPIRIVA RESPIMAT 2.5 MCG/ACT AERS Inhale 2 puffs into the lungs daily as needed. 07/22/19  Yes [provider]  spironolactone (ALDACTONE) 100 MG tablet Take 1 tablet (100 mg total) by mouth daily. MUST MAKE APPT FOR FURTHER REFILLS 02/28/21  Yes Darylene Price A, FNP  torsemide (DEMADEX) 20 MG tablet Take 2 tablets (40 mg total) by mouth daily. 09/21/21  Yes Shaydon Lease A, FNP  TRELEGY ELLIPTA  100-62.5-25 MCG/ACT AEPB Inhale 1 puff into the lungs daily. 04/13/21  Yes [provider]  triamcinolone ointment (KENALOG) 0.5 % Apply 1 application topically 2 (two) times daily. 08/23/19  Yes [provider]  umeclidinium bromide (INCRUSE ELLIPTA) 62.5 MCG/INH AEPB Inhale 1 puff into the lungs daily.   Yes [provider]  escitalopram (LEXAPRO) 10 MG tablet Take 20 mg by mouth daily. Patient not taking: Reported on 11/15/2021 01/06/20   [provider]   Review of Systems  Constitutional:  Positive for fatigue (easily). Negative for appetite change.  HENT:  Positive for congestion and trouble swallowing (since endoscopy). Negative  for postnasal drip and sore throat.   Eyes: Negative.   Respiratory:  Positive for cough (productive) and wheezing (on occasion). Negative for chest tightness and shortness of breath.   Cardiovascular:  Positive for leg swelling. Negative for chest pain and palpitations.  Gastrointestinal:  Positive for abdominal distention and nausea (due to hernia). Negative for abdominal pain.  Endocrine: Negative.   Genitourinary: Negative.   Musculoskeletal:  Positive for arthralgias (legs) and back pain.  Skin: Negative.   Allergic/Immunologic: Negative.   Neurological:  Negative for dizziness and light-headedness.  Hematological:  Negative for adenopathy. Does not bruise/bleed easily.  Psychiatric/Behavioral:  Positive for dysphoric mood and sleep disturbance (sleeping on 3 pillows; wearing oxygen @ 2L & CPAP). The patient is not nervous/anxious.    Vitals:   11/15/21 1237  BP: 98/69  Pulse: 88  Resp: 18  SpO2: 95%  Weight: 226 lb 6 oz (102.7 kg)  Height: 5' (1.524 m)   Wt Readings from Last 3 Encounters:  11/15/21 226 lb 6 oz (102.7 kg)  09/21/21 229 lb 4 oz (104 kg)  08/14/21 228 lb 8 oz (103.6 kg)   Lab Results  Component Value Date   CREATININE 0.80 11/16/2020   CREATININE 0.78 04/10/2020   CREATININE 0.83 01/26/2020    Physical Exam Vitals and nursing note reviewed.  Constitutional:      Appearance: Normal appearance.  HENT:     Head: Normocephalic and atraumatic.  Cardiovascular:     Rate and Rhythm: Normal rate and regular rhythm.  Pulmonary:     Effort: Pulmonary effort is normal. No respiratory distress.     Breath sounds: No wheezing or rales.  Abdominal:     General: There is distension.     Palpations: Abdomen is soft.     Tenderness: There is no abdominal tenderness.  Musculoskeletal:        General: No tenderness.     Cervical back: Normal range of motion and neck supple.     Right lower leg: No tenderness. Edema (2+ pitting) present.     Left lower leg: No tenderness. Edema (2+ pitting) present.  Skin:    General: Skin is warm and dry.  Neurological:     General: No focal deficit present.     Mental Status: She is alert and oriented to person, place, and time.  Psychiatric:        Mood and Affect: Mood normal.        Behavior: Behavior normal.        Thought Content: Thought content normal.    Assessment & Plan:  1: Acute on Chronic heart failure with preseved ejection fraction without structural changes- - NYHA class III - continued fluid overloaded today with pitting edema to knees and abdominal distention - weighing daily and reports 9 pound weight gain; ; reminded to call for an overnight weight gain of > 2 pounds or a weekly weight gain of >5 pounds - weight down 3 pounds from last visit here 5 weeks ago - she watched furoscix video and had no questions; reviewed how to use the device and demo device was shown so she could handle it - she will use furoscix tomorrow, skip a day and the use the 2nd device on Sunday - even though she takes spironolactone daily will add 55mq on the 2 days that she uses furoscix - will check BMP next visit - continue wearing compression socks daily with removal at bedtime - BNP 02/24/21 was 70  2:  HTN- - BP low (98/69) - she will check  her BP at home and if SBP is <100 on the days she uses furoscix, she's to hold her torsemide - saw PCP @ [facility] 07/27/21 - BMP 09/23/21 reviewed and showed sodium 140, potassium 4.5, creatinine 0.82 and GFR 84  3: DM- - A1c 07/27/21 was 5.5% - no longer on insulin nor checking her glucose at home  4: ILD- - saw pulmonology ([facility]) 10/12/21 - receives rituximab infusion every 4 months  - wearing CPAP at bedtime along with oxygen at 2L  5: Tobacco use- - smoking 4 cigarettes daily - she says she's been told that she can not have her hernia surgery until she stops smoking - admits to being under a lot of stress; her husband is 73 years older than her and she feels like he's developing some type of dementia - tobacco cessation discussed for 3 minutes with her   Medication bottles reviewed.   Return in 5 days, sooner if needed.

## 2021-11-15 NOTE — Patient Instructions (Addendum)
Continue weighing daily and call for an overnight weight gain of 3 pounds or more or a weekly weight gain of more than 5 pounds.   If you have voicemail, please make sure your mailbox is cleaned out so that we may leave a message and please make sure to listen to any voicemails.    Your provider has order Furoscix for you. This is an on-body infuser that gives you a dose of Furosemide.   For questions regarding the device call Furoscix Direct at [phone]  Ensure you write down the time you start your infusion so that if there is a problem you will know how long the infusion lasted  Use Furoscix only AS DIRECTED by our office  Dosing Directions:   Day 1= furoscix and 2 potassium tablets  Day 2= furoscix and 2 potassium tablets   Use the furoscix and potassium tablets tomorrow (Friday) and then again on Sunday. Hold your torsemide those days if the top number of your blood pressure is < 100

## 2021-11-20 ENCOUNTER — Ambulatory Visit: Payer: Medicaid Other | Admitting: Family

## 2021-11-27 ENCOUNTER — Ambulatory Visit: Payer: Medicaid Other | Admitting: Podiatry

## 2021-12-04 ENCOUNTER — Telehealth: Payer: Self-pay

## 2021-12-04 NOTE — Telephone Encounter (Signed)
Patient called back to report that her PCP is going to see her tomorrow morning for symptom management. Patient states she doesn't feel that she needs to go to ED at this time. Instructed patient that if she begins to experience difficulty breathing, chest pain, or faintness to call 911 and get to emergency room as soon as possible. Patient verbalized understanding of symptoms that would indicate immediate need for Emergency room, and she states she will keep [place] updated on her condition.   Suanne Marker, RN

## 2021-12-04 NOTE — Telephone Encounter (Signed)
Patient called to request appointment for tomorrow for symptom management. She reports "swelling has gotten out of control". She states her legs are very swollen, painful and heavy and she can barely lift them. She also reports abdominal swelling and tenderness, and worsening shortness of breath. She reports home weight today as 260 lbs. Patient's last documented weight at [facility] appointment was 226 lb. On 11/15/21.  Advised patient that HF clinic is closed this week, and provider [place]. We will reopen on 12/10/21. Patient states she is going to call her PCP but would like [name]'s advice. Informed patient I would attempt to reach provider.   Update, 2:10 PM:  Unable to reach patient when I returned her call. I left message for patient that [facility] Kindred, FNP, recommended patient go to the Emergency Room considering severity of symptoms reported and the large amount of weight gained. Left call back number for patient. Suanne Marker, RN

## 2021-12-10 ENCOUNTER — Telehealth: Payer: Self-pay | Admitting: Family

## 2021-12-10 ENCOUNTER — Other Ambulatory Visit: Payer: Self-pay | Admitting: Family

## 2021-12-10 DIAGNOSIS — I5033 Acute on chronic diastolic (congestive) heart failure: Secondary | ICD-10-CM

## 2021-12-10 NOTE — Telephone Encounter (Signed)
Patient called saying that she went to her primary care provider's office to be seen last week but then "sat there for hours and hours". At 4pm, she says that she still hadn't been seen and she had to leave. She had been advised to call them as this provider was out of town.   She says that her legs continue to be swollen and that she has gained weight although says that she hasn't weighed today so really doesn't know how much weight she has gained. Also feels like her abdomen is swollen but says that her shortness of breath is stable.   When reviewing diuretics she says that she's only taking 100mg  spironolactone and doesn't think she's been taking torsemide.   Will schedule for IV lasix and she asks that it be done tomorrow morning instead of today. Have scheduled f/u in our office the day after she gets IV lasix. Emphasized that she bring her medications to her appointment so we can make sure that our med list is correct and up to date.   Patient verbalized understanding.

## 2021-12-11 ENCOUNTER — Ambulatory Visit
Admission: RE | Admit: 2021-12-11 | Discharge: 2021-12-11 | Disposition: A | Discharge status: Home / Self Care | Payer: Medicaid Other | Source: Ambulatory Visit | Attending: Family | Admitting: Family

## 2021-12-11 DIAGNOSIS — I5033 Acute on chronic diastolic (congestive) heart failure: Secondary | ICD-10-CM

## 2021-12-11 LAB — BASIC METABOLIC PANEL
Anion gap: 6 (ref 5–15)
BUN: 31 mg/dL — ABNORMAL HIGH (ref 6–20)
CO2: 27 mmol/L (ref 22–32)
Calcium: 8.2 mg/dL — ABNORMAL LOW (ref 8.9–10.3)
Chloride: 104 mmol/L (ref 98–111)
Creatinine, Ser: 1.32 mg/dL — ABNORMAL HIGH (ref 0.44–1.00)
GFR, Estimated: 47 mL/min — ABNORMAL LOW (ref >60)
Glucose, Bld: 151 mg/dL — ABNORMAL HIGH (ref 70–99)
Potassium: 4.4 mmol/L (ref 3.5–5.1)
Sodium: 137 mmol/L (ref 135–145)

## 2021-12-11 LAB — BRAIN NATRIURETIC PEPTIDE: B Natriuretic Peptide: 92 pg/mL (ref 0.0–100.0)

## 2021-12-11 MED ORDER — POTASSIUM CHLORIDE CRYS ER 20 MEQ PO TBCR: 40.0000 meq | EXTENDED_RELEASE_TABLET | Freq: Once | ORAL | Status: AC

## 2021-12-11 MED ORDER — FUROSEMIDE 10 MG/ML IJ SOLN: 80.0000 mg | Freq: Once | INTRAMUSCULAR | Status: AC

## 2021-12-11 MED ORDER — POTASSIUM CHLORIDE CRYS ER 20 MEQ PO TBCR
EXTENDED_RELEASE_TABLET | ORAL | Status: AC
  Administered 2021-12-11: 40 meq via ORAL
  Filled 2021-12-11: qty 2

## 2021-12-11 MED ORDER — FUROSEMIDE 10 MG/ML IJ SOLN
INTRAMUSCULAR | Status: AC
  Administered 2021-12-11: 80 mg via INTRAVENOUS
  Filled 2021-12-11: qty 8

## 2021-12-11 NOTE — Progress Notes (Unsigned)
Patient ID: [name], female    DOB: 1965-08-20, 56 y.o.   MRN: [id number]   Ms Wescott is a 56 y/o female with a history of asthma, DM, hyperlipidemia, HTN, sleep apnea, ILD, hepatic cirrhosis, COPD, PTSD, current tobacco use and chronic heart failure.   Echo report from 09/13/20 reviewed and showed an EF of 55-60% with mild LAE. Echo report from 01/22/20 reviewed and showed an EF of 60-65%.  Was in the ED 09/23/21 due to abdominal pain due to gastritis & esophagitis. Colonoscopy/endoscopy done 4 days prior. Pantoprazole increased to BID and she was released.                                           She presents today for a follow-up visit with a chief complaint of moderate fatigue with little exertion. Describes this as chronic in nature although she feels like it's worsening where she falls asleep "all the time". She has associated decreased appetite (feels full easily), blurry vision, dry cough, shortness of breath, wheezing, pedal edema (with tenderness and erythema on right), abdominal distention, light-headedness, depression, difficulty sleeping and 20 pound weight gain over the last month. Denies any palpitations or chest pain. She says that she feels "stumbley" when she's walking because her legs feel so heavy.    Had torsemide increased by [facility] last week for 5 days and received 19m IV lasix/ 428m PO potassium yesterday without much improvement. She's concerned about a blood clot in her right lower leg due to the redness and tenderness in the calf.   Denies missing any of her medications. Not adding any salt to her food and says that she hasn't been eating out much lately.   Past Medical History:  Diagnosis Date   Asthma    CHF (congestive heart failure) (HCC)    COPD (chronic obstructive pulmonary disease) (HCPear[place]   Diabetes mellitus without complication (HC[facility]ur[facility]   Diastolic dysfunction    [facility] Gr1 DD. Nl RV fxn.   Esophageal varices (HCAmeric[place]   a.  10/2019 EGD: gr III esoph varices s/p banding.   Essential hypertension    GI bleed    HCV (hepatitis C virus)    Hematemesis    a. 10/2019 EGD: Gr III esoph varices, incompletely eradicated, banded. Non-bleeding ulcers tx w/ argon plasma coag. Single non-bleeding angiodysplastic lesion in duodenum tx w/ APC.   Hepatic cirrhosis (HCC)    a. h/o ETOH/Hep C.   Hypertension    Interstitial lung disease (HCHato [place]   a. Followed @ [facility] pulm. Prev on cellcept (caused GI side effects), Imuran (GI se), myfortic (elev LFTs). More recently on rituximab.   Mixed hyperlipidemia    Morbid obesity (HCC)    O2 dependent    PTSD (post-traumatic stress disorder)    Sleep apnea    Tobacco abuse    Past Surgical History:  Procedure Laterality Date   CHOLECYSTECTOMY     ESOPHAGOGASTRODUODENOSCOPY (EGD) WITH PROPOFOL N/A 11/04/2019   Procedure: ESOPHAGOGASTRODUODENOSCOPY (EGD) WITH PROPOFOL;  Surgeon: WoLucilla LameMD;  Location: [facility] ENDOSCOPY;  Service: Endoscopy;  Laterality: N/A;   ORIF TIBIA PLATEAU Left 07/02/2015   Procedure: OPEN REDUCTION INTERNAL FIXATION (ORIF) TIBIAL PLATEAU;  Surgeon: KeThornton ParkMD;  Location: [facility] ORS;  Service: Orthopedics;  Laterality: Left;   Family History  Problem Relation Age of Onset  Heart failure Mother    Social History   Tobacco Use   Smoking status: Every Day    Packs/day: 0.25    Years: 36.00    Total pack years: 9.00    Types: Cigarettes   Smokeless tobacco: Never  Substance Use Topics   Alcohol use: Yes    Alcohol/week: 0.0 standard drinks of alcohol    Comment: 2-3 drinks/wk. Prev heavy drinker.   Allergies  Allergen Reactions   Other Other (See Comments)    Gel capsules causes GI problems   Citrated Iron     GI upset   Gelatin    Mycophenolate Sodium Other (See Comments)    transaminitis   Azathioprine Nausea Only   Mycophenolate Mofetil Nausea Only   Tacrolimus Diarrhea and Nausea Only   Prior to Admission medications   Medication Sig  Start Date End Date Taking? Authorizing Provider  albuterol (PROVENTIL HFA;VENTOLIN HFA) 108 (90 Base) MCG/ACT inhaler Inhale 1-2 puffs into the lungs every 6 (six) hours as needed for wheezing or shortness of breath. 03/10/13  Yes [provider]  albuterol (PROVENTIL) (2.5 MG/3ML) 0.083% nebulizer solution Take 2.5 mg by nebulization every 4 (four) hours as needed. 01/03/20  Yes [provider]  atorvastatin (LIPITOR) 10 MG tablet Take 10 mg by mouth daily.  04/24/18  Yes [provider]  budesonide-formoterol (SYMBICORT) 160-4.5 MCG/ACT inhaler Inhale 2 puffs into the lungs daily. 07/02/19  Yes [provider]  carvedilol (COREG) 3.125 MG tablet Take 3.125 mg by mouth daily.   Yes [provider]  fluticasone (FLONASE) 50 MCG/ACT nasal spray Place 1 spray into both nostrils daily.   Yes [provider]  losartan (COZAAR) 100 MG tablet Take 0.5 tablets (50 mg total) by mouth daily. 11/06/21  Yes Hubert Derstine A, FNP  montelukast (SINGULAIR) 10 MG tablet Take 10 mg by mouth at bedtime.  11/19/19  Yes [provider]  naloxone Karma Greaser) 4 MG/0.1ML LIQD nasal spray kit  08/18/18  Yes [provider]  Oxycodone HCl 10 MG TABS Take 10 mg by mouth 4 (four) times daily.   Yes [provider]  pantoprazole (PROTONIX) 40 MG tablet Take 1 tablet (40 mg total) by mouth 2 (two) times daily. Patient taking differently: Take 40 mg by mouth daily. 11/08/19  Yes Harold Hedge, MD  potassium chloride SA (KLOR-CON M) 20 MEQ tablet Take 2 tablets (40 mEq total) by mouth daily. On Friday and Sunday only 11/15/21  Yes Ila Landowski A, FNP  pregabalin (LYRICA) 75 MG capsule Take 75 mg by mouth 3 (three) times daily.   Yes [provider]  SPIRIVA RESPIMAT 2.5 MCG/ACT AERS Inhale 2 puffs into the lungs daily as needed. 07/22/19  Yes [provider]  spironolactone (ALDACTONE) 100 MG tablet Take 1 tablet (100 mg total) by mouth daily.  MUST MAKE APPT FOR FURTHER REFILLS 02/28/21  Yes Darylene Price A, FNP  torsemide (DEMADEX) 20 MG tablet Take 2 tablets (40 mg total) by mouth daily. 09/21/21  Yes Canaan Holzer A, FNP  TRELEGY ELLIPTA 100-62.5-25 MCG/ACT AEPB Inhale 1 puff into the lungs daily. 04/13/21  Yes [provider]  triamcinolone ointment (KENALOG) 0.5 % Apply 1 application topically 2 (two) times daily. 08/23/19  Yes [provider]  venlafaxine (EFFEXOR) 37.5 MG tablet Take 37.5 mg by mouth daily.   Yes [provider]  fluticasone furoate-vilanterol (BREO ELLIPTA) 200-25 MCG/INH AEPB Inhale 1 puff into the lungs daily. Patient not taking: Reported  on 12/12/2021    [provider]  umeclidinium bromide (INCRUSE ELLIPTA) 62.5 MCG/INH AEPB Inhale 1 puff into the lungs daily. Patient not taking: Reported on 12/12/2021    [provider]    Review of Systems  Constitutional:  Positive for appetite change (feeling full easily) and fatigue (easily).  HENT:  Positive for congestion and trouble swallowing (since endoscopy). Negative for postnasal drip and sore throat.   Eyes:  Positive for visual disturbance (blurry vision).  Respiratory:  Positive for cough (dry), shortness of breath and wheezing (on occasion). Negative for chest tightness.   Cardiovascular:  Positive for leg swelling (worsening). Negative for chest pain and palpitations.  Gastrointestinal:  Positive for abdominal distention and nausea (due to hernia). Negative for abdominal pain.  Endocrine: Negative.   Genitourinary: Negative.   Musculoskeletal:  Positive for arthralgias (legs) and back pain.  Skin: Negative.   Allergic/Immunologic: Negative.   Neurological:  Positive for light-headedness. Negative for dizziness.       Feels off-balance and stumbles  Hematological:  Negative for adenopathy. Does not bruise/bleed easily.  Psychiatric/Behavioral:  Positive for dysphoric mood and sleep disturbance (sleeping on 3  pillows; wearing oxygen @ 2L & CPAP). The patient is not nervous/anxious.    Vitals:   12/12/21 1101  BP: 90/66  Pulse: 94  Resp: 20  SpO2: 97%  Weight: 231 lb 6 oz (105 kg)  Height: '5\' 3"'  (1.6 m)   Wt Readings from Last 3 Encounters:  12/12/21 231 lb 6 oz (105 kg)  11/15/21 226 lb 6 oz (102.7 kg)  09/21/21 229 lb 4 oz (104 kg)   Lab Results  Component Value Date   CREATININE 1.32 (H) 12/11/2021   CREATININE 0.80 11/16/2020   CREATININE 0.78 04/10/2020   Physical Exam Vitals and nursing note reviewed.  Constitutional:      Appearance: Normal appearance.  HENT:     Head: Normocephalic and atraumatic.  Eyes:     Comments: Puffiness noted below bilateral eyelids  Cardiovascular:     Rate and Rhythm: Normal rate and regular rhythm.  Pulmonary:     Effort: Pulmonary effort is normal. No respiratory distress.     Breath sounds: No wheezing or rales.  Abdominal:     General: There is distension.     Palpations: Abdomen is soft.     Tenderness: There is no abdominal tenderness.  Musculoskeletal:        General: Tenderness (both legs but R>L) present.     Cervical back: Normal range of motion and neck supple.     Right lower leg: No tenderness. Edema (2+ pitting) present.     Left lower leg: No tenderness. Edema (2+ pitting) present.  Skin:    General: Skin is warm and dry.     Findings: Erythema (right calf (patient denies bug bite)) present.  Neurological:     General: No focal deficit present.     Mental Status: She is alert and oriented to person, place, and time.  Psychiatric:        Mood and Affect: Mood normal.        Behavior: Behavior normal.        Thought Content: Thought content normal.   Assessment & Plan:  1: Acute on Chronic heart failure with preseved ejection fraction without structural changes- - NYHA class III - continued fluid overloaded today with pitting edema to knees,abdominal distention, worsening symptoms and now hypotension - weighing  daily and reports 20-30 pound weight gain  over the last month - weight up 5 pounds from last visit here 2 months ago - received 84m IV lasix/ 49m PO potassium yesterday without much improvement in symptoms - says that she's not adding salt and not eating out much lately - BNP 12/11/21 was 92 - Based on her worsening symptoms, minimal response to IV lasix and now a soft BP, explained that it would be safest for her to go to the ED for further evaluation. May need some fluids and lasix drip. With her low blood pressure and all the symptoms she is having and concern for DVT, emphasized that she go to the ED from here. She declines as she says that she has to make arrangements for someone to take care of her disabled brother (which she normally does). She does contract that she will go to the ED later today  2: HTN- - BP low (90/66) and this was after recheck; initial BP was 88/60 - [facility] & had torsemide increased to 8037mID for 5 days - BMP 12/11/21 reviewed and showed sodium 137, potassium 4.4, creatinine 1.32 and GFR 47  3: DM- - A1c 07/27/21 was 5.5% - no longer on insulin nor checking her glucose at home  4: ILD- - saw pulmonology ([facility]) 10/12/21 - receives rituximab infusion every 4 months  - wearing CPAP at bedtime along with oxygen at 2L  5: Tobacco use- - smoking 4 cigarettes daily - she says she's been told that she can not have her hernia surgery until she stops smoking - admits to being under a lot of stress; her husband is 16 23ars older than her and she feels like he's developing some type of dementia - tobacco cessation discussed for 3 minutes with her   Medication bottles reviewed.   Return in 5 days to f/u ED visit.

## 2021-12-12 ENCOUNTER — Encounter: Payer: Self-pay | Admitting: Family

## 2021-12-12 ENCOUNTER — Ambulatory Visit: Payer: Medicaid Other | Attending: Family | Admitting: Family

## 2021-12-12 VITALS — BP 90/66 | HR 94 | Resp 20 | Ht 63.0 in | Wt 231.4 lb

## 2021-12-12 DIAGNOSIS — J449 Chronic obstructive pulmonary disease, unspecified: Secondary | ICD-10-CM | POA: Diagnosis not present

## 2021-12-12 DIAGNOSIS — R609 Edema, unspecified: Secondary | ICD-10-CM | POA: Insufficient documentation

## 2021-12-12 DIAGNOSIS — I10 Essential (primary) hypertension: Secondary | ICD-10-CM

## 2021-12-12 DIAGNOSIS — E119 Type 2 diabetes mellitus without complications: Secondary | ICD-10-CM | POA: Diagnosis not present

## 2021-12-12 DIAGNOSIS — Z79899 Other long term (current) drug therapy: Secondary | ICD-10-CM | POA: Insufficient documentation

## 2021-12-12 DIAGNOSIS — J849 Interstitial pulmonary disease, unspecified: Secondary | ICD-10-CM

## 2021-12-12 DIAGNOSIS — R140 Abdominal distension (gaseous): Secondary | ICD-10-CM | POA: Insufficient documentation

## 2021-12-12 DIAGNOSIS — F17210 Nicotine dependence, cigarettes, uncomplicated: Secondary | ICD-10-CM | POA: Insufficient documentation

## 2021-12-12 DIAGNOSIS — K7460 Unspecified cirrhosis of liver: Secondary | ICD-10-CM | POA: Insufficient documentation

## 2021-12-12 DIAGNOSIS — I959 Hypotension, unspecified: Secondary | ICD-10-CM | POA: Insufficient documentation

## 2021-12-12 DIAGNOSIS — E785 Hyperlipidemia, unspecified: Secondary | ICD-10-CM | POA: Diagnosis not present

## 2021-12-12 DIAGNOSIS — I110 Hypertensive heart disease with heart failure: Secondary | ICD-10-CM | POA: Insufficient documentation

## 2021-12-12 DIAGNOSIS — F4310 Post-traumatic stress disorder, unspecified: Secondary | ICD-10-CM | POA: Insufficient documentation

## 2021-12-12 DIAGNOSIS — I5033 Acute on chronic diastolic (congestive) heart failure: Secondary | ICD-10-CM

## 2021-12-12 DIAGNOSIS — G4730 Sleep apnea, unspecified: Secondary | ICD-10-CM | POA: Diagnosis not present

## 2021-12-12 DIAGNOSIS — F17200 Nicotine dependence, unspecified, uncomplicated: Secondary | ICD-10-CM

## 2021-12-12 NOTE — Patient Instructions (Signed)
Continue weighing daily and call for an overnight weight gain of 3 pounds or more or a weekly weight gain of more than 5 pounds.   If you have voicemail, please make sure your mailbox is cleaned out so that we may leave a message and please make sure to listen to any voicemails.     

## 2021-12-13 ENCOUNTER — Ambulatory Visit: Payer: Medicaid Other | Admitting: Podiatry

## 2021-12-17 ENCOUNTER — Ambulatory Visit: Payer: Medicaid Other | Admitting: Family

## 2021-12-17 NOTE — Progress Notes (Deleted)
m  Patient ID: [name], female    DOB: 1965/11/22, 56 y.o.   MRN: [id number]   [name] is a 56 y/o female with a history of asthma, DM, hyperlipidemia, HTN, sleep apnea, ILD, hepatic cirrhosis, COPD, PTSD, current tobacco use and chronic heart failure.   Echo report from 09/13/20 reviewed and showed an EF of 55-60% with mild LAE. Echo report from 01/22/20 reviewed and showed an EF of 60-65%.  Was in the ED 09/23/21 due to abdominal pain due to gastritis & esophagitis. Colonoscopy/endoscopy done 4 days prior. Pantoprazole increased to BID and she was released.                                           She presents today for a follow-up visit with a chief complaint of moderate fatigue with little exertion. Describes this as chronic in nature although she feels like it's worsening where she falls asleep "all the time". She has associated decreased appetite (feels full easily), blurry vision, dry cough, shortness of breath, wheezing, pedal edema (with tenderness and erythema on right), abdominal distention, light-headedness, depression, difficulty sleeping and 20 pound weight gain over the last month. Denies any palpitations or chest pain. She says that she feels "stumbley" when she's walking because her legs feel so heavy.    Had torsemide increased by [facility] last week for 5 days and received 84m IV lasix/ 469m PO potassium yesterday without much improvement. She's concerned about a blood clot in her right lower leg due to the redness and tenderness in the calf.   Denies missing any of her medications. Not adding any salt to her food and says that she hasn't been eating out much lately.   Past Medical History:  Diagnosis Date   Asthma    CHF (congestive heart failure) (HCC)    COPD (chronic obstructive pulmonary disease) ([place]CP[place]   Diabetes mellitus without complication (HCAnnet[place]   Diastolic dysfunction    [facility] Gr1 DD. Nl RV fxn.   Esophageal varices (HCGorst   a.  10/2019 EGD: gr III esoph varices s/p banding.   Essential hypertension    GI bleed    HCV (hepatitis C virus)    Hematemesis    a. 10/2019 EGD: Gr III esoph varices, incompletely eradicated, banded. Non-bleeding ulcers tx w/ argon plasma coag. Single non-bleeding angiodysplastic lesion in duodenum tx w/ APC.   Hepatic cirrhosis (HCC)    a. h/o ETOH/Hep C.   Hypertension    Interstitial lung disease (HCEagar   a. Followed @ [facility] pulm. Prev on cellcept (caused GI side effects), Imuran (GI se), myfortic (elev LFTs). More recently on rituximab.   Mixed hyperlipidemia    Morbid obesity (HCC)    O2 dependent    PTSD (post-traumatic stress disorder)    Sleep apnea    Tobacco abuse    Past Surgical History:  Procedure Laterality Date   CHOLECYSTECTOMY     ESOPHAGOGASTRODUODENOSCOPY (EGD) WITH PROPOFOL N/A 11/04/2019   Procedure: ESOPHAGOGASTRODUODENOSCOPY (EGD) WITH PROPOFOL;  Surgeon: WoLucilla LameMD;  Location: [facility] ENDOSCOPY;  Service: Endoscopy;  Laterality: N/A;   ORIF TIBIA PLATEAU Left 07/02/2015   Procedure: OPEN REDUCTION INTERNAL FIXATION (ORIF) TIBIAL PLATEAU;  Surgeon: KeThornton ParkMD;  Location: [facility] ORS;  Service: Orthopedics;  Laterality: Left;   Family History  Problem Relation Age of  Onset   Heart failure Mother    Social History   Tobacco Use   Smoking status: Every Day    Packs/day: 0.25    Years: 36.00    Total pack years: 9.00    Types: Cigarettes   Smokeless tobacco: Never  Substance Use Topics   Alcohol use: Yes    Alcohol/week: 0.0 standard drinks of alcohol    Comment: 2-3 drinks/wk. Prev heavy drinker.   Allergies  Allergen Reactions   Other Other (See Comments)    Gel capsules causes GI problems   Citrated Iron     GI upset   Gelatin    Mycophenolate Sodium Other (See Comments)    transaminitis   Azathioprine Nausea Only   Mycophenolate Mofetil Nausea Only   Tacrolimus Diarrhea and Nausea Only   Prior to Admission medications   Medication Sig  Start Date End Date Taking? Authorizing Provider  albuterol (PROVENTIL HFA;VENTOLIN HFA) 108 (90 Base) MCG/ACT inhaler Inhale 1-2 puffs into the lungs every 6 (six) hours as needed for wheezing or shortness of breath. 03/10/13  Yes [provider]  albuterol (PROVENTIL) (2.5 MG/3ML) 0.083% nebulizer solution Take 2.5 mg by nebulization every 4 (four) hours as needed. 01/03/20  Yes [provider]  atorvastatin (LIPITOR) 10 MG tablet Take 10 mg by mouth daily.  04/24/18  Yes [provider]  budesonide-formoterol (SYMBICORT) 160-4.5 MCG/ACT inhaler Inhale 2 puffs into the lungs daily. 07/02/19  Yes [provider]  carvedilol (COREG) 3.125 MG tablet Take 3.125 mg by mouth daily.   Yes [provider]  fluticasone (FLONASE) 50 MCG/ACT nasal spray Place 1 spray into both nostrils daily.   Yes [provider]  losartan (COZAAR) 100 MG tablet Take 0.5 tablets (50 mg total) by mouth daily. 11/06/21  Yes Hackney, Tina A, FNP  montelukast (SINGULAIR) 10 MG tablet Take 10 mg by mouth at bedtime.  11/19/19  Yes [provider]  naloxone Karma Greaser) 4 MG/0.1ML LIQD nasal spray kit  08/18/18  Yes [provider]  Oxycodone HCl 10 MG TABS Take 10 mg by mouth 4 (four) times daily.   Yes [provider]  pantoprazole (PROTONIX) 40 MG tablet Take 1 tablet (40 mg total) by mouth 2 (two) times daily. Patient taking differently: Take 40 mg by mouth daily. 11/08/19  Yes Harold Hedge, MD  potassium chloride SA (KLOR-CON M) 20 MEQ tablet Take 2 tablets (40 mEq total) by mouth daily. On Friday and Sunday only 11/15/21  Yes Hackney, Tina A, FNP  pregabalin (LYRICA) 75 MG capsule Take 75 mg by mouth 3 (three) times daily.   Yes [provider]  SPIRIVA RESPIMAT 2.5 MCG/ACT AERS Inhale 2 puffs into the lungs daily as needed. 07/22/19  Yes [provider]  spironolactone (ALDACTONE) 100 MG tablet Take 1 tablet (100 mg total) by mouth daily.  MUST MAKE APPT FOR FURTHER REFILLS 02/28/21  Yes Darylene Price A, FNP  torsemide (DEMADEX) 20 MG tablet Take 2 tablets (40 mg total) by mouth daily. 09/21/21  Yes Hackney, Tina A, FNP  TRELEGY ELLIPTA 100-62.5-25 MCG/ACT AEPB Inhale 1 puff into the lungs daily. 04/13/21  Yes [provider]  triamcinolone ointment (KENALOG) 0.5 % Apply 1 application topically 2 (two) times daily. 08/23/19  Yes [provider]  venlafaxine (EFFEXOR) 37.5 MG tablet Take 37.5 mg by mouth daily.   Yes [provider]  fluticasone furoate-vilanterol (BREO ELLIPTA) 200-25 MCG/INH AEPB Inhale 1 puff into the lungs daily. Patient  not taking: Reported on 12/12/2021    [provider]  umeclidinium bromide (INCRUSE ELLIPTA) 62.5 MCG/INH AEPB Inhale 1 puff into the lungs daily. Patient not taking: Reported on 12/12/2021    [provider]    Review of Systems  Constitutional:  Positive for appetite change (feeling full easily) and fatigue (easily).  HENT:  Positive for congestion and trouble swallowing (since endoscopy). Negative for postnasal drip and sore throat.   Eyes:  Positive for visual disturbance (blurry vision).  Respiratory:  Positive for cough (dry), shortness of breath and wheezing (on occasion). Negative for chest tightness.   Cardiovascular:  Positive for leg swelling (worsening). Negative for chest pain and palpitations.  Gastrointestinal:  Positive for abdominal distention and nausea (due to hernia). Negative for abdominal pain.  Endocrine: Negative.   Genitourinary: Negative.   Musculoskeletal:  Positive for arthralgias (legs) and back pain.  Skin: Negative.   Allergic/Immunologic: Negative.   Neurological:  Positive for light-headedness. Negative for dizziness.       Feels off-balance and stumbles  Hematological:  Negative for adenopathy. Does not bruise/bleed easily.  Psychiatric/Behavioral:  Positive for dysphoric mood and sleep disturbance (sleeping on 3  pillows; wearing oxygen @ 2L & CPAP). The patient is not nervous/anxious.    There were no vitals filed for this visit.  Wt Readings from Last 3 Encounters:  12/12/21 231 lb 6 oz (105 kg)  11/15/21 226 lb 6 oz (102.7 kg)  09/21/21 229 lb 4 oz (104 kg)   Lab Results  Component Value Date   CREATININE 1.32 (H) 12/11/2021   CREATININE 0.80 11/16/2020   CREATININE 0.78 04/10/2020   Physical Exam Vitals and nursing note reviewed.  Constitutional:      Appearance: Normal appearance.  HENT:     Head: Normocephalic and atraumatic.  Eyes:     Comments: Puffiness noted below bilateral eyelids  Cardiovascular:     Rate and Rhythm: Normal rate and regular rhythm.  Pulmonary:     Effort: Pulmonary effort is normal. No respiratory distress.     Breath sounds: No wheezing or rales.  Abdominal:     General: There is distension.     Palpations: Abdomen is soft.     Tenderness: There is no abdominal tenderness.  Musculoskeletal:        General: Tenderness (both legs but R>L) present.     Cervical back: Normal range of motion and neck supple.     Right lower leg: No tenderness. Edema (2+ pitting) present.     Left lower leg: No tenderness. Edema (2+ pitting) present.  Skin:    General: Skin is warm and dry.     Findings: Erythema (right calf (patient denies bug bite)) present.  Neurological:     General: No focal deficit present.     Mental Status: She is alert and oriented to person, place, and time.  Psychiatric:        Mood and Affect: Mood normal.        Behavior: Behavior normal.        Thought Content: Thought content normal.   Assessment & Plan:  1: Acute on Chronic heart failure with preseved ejection fraction without structural changes- - NYHA class III - continued fluid overloaded today with pitting edema to knees,abdominal distention, worsening symptoms and now hypotension - weighing daily and reports 20-30 pound weight gain over the last month - weight up 5 pounds from  last visit here 2 months ago - received 48m IV lasix/  37mq PO potassium yesterday without much improvement in symptoms - says that she's not adding salt and not eating out much lately - BNP 12/11/21 was 92 - Based on her worsening symptoms, minimal response to IV lasix and now a soft BP, explained that it would be safest for her to go to the ED for further evaluation. May need some fluids and lasix drip. With her low blood pressure and all the symptoms she is having and concern for DVT, emphasized that she go to the ED from here. She declines as she says that she has to make arrangements for someone to take care of her disabled brother (which she normally does). She does contract that she will go to the ED later today  2: HTN- - BP low (90/66) and this was after recheck; initial BP was 88/60 [facility] had torsemide increased to 884mBID for 5 days - BMP 12/11/21 reviewed and showed sodium 137, potassium 4.4, creatinine 1.32 and GFR 47  3: DM- - A1c 07/27/21 was 5.5% - no longer on insulin nor checking her glucose at home  4: ILD- - saw pulmonology ([facility]) 10/12/21 - receives rituximab infusion every 4 months  - wearing CPAP at bedtime along with oxygen at 2L  5: Tobacco use- - smoking 4 cigarettes daily - she says she's been told that she can not have her hernia surgery until she stops smoking - admits to being under a lot of stress; her husband is 1626ears older than her and she feels like he's developing some type of dementia - tobacco cessation discussed for 3 minutes with her   Medication bottles reviewed.   Return in 5 days to f/u ED visit.

## 2021-12-27 ENCOUNTER — Telehealth: Payer: Self-pay | Admitting: Family

## 2021-12-27 NOTE — Telephone Encounter (Signed)
Called and LVM for patient in attempt to get her reschedule after she missed her last [facility] appointment.  Jezabella Schriever, NT

## 2022-01-18 ENCOUNTER — Ambulatory Visit: Payer: Medicaid Other | Admitting: Family

## 2022-01-31 ENCOUNTER — Encounter: Payer: Self-pay | Admitting: Family

## 2022-01-31 ENCOUNTER — Ambulatory Visit: Payer: Medicaid Other | Attending: Family | Admitting: Family

## 2022-01-31 VITALS — BP 88/56 | HR 79 | Resp 18 | Ht 63.0 in | Wt 224.1 lb

## 2022-01-31 DIAGNOSIS — F17210 Nicotine dependence, cigarettes, uncomplicated: Secondary | ICD-10-CM | POA: Insufficient documentation

## 2022-01-31 DIAGNOSIS — E785 Hyperlipidemia, unspecified: Secondary | ICD-10-CM | POA: Diagnosis not present

## 2022-01-31 DIAGNOSIS — Z79899 Other long term (current) drug therapy: Secondary | ICD-10-CM | POA: Insufficient documentation

## 2022-01-31 DIAGNOSIS — J449 Chronic obstructive pulmonary disease, unspecified: Secondary | ICD-10-CM | POA: Insufficient documentation

## 2022-01-31 DIAGNOSIS — F17200 Nicotine dependence, unspecified, uncomplicated: Secondary | ICD-10-CM

## 2022-01-31 DIAGNOSIS — E119 Type 2 diabetes mellitus without complications: Secondary | ICD-10-CM | POA: Diagnosis not present

## 2022-01-31 DIAGNOSIS — I10 Essential (primary) hypertension: Secondary | ICD-10-CM

## 2022-01-31 DIAGNOSIS — I5032 Chronic diastolic (congestive) heart failure: Secondary | ICD-10-CM | POA: Diagnosis not present

## 2022-01-31 DIAGNOSIS — F4310 Post-traumatic stress disorder, unspecified: Secondary | ICD-10-CM | POA: Insufficient documentation

## 2022-01-31 DIAGNOSIS — I110 Hypertensive heart disease with heart failure: Secondary | ICD-10-CM | POA: Insufficient documentation

## 2022-01-31 DIAGNOSIS — G4730 Sleep apnea, unspecified: Secondary | ICD-10-CM | POA: Diagnosis not present

## 2022-01-31 DIAGNOSIS — J849 Interstitial pulmonary disease, unspecified: Secondary | ICD-10-CM

## 2022-01-31 DIAGNOSIS — K7460 Unspecified cirrhosis of liver: Secondary | ICD-10-CM | POA: Diagnosis not present

## 2022-01-31 DIAGNOSIS — Z8249 Family history of ischemic heart disease and other diseases of the circulatory system: Secondary | ICD-10-CM | POA: Diagnosis not present

## 2022-01-31 NOTE — Patient Instructions (Addendum)
Continue weighing daily and call for an overnight weight gain of 3 pounds or more or a weekly weight gain of more than 5 pounds.  If you have voicemail, please make sure your mailbox is cleaned out so that we may leave a message and please make sure to listen to any voicemails.    Do not take anymore losartan.   Do not take the torsemide with the furosemide  Decrease spironolactone to 1/2 tablet every day   Call us in the future if you need [place] for anything.

## 2022-01-31 NOTE — Progress Notes (Signed)
Patient ID: [name], female    DOB: 12/21/1965, 56 y.o.   MRN: [id number]   [name] is [name] 56 y/o female with [name] history of asthma, DM, hyperlipidemia, HTN, sleep apnea, ILD, hepatic cirrhosis, COPD, PTSD, current tobacco use and chronic heart failure.   Echo report from 12/17/21 reviewed and showed an EF of >55%. Echo report from 09/13/20 reviewed and showed an EF of 55-60% with mild LAE. Echo report from 01/22/20 reviewed and showed an EF of 60-65%.  Admitted 12/14/21 due to SOB/ weight gain. Ultrasound negative for DVT. Diuretic changed to furosemide. Hyperkalemia corrected. Discharged after 8 days. Was in the ED 09/23/21 due to abdominal pain due to gastritis & esophagitis. Colonoscopy/endoscopy done 4 days prior. Pantoprazole increased to BID and she was released.                                           She presents today for [name] follow-up visit with [name] chief complaint of moderate fatigue with minimal exertion. She describes this as chronic in nature. She has associated cough, shortness of breath, wheezing, chronic difficulty sleeping, depression, chronic pain or light-headedness along with this. She denies any abdominal distention, palpitations, pedal edema, chest pain or weight gain.   Just recently picked up the furosemide and will now start taking it. Has continued her torsemide and has been taking 129m losartan daily.   Says that she has [name] lot of appointments coming up.   Past Medical History:  Diagnosis Date   Asthma    CHF (congestive heart failure) (HCC)    COPD (chronic obstructive pulmonary disease) ([facility]    Diabetes mellitus without complication ([facility]    Diastolic dysfunction    [name]. 03/2018 Echo ([facility]: EF >55%, Gr1 DD. Nl RV fxn.   Esophageal varices ([facility]    [name]. 10/2019 EGD: gr III esoph varices s/p banding.   Essential hypertension    GI bleed    HCV (hepatitis C virus)    Hematemesis    [name]. 10/2019 EGD: Gr III esoph varices, incompletely eradicated, banded.  Non-bleeding ulcers tx w/ argon plasma coag. Single non-bleeding angiodysplastic lesion in duodenum tx w/ APC.   Hepatic cirrhosis (HCC)    [name]. h/o ETOH/Hep C.   Hypertension    Interstitial lung disease ([facility]    [name]. Followed @ [facility] pulm. Prev on cellcept (caused GI side effects), Imuran (GI se), myfortic (elev LFTs). More recently on rituximab.   Mixed hyperlipidemia    Morbid obesity (HCC)    O2 dependent    PTSD (post-traumatic stress disorder)    Sleep apnea    Tobacco abuse    Past Surgical History:  Procedure Laterality Date   CHOLECYSTECTOMY     ESOPHAGOGASTRODUODENOSCOPY (EGD) WITH PROPOFOL N/[name] 11/04/2019   Procedure: ESOPHAGOGASTRODUODENOSCOPY (EGD) WITH PROPOFOL;  Surgeon: WLucilla Lame MD;  Location: [facility] ENDOSCOPY;  Service: Endoscopy;  Laterality: N/[name];   ORIF TIBIA PLATEAU Left 07/02/2015   Procedure: OPEN REDUCTION INTERNAL FIXATION (ORIF) TIBIAL PLATEAU;  Surgeon: KThornton Park MD;  Location: [facility] ORS;  Service: Orthopedics;  Laterality: Left;   Family History  Problem Relation Age of Onset   Heart failure Mother    Social History   Tobacco Use   Smoking status: Every Day    Packs/day: 0.25    Years: 36.00    Total pack years: 9.00    Types:  Cigarettes   Smokeless tobacco: Never  Substance Use Topics   Alcohol use: Yes    Alcohol/week: 0.0 standard drinks of alcohol    Comment: 2-3 drinks/wk. Prev heavy drinker.   Allergies  Allergen Reactions   Other Other (See Comments)    Gel capsules causes GI problems   Citrated Iron     GI upset   Gelatin    Mycophenolate Sodium Other (See Comments)    transaminitis   Azathioprine Nausea Only   Mycophenolate Mofetil Nausea Only   Tacrolimus Diarrhea and Nausea Only   Prior to Admission medications   Medication Sig Start Date End Date Taking? Authorizing Provider  albuterol (PROVENTIL HFA;VENTOLIN HFA) 108 (90 Base) MCG/ACT inhaler Inhale 1-2 puffs into the lungs every 6 (six) hours as needed for wheezing or  shortness of breath. 03/10/13  Yes [provider]  albuterol (PROVENTIL) (2.5 MG/3ML) 0.083% nebulizer solution Take 2.5 mg by nebulization every 4 (four) hours as needed. 01/03/20  Yes [provider]  atorvastatin (LIPITOR) 10 MG tablet Take 10 mg by mouth daily.  04/24/18  Yes [provider]  carvedilol (COREG) 3.125 MG tablet Take 3.125 mg by mouth daily.   Yes [provider]  clonazePAM (KLONOPIN) 0.5 MG tablet Take 0.5 mg by mouth 2 (two) times daily. 01/17/22  Yes [provider]  escitalopram (LEXAPRO) 20 MG tablet Take 20 mg by mouth every morning. 01/17/22  Yes [provider]  fluticasone (FLONASE) 50 MCG/ACT nasal spray Place 1 spray into both nostrils daily.   Yes [provider]  hydrOXYzine (ATARAX) 10 MG tablet Take 1 tablet by mouth 3 (three) times daily as needed.   Yes [provider]  losartan (COZAAR) 100 MG tablet Take 0.5 tablets (50 mg total) by mouth daily. 11/06/21  Yes Brittany Kuper [name], [name]  montelukast (SINGULAIR) 10 MG tablet Take 10 mg by mouth at bedtime.  11/19/19  Yes [provider]  naloxone Karma Greaser) 4 MG/0.1ML LIQD nasal spray kit  08/18/18  Yes [provider]  Oxycodone HCl 10 MG TABS Take 10 mg by mouth 4 (four) times daily.   Yes [provider]  pantoprazole (PROTONIX) 40 MG tablet Take 1 tablet (40 mg total) by mouth 2 (two) times daily. Patient taking differently: Take 40 mg by mouth daily. 11/08/19  Yes Harold Hedge, MD  potassium chloride SA (KLOR-CON M) 20 MEQ tablet Take 2 tablets (40 mEq total) by mouth daily. On Friday and Sunday only 11/15/21  Yes Brittany Annett [name], [name]  pregabalin (LYRICA) 75 MG capsule Take 75 mg by mouth 3 (three) times daily.   Yes [provider]  spironolactone (ALDACTONE) 100 MG tablet Take 1 tablet (100 mg total) by mouth daily. MUST MAKE APPT FOR FURTHER REFILLS 02/28/21  Yes Brittany Price [name], [name]  torsemide (DEMADEX) 20 MG  tablet Take 2 tablets (40 mg total) by mouth daily. 09/21/21  Yes Brittany Jacinto [name], [name]  TRELEGY ELLIPTA 100-62.5-25 MCG/ACT AEPB Inhale 1 puff into the lungs daily. 04/13/21  Yes [provider]  triamcinolone ointment (KENALOG) 0.5 % Apply 1 application topically 2 (two) times daily. 08/23/19  Yes [provider]  venlafaxine (EFFEXOR) 37.5 MG tablet Take 75 mg by mouth daily.   Yes [provider]  budesonide-formoterol (SYMBICORT) 160-4.5 MCG/ACT inhaler Inhale 2 puffs into the lungs daily. Patient not taking: Reported on 01/31/2022 07/02/19   [provider]  fluticasone furoate-vilanterol (BREO ELLIPTA) 200-25 MCG/INH AEPB Inhale 1  puff into the lungs daily. Patient not taking: Reported on 12/12/2021    [provider]  furosemide (LASIX) 20 MG tablet Take 2 tablets by mouth daily. Patient not taking: Reported on 01/31/2022 01/11/22 03/12/22  [provider]  SPIRIVA RESPIMAT 2.5 MCG/ACT AERS Inhale 2 puffs into the lungs daily as needed. Patient not taking: Reported on 01/31/2022 07/22/19   [provider]  umeclidinium bromide (INCRUSE ELLIPTA) 62.5 MCG/INH AEPB Inhale 1 puff into the lungs daily. Patient not taking: Reported on 12/12/2021    [provider]   Review of Systems  Constitutional:  Positive for fatigue (easily). Negative for appetite change.  HENT:  Positive for congestion. Negative for postnasal drip and sore throat.   Eyes:  Positive for visual disturbance (blurry vision).  Respiratory:  Positive for cough (dry), shortness of breath and wheezing (on occasion). Negative for chest tightness.   Cardiovascular:  Negative for chest pain, palpitations and leg swelling.  Gastrointestinal:  Positive for nausea (due to hernia). Negative for abdominal distention and abdominal pain.  Endocrine: Negative.   Genitourinary: Negative.   Musculoskeletal:  Positive for arthralgias (legs) and back pain.  Skin: Negative.    Allergic/Immunologic: Negative.   Neurological:  Positive for light-headedness. Negative for dizziness.       Feels off-balance and stumbles  Hematological:  Negative for adenopathy. Does not bruise/bleed easily.  Psychiatric/Behavioral:  Positive for dysphoric mood and sleep disturbance (sleeping on 3 pillows; wearing oxygen @ 2L & CPAP). The patient is not nervous/anxious.    Vitals:   01/31/22 1132  BP: (!) 88/56  Pulse: 79  Resp: 18  SpO2: 92%  Weight: 224 lb 2 oz (101.7 kg)  Height: _0  (1.6 m)   Wt Readings from Last 3 Encounters:  01/31/22 224 lb 2 oz (101.7 kg)  12/12/21 231 lb 6 oz (105 kg)  11/15/21 226 lb 6 oz (102.7 kg)   Lab Results  Component Value Date   CREATININE 1.32 (H) 12/11/2021   CREATININE 0.80 11/16/2020   CREATININE 0.78 04/10/2020   Physical Exam Vitals and nursing note reviewed.  Constitutional:      Appearance: Normal appearance.  HENT:     Head: Normocephalic and atraumatic.  Eyes:     Comments: Puffiness noted below bilateral eyelids  Cardiovascular:     Rate and Rhythm: Normal rate and regular rhythm.  Pulmonary:     Effort: Pulmonary effort is normal. No respiratory distress.     Breath sounds: No wheezing or rales.  Abdominal:     General: There is no distension.     Palpations: Abdomen is soft.     Tenderness: There is no abdominal tenderness.  Musculoskeletal:        General: No tenderness.     Cervical back: Normal range of motion and neck supple.     Right lower leg: No tenderness. No edema.     Left lower leg: No tenderness. No edema.  Skin:    General: Skin is warm and dry.  Neurological:     General: No focal deficit present.     Mental Status: She is alert and oriented to person, place, and time.  Psychiatric:        Mood and Affect: Mood normal.        Behavior: Behavior normal.        Thought Content: Thought content normal.   Assessment & Plan:  1: Chronic heart failure with preseved ejection fraction without  structural changes- -  NYHA class III - euvolemic - weighing daily; reminded to call for an overnight weight gain of > 2 pounds or [name] weekly weight gain of > 5 pounds - weight down 7 pounds from last visit here 2 months ago - says that she's not adding salt  - instructed to start the furosemide but to NOT take torsemide anymore when she starts this - BNP 12/14/21 was 94.34  2: HTN- - BP low (88/56) - stop taking losartan and decrease spironolactone to 15m daily (1/2 tablet) - [facility] - BMP 12/22/21 reviewed and showed sodium 139, potassium 4.0, creatinine 0.88 and GFR 77  3: DM- - A1c 07/27/21 was 5.5% - no longer on insulin nor checking her glucose at home  4: ILD- - saw pulmonology ([facility]) 10/12/21 - receives rituximab infusion every 4 months  - wearing CPAP at bedtime along with oxygen at 2L  5: Tobacco use- - smoking 4 cigarettes daily - she says she's been told that she can not have her hernia surgery until she stops smoking - admits to being under [name] lot of stress; her husband is 126years older than her and she feels like he's developing some type of dementia - not using chantix - tobacco cessation discussed for 3 minutes with her   Medication bottles reviewed.   Due to multiple other upcoming appointments, will not make [name] return appointment at this time. Advised patient that she can call back at anytime for questions or to make another appointment and she ws comfortable with this plan.

## 2022-10-24 ENCOUNTER — Emergency Department: Payer: Medicaid Other

## 2022-10-24 ENCOUNTER — Inpatient Hospital Stay
Admission: EM | Admit: 2022-10-24 | Discharge: 2022-10-27 | DRG: 432 | Disposition: A | Discharge status: Home / Self Care | Payer: Medicaid Other | Attending: Internal Medicine | Admitting: Internal Medicine

## 2022-10-24 ENCOUNTER — Other Ambulatory Visit: Payer: Self-pay

## 2022-10-24 ENCOUNTER — Encounter: Payer: Self-pay | Admitting: Family Medicine

## 2022-10-24 DIAGNOSIS — K7040 Alcoholic hepatic failure without coma: Secondary | ICD-10-CM | POA: Diagnosis present

## 2022-10-24 DIAGNOSIS — J4489 Other specified chronic obstructive pulmonary disease: Secondary | ICD-10-CM | POA: Diagnosis present

## 2022-10-24 DIAGNOSIS — K56600 Partial intestinal obstruction, unspecified as to cause: Secondary | ICD-10-CM | POA: Diagnosis present

## 2022-10-24 DIAGNOSIS — F419 Anxiety disorder, unspecified: Secondary | ICD-10-CM | POA: Diagnosis present

## 2022-10-24 DIAGNOSIS — K7469 Other cirrhosis of liver: Secondary | ICD-10-CM | POA: Diagnosis not present

## 2022-10-24 DIAGNOSIS — B1920 Unspecified viral hepatitis C without hepatic coma: Secondary | ICD-10-CM | POA: Diagnosis not present

## 2022-10-24 DIAGNOSIS — G9341 Metabolic encephalopathy: Secondary | ICD-10-CM | POA: Diagnosis not present

## 2022-10-24 DIAGNOSIS — Z5320 Procedure and treatment not carried out because of patient's decision for unspecified reasons: Secondary | ICD-10-CM | POA: Diagnosis not present

## 2022-10-24 DIAGNOSIS — E876 Hypokalemia: Secondary | ICD-10-CM | POA: Diagnosis not present

## 2022-10-24 DIAGNOSIS — M3321 Polymyositis with respiratory involvement: Secondary | ICD-10-CM | POA: Diagnosis present

## 2022-10-24 DIAGNOSIS — J849 Interstitial pulmonary disease, unspecified: Secondary | ICD-10-CM | POA: Diagnosis not present

## 2022-10-24 DIAGNOSIS — I5032 Chronic diastolic (congestive) heart failure: Secondary | ICD-10-CM | POA: Diagnosis present

## 2022-10-24 DIAGNOSIS — I10 Essential (primary) hypertension: Secondary | ICD-10-CM | POA: Diagnosis not present

## 2022-10-24 DIAGNOSIS — E782 Mixed hyperlipidemia: Secondary | ICD-10-CM | POA: Diagnosis present

## 2022-10-24 DIAGNOSIS — F17210 Nicotine dependence, cigarettes, uncomplicated: Secondary | ICD-10-CM | POA: Diagnosis present

## 2022-10-24 DIAGNOSIS — K7682 Hepatic encephalopathy: Secondary | ICD-10-CM

## 2022-10-24 DIAGNOSIS — K56609 Unspecified intestinal obstruction, unspecified as to partial versus complete obstruction: Secondary | ICD-10-CM | POA: Diagnosis not present

## 2022-10-24 DIAGNOSIS — G8929 Other chronic pain: Secondary | ICD-10-CM | POA: Diagnosis not present

## 2022-10-24 DIAGNOSIS — Z9981 Dependence on supplemental oxygen: Secondary | ICD-10-CM

## 2022-10-24 DIAGNOSIS — I509 Heart failure, unspecified: Secondary | ICD-10-CM

## 2022-10-24 DIAGNOSIS — Z597 Insufficient social insurance and welfare support: Secondary | ICD-10-CM

## 2022-10-24 DIAGNOSIS — K219 Gastro-esophageal reflux disease without esophagitis: Secondary | ICD-10-CM | POA: Diagnosis present

## 2022-10-24 DIAGNOSIS — Z7951 Long term (current) use of inhaled steroids: Secondary | ICD-10-CM

## 2022-10-24 DIAGNOSIS — I110 Hypertensive heart disease with heart failure: Secondary | ICD-10-CM

## 2022-10-24 DIAGNOSIS — M549 Dorsalgia, unspecified: Secondary | ICD-10-CM

## 2022-10-24 DIAGNOSIS — E119 Type 2 diabetes mellitus without complications: Secondary | ICD-10-CM | POA: Diagnosis present

## 2022-10-24 DIAGNOSIS — G4733 Obstructive sleep apnea (adult) (pediatric): Secondary | ICD-10-CM | POA: Diagnosis present

## 2022-10-24 DIAGNOSIS — J449 Chronic obstructive pulmonary disease, unspecified: Secondary | ICD-10-CM | POA: Diagnosis not present

## 2022-10-24 DIAGNOSIS — Z79899 Other long term (current) drug therapy: Secondary | ICD-10-CM

## 2022-10-24 DIAGNOSIS — Z9049 Acquired absence of other specified parts of digestive tract: Secondary | ICD-10-CM

## 2022-10-24 DIAGNOSIS — J90 Pleural effusion, not elsewhere classified: Secondary | ICD-10-CM

## 2022-10-24 DIAGNOSIS — I959 Hypotension, unspecified: Secondary | ICD-10-CM | POA: Diagnosis not present

## 2022-10-24 DIAGNOSIS — K7031 Alcoholic cirrhosis of liver with ascites: Principal | ICD-10-CM | POA: Diagnosis present

## 2022-10-24 DIAGNOSIS — Z6841 Body Mass Index (BMI) 40.0 and over, adult: Secondary | ICD-10-CM | POA: Diagnosis not present

## 2022-10-24 DIAGNOSIS — E785 Hyperlipidemia, unspecified: Secondary | ICD-10-CM

## 2022-10-24 DIAGNOSIS — Z79891 Long term (current) use of opiate analgesic: Secondary | ICD-10-CM

## 2022-10-24 DIAGNOSIS — K766 Portal hypertension: Secondary | ICD-10-CM | POA: Diagnosis present

## 2022-10-24 DIAGNOSIS — F4310 Post-traumatic stress disorder, unspecified: Secondary | ICD-10-CM | POA: Diagnosis present

## 2022-10-24 DIAGNOSIS — Z8249 Family history of ischemic heart disease and other diseases of the circulatory system: Secondary | ICD-10-CM

## 2022-10-24 DIAGNOSIS — R188 Other ascites: Secondary | ICD-10-CM | POA: Diagnosis not present

## 2022-10-24 LAB — TROPONIN I (HIGH SENSITIVITY)
Troponin I (High Sensitivity): 6 ng/L (ref <18)
Troponin I (High Sensitivity): 5 ng/L (ref <18)

## 2022-10-24 LAB — COMPREHENSIVE METABOLIC PANEL
ALT: 16 U/L (ref 0–44)
AST: 30 U/L (ref 15–41)
Albumin: 3.3 g/dL — ABNORMAL LOW (ref 3.5–5.0)
Alkaline Phosphatase: 81 U/L (ref 38–126)
Anion gap: 9 (ref 5–15)
BUN: 15 mg/dL (ref 6–20)
CO2: 27 mmol/L (ref 22–32)
Calcium: 9 mg/dL (ref 8.9–10.3)
Chloride: 94 mmol/L — ABNORMAL LOW (ref 98–111)
Creatinine, Ser: 0.94 mg/dL (ref 0.44–1.00)
GFR, Estimated: >60 mL/min (ref >60)
Glucose, Bld: 128 mg/dL — ABNORMAL HIGH (ref 70–99)
Potassium: 3.6 mmol/L (ref 3.5–5.1)
Sodium: 130 mmol/L — ABNORMAL LOW (ref 135–145)
Total Bilirubin: 1.8 mg/dL — ABNORMAL HIGH (ref 0.3–1.2)
Total Protein: 8 g/dL (ref 6.5–8.1)

## 2022-10-24 LAB — CBC WITH DIFFERENTIAL/PLATELET
Abs Immature Granulocytes: 0.03 10*3/uL (ref 0.00–0.07)
Basophils Absolute: 0 10*3/uL (ref 0.0–0.1)
Basophils Relative: 1 %
Eosinophils Absolute: 0.1 10*3/uL (ref 0.0–0.5)
Eosinophils Relative: 1 %
HCT: 44.1 % (ref 36.0–46.0)
Hemoglobin: 14 g/dL (ref 12.0–15.0)
Immature Granulocytes: 0 %
Lymphocytes Relative: 12 %
Lymphs Abs: 0.9 10*3/uL (ref 0.7–4.0)
MCH: 28.5 pg (ref 26.0–34.0)
MCHC: 31.7 g/dL (ref 30.0–36.0)
MCV: 89.8 fL (ref 80.0–100.0)
Monocytes Absolute: 1 10*3/uL (ref 0.1–1.0)
Monocytes Relative: 13 %
Neutro Abs: 5.5 10*3/uL (ref 1.7–7.7)
Neutrophils Relative %: 73 %
Platelets: 172 10*3/uL (ref 150–400)
RBC: 4.91 MIL/uL (ref 3.87–5.11)
RDW: 15.9 % — ABNORMAL HIGH (ref 11.5–15.5)
WBC: 7.5 10*3/uL (ref 4.0–10.5)
nRBC: 0 % (ref 0.0–0.2)

## 2022-10-24 LAB — URINALYSIS, W/ REFLEX TO CULTURE (INFECTION SUSPECTED)
Bilirubin Urine: NEGATIVE
Glucose, UA: NEGATIVE mg/dL
Hgb urine dipstick: NEGATIVE
Ketones, ur: NEGATIVE mg/dL
Leukocytes,Ua: NEGATIVE
Nitrite: NEGATIVE
Protein, ur: NEGATIVE mg/dL
Specific Gravity, Urine: 1.017 (ref 1.005–1.030)
pH: 5 (ref 5.0–8.0)

## 2022-10-24 LAB — AMMONIA: Ammonia: 116 umol/L — ABNORMAL HIGH (ref 9–35)

## 2022-10-24 LAB — URINE DRUG SCREEN, QUALITATIVE (ARMC ONLY)
Amphetamines, Ur Screen: NOT DETECTED
Barbiturates, Ur Screen: NOT DETECTED
Benzodiazepine, Ur Scrn: NOT DETECTED
Cannabinoid 50 Ng, Ur ~~LOC~~: NOT DETECTED
Cocaine Metabolite,Ur ~~LOC~~: NOT DETECTED
MDMA (Ecstasy)Ur Screen: NOT DETECTED
Methadone Scn, Ur: NOT DETECTED
Opiate, Ur Screen: POSITIVE — AB
Phencyclidine (PCP) Ur S: NOT DETECTED
Tricyclic, Ur Screen: NOT DETECTED

## 2022-10-24 LAB — GLUCOSE, CAPILLARY
Glucose-Capillary: 79 mg/dL (ref 70–99)
Glucose-Capillary: 79 mg/dL (ref 70–99)
Glucose-Capillary: 81 mg/dL (ref 70–99)

## 2022-10-24 LAB — MRSA NEXT GEN BY PCR, NASAL: MRSA by PCR Next Gen: NOT DETECTED

## 2022-10-24 LAB — ETHANOL: Alcohol, Ethyl (B): <10 mg/dL (ref <10)

## 2022-10-24 MED ORDER — ALBUTEROL SULFATE (2.5 MG/3ML) 0.083% IN NEBU: 2.5000 mg | INHALATION_SOLUTION | RESPIRATORY_TRACT | Status: DC | PRN

## 2022-10-24 MED ORDER — SODIUM CHLORIDE 0.9 % IV BOLUS
250.0000 mL | Freq: Once | INTRAVENOUS | Status: AC
  Administered 2022-10-24: 250 mL via INTRAVENOUS

## 2022-10-24 MED ORDER — ONDANSETRON HCL 4 MG/2ML IJ SOLN
4.0000 mg | Freq: Four times a day (QID) | INTRAMUSCULAR | Status: DC | PRN
  Administered 2022-10-25 – 2022-10-26 (×3): 4 mg via INTRAVENOUS
  Filled 2022-10-24 (×3): qty 2

## 2022-10-24 MED ORDER — CHLORHEXIDINE GLUCONATE CLOTH 2 % EX PADS
6.0000 | MEDICATED_PAD | Freq: Every day | CUTANEOUS | Status: DC
  Administered 2022-10-24: 6 via TOPICAL
  Filled 2022-10-24: qty 6

## 2022-10-24 MED ORDER — NOREPINEPHRINE 4 MG/250ML-% IV SOLN: 2.0000 ug/min | INTRAVENOUS | Status: DC

## 2022-10-24 MED ORDER — LACTULOSE 10 GM/15ML PO SOLN: 30.0000 g | Freq: Four times a day (QID) | ORAL | Status: DC

## 2022-10-24 MED ORDER — IOHEXOL 300 MG/ML  SOLN
100.0000 mL | Freq: Once | INTRAMUSCULAR | Status: AC | PRN
  Administered 2022-10-24: 100 mL via INTRAVENOUS

## 2022-10-24 MED ORDER — SODIUM CHLORIDE 0.9 % IV SOLN: 250.0000 mL | INTRAVENOUS | Status: DC

## 2022-10-24 MED ORDER — LACTULOSE 10 GM/15ML PO SOLN
30.0000 g | Freq: Four times a day (QID) | ORAL | Status: AC
  Administered 2022-10-24: 30 g

## 2022-10-24 MED ORDER — ENOXAPARIN SODIUM 40 MG/0.4ML IJ SOSY
40.0000 mg | PREFILLED_SYRINGE | INTRAMUSCULAR | Status: DC
  Administered 2022-10-25: 40 mg via SUBCUTANEOUS
  Filled 2022-10-24 (×2): qty 0.4

## 2022-10-24 MED ORDER — ONDANSETRON HCL 4 MG PO TABS
4.0000 mg | ORAL_TABLET | Freq: Four times a day (QID) | ORAL | Status: DC | PRN
  Administered 2022-10-27: 4 mg via ORAL
  Filled 2022-10-24: qty 1

## 2022-10-24 NOTE — ED Triage Notes (Signed)
Arrived by EMS from home with family c/o AMS.   EMS vitals:  113 systolic 137CBG 97.9oral 93% RA 95 HR

## 2022-10-24 NOTE — ED Provider Notes (Signed)
[facility] Provider Note    Event Date/Time   First MD Initiated Contact with Patient 10/24/22 [phone]     (approximate)   History   Altered Mental Status   HPI  [name] is a 57 y.o. 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  Accompanied by daughter who provides history.  She reports that patient has had some ongoing nausea and vomiting since her discharge on 6/21, but actually began to feel better yesterday.  Had also reported some shortness of breath over the past few days.  Today, she received a call from her dad that the patient was confused and not appropriately responding.  She reports that at baseline, patient does not have any cognitive deficits.  Patient was admitted for septic shock with respiratory failure in January at outside hospital and patient reports that there was discussion of possible hepatic encephalopathy at that time.  Had paracentesis during her most recent admission, working on getting this set up as an outpatient.     Physical Exam   Triage Vital Signs: ED Triage Vitals  Enc Vitals Group     BP 10/24/22 0948 132/89     Pulse Rate 10/24/22 0948 95     Resp 10/24/22 0948 20     Temp 10/24/22 0948 97.8 F (36.6 C)     Temp Source 10/24/22 0948 Oral     SpO2 10/24/22 0948 96 %     Weight 10/24/22 0949 175 lb (79.4 kg)     Height 10/24/22 0949 5\' 3"  (1.6 m)     Head Circumference --      Peak Flow --      Pain Score 10/24/22 0949 0     Pain Loc --      Pain Edu? --      Excl. in GC? --     Most recent vital signs: Vitals:   10/24/22 1522 10/24/22 1525  BP: (!) 86/66   Pulse: 85   Resp: (!) 28   Temp: 97.6 F (36.4 C)   SpO2: 90% 95%     General: Somnolent but  arousable, appears fatigued CV:  Regular rate, good peripheral perfusion.  Resp:  Limited respiratory effort, but lungs overall clear to auscultation Abd:  Soft, somewhat distended, not obviously tender to palpation Neuro:  No gross facial asymmetry, moving extremities spontaneously with generalized weakness, slow speech not clearly dysarthric, able to tell me her name, identify her daughter, but not able to tell me where we are, slow movements without clear asterixis on exam   ED Results / Procedures / Treatments   Labs (all labs ordered are listed, but only abnormal results are displayed) Labs Reviewed  CBC WITH DIFFERENTIAL/PLATELET - Abnormal; Notable for the following components:      Result Value   RDW 15.9 (*)    All other components within normal limits  COMPREHENSIVE METABOLIC PANEL - Abnormal; Notable for the following components:   Sodium 130 (*)    Chloride 94 (*)    Glucose, Bld 128 (*)    Albumin 3.3 (*)    Total Bilirubin 1.8 (*)    All other components within normal limits  URINE DRUG SCREEN, QUALITATIVE ([facility] ONLY) - Abnormal; Notable for the following components:  Opiate, Ur Screen POSITIVE (*)    All other components within normal limits  URINALYSIS, W/ REFLEX TO CULTURE (INFECTION SUSPECTED) - Abnormal; Notable for the following components:   Color, Urine AMBER (*)    APPearance HAZY (*)    Bacteria, UA FEW (*)    All other components within normal limits  AMMONIA - Abnormal; Notable for the following components:   Ammonia 116 (*)    All other components within normal limits  ETHANOL  TROPONIN I (HIGH SENSITIVITY)  TROPONIN I (HIGH SENSITIVITY)     EKG EKG independently reviewed interpreted by myself (ER attending) demonstrates:  EKG demonstrates sinus rhythm at a rate of 95, PR 184, QRS 89, QTc 517, no acute ST changes  RADIOLOGY Imaging independently reviewed and interpreted by myself demonstrates:  Chest x-Salah Burlison without focal pneumonia Head CT  without acute bleed CT abdomen pelvis with decompensated cirrhosis  PROCEDURES:  Critical Care performed: No  Procedures   MEDICATIONS ORDERED IN ED: Medications  lactulose (CHRONULAC) 10 GM/15ML solution 30 g (has no administration in time range)  iohexol (OMNIPAQUE) 300 MG/ML solution 100 mL (100 mLs Intravenous Contrast Given 10/24/22 1103)     IMPRESSION / MDM / ASSESSMENT AND PLAN / ED COURSE  I reviewed the triage vital signs and the nursing notes.  Differential diagnosis includes, but is not limited to, acute intracranial process including bleed, mass, hepatic encephalopathy, electrolyte abnormality, anemia, recurrent small bowel obstruction, other acute intra-abdominal process  Patient's presentation is most consistent with acute presentation with potential threat to life or bodily function.  57 year old female presenting with acute altered mental status with recent admission for vomiting and small bowel obstruction.  Will obtain labs, CT head, CT abdomen pelvis, and chest x-Berlie Persky to further evaluate.   Lab work with reassuring CBC, CMP with mild hyponatremia.  UDS positive for opiates.  Negative EtOH.  UA without evidence of infection.  Troponin normal.  Ammonia did return elevated at 116, previously 49 two years ago. Imaging notable for decompensated cirrhosis as well as thickening of the jejunal bowel loops with possible partial small bowel obstruction.  Case reviewed with Dr. Aleen Campi and PA Tomasa Blase with surgery.  He did recommend making the patient n.p.o., but did feel that she was okay to receive lactulose as indicated regarding her encephalopathy and did not feel that she would be a surgical candidate.  Do feel that her confusion is most likely related to hepatic encephalopathy in the setting of decompensated cirrhosis.  Will reach out to hospitalist team to discuss admission.  Case discussed with hospitalist team.  They will evaluate the patient for anticipated admission.       FINAL CLINICAL IMPRESSION(S) / ED DIAGNOSES   Final diagnoses:  Hepatic encephalopathy (HCC)     Rx / DC Orders   ED Discharge Orders     None        Note:  This document was prepared using Dragon voice recognition software and may include unintentional dictation errors.   Trinna Post, MD 10/24/22 [phone]

## 2022-10-24 NOTE — Consult Note (Signed)
Date of Consultation:  10/24/2022  Requesting Physician:  Trinna Post, MD  Reason for Consultation:  Small bowel obstruction  History of Present Illness: [name] is a 57 y.o. female with history of decompensated liver cirrhosis, CHF, HTN, COPD, ILD on 2L [place] at home, and DM.  She was brought by EMS today due to altered mental status that the family had observed.  She was recently admitted two weeks ago with emesis and volume overload and also found to have small bowel obstruction with significant ascites.  She was treated with NG tube decompression and paracentesis of 6L of fluid.  Eventually discharged home on 10/18/22.  She had been doing well initially at home.  Her history is unclear given her mental status, but she reports not having bowel movement in 2-3 days and also associated nausea/emesis.  Today she denies any nausea and currently denies any abdominal pain.  Her workup in the ED showed normal Cr of 0.94, but elevated total bilirubin of 1.8 and significantly elevated ammonia level of 116.  CT scan of head was negative for acute intracranial pathology, and CT abdomen/pelvis showed again significant ascites with diffuse small bowel wall thickening and dilated proximal small bowel.  On personal view of these images, they are very similar compared to her CT at [facility].     Past Medical History: Past Medical History:  Diagnosis Date   Asthma    CHF (congestive heart failure) (HCC)    COPD (chronic obstructive pulmonary disease) (HCC)    Diabetes mellitus without complication (HCC)    Diastolic dysfunction    a. 03/2018 Echo [facility]): EF >55%, Gr1 DD. Nl RV fxn.   Esophageal varices (HCC)    a. 10/2019 EGD: gr III esoph varices s/p banding.   Essential hypertension    GI bleed    HCV (hepatitis C virus)    Hematemesis    a. 10/2019 EGD: Gr III esoph varices, incompletely eradicated, banded. Non-bleeding ulcers tx w/ argon plasma coag. Single non-bleeding angiodysplastic lesion in duodenum  tx w/ APC.   Hepatic cirrhosis (HCC)    a. h/o ETOH/Hep C.   Hypertension    Interstitial lung disease (HCC)    a. Followed @ [facility] pulm. Prev on cellcept (caused GI side effects), Imuran (GI se), myfortic (elev LFTs). More recently on rituximab.   Mixed hyperlipidemia    Morbid obesity (HCC)    O2 dependent    PTSD (post-traumatic stress disorder)    Sleep apnea    Tobacco abuse      Past Surgical History: Past Surgical History:  Procedure Laterality Date   CHOLECYSTECTOMY     ESOPHAGOGASTRODUODENOSCOPY (EGD) WITH PROPOFOL N/A 11/04/2019   Procedure: ESOPHAGOGASTRODUODENOSCOPY (EGD) WITH PROPOFOL;  Surgeon: [name], MD;  Location: [facility] ENDOSCOPY;  Service: Endoscopy;  Laterality: N/A;   ORIF TIBIA PLATEAU Left 07/02/2015   Procedure: OPEN REDUCTION INTERNAL FIXATION (ORIF) TIBIAL PLATEAU;  Surgeon: Juanell Fairly, MD;  Location: [facility] ORS;  Service: Orthopedics;  Laterality: Left;    Home Medications: Prior to Admission medications   Medication Sig Start Date End Date Taking? Authorizing Provider  albuterol (PROVENTIL HFA;VENTOLIN HFA) 108 (90 Base) MCG/ACT inhaler Inhale 1-2 puffs into the lungs every 6 (six) hours as needed for wheezing or shortness of breath. 03/10/13  Yes [provider]  albuterol (PROVENTIL) (2.5 MG/3ML) 0.083% nebulizer solution Take 2.5 mg by nebulization every 4 (four) hours as needed. 01/03/20  Yes [provider]  atorvastatin (LIPITOR) 10 MG tablet Take 10  mg by mouth daily.  04/24/18  Yes [provider]  fluticasone (FLONASE) 50 MCG/ACT nasal spray Place 1 spray into both nostrils daily.   Yes [provider]  furosemide (LASIX) 20 MG tablet Take 40 mg by mouth daily. 06/28/22  Yes [provider]  hydrOXYzine (ATARAX) 10 MG tablet Take 1 tablet by mouth 3 (three) times daily as needed.   Yes [provider]  montelukast (SINGULAIR) 10 MG tablet Take 10 mg by mouth at bedtime.  11/19/19  Yes [provider]  naloxone Jonelle Sports) 4 MG/0.1ML LIQD nasal spray kit  08/18/18  Yes [provider]  pantoprazole (PROTONIX) 40 MG tablet Take 1 tablet (40 mg total) by mouth 2 (two) times daily. Patient taking differently: Take 40 mg by mouth daily. 11/08/19  Yes Jae Dire, MD  potassium chloride SA (KLOR-CON M) 20 MEQ tablet Take 2 tablets (40 mEq total) by mouth daily. On Friday and Sunday only 11/15/21  Yes Hackney, Tina A, FNP  pregabalin (LYRICA) 25 MG capsule Take 25 mg by mouth 3 (three) times daily. 06/28/22 06/28/23 Yes [provider]  spironolactone (ALDACTONE) 25 MG tablet Take 50 mg by mouth daily. 06/28/22  Yes [provider]  venlafaxine XR (EFFEXOR-XR) 75 MG 24 hr capsule Take 150 mg by mouth daily. 09/23/22  Yes [provider]  budesonide-formoterol (SYMBICORT) 160-4.5 MCG/ACT inhaler Inhale 2 puffs into the lungs daily. Patient not taking: Reported on 01/31/2022 07/02/19   [provider]  carvedilol (COREG) 3.125 MG tablet Take 3.125 mg by mouth daily. Patient not taking: Reported on 10/24/2022    [provider]  clonazePAM (KLONOPIN) 0.5 MG tablet Take 0.5 mg by mouth 2 (two) times daily. Patient not taking: Reported on 10/24/2022 01/17/22   [provider]  escitalopram (LEXAPRO) 20 MG tablet Take 20 mg by mouth every morning. Patient not taking: Reported on 10/24/2022 01/17/22   [provider]  fluticasone furoate-vilanterol (BREO ELLIPTA) 200-25 MCG/INH AEPB Inhale 1 puff into the lungs daily. Patient not taking: Reported on 12/12/2021    [provider]  Oxycodone HCl 10 MG TABS Take 10 mg by mouth 4 (four) times daily. Patient not taking: Reported on 10/24/2022    [provider]  pregabalin (LYRICA) 75 MG capsule Take 75 mg by mouth 3 (three) times daily. Patient not taking: Reported on 10/24/2022    [provider]  SPIRIVA RESPIMAT 2.5 MCG/ACT AERS Inhale 2 puffs into the lungs daily  as needed. Patient not taking: Reported on 01/31/2022 07/22/19   [provider]  spironolactone (ALDACTONE) 100 MG tablet Take 1 tablet (100 mg total) by mouth daily. MUST MAKE APPT FOR FURTHER REFILLS Patient not taking: Reported on 10/24/2022 02/28/21   Clarisa Kindred A, FNP  TRELEGY ELLIPTA 100-62.5-25 MCG/ACT AEPB Inhale 1 puff into the lungs daily. Patient not taking: Reported on 10/24/2022 04/13/21   [provider]  triamcinolone ointment (KENALOG) 0.5 % Apply 1 application topically 2 (two) times daily. Patient not taking: Reported on 10/24/2022 08/23/19   [provider]  umeclidinium bromide (INCRUSE ELLIPTA) 62.5 MCG/INH AEPB Inhale 1 puff into the lungs daily. Patient not taking: Reported on 12/12/2021    [provider]  venlafaxine (EFFEXOR) 37.5 MG tablet Take 75 mg by mouth daily. Patient not taking: Reported on 10/24/2022    [provider]    Allergies: Allergies  Allergen Reactions   Other Other (See Comments)    Gel capsules causes GI  problems   Citrated Iron     GI upset   Gelatin    Mycophenolate Sodium Other (See Comments)    transaminitis   Azathioprine Nausea Only   Mycophenolate Mofetil Nausea Only   Tacrolimus Diarrhea and Nausea Only    Social History:  reports that she has been smoking cigarettes. She has a 9.00 pack-year smoking history. She has never used smokeless tobacco. She reports current alcohol use. She reports that she does not currently use drugs.   Family History: Family History  Problem Relation Age of Onset   Heart failure Mother     Review of Systems: Review of Systems  Unable to perform ROS: Mental status change    Physical Exam BP (!) 86/66   Pulse 85   Temp 97.6 F (36.4 C) (Axillary)   Resp (!) 28   Ht 5\' 3"  (1.6 m)   Wt 79.4 kg   LMP 11/03/2009   SpO2 95%   BMI 31.00 kg/m  CONSTITUTIONAL: Altered/confused, but no acute distress. NECK: Trachea is midline, and there is no jugular  venous distension. RESPIRATORY:  On 2L  at baseline.  Normal respiratory effort without pathologic use of accessory muscles. CARDIOVASCULAR: Regular rhythm and rate. GI: The abdomen is soft, but distended with fluid wave, with localized discomfort at the umbilicus where she has an incarcerated umbilical hernia.  No epigastric pain.  MUSCULOSKELETAL:  Normal muscle strength and tone in all four extremities.  No peripheral edema or cyanosis. SKIN: Skin turgor is normal. There are no pathologic skin lesions.  NEUROLOGIC:  Motor and sensation is grossly normal.  Cranial nerves are grossly intact. PSYCH:  Confused, able to answer some questions, but falls asleep easily  Laboratory Analysis: Results for orders placed or performed during the hospital encounter of 10/24/22 (from the past 24 hour(s))  CBC with Differential     Status: Abnormal   Collection Time: 10/24/22 10:13 AM  Result Value Ref Range   WBC 7.5 4.0 - 10.5 K/uL   RBC 4.91 3.87 - 5.11 MIL/uL   Hemoglobin 14.0 12.0 - 15.0 g/dL   HCT 60.4 54.0 - 98.1 %   MCV 89.8 80.0 - 100.0 fL   MCH 28.5 26.0 - 34.0 pg   MCHC 31.7 30.0 - 36.0 g/dL   RDW 19.1 (H) 47.8 - 29.5 %   Platelets 172 150 - 400 K/uL   nRBC 0.0 0.0 - 0.2 %   Neutrophils Relative % 73 %   Neutro Abs 5.5 1.7 - 7.7 K/uL   Lymphocytes Relative 12 %   Lymphs Abs 0.9 0.7 - 4.0 K/uL   Monocytes Relative 13 %   Monocytes Absolute 1.0 0.1 - 1.0 K/uL   Eosinophils Relative 1 %   Eosinophils Absolute 0.1 0.0 - 0.5 K/uL   Basophils Relative 1 %   Basophils Absolute 0.0 0.0 - 0.1 K/uL   Immature Granulocytes 0 %   Abs Immature Granulocytes 0.03 0.00 - 0.07 K/uL  Comprehensive metabolic panel     Status: Abnormal   Collection Time: 10/24/22 10:13 AM  Result Value Ref Range   Sodium 130 (L) 135 - 145 mmol/L   Potassium 3.6 3.5 - 5.1 mmol/L   Chloride 94 (L) 98 - 111 mmol/L   CO2 27 22 - 32 mmol/L   Glucose, Bld 128 (H) 70 - 99 mg/dL   BUN 15 6 - 20 mg/dL   Creatinine,  Ser 6.21 0.44 - 1.00 mg/dL   Calcium 9.0 8.9 - 30.8 mg/dL  Total Protein 8.0 6.5 - 8.1 g/dL   Albumin 3.3 (L) 3.5 - 5.0 g/dL   AST 30 15 - 41 U/L   ALT 16 0 - 44 U/L   Alkaline Phosphatase 81 38 - 126 U/L   Total Bilirubin 1.8 (H) 0.3 - 1.2 mg/dL   GFR, Estimated >60 >63 mL/min   Anion gap 9 5 - 15  Urine Drug Screen, Qualitative     Status: Abnormal   Collection Time: 10/24/22 10:13 AM  Result Value Ref Range   Tricyclic, Ur Screen NONE DETECTED NONE DETECTED   Amphetamines, Ur Screen NONE DETECTED NONE DETECTED   MDMA (Ecstasy)Ur Screen NONE DETECTED NONE DETECTED   Cocaine Metabolite,Ur [place] NONE DETECTED NONE DETECTED   Opiate, Ur Screen POSITIVE (A) NONE DETECTED   Phencyclidine (PCP) Ur S NONE DETECTED NONE DETECTED   Cannabinoid 50 Ng, Ur [place] NONE DETECTED NONE DETECTED   Barbiturates, Ur Screen NONE DETECTED NONE DETECTED   Benzodiazepine, Ur Scrn NONE DETECTED NONE DETECTED   Methadone Scn, Ur NONE DETECTED NONE DETECTED  Ethanol     Status: None   Collection Time: 10/24/22 10:13 AM  Result Value Ref Range   Alcohol, Ethyl (B) <10 <10 mg/dL  Urinalysis, w/ Reflex to Culture (Infection Suspected) -Urine, Clean Catch     Status: Abnormal   Collection Time: 10/24/22 10:13 AM  Result Value Ref Range   Specimen Source URINE, CLEAN CATCH    Color, Urine AMBER (A) YELLOW   APPearance HAZY (A) CLEAR   Specific Gravity, Urine 1.017 1.005 - 1.030   pH 5.0 5.0 - 8.0   Glucose, UA NEGATIVE NEGATIVE mg/dL   Hgb urine dipstick NEGATIVE NEGATIVE   Bilirubin Urine NEGATIVE NEGATIVE   Ketones, ur NEGATIVE NEGATIVE mg/dL   Protein, ur NEGATIVE NEGATIVE mg/dL   Nitrite NEGATIVE NEGATIVE   Leukocytes,Ua NEGATIVE NEGATIVE   RBC / HPF 0-5 0 - 5 RBC/hpf   WBC, UA 0-5 0 - 5 WBC/hpf   Bacteria, UA FEW (A) NONE SEEN   Squamous Epithelial / HPF 0-5 0 - 5 /HPF   Mucus PRESENT    Hyaline Casts, UA PRESENT   Troponin I (High Sensitivity)     Status: None   Collection Time: 10/24/22 10:13 AM   Result Value Ref Range   Troponin I (High Sensitivity) 6 <18 ng/L  Ammonia     Status: Abnormal   Collection Time: 10/24/22 10:13 AM  Result Value Ref Range   Ammonia 116 (H) 9 - 35 umol/L  Troponin I (High Sensitivity)     Status: None   Collection Time: 10/24/22 12:27 PM  Result Value Ref Range   Troponin I (High Sensitivity) 5 <18 ng/L    Imaging: CT ABDOMEN PELVIS W CONTRAST  Result Date: 10/24/2022 CLINICAL DATA:  Acute generalized abdominal pain. EXAM: CT ABDOMEN AND PELVIS WITH CONTRAST TECHNIQUE: Multidetector CT imaging of the abdomen and pelvis was performed using the standard protocol following bolus administration of intravenous contrast. RADIATION DOSE REDUCTION: This exam was performed according to the departmental dose-optimization program which includes automated exposure control, adjustment of the mA and/or kV according to patient size and/or use of iterative reconstruction technique. CONTRAST:  OMNIPAQUE IOHEXOL 300 MG/ML  SOLN COMPARISON:  February 02, 2013. FINDINGS: Lower chest: No acute abnormality. Hepatobiliary: Status post cholecystectomy. No biliary dilatation is noted. Nodular hepatic margins are noted consistent with hepatic cirrhosis. Pancreas: Unremarkable. No pancreatic ductal dilatation or surrounding inflammatory changes. Spleen: Normal in size without focal  abnormality. Adrenals/Urinary Tract: Adrenal glands are unremarkable. Kidneys are normal, without renal calculi, focal lesion, or hydronephrosis. Bladder is unremarkable. Stomach/Bowel: Stomach is unremarkable. The appendix appears normal. There is severe duodenal and proximal jejunal dilatation most likely due to partial obstruction from significant wall thickening and inflammation or edema involving proximal jejunal loops. The colon is unremarkable. Vascular/Lymphatic: Aortic atherosclerosis. No enlarged abdominal or pelvic lymph nodes. Multiple collateral veins are noted throughout the abdomen consistent  with portal venous hypertension. Reproductive: Uterus and bilateral adnexa are unremarkable. Other: Mild ascites is noted. Small fat containing periumbilical hernia is noted. Musculoskeletal: No acute or significant osseous findings. IMPRESSION: Hepatic cirrhosis with ascites and portal venous hypertension. Moderate wall thickening is seen involving jejunal bowel loops most likely due to inflammation or edema, and this appears to result in at least partial obstruction of the more proximal jejunal and duodenal small bowel. Aortic Atherosclerosis (ICD10-I70.0). Electronically Signed   By: Lupita Raider M.D.   On: 10/24/2022 11:57   CT Head Wo Contrast  Result Date: 10/24/2022 CLINICAL DATA:  Altered mental status EXAM: CT HEAD WITHOUT CONTRAST TECHNIQUE: Contiguous axial images were obtained from the base of the skull through the vertex without intravenous contrast. RADIATION DOSE REDUCTION: This exam was performed according to the departmental dose-optimization program which includes automated exposure control, adjustment of the mA and/or kV according to patient size and/or use of iterative reconstruction technique. COMPARISON:  10/14/2009 FINDINGS: Brain: No evidence of acute infarction, hemorrhage, mass, mass effect, or midline shift. No hydrocephalus or extra-axial fluid collection. Partial empty sella. Normal craniocervical junction. Vascular: No hyperdense vessel. Skull: Negative for fracture or focal lesion. Sinuses/Orbits: No acute finding. Other: The mastoid air cells are well aerated. IMPRESSION: No acute intracranial process. Electronically Signed   By: Wiliam Ke M.D.   On: 10/24/2022 11:50   DG Chest Port 1 View  Result Date: 10/24/2022 CLINICAL DATA:  Altered mental status.  Shortness of breath EXAM: PORTABLE CHEST 1 VIEW COMPARISON:  01/25/2020 FINDINGS: Underinflation. Film is rotated to the left and is slightly kyphotic. No pneumothorax or effusion. Normal cardiopericardial silhouette.  Stable interstitial changes and bronchovascular crowding. Overlapping cardiac leads. Degenerative changes of the spine IMPRESSION: Stable interstitial changes.  Underinflation. Electronically Signed   By: Karen Kays M.D.   On: 10/24/2022 10:25    Assessment and Plan: This is a 57 y.o. female with possible small bowel obstruction.  --The patient is currently accompanied by a close friend.  Discussed with them the findings on her labs and CT scans.  Her altered mental status is likely from hepatic encephalopathy with very high ammonia level.  Her CT shows significant ascites, and I think this in turn is causing irritation and inflammation of the small bowel, leading to the appearance of bowel obstruction.  She denies any current nausea or abdominal pain.  Typically would say could keep NPO for now and forgo NG tube placement.  However, given her altered mental status, if she does have emesis later she may be at high risk for aspiration event.  As such, would empirically place NG tube to help with decompression.  Would recommend also consulting with IR for paracentesis and also with GI for further management of her hepatic encephalopathy and cirrhosis.  Given all her medical issues, she would be a poor candidate for surgery and very high risk of mortality if any surgery is done.  Will order repeat KUB for AM.  Will continue to follow with you.  I spent  60 minutes dedicated to the care of this patient on the date of this encounter to include pre-visit review of records, face-to-face time with the patient discussing diagnosis and management, and any post-visit coordination of care.   Howie Ill, MD [facility] Pg:  [phone]

## 2022-10-24 NOTE — Consult Note (Signed)
NAME:  [name], MRN:  [id number], DOB:  Jun 07, 1965, LOS: 0 ADMISSION DATE:  10/24/2022  CHIEF COMPLAINT:  lethargic  BRIEF SYNOPSIS Admitted for hepatic encephalopathy, SBO  History of Present Illness:   57 y.o. female with medical history significant of hepatitis C with subsequent cirrhosis, diastolic heart failure,  COPD, ILD (2 L oxygen), HTN, GERD, chronic back pain who presented to ED for altered mental status.    She was just hospitalized for a small bowel obstruction and was vomiting green stuff.  Patient admitted to [facility] for SBO and volume overload.    Since getting home from the hospital she has been becoming short of breath.   reported that she had been confused and was not responding as she would normally.  Last paracentesis was during her admission at [facility].    She smokes cigarettes but stopped drinking alcohol a few months ago. unsure when the last EtOH drink was. Her history is unclear given her mental status,  not having bowel movement in 2-3 days and also associated nausea/emesis.  [facility] ADMISSION She was treated with NG tube decompression and paracentesis of 6L of fluid. Eventually discharged home on 10/18/22. She had been doing well initially at home.      CT abdomen pelvis with contrast showed hepatic cirrhosis with ascites and portal venous hypertension, possible partially obstructed proximal jejunal and duodenal small bowel.  ED provider reviewed the case with Dr. Aleen Campi with general surgery who recommended the patient be n.p.o. but did not feel she was a surgical candidate at this time.  Surgical team agreeable with lactulose per tube.    ER course In the ED, initial vitals were all normal but then subsequently desatted to 92% and was tachypneic.  No medications given in the ED.  Her workup in the ED showed normal Cr of 0.94, but elevated total bilirubin of 1.8 and significantly elevated ammonia level of 116.    Lab review showed:  CBC no  leukocytosis, no anemia, no thrombocytopenia/thrombocytosis; CMP sodium 130, potassium 3.6, glucose 128, creatinine 0.94, BUN 15, albumin 3.3, T. bili 1.8, alk phos 81, AST 30, ALT 16, anion gap 9.  Ammonia 116.  UDS positive for opiates.  Alcohol negative.  Urinalysis not concerning for acute cystitis.  Troponin 6, 5.       Significant Hospital Events: Including procedures, antibiotic start and stop dates in addition to other pertinent events   6/27 admitted for acute hepatic encepahlopathy    Antimicrobials:   Antibiotics Given (last 72 hours)     None       Objective   Blood pressure 94/73, pulse 73, temperature 97.9 F (36.6 C), temperature source Oral, resp. rate (!) 22, height 5\' 3"  (1.6 m), weight 55.3 kg, last menstrual period 11/03/2009, SpO2 99 %.       No intake or output data in the 24 hours ending 10/24/22 1750 Filed Weights   10/24/22 0949 10/24/22 1730  Weight: 79.4 kg 55.3 kg     REVIEW OF SYSTEMS  LIMITED ROS DUE TO ENCEPHALOPATHY  PHYSICAL EXAMINATION:  GENERAL:critically ill appearing, EYES: Pupils equal, round, reactive to light.  scleral icterus.  MOUTH: Moist mucosal membrane. NECK: Supple.  PULMONARY: Lungs clear to auscultation,   CARDIOVASCULAR: S1 and S2.  Regular rate and rhythm GASTROINTESTINAL: +tender, +distended. NEG bowel sounds.  MUSCULOSKELETAL: No swelling, clubbing, or edema.  NEUROLOGIC: lethargic but arousable SKIN:normal, warm to touch, Capillary refill delayed  Pulses present bilaterally   Labs/imaging that  I havepersonally reviewed  (right click and "Reselect all SmartList Selections" daily)     ASSESSMENT AND PLAN SYNOPSIS  Acute Hepatic metabolic encephalopathy due to underlying liver cirrhosis with SBO  Follow up GEN surgery recs Monitor resp status Minimal oxygen Follow up GI recs  VS stable no need for pressors     CARDIAC ICU monitoring   RENAL -continue Foley Catheter-assess need -Avoid  nephrotoxic agents -Follow urine output, BMP -Ensure adequate renal perfusion, optimize oxygenation -Renal dose medications  No intake or output data in the 24 hours ending 10/24/22 1750   NEUROLOGY Acute  metabolic encephalopathy Needs lactulose   ENDO - ICU hypoglycemic\Hyperglycemia protocol -check FSBS per protocol   GI GI PROPHYLAXIS as indicated  NUTRITIONAL STATUS DIET-->NPO Constipation protocol as indicated   ELECTROLYTES -follow labs as needed -replace as needed -pharmacy consultation and following   ACUTE ANEMIA- TRANSFUSE AS NEEDED CONSIDER TRANSFUSION  IF HGB<7 DVT PRX with TED/SCD's ONLY     Best practice (right click and "Reselect all SmartList Selections" daily)  Diet: NPO Mobility:  bed rest  Code Status:  FULL Disposition:ICU  Labs   CBC: Recent Labs  Lab 10/24/22 1013  WBC 7.5  NEUTROABS 5.5  HGB 14.0  HCT 44.1  MCV 89.8  PLT 172    Basic Metabolic Panel: Recent Labs  Lab 10/24/22 1013  NA 130*  K 3.6  CL 94*  CO2 27  GLUCOSE 128*  BUN 15  CREATININE 0.94  CALCIUM 9.0   GFR: Estimated Creatinine Clearance: 54.6 mL/min (by C-G formula based on SCr of 0.94 mg/dL). Recent Labs  Lab 10/24/22 1013  WBC 7.5    Liver Function Tests: Recent Labs  Lab 10/24/22 1013  AST 30  ALT 16  ALKPHOS 81  BILITOT 1.8*  PROT 8.0  ALBUMIN 3.3*   No results for input(s): "LIPASE", "AMYLASE" in the last 168 hours. Recent Labs  Lab 10/24/22 1013  AMMONIA 116*    ABG    Component Value Date/Time   PHART 7.35 01/25/2020 0500   PCO2ART 53 (H) 01/25/2020 0500   PO2ART 71 (L) 01/25/2020 0500   HCO3 29.3 (H) 01/25/2020 0500   ACIDBASEDEF 0.8 01/23/2020 1930   O2SAT 96.4 01/25/2020 0500     Coagulation Profile: No results for input(s): "INR", "PROTIME" in the last 168 hours.  Cardiac Enzymes: No results for input(s): "CKTOTAL", "CKMB", "CKMBINDEX", "TROPONINI" in the last 168 hours.  HbA1C: Hemoglobin A1C   Date/Time Value Ref Range Status  04/09/2014 05:21 AM 7.1 (H) 4.2 - 6.3 % Final    Comment:    The American Diabetes Association recommends that a primary goal of therapy should be <7% and that physicians should reevaluate the treatment regimen in patients with HbA1c values consistently >8%.    Hgb A1c MFr Bld  Date/Time Value Ref Range Status  01/22/2020 02:03 AM 6.9 (H) 4.8 - 5.6 % Final    Comment:    (NOTE) Pre diabetes:          5.7%-6.4%  Diabetes:              >6.4%  Glycemic control for   <7.0% adults with diabetes   11/04/2019 01:40 PM 6.0 (H) 4.8 - 5.6 % Final    Comment:    (NOTE) Pre diabetes:          5.7%-6.4%  Diabetes:              >6.4%  Glycemic control for   <7.0% adults  with diabetes     CBG: Recent Labs  Lab 10/24/22 1730  GLUCAP 79     Past Medical History:  She,  has a past medical history of Asthma, CHF (congestive heart failure) (HCC), COPD (chronic obstructive pulmonary disease) (HCC), Diabetes mellitus without complication (HCC), Diastolic dysfunction, Esophageal varices (HCC), Essential hypertension, GI bleed, HCV (hepatitis C virus), Hematemesis, Hepatic cirrhosis (HCC), Hypertension, Interstitial lung disease (HCC), Mixed hyperlipidemia, Morbid obesity (HCC), O2 dependent, PTSD (post-traumatic stress disorder), Sleep apnea, and Tobacco abuse.   Surgical History:   Past Surgical History:  Procedure Laterality Date   CHOLECYSTECTOMY     ESOPHAGOGASTRODUODENOSCOPY (EGD) WITH PROPOFOL N/A 11/04/2019   Procedure: ESOPHAGOGASTRODUODENOSCOPY (EGD) WITH PROPOFOL;  Surgeon: [name], MD;  Location: [facility] ENDOSCOPY;  Service: Endoscopy;  Laterality: N/A;   ORIF TIBIA PLATEAU Left 07/02/2015   Procedure: OPEN REDUCTION INTERNAL FIXATION (ORIF) TIBIAL PLATEAU;  Surgeon: Juanell Fairly, MD;  Location: [facility] ORS;  Service: Orthopedics;  Laterality: Left;     Social History:   reports that she has been smoking cigarettes. She has a 9.00 pack-year  smoking history. She has never used smokeless tobacco. She reports current alcohol use. She reports that she does not currently use drugs.   Family History:  Her family history includes Heart failure in her mother.   Allergies Allergies  Allergen Reactions   Other Other (See Comments)    Gel capsules causes GI problems   Citrated Iron     GI upset   Gelatin    Mycophenolate Sodium Other (See Comments)    transaminitis   Azathioprine Nausea Only   Mycophenolate Mofetil Nausea Only   Tacrolimus Diarrhea and Nausea Only     Home Medications  Prior to Admission medications   Medication Sig Start Date End Date Taking? Authorizing Provider  albuterol (PROVENTIL HFA;VENTOLIN HFA) 108 (90 Base) MCG/ACT inhaler Inhale 1-2 puffs into the lungs every 6 (six) hours as needed for wheezing or shortness of breath. 03/10/13  Yes [provider]  albuterol (PROVENTIL) (2.5 MG/3ML) 0.083% nebulizer solution Take 2.5 mg by nebulization every 4 (four) hours as needed. 01/03/20  Yes [provider]  atorvastatin (LIPITOR) 10 MG tablet Take 10 mg by mouth daily.  04/24/18  Yes [provider]  fluticasone (FLONASE) 50 MCG/ACT nasal spray Place 1 spray into both nostrils daily.   Yes [provider]  furosemide (LASIX) 20 MG tablet Take 40 mg by mouth daily. 06/28/22  Yes [provider]  hydrOXYzine (ATARAX) 10 MG tablet Take 1 tablet by mouth 3 (three) times daily as needed.   Yes [provider]  montelukast (SINGULAIR) 10 MG tablet Take 10 mg by mouth at bedtime.  11/19/19  Yes [provider]  naloxone Jonelle Sports) 4 MG/0.1ML LIQD nasal spray kit  08/18/18  Yes [provider]  pantoprazole (PROTONIX) 40 MG tablet Take 1 tablet (40 mg total) by mouth 2 (two) times daily. Patient taking differently: Take 40 mg by mouth daily. 11/08/19  Yes Jae Dire, MD  potassium chloride SA (KLOR-CON M) 20 MEQ tablet Take 2 tablets (40 mEq total) by  mouth daily. On Friday and Sunday only 11/15/21  Yes Hackney, Tina A, FNP  pregabalin (LYRICA) 25 MG capsule Take 25 mg by mouth 3 (three) times daily. 06/28/22 06/28/23 Yes [provider]  spironolactone (ALDACTONE) 25 MG tablet Take 50 mg by mouth daily. 06/28/22  Yes [provider]  venlafaxine XR (EFFEXOR-XR) 75 MG 24 hr capsule Take 150  mg by mouth daily. 09/23/22  Yes [provider]  budesonide-formoterol (SYMBICORT) 160-4.5 MCG/ACT inhaler Inhale 2 puffs into the lungs daily. Patient not taking: Reported on 01/31/2022 07/02/19   [provider]  carvedilol (COREG) 3.125 MG tablet Take 3.125 mg by mouth daily. Patient not taking: Reported on 10/24/2022    [provider]  clonazePAM (KLONOPIN) 0.5 MG tablet Take 0.5 mg by mouth 2 (two) times daily. Patient not taking: Reported on 10/24/2022 01/17/22   [provider]  escitalopram (LEXAPRO) 20 MG tablet Take 20 mg by mouth every morning. Patient not taking: Reported on 10/24/2022 01/17/22   [provider]  fluticasone furoate-vilanterol (BREO ELLIPTA) 200-25 MCG/INH AEPB Inhale 1 puff into the lungs daily. Patient not taking: Reported on 12/12/2021    [provider]  Oxycodone HCl 10 MG TABS Take 10 mg by mouth 4 (four) times daily. Patient not taking: Reported on 10/24/2022    [provider]  pregabalin (LYRICA) 75 MG capsule Take 75 mg by mouth 3 (three) times daily. Patient not taking: Reported on 10/24/2022    [provider]  SPIRIVA RESPIMAT 2.5 MCG/ACT AERS Inhale 2 puffs into the lungs daily as needed. Patient not taking: Reported on 01/31/2022 07/22/19   [provider]  spironolactone (ALDACTONE) 100 MG tablet Take 1 tablet (100 mg total) by mouth daily. MUST MAKE APPT FOR FURTHER REFILLS Patient not taking: Reported on 10/24/2022 02/28/21   Clarisa Kindred A, FNP  TRELEGY ELLIPTA 100-62.5-25 MCG/ACT AEPB Inhale 1 puff into the lungs daily. Patient  not taking: Reported on 10/24/2022 04/13/21   [provider]  triamcinolone ointment (KENALOG) 0.5 % Apply 1 application topically 2 (two) times daily. Patient not taking: Reported on 10/24/2022 08/23/19   [provider]  umeclidinium bromide (INCRUSE ELLIPTA) 62.5 MCG/INH AEPB Inhale 1 puff into the lungs daily. Patient not taking: Reported on 12/12/2021    [provider]  venlafaxine (EFFEXOR) 37.5 MG tablet Take 75 mg by mouth daily. Patient not taking: Reported on 10/24/2022    [provider]       CASE DISCUSSED IN MULTIDISCIPLINARY ROUNDS WITH ICU TEAM   Critical Care Time devoted to patient care services described in this note is 65 minutes.    Lucie Leather, M.D.  [facility]  Medical Director [facility] Medical Director [facility] Cardio-Pulmonary Department

## 2022-10-24 NOTE — ED Notes (Signed)
Katha Cabal DO made aware of pressure 86/66 (74). Relayed to RN to let her know if MAP falls below 65.

## 2022-10-24 NOTE — H&P (Addendum)
History and Physical    Patient: [name] QMV:[id number] DOB: 12-01-1965 DOA: 10/24/2022 DOS: the patient was seen and examined on 10/24/2022 [facility]  Patient coming from: Home   Chief Complaint:  Chief Complaint  Patient presents with   Altered Mental Status   History provided by chart, patient, ED provider and pt's daughter (telephone) HPI: [name] is a 57 y.o. female with medical history significant of hepatitis C with subsequent cirrhosis, diastolic heart failure,  COPD, ILD (2 L oxygen), HTN, GERD, chronic back pain who presented to ED for altered mental status.   Patient's daughter [name] states that her dad called her saying that her mom seemed out of it.  She was just hospitalized for a small bowel obstruction and was vomiting green stuff.  Patient admitted to [facility] for SBO and volume overload. Since getting home from the hospital she has been becoming short of breath.  Patient's husband reported that she had been confused and was not responding as she would normally. She vomited last on Wed but told daughter yesterday feeling better but still nauseated.  She was concerned that she had pneumonia again. No known fevers, chills or diarrhea. No known sick contacts. Last paracentesis was during her admission at [facility]   She smokes cigarettes but stopped drinking alcohol a few months ago. Daughter unsure when the last EtOH drink was.  In the ED, initial vitals were all normal but then subsequently desatted to 92% and was tachypneic.  No medications given in the ED.  Lab review showed: CBC no leukocytosis, no anemia, no thrombocytopenia/thrombocytosis; CMP sodium 130, potassium 3.6, glucose 128, creatinine 0.94, BUN 15, albumin 3.3, T. bili 1.8, alk phos 81, AST 30, ALT 16, anion gap 9.  Ammonia 116.  UDS positive for opiates.  Alcohol negative.  Urinalysis not concerning for acute cystitis.  Troponin 6, 5.  Chest x-ray showed stable interstitial  changes but was somewhat under deflated.  CT head was unremarkable.  CT abdomen pelvis with contrast showed hepatic cirrhosis with ascites and portal venous hypertension, possible partially obstructed proximal jejunal and duodenal small bowel.  ED provider reviewed the case with Dr. Aleen Campi with general surgery who recommended the patient be n.p.o. but did not feel she was a surgical candidate at this time.  Surgical team agreeable with lactulose per tube. ED physician consulted hospital team for admission.    Review of Systems: unable to review all systems due to the inability of the patient to answer questions. Past Medical History:  Diagnosis Date   Asthma    CHF (congestive heart failure) (HCC)    COPD (chronic obstructive pulmonary disease) (HCC)    Diabetes mellitus without complication (HCC)    Diastolic dysfunction    a. 03/2018 [facility]): EF >55%, Gr1 DD. Nl RV fxn.   Esophageal varices (HCC)    a. 10/2019 EGD: gr III esoph varices s/p banding.   Essential hypertension    GI bleed    HCV (hepatitis C virus)    Hematemesis    a. 10/2019 EGD: Gr III esoph varices, incompletely eradicated, banded. Non-bleeding ulcers tx w/ argon plasma coag. Single non-bleeding angiodysplastic lesion in duodenum tx w/ APC.   Hepatic cirrhosis (HCC)    a. h/o ETOH/Hep C.   Hypertension    Interstitial lung disease (HCC)    a. Followed @ [facility] pulm. Prev on cellcept (caused GI side effects), Imuran (GI se), myfortic (elev LFTs). More recently on rituximab.   Mixed  hyperlipidemia    Morbid obesity (HCC)    O2 dependent    PTSD (post-traumatic stress disorder)    Sleep apnea    Tobacco abuse    Past Surgical History:  Procedure Laterality Date   CHOLECYSTECTOMY     ESOPHAGOGASTRODUODENOSCOPY (EGD) WITH PROPOFOL N/A 11/04/2019   Procedure: ESOPHAGOGASTRODUODENOSCOPY (EGD) WITH PROPOFOL;  Surgeon: [name], MD;  Location: [facility] ENDOSCOPY;  Service: Endoscopy;  Laterality: N/A;   ORIF TIBIA PLATEAU  Left 07/02/2015   Procedure: OPEN REDUCTION INTERNAL FIXATION (ORIF) TIBIAL PLATEAU;  Surgeon: Juanell Fairly, MD;  Location: [facility] ORS;  Service: Orthopedics;  Laterality: Left;   Social History:  reports that she has been smoking cigarettes. She has a 9.00 pack-year smoking history. She has never used smokeless tobacco. She reports current alcohol use. She reports that she does not currently use drugs.  Allergies  Allergen Reactions   Other Other (See Comments)    Gel capsules causes GI problems   Citrated Iron     GI upset   Gelatin    Mycophenolate Sodium Other (See Comments)    transaminitis   Azathioprine Nausea Only   Mycophenolate Mofetil Nausea Only   Tacrolimus Diarrhea and Nausea Only    Family History  Problem Relation Age of Onset   Heart failure Mother     Prior to Admission medications   Medication Sig Start Date End Date Taking? Authorizing Provider  albuterol (PROVENTIL HFA;VENTOLIN HFA) 108 (90 Base) MCG/ACT inhaler Inhale 1-2 puffs into the lungs every 6 (six) hours as needed for wheezing or shortness of breath. 03/10/13   [provider]  albuterol (PROVENTIL) (2.5 MG/3ML) 0.083% nebulizer solution Take 2.5 mg by nebulization every 4 (four) hours as needed. 01/03/20   [provider]  atorvastatin (LIPITOR) 10 MG tablet Take 10 mg by mouth daily.  04/24/18   [provider]  budesonide-formoterol (SYMBICORT) 160-4.5 MCG/ACT inhaler Inhale 2 puffs into the lungs daily. Patient not taking: Reported on 01/31/2022 07/02/19   [provider]  carvedilol (COREG) 3.125 MG tablet Take 3.125 mg by mouth daily.    [provider]  clonazePAM (KLONOPIN) 0.5 MG tablet Take 0.5 mg by mouth 2 (two) times daily. 01/17/22   [provider]  escitalopram (LEXAPRO) 20 MG tablet Take 20 mg by mouth every morning. 01/17/22   [provider]  fluticasone (FLONASE) 50 MCG/ACT nasal spray Place 1 spray into both nostrils daily.     [provider]  fluticasone furoate-vilanterol (BREO ELLIPTA) 200-25 MCG/INH AEPB Inhale 1 puff into the lungs daily. Patient not taking: Reported on 12/12/2021    [provider]  hydrOXYzine (ATARAX) 10 MG tablet Take 1 tablet by mouth 3 (three) times daily as needed.    [provider]  montelukast (SINGULAIR) 10 MG tablet Take 10 mg by mouth at bedtime.  11/19/19   [provider]  naloxone Jonelle Sports) 4 MG/0.1ML LIQD nasal spray kit  08/18/18   [provider]  Oxycodone HCl 10 MG TABS Take 10 mg by mouth 4 (four) times daily.    [provider]  pantoprazole (PROTONIX) 40 MG tablet Take 1 tablet (40 mg total) by mouth 2 (two) times daily. Patient taking differently: Take 40 mg by mouth daily. 11/08/19   Jae Dire, MD  potassium chloride SA (KLOR-CON M) 20 MEQ tablet Take 2 tablets (40 mEq total) by mouth daily. On Friday and Sunday only 11/15/21   Delma Freeze, FNP  pregabalin (LYRICA) 75  MG capsule Take 75 mg by mouth 3 (three) times daily.    [provider]  SPIRIVA RESPIMAT 2.5 MCG/ACT AERS Inhale 2 puffs into the lungs daily as needed. Patient not taking: Reported on 01/31/2022 07/22/19   [provider]  spironolactone (ALDACTONE) 100 MG tablet Take 1 tablet (100 mg total) by mouth daily. MUST MAKE APPT FOR FURTHER REFILLS Patient taking differently: Take 50 mg by mouth daily. 02/28/21   Clarisa Kindred A, FNP  TRELEGY ELLIPTA 100-62.5-25 MCG/ACT AEPB Inhale 1 puff into the lungs daily. 04/13/21   [provider]  triamcinolone ointment (KENALOG) 0.5 % Apply 1 application topically 2 (two) times daily. 08/23/19   [provider]  umeclidinium bromide (INCRUSE ELLIPTA) 62.5 MCG/INH AEPB Inhale 1 puff into the lungs daily. Patient not taking: Reported on 12/12/2021    [provider]  venlafaxine (EFFEXOR) 37.5 MG tablet Take 75 mg by mouth daily.    [provider]    Physical  Exam: Vitals:   10/24/22 1430 10/24/22 1522 10/24/22 1525 10/24/22 1631  BP: 92/68 (!) 86/66  115/70  Pulse: 86 85  89  Resp: (!) 25 (!) 28  19  Temp:  97.6 F (36.4 C)  97.7 F (36.5 C)  TempSrc:  Axillary  Axillary  SpO2: 92% 90% 95% 99%  Weight:      Height:       GEN:    drowsy but easily arouses to voice and in no distress    HENT:  mucus membranes moist, oropharyngeal without lesions or erythema,  nares patent, no nasal discharge  EYES:   pupils equal and reactive, EOM intact, no scleral icterus NECK:  supple, good ROM RESP:  clear to auscultation bilaterally, no increased work of breathing  CVS:   regular rate and rhythm, no murmur, distal pulses intact, no appreciable JVP ABD:  soft, distended, fluid wave present, non-tender; bowel sounds present EXT:   normal ROM, atraumatic, no LE edema  NEURO:  drowsy, knows name, location and recalls birth date, lifts extremities, strength 5/5 UE and LE, no asterixis appreciated  Skin:   warm and dry, well healed scars on her knee, no jaundice   Data Reviewed:  Relevant notes from primary care and specialist visits, past discharge summaries as available in EHR, including Care Everywhere. Prior diagnostic testing as pertinent to current admission diagnoses Updated medications and problem lists for reconciliation ED course, including vitals, labs, imaging, treatment and response to treatment Triage notes, nursing and pharmacy notes and ED provider's notes Notable results as noted in HPI  Assessment and Plan: Principal Problem:   Acute metabolic encephalopathy Active Problems:   COPD (chronic obstructive pulmonary disease) (HCC)   Dyslipidemia   Essential hypertension   ILD (interstitial lung disease) (HCC)   Decompensated HCV cirrhosis (HCC)   Congestive heart failure (HCC)   Obstructive sleep apnea   Small bowel obstruction (HCC)   Chronic back pain   GERD (gastroesophageal reflux disease)   Suspected partial small bowel  obstruction  CT ABD/Pelvis with contrast concerning for partial obstruction of the more proximal jejunal and duodenal small bowel.  - Surgery team aware Dr Aleen Campi to see, appreciate consult   - Lactulose per tube ordered otherwise NPO   Decompensated cirrhosis 2/2 Hep C HCV treated with Harvoni. Has esophageal varices that were banded and eradicated in May 2023. Follows with [facility]. Home medications include Bumex 3 mg and Spironolactone 50 mg per discharge paperwork from [facility]. Med-rec reports Lasix  40 mg daily. Had paracentesis (5L) on 10/11/22 that were not concerning for SBP.  - Consulted GI, appreciate the assistance of Dr Servando Snare - Pt likely will need IR paracentesis this  - Holding diuretics while NPO with soft blood pressures    Interstitial lung disease  COPD Reported Trelegy use at last admission  - Albuterol Nebs PRN - Continue home 2 L oxygen   Chronic diastolic heart failure  Hold Bumex 3 mg daily with potassium, Spironolactone 50 mg daily, Lipitor 10 mg while NPO  Hypertension Pt with soft blood pressures on admission. Holding all antihypertensives.  [facility] Says she takes oxycodone but per pharmacy patient has not taken this medication. UDS positive for opioids. Lyrica 25 mg TID.  - Hold home meds while NPO  Anxiety  - Hold home Effexor and Atarax 10 mg TID PRN   GERD - Hold pantoprazole 40 mg   OSA - CPAP at bedtime     Advance Care Planning:   Code Status: Full Code   Consults: general surgery, gastroenterology   Family Communication: Daughter Destiny   Severity of Illness: The appropriate patient status for this patient is INPATIENT. Inpatient status is judged to be reasonable and necessary in order to provide the required intensity of service to ensure the patient's safety. The patient's presenting symptoms, physical exam findings, and initial radiographic and laboratory data in the context of their chronic comorbidities is felt to place them at  high risk for further clinical deterioration. Furthermore, it is not anticipated that the patient will be medically stable for discharge from the hospital within 2 midnights of admission.   * I certify that at the point of admission it is my clinical judgment that the patient will require inpatient hospital care spanning beyond 2 midnights from the point of admission due to high intensity of service, high risk for further deterioration and high frequency of surveillance required.*  Author: Katha Cabal, DO 10/24/2022 5:01 PM  For on call review [email or web address].

## 2022-10-24 NOTE — Progress Notes (Signed)
Attempted to place NGT at this time. Pt is saying she does not want it but she will let me attempt once. During the attempt, pt was very agitated and I was unable to advance tube. Surgery and hospitalist made aware of this. Will try another attempt when pt calms down some.

## 2022-10-24 NOTE — ED Notes (Signed)
Katha Cabal, DO secure chatted about patients gradually declining blood pressure. Latest pressure 83/63 b/p

## 2022-10-25 ENCOUNTER — Other Ambulatory Visit: Payer: Self-pay

## 2022-10-25 ENCOUNTER — Inpatient Hospital Stay: Payer: Medicaid Other

## 2022-10-25 DIAGNOSIS — G4733 Obstructive sleep apnea (adult) (pediatric): Secondary | ICD-10-CM | POA: Diagnosis not present

## 2022-10-25 DIAGNOSIS — K7682 Hepatic encephalopathy: Secondary | ICD-10-CM | POA: Diagnosis not present

## 2022-10-25 DIAGNOSIS — J849 Interstitial pulmonary disease, unspecified: Secondary | ICD-10-CM

## 2022-10-25 DIAGNOSIS — E876 Hypokalemia: Secondary | ICD-10-CM

## 2022-10-25 DIAGNOSIS — G9341 Metabolic encephalopathy: Secondary | ICD-10-CM

## 2022-10-25 DIAGNOSIS — B1920 Unspecified viral hepatitis C without hepatic coma: Secondary | ICD-10-CM

## 2022-10-25 DIAGNOSIS — K7469 Other cirrhosis of liver: Secondary | ICD-10-CM

## 2022-10-25 DIAGNOSIS — K56609 Unspecified intestinal obstruction, unspecified as to partial versus complete obstruction: Secondary | ICD-10-CM | POA: Diagnosis not present

## 2022-10-25 DIAGNOSIS — I10 Essential (primary) hypertension: Secondary | ICD-10-CM

## 2022-10-25 DIAGNOSIS — I5032 Chronic diastolic (congestive) heart failure: Secondary | ICD-10-CM

## 2022-10-25 LAB — COMPREHENSIVE METABOLIC PANEL
ALT: 13 U/L (ref 0–44)
AST: 23 U/L (ref 15–41)
Albumin: 2.7 g/dL — ABNORMAL LOW (ref 3.5–5.0)
Alkaline Phosphatase: 64 U/L (ref 38–126)
Anion gap: 8 (ref 5–15)
BUN: 12 mg/dL (ref 6–20)
CO2: 28 mmol/L (ref 22–32)
Calcium: 8.7 mg/dL — ABNORMAL LOW (ref 8.9–10.3)
Chloride: 102 mmol/L (ref 98–111)
Creatinine, Ser: 0.63 mg/dL (ref 0.44–1.00)
GFR, Estimated: >60 mL/min (ref >60)
Glucose, Bld: 81 mg/dL (ref 70–99)
Potassium: 3.2 mmol/L — ABNORMAL LOW (ref 3.5–5.1)
Sodium: 135 mmol/L (ref 135–145)
Total Bilirubin: 1.2 mg/dL (ref 0.3–1.2)
Total Protein: 6.6 g/dL (ref 6.5–8.1)

## 2022-10-25 LAB — GLUCOSE, CAPILLARY
Glucose-Capillary: 73 mg/dL (ref 70–99)
Glucose-Capillary: 59 mg/dL — ABNORMAL LOW (ref 70–99)
Glucose-Capillary: 73 mg/dL (ref 70–99)
Glucose-Capillary: 83 mg/dL (ref 70–99)
Glucose-Capillary: 101 mg/dL — ABNORMAL HIGH (ref 70–99)
Glucose-Capillary: 82 mg/dL (ref 70–99)
Glucose-Capillary: 98 mg/dL (ref 70–99)

## 2022-10-25 LAB — CBC
HCT: 41.1 % (ref 36.0–46.0)
Hemoglobin: 13.1 g/dL (ref 12.0–15.0)
MCH: 28.6 pg (ref 26.0–34.0)
MCHC: 31.9 g/dL (ref 30.0–36.0)
MCV: 89.7 fL (ref 80.0–100.0)
Platelets: 155 10*3/uL (ref 150–400)
RBC: 4.58 MIL/uL (ref 3.87–5.11)
RDW: 15.9 % — ABNORMAL HIGH (ref 11.5–15.5)
WBC: 4.9 10*3/uL (ref 4.0–10.5)
nRBC: 0 % (ref 0.0–0.2)

## 2022-10-25 LAB — PROTIME-INR
INR: 1.4 — ABNORMAL HIGH (ref 0.8–1.2)
Prothrombin Time: 17.4 seconds — ABNORMAL HIGH (ref 11.4–15.2)

## 2022-10-25 LAB — MAGNESIUM: Magnesium: 1.9 mg/dL (ref 1.7–2.4)

## 2022-10-25 LAB — PHOSPHORUS: Phosphorus: 4 mg/dL (ref 2.5–4.6)

## 2022-10-25 LAB — HIV ANTIBODY (ROUTINE TESTING W REFLEX): HIV Screen 4th Generation wRfx: NONREACTIVE

## 2022-10-25 MED ORDER — POTASSIUM CHLORIDE CRYS ER 20 MEQ PO TBCR
40.0000 meq | EXTENDED_RELEASE_TABLET | Freq: Once | ORAL | Status: AC
  Administered 2022-10-25: 40 meq via ORAL
  Filled 2022-10-25: qty 2

## 2022-10-25 MED ORDER — POTASSIUM CHLORIDE 20 MEQ PO PACK
40.0000 meq | PACK | Freq: Every day | ORAL | Status: DC
  Administered 2022-10-25 – 2022-10-26 (×2): 40 meq via ORAL
  Filled 2022-10-25 (×2): qty 2

## 2022-10-25 MED ORDER — FUROSEMIDE 20 MG PO TABS
40.0000 mg | ORAL_TABLET | Freq: Every day | ORAL | Status: DC
  Administered 2022-10-26 – 2022-10-27 (×2): 40 mg via ORAL
  Filled 2022-10-25 (×2): qty 2

## 2022-10-25 MED ORDER — POTASSIUM CHLORIDE 10 MEQ/100ML IV SOLN
10.0000 meq | INTRAVENOUS | Status: DC
  Administered 2022-10-25 (×2): 10 meq via INTRAVENOUS
  Filled 2022-10-25 (×4): qty 100

## 2022-10-25 MED ORDER — DEXTROSE 50 % IV SOLN
12.5000 g | Freq: Once | INTRAVENOUS | Status: AC
  Administered 2022-10-25: 12.5 g via INTRAVENOUS
  Filled 2022-10-25: qty 50

## 2022-10-25 MED ORDER — NICOTINE POLACRILEX 2 MG MT GUM
2.0000 mg | CHEWING_GUM | OROMUCOSAL | Status: DC | PRN
  Administered 2022-10-25 – 2022-10-27 (×4): 2 mg via ORAL
  Filled 2022-10-25 (×6): qty 1

## 2022-10-25 MED ORDER — MIDODRINE HCL 5 MG PO TABS
5.0000 mg | ORAL_TABLET | Freq: Three times a day (TID) | ORAL | Status: DC
  Administered 2022-10-25 – 2022-10-27 (×7): 5 mg via ORAL
  Filled 2022-10-25 (×7): qty 1

## 2022-10-25 MED ORDER — MORPHINE SULFATE (PF) 2 MG/ML IV SOLN
2.0000 mg | INTRAVENOUS | Status: DC | PRN
  Administered 2022-10-25 – 2022-10-27 (×5): 2 mg via INTRAVENOUS
  Filled 2022-10-25 (×5): qty 1

## 2022-10-25 NOTE — Progress Notes (Signed)
  Progress Note   Patient: [name] [id number]:[id number] DOB: [birth year] DOA: 10/24/2022     1 DOS: the patient was seen and examined on 10/25/2022   Brief hospital course: [name] is a 57 y.o. female with medical history significant of hepatitis C with subsequent cirrhosis, diastolic heart failure,  COPD, ILD (2 L oxygen), HTN, GERD, chronic back pain who presented to ED for altered mental status. Patient admitted to [facility] for SBO and volume overload. Since getting home from the hospital she has been becoming short of breath, confused and was not responding as she would normally. She has been nauseated. Last paracentesis was during her admission at [facility]. She smokes cigarettes but stopped drinking alcohol a few months ago. Daughter unsure when the last EtOH drink was.  Patient is admitted for further management evaluation of hepatic encephalopathy, partial small bowel obstruction, decompensated cirrhosis secondary to hep C.  Assessment and Plan:  Acute metabolic encephalopathy Possibly hepatic encephalopathy in the setting of decompensated liver cirrhosis- Ammonia level trending down. Continue Lactulose. Lasix, spironolactone on hold GI consult pending. She is more alert and awake, able to answer me. Respiratory status stable.  Will transfer her to progressive care unit from ICU.  Suspected partial small bowel obstruction- General surgery evaluation appreciated. Patient is able to pass gas. Continue lactulose, she is able to tolerate orally. Clear liquid diet and advance as tolerated.  Interstitial lung disease COPD- Continue albuterol nebs. Continue 2 L home oxygen.  Chronic diastolic heart failure- Will restart her home medications spironolactone, lasix once BP improved.  Hypotension- Hold oral home antihypertensive medications.  Chronic back pain- She is on opiate medications. Advised to limit use of opiates given her low blood pressures.  Anxiety continue  Effexor, Atarax.  GERD continue pantoprazole therapy.  OSA on CPAP at bedtime.  DVT prophylaxis-SCD. CODE STATUS full code.      Subjective: Patient is seen and examined today morning.  She is more alert, awake, able to answer me.  Has lower abdominal discomfort, asks for food.  Physical Exam: Vitals:   10/25/22 0500 10/25/22 0600 10/25/22 0630 10/25/22 0701  BP: 116/68 101/66 101/66   Pulse: 80     Resp: (!) 27     Temp:    97.8 F (36.6 C)  TempSrc:    Oral  SpO2: 100%     Weight:      Height:       General -middle-aged ill-looking African-American female, no apparent distress HEENT - PERRLA, EOMI, atraumatic head, non tender sinuses. Lung - Clear, diffuse rhonchi. Heart - S1, S2 heard, no murmurs, rubs, trace pedal edema. Abdomen distended, fluid thrill positive, lower abdominal tenderness Neuro - Alert, awake and oriented x 3, non focal exam. Skin - Warm and dry. Data Reviewed:  Blood sugars, CBC, CMP, PT/INR, HIV  Family Communication: Patient's daughter please call our phone and I updated her regarding the current plan.  Disposition: Status is: Inpatient Remains inpatient appropriate because: Suspected partial bowel obstruction, hepatic encephalopathy  Planned Discharge Destination: Home with Home Health    MDM level 3-patient is admitted for hepatic encephalopathy, suspected small bowel obstruction, electrolyte derangements and is at high risk for clinical deterioration.  Author: Marcelino Duster, MD 10/25/2022 11:19 AM  For on call review [email or web address].

## 2022-10-25 NOTE — Progress Notes (Signed)
[facility] SURGICAL PROGRESS NOTE (cpt [phone])  Hospital Day(s): 1.   Interval History: Patient seen and examined, overnight refusing all intervention including NGT placement and paracentesis. This morning she denied any significant complaints. Still somewhat confused about aspects of her care and what happened previously at [facility]. She is refusing all interventions until daughter arrives. She does report passing flatus but difficult to understand how reliable she is.   Recent work up shows a normal WBC at 4.9K, Hgb to 13.1, sCr - 0.63, Hypokalemia to 3.2, Albumin 2.7, bilirubin level normal at 1.2, and INR 1.4. She did have KUB this AM concerning for continued dilation of small bowel proximally.    Review of Systems:  Constitutional: denies fever, chills  HEENT: denies cough or congestion  Respiratory: denies any shortness of breath  Cardiovascular: denies chest pain or palpitations  Gastrointestinal: denies abdominal pain, N/V Genitourinary: denies burning with urination or urinary frequency  Vital signs in last 24 hours: [min-max] current  Temp:  [97.4 F (36.3 C)-97.9 F (36.6 C)] 97.7 F (36.5 C) (06/28 0358) Pulse Rate:  [70-95] 80 (06/28 0500) Resp:  [18-29] 27 (06/28 0500) BP: (80-132)/(54-89) 101/66 (06/28 0630) SpO2:  [90 %-100 %] 100 % (06/28 0500) Weight:  [52.1 kg-79.4 kg] 52.1 kg (06/28 0358)     Height: 5\' 3"  (160 cm) Weight: 52.1 kg BMI (Calculated): 20.35   Intake/Output last 2 shifts:  06/27 0701 - 06/28 0700 In: 250 [IV Piggyback:250] Out: -    Physical Exam:  Constitutional: alert, cooperative and no distress  HENT: normocephalic without obvious abnormality  Eyes: PERRL, EOM's grossly intact and symmetric  Respiratory: breathing non-labored at rest, [place] in place but on patient's neck Cardiovascular: regular rate and sinus rhythm  Gastrointestinal: Soft, no overt tenderness, no rebound/guarding    Labs:     Latest Ref Rng & Units 10/25/2022     4:19 AM 10/24/2022   10:13 AM 01/26/2020    4:01 AM  CBC  WBC 4.0 - 10.5 K/uL 4.9  7.5  9.2   Hemoglobin 12.0 - 15.0 g/dL 19.1  47.8  29.5   Hematocrit 36.0 - 46.0 % 41.1  44.1  38.5   Platelets 150 - 400 K/uL 155  172  113       Latest Ref Rng & Units 10/25/2022    4:19 AM 10/24/2022   10:13 AM 12/11/2021   10:02 AM  CMP  Glucose 70 - 99 mg/dL 81  621  308   BUN 6 - 20 mg/dL 12  15  31    Creatinine 0.44 - 1.00 mg/dL 6.57  8.46  9.62   Sodium 135 - 145 mmol/L 135  130  137   Potassium 3.5 - 5.1 mmol/L 3.2  3.6  4.4   Chloride 98 - 111 mmol/L 102  94  104   CO2 22 - 32 mmol/L 28  27  27    Calcium 8.9 - 10.3 mg/dL 8.7  9.0  8.2   Total Protein 6.5 - 8.1 g/dL 6.6  8.0    Total Bilirubin 0.3 - 1.2 mg/dL 1.2  1.8    Alkaline Phos 38 - 126 U/L 64  81    AST 15 - 41 U/L 23  30    ALT 0 - 44 U/L 13  16       Imaging studies:   KUB (10/25/2022) personally reviewed showing loop of dilated bowel in upper abdomen, no free air, and radiologist report reviewed:  IMPRESSION: Single  persistently dilated loop of small bowel in the left upper quadrant, which may represent ileus. Otherwise, bowel gas pattern is nonobstructive.   Assessment/Plan: (ICD-10's: K69.609) 57 y.o. female with improving encephalopathy secondary to liver failure, also found with dilated loop of proximal small bowel concerning for possible pSBO   - She is currently refusing all interventions, including NGT at this time. She is non-toxic and not overtly peritonitic so no surgical intervention warranted anyway. Additionally, she is an extremely sub-optimal surgical candidate given her liver failure. She is Childs B at a minimum which carries 30% peri-operative mortality.  - If she continues to pass flatus, okay to gradually initiate and advance diet  - Monitor abdominal examination; on-going bowel function  - Further management per primary service    - She is refusing all intervention (ie: NGT, Paracentesis). Nothing  to offer from surgical perspective. We will remain available if situation changes.    All of the above findings and recommendations were discussed with the patient, and the medical team, and all of patient's questions were answered to her expressed satisfaction.  -- Lynden Oxford, PA-C [facility] 10/25/2022, 7:52 AM M-F: 7am - 4pm

## 2022-10-25 NOTE — Progress Notes (Signed)
   BRIEF PCCM NOTE  BRIEF PT DESCRIPTION / SYNOPSIS :  57 y.o. female admitted with Acute Hepatic Encephalopathy and ascites with partial Small Bowel Obstruction.  SUBJECTIVE / INTERVAL HISTORY :  -Pt is awake, alert and oriented -Hemodynamically stable, On 2L [place], SBP stable in the upper 90's to 110's ~ NOT REQUIRING PRESSORS -Pt was scheduled for Paracentesis today, however now is refusing -No current ICU needs ~ PCCM will sign off at this time, please re-consult should any ICU needs arise or if we can be of further assistance.  OBJECTIVE :   Vitals:   10/25/22 0600 10/25/22 0630  BP: 101/66 101/66  Pulse:    Resp:    Temp:    SpO2:         This a a non billable note.  Harlon Ditty, AGACNP-BC [facility] Prefer epic messenger for cross cover needs If after hours, please call E-link

## 2022-10-25 NOTE — Progress Notes (Signed)
Transition of Care [facility]) - Inpatient Brief Assessment   Patient Details  Name: MYLINH BILTON MRN: [id number] Date of Birth: [birth year]  Transition of Care [facility]) CM/SW Contact:    Darolyn Rua, LCSW Phone Number: 10/25/2022, 12:25 PM   Clinical Narrative:  Patient from home presenting to ED with acute hepatic encephalopathy and partial small bowel obstruction. Patient hemodynamically stable, On 2L [place] (baseline at home)  Patient has Medicaid [employer], goes to [facility] for PCP follow up.   Please consult toc should needs arise.   Transition of Care Asessment: Insurance and Status: Insurance coverage has been reviewed Patient has primary care physician: Yes Home environment has been reviewed: from home Prior level of function:: independent Prior/Current Home Services: No current home services Social Determinants of Health Reivew: SDOH reviewed no interventions necessary Readmission risk has been reviewed: Yes Transition of care needs: no transition of care needs at this time

## 2022-10-25 NOTE — Consult Note (Addendum)
[name], MD [facility]  [address], [address] [place], [place] [address] Phone: [phone] Fax : [phone]  Consultation  Referring Provider:     Dr. Seward Meth Primary Care Physician:  [facility] Primary Gastroenterologist:  [facility]         Reason for Consultation:     Cirrhosis  Date of Admission:  10/24/2022 Date of Consultation:  10/25/2022         HPI:   [name] is a 57 y.o. female who is followed by [facility] and was recently in the hospital.  Patient was in the hospital at [facility] for vomiting and overload with small bowel obstruction who was then admitted yesterday to our hospital with mental status changes.  The patient has had ongoing nausea and vomiting since discharge on the 21st of this month.  The patient was found to have hypotension and was sent to the ICU for further care.  She does report that she takes lactulose at home.  The patient is alert and orientated x 3 today when I came to see her but still has a low blood pressure with a systolic in the high 80s.  The patient reports that she came to our hospital and not [facility] where her GI doctors are it is because the ambulance would not take her to [facility].  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  The patient states she has not had any alcohol in the last 6 months.  The patient CT scan showed:  IMPRESSION: Hepatic cirrhosis with ascites and portal venous hypertension.   Moderate wall thickening is seen involving jejunal bowel loops most likely due to inflammation or edema, and this appears to result in at least partial obstruction of the more proximal jejunal and duodenal small bowel.   Aortic Atherosclerosis  The patient has been resistant to having a paracentesis and states she is not sure how that will help her.  Patient had a KUB today that showed a single persistent dilated loop of small bowel in the left upper quadrant that may reflect ileus.  Patient denies any  abdominal pain at the present time.  Past Medical History:  Diagnosis Date   Asthma    CHF (congestive heart failure) (HCC)    COPD (chronic obstructive pulmonary disease) (HCC)    Diabetes mellitus without complication (HCC)    Diastolic dysfunction    a. 03/2018 Echo [facility]): EF >55%, Gr1 DD. Nl RV fxn.   Esophageal varices (HCC)    a. 10/2019 EGD: gr III esoph varices s/p banding.   Essential hypertension    GI bleed    HCV (hepatitis C virus)    Hematemesis    a. 10/2019 EGD: Gr III esoph varices, incompletely eradicated, banded. Non-bleeding ulcers tx w/ argon plasma coag. Single non-bleeding angiodysplastic lesion in duodenum tx w/ APC.   Hepatic cirrhosis (HCC)    a. h/o ETOH/Hep C.   Hypertension    Interstitial lung disease (HCC)    a. Followed @ [facility] pulm. Prev on cellcept (caused GI side effects), Imuran (GI se), myfortic (elev LFTs). More recently on rituximab.   Mixed hyperlipidemia    Morbid obesity (HCC)    O2 dependent    PTSD (post-traumatic stress disorder)    Sleep apnea    Tobacco abuse     Past Surgical History:  Procedure Laterality Date   CHOLECYSTECTOMY     ESOPHAGOGASTRODUODENOSCOPY (EGD) WITH PROPOFOL  N/A 11/04/2019   Procedure: ESOPHAGOGASTRODUODENOSCOPY (EGD) WITH PROPOFOL;  Surgeon: [name], MD;  Location: [facility] ENDOSCOPY;  Service: Endoscopy;  Laterality: N/A;   ORIF TIBIA PLATEAU Left 07/02/2015   Procedure: OPEN REDUCTION INTERNAL FIXATION (ORIF) TIBIAL PLATEAU;  Surgeon: Juanell Fairly, MD;  Location: [facility] ORS;  Service: Orthopedics;  Laterality: Left;    Prior to Admission medications   Medication Sig Start Date End Date Taking? Authorizing Provider  albuterol (PROVENTIL HFA;VENTOLIN HFA) 108 (90 Base) MCG/ACT inhaler Inhale 1-2 puffs into the lungs every 6 (six) hours as needed for wheezing or shortness of breath. 03/10/13  Yes [provider]  albuterol (PROVENTIL) (2.5 MG/3ML) 0.083% nebulizer solution Take 2.5 mg by nebulization  every 4 (four) hours as needed. 01/03/20  Yes [provider]  atorvastatin (LIPITOR) 10 MG tablet Take 10 mg by mouth daily.  04/24/18  Yes [provider]  fluticasone (FLONASE) 50 MCG/ACT nasal spray Place 1 spray into both nostrils daily.   Yes [provider]  furosemide (LASIX) 20 MG tablet Take 40 mg by mouth daily. 06/28/22  Yes [provider]  hydrOXYzine (ATARAX) 10 MG tablet Take 1 tablet by mouth 3 (three) times daily as needed.   Yes [provider]  montelukast (SINGULAIR) 10 MG tablet Take 10 mg by mouth at bedtime.  11/19/19  Yes [provider]  naloxone Jonelle Sports) 4 MG/0.1ML LIQD nasal spray kit  08/18/18  Yes [provider]  pantoprazole (PROTONIX) 40 MG tablet Take 1 tablet (40 mg total) by mouth 2 (two) times daily. Patient taking differently: Take 40 mg by mouth daily. 11/08/19  Yes Jae Dire, MD  potassium chloride SA (KLOR-CON M) 20 MEQ tablet Take 2 tablets (40 mEq total) by mouth daily. On Friday and Sunday only 11/15/21  Yes Hackney, Tina A, FNP  pregabalin (LYRICA) 25 MG capsule Take 25 mg by mouth 3 (three) times daily. 06/28/22 06/28/23 Yes [provider]  spironolactone (ALDACTONE) 25 MG tablet Take 50 mg by mouth daily. 06/28/22  Yes [provider]  venlafaxine XR (EFFEXOR-XR) 75 MG 24 hr capsule Take 150 mg by mouth daily. 09/23/22  Yes [provider]  budesonide-formoterol (SYMBICORT) 160-4.5 MCG/ACT inhaler Inhale 2 puffs into the lungs daily. Patient not taking: Reported on 01/31/2022 07/02/19   [provider]  carvedilol (COREG) 3.125 MG tablet Take 3.125 mg by mouth daily. Patient not taking: Reported on 10/24/2022    [provider]  clonazePAM (KLONOPIN) 0.5 MG tablet Take 0.5 mg by mouth 2 (two) times daily. Patient not taking: Reported on 10/24/2022 01/17/22   [provider]  escitalopram (LEXAPRO) 20 MG tablet Take 20 mg by mouth every  morning. Patient not taking: Reported on 10/24/2022 01/17/22   [provider]  fluticasone furoate-vilanterol (BREO ELLIPTA) 200-25 MCG/INH AEPB Inhale 1 puff into the lungs daily. Patient not taking: Reported on 12/12/2021    [provider]  Oxycodone HCl 10 MG TABS Take 10 mg by mouth 4 (four) times daily. Patient not taking: Reported on 10/24/2022    [provider]  pregabalin (LYRICA) 75 MG capsule Take 75 mg by mouth 3 (three) times daily. Patient not taking: Reported on 10/24/2022    [provider]  SPIRIVA RESPIMAT 2.5 MCG/ACT AERS Inhale 2 puffs into the lungs daily as needed. Patient not taking: Reported on 01/31/2022 07/22/19   [provider]  spironolactone (ALDACTONE) 100 MG tablet Take 1 tablet (100 mg total) by mouth daily. MUST  MAKE APPT FOR FURTHER REFILLS Patient not taking: Reported on 10/24/2022 02/28/21   Clarisa Kindred A, FNP  TRELEGY ELLIPTA 100-62.5-25 MCG/ACT AEPB Inhale 1 puff into the lungs daily. Patient not taking: Reported on 10/24/2022 04/13/21   [provider]  triamcinolone ointment (KENALOG) 0.5 % Apply 1 application topically 2 (two) times daily. Patient not taking: Reported on 10/24/2022 08/23/19   [provider]  umeclidinium bromide (INCRUSE ELLIPTA) 62.5 MCG/INH AEPB Inhale 1 puff into the lungs daily. Patient not taking: Reported on 12/12/2021    [provider]  venlafaxine (EFFEXOR) 37.5 MG tablet Take 75 mg by mouth daily. Patient not taking: Reported on 10/24/2022    [provider]    Family History  Problem Relation Age of Onset   Heart failure Mother      Social History   Tobacco Use   Smoking status: Every Day    Packs/day: 0.25    Years: 36.00    Additional pack years: 0.00    Total pack years: 9.00    Types: Cigarettes   Smokeless tobacco: Never  Substance Use Topics   Alcohol use: Yes    Alcohol/week: 0.0 standard drinks of alcohol    Comment: 2-3  drinks/wk. Prev heavy drinker.   Drug use: Not Currently    Allergies as of 10/24/2022 - Review Complete 10/24/2022  Allergen Reaction Noted   Other Other (See Comments) 01/24/2016   Citrated iron  08/22/2021   Gelatin  08/22/2021   Mycophenolate sodium Other (See Comments) 01/13/2018   Azathioprine Nausea Only 01/13/2018   Mycophenolate mofetil Nausea Only 01/13/2018   Tacrolimus Diarrhea and Nausea Only 01/13/2018    Review of Systems:    All systems reviewed and negative except where noted in HPI.   Physical Exam:  Vital signs in last 24 hours: Temp:  [97.6 F (36.4 C)-97.9 F (36.6 C)] 97.8 F (36.6 C) (06/28 0701) Pulse Rate:  [70-89] 77 (06/28 1200) Resp:  [18-29] 29 (06/28 1200) BP: (80-116)/(53-81) 83/53 (06/28 1200) SpO2:  [90 %-100 %] 94 % (06/28 1200) Weight:  [52.1 kg-55.3 kg] 52.1 kg (06/28 0358) Last BM Date : 10/24/22 General:   Pleasant, cooperative in NAD Head:  Normocephalic and atraumatic. Eyes:   No icterus.   Conjunctiva pink. PERRLA. Ears:  Normal auditory acuity. Neck:  Supple; no masses or thyroidomegaly Lungs: Respirations even and unlabored. Lungs clear to auscultation bilaterally.   No wheezes, crackles, or rhonchi.  Heart:  Regular rate and rhythm;  Without murmur, clicks, rubs or gallops Abdomen:  Soft, mildly distended, nontender. Normal bowel sounds. No appreciable masses or hepatomegaly.  No rebound or guarding.  Rectal:  Not performed. Msk:  Symmetrical without gross deformities.    Extremities:  Without edema, cyanosis or clubbing. Neurologic:  Alert and oriented x3;  grossly normal neurologically. Skin:  Intact without significant lesions or rashes. Cervical Nodes:  No significant cervical adenopathy. Psych:  Alert and cooperative. Normal affect.  LAB RESULTS: Recent Labs    10/24/22 1013 10/25/22 0419  WBC 7.5 4.9  HGB 14.0 13.1  HCT 44.1 41.1  PLT 172 155   BMET Recent Labs    10/24/22 1013 10/25/22 0419  NA 130* 135   K 3.6 3.2*  CL 94* 102  CO2 27 28  GLUCOSE 128* 81  BUN 15 12  CREATININE 0.94 0.63  CALCIUM 9.0 8.7*   LFT Recent Labs    10/25/22 0419  PROT 6.6  ALBUMIN 2.7*  AST 23  ALT  13  ALKPHOS 64  BILITOT 1.2   PT/INR Recent Labs    10/25/22 0419  LABPROT 17.4*  INR 1.4*    STUDIES: DG Abd 2 Views  Result Date: 10/25/2022 CLINICAL DATA:  Small-bowel obstruction EXAM: ABDOMEN - 2 VIEW COMPARISON:  CT abdomen and pelvis dated 10/24/2022 FINDINGS: Excreted contrast material within the urinary bladder and likely overlapping within the rectum. Single persistently dilated loop of small bowel in the left upper quadrant. Otherwise, bowel gas pattern is nonobstructive. No acute osseous abnormality. IMPRESSION: Single persistently dilated loop of small bowel in the left upper quadrant, which may represent ileus. Otherwise, bowel gas pattern is nonobstructive. Electronically Signed   By: Agustin Cree M.D.   On: 10/25/2022 08:26   CT ABDOMEN PELVIS W CONTRAST  Result Date: 10/24/2022 CLINICAL DATA:  Acute generalized abdominal pain. EXAM: CT ABDOMEN AND PELVIS WITH CONTRAST TECHNIQUE: Multidetector CT imaging of the abdomen and pelvis was performed using the standard protocol following bolus administration of intravenous contrast. RADIATION DOSE REDUCTION: This exam was performed according to the departmental dose-optimization program which includes automated exposure control, adjustment of the mA and/or kV according to patient size and/or use of iterative reconstruction technique. CONTRAST:  OMNIPAQUE IOHEXOL 300 MG/ML  SOLN COMPARISON:  February 02, 2013. FINDINGS: Lower chest: No acute abnormality. Hepatobiliary: Status post cholecystectomy. No biliary dilatation is noted. Nodular hepatic margins are noted consistent with hepatic cirrhosis. Pancreas: Unremarkable. No pancreatic ductal dilatation or surrounding inflammatory changes. Spleen: Normal in size without focal abnormality.  Adrenals/Urinary Tract: Adrenal glands are unremarkable. Kidneys are normal, without renal calculi, focal lesion, or hydronephrosis. Bladder is unremarkable. Stomach/Bowel: Stomach is unremarkable. The appendix appears normal. There is severe duodenal and proximal jejunal dilatation most likely due to partial obstruction from significant wall thickening and inflammation or edema involving proximal jejunal loops. The colon is unremarkable. Vascular/Lymphatic: Aortic atherosclerosis. No enlarged abdominal or pelvic lymph nodes. Multiple collateral veins are noted throughout the abdomen consistent with portal venous hypertension. Reproductive: Uterus and bilateral adnexa are unremarkable. Other: Mild ascites is noted. Small fat containing periumbilical hernia is noted. Musculoskeletal: No acute or significant osseous findings. IMPRESSION: Hepatic cirrhosis with ascites and portal venous hypertension. Moderate wall thickening is seen involving jejunal bowel loops most likely due to inflammation or edema, and this appears to result in at least partial obstruction of the more proximal jejunal and duodenal small bowel. Aortic Atherosclerosis (ICD10-I70.0). Electronically Signed   By: Lupita Raider M.D.   On: 10/24/2022 11:57   CT Head Wo Contrast  Result Date: 10/24/2022 CLINICAL DATA:  Altered mental status EXAM: CT HEAD WITHOUT CONTRAST TECHNIQUE: Contiguous axial images were obtained from the base of the skull through the vertex without intravenous contrast. RADIATION DOSE REDUCTION: This exam was performed according to the departmental dose-optimization program which includes automated exposure control, adjustment of the mA and/or kV according to patient size and/or use of iterative reconstruction technique. COMPARISON:  10/14/2009 FINDINGS: Brain: No evidence of acute infarction, hemorrhage, mass, mass effect, or midline shift. No hydrocephalus or extra-axial fluid collection. Partial empty sella. Normal  craniocervical junction. Vascular: No hyperdense vessel. Skull: Negative for fracture or focal lesion. Sinuses/Orbits: No acute finding. Other: The mastoid air cells are well aerated. IMPRESSION: No acute intracranial process. Electronically Signed   By: Wiliam Ke M.D.   On: 10/24/2022 11:50   DG Chest Port 1 View  Result Date: 10/24/2022 CLINICAL DATA:  Altered mental status.  Shortness of breath EXAM: PORTABLE CHEST 1  VIEW COMPARISON:  01/25/2020 FINDINGS: Underinflation. Film is rotated to the left and is slightly kyphotic. No pneumothorax or effusion. Normal cardiopericardial silhouette. Stable interstitial changes and bronchovascular crowding. Overlapping cardiac leads. Degenerative changes of the spine IMPRESSION: Stable interstitial changes.  Underinflation. Electronically Signed   By: Karen Kays M.D.   On: 10/24/2022 10:25      Impression / Plan:   Assessment: Principal Problem:   Acute metabolic encephalopathy Active Problems:   Essential hypertension   ILD (interstitial lung disease) (HCC)   Decompensated HCV cirrhosis (HCC)   Congestive heart failure (HCC)   Obstructive sleep apnea   COPD (chronic obstructive pulmonary disease) (HCC)   Dyslipidemia   Small bowel obstruction (HCC)   Chronic back pain   GERD (gastroesophageal reflux disease)   DARCE GILDE is a 57 y.o. y/o female with cirrhosis likely due to alcohol and hepatitis C.  The patient was admitted with what appears to be hepatic encephalopathy which has resolved.  The patient is answering questions fully and is alert and orientated x 3.  The patient does have hypertension and optimizing her ascites is going to be hard with diuretics since she continues to be hypotensive.  The patient has been told that a paracentesis could give [place] information as to whether or not she has SBP as the cause of her decompensation with hepatic encephalopathy and hypotension.  Plan:  Patient has been encouraged to undergo a  paracentesis for examination of the ascitic fluid for possible SBP but states that she would like to talk to her daughter before proceeding with any procedures.  As far as maximizing her diuretics, this would be very hard with her hypotension.  Her hepatic encephalopathy has resolved since admission.  She should be discharged on lactulose and be considered for Xifaxan for her hepatic encephalopathy if the lactulose is not controlling her hepatic encephalopathy.  If she changes her mind a paracentesis should be obtained with examination of the fluid for an absolute neutrophil count.  The patient has been explained the plan agrees with it.  Thank you for involving me in the care of this patient.      LOS: 1 day   [name], MD, [facility] 10/25/2022, 1:05 PM,  Pager [phone] 7am-5pm  Check AMION for 5pm -7am coverage and on weekends   Note: This dictation was prepared with Dragon dictation along with smaller phrase technology. Any transcriptional errors that result from this process are unintentional.

## 2022-10-26 ENCOUNTER — Inpatient Hospital Stay: Payer: Medicaid Other

## 2022-10-26 DIAGNOSIS — G4733 Obstructive sleep apnea (adult) (pediatric): Secondary | ICD-10-CM | POA: Diagnosis not present

## 2022-10-26 DIAGNOSIS — J849 Interstitial pulmonary disease, unspecified: Secondary | ICD-10-CM | POA: Diagnosis not present

## 2022-10-26 DIAGNOSIS — K56609 Unspecified intestinal obstruction, unspecified as to partial versus complete obstruction: Secondary | ICD-10-CM | POA: Diagnosis not present

## 2022-10-26 DIAGNOSIS — G9341 Metabolic encephalopathy: Secondary | ICD-10-CM | POA: Diagnosis not present

## 2022-10-26 LAB — BASIC METABOLIC PANEL
Anion gap: 9 (ref 5–15)
BUN: 8 mg/dL (ref 6–20)
CO2: 22 mmol/L (ref 22–32)
Calcium: 8.2 mg/dL — ABNORMAL LOW (ref 8.9–10.3)
Chloride: 103 mmol/L (ref 98–111)
Creatinine, Ser: 0.6 mg/dL (ref 0.44–1.00)
GFR, Estimated: >60 mL/min (ref >60)
Glucose, Bld: 83 mg/dL (ref 70–99)
Potassium: 4.1 mmol/L (ref 3.5–5.1)
Sodium: 134 mmol/L — ABNORMAL LOW (ref 135–145)

## 2022-10-26 LAB — BODY FLUID CELL COUNT WITH DIFFERENTIAL
Eos, Fluid: 0 %
Lymphs, Fluid: 68 %
Monocyte-Macrophage-Serous Fluid: 31 %
Neutrophil Count, Fluid: 1 %
Total Nucleated Cell Count, Fluid: 550 cu mm

## 2022-10-26 LAB — GLUCOSE, CAPILLARY
Glucose-Capillary: 82 mg/dL (ref 70–99)
Glucose-Capillary: 84 mg/dL (ref 70–99)
Glucose-Capillary: 81 mg/dL (ref 70–99)
Glucose-Capillary: 82 mg/dL (ref 70–99)
Glucose-Capillary: 96 mg/dL (ref 70–99)
Glucose-Capillary: 99 mg/dL (ref 70–99)

## 2022-10-26 LAB — GLUCOSE, PLEURAL OR PERITONEAL FLUID: Glucose, Fluid: 84 mg/dL

## 2022-10-26 LAB — PROTEIN, PLEURAL OR PERITONEAL FLUID: Total protein, fluid: <3 g/dL

## 2022-10-26 LAB — ALBUMIN, PLEURAL OR PERITONEAL FLUID: Albumin, Fluid: <1.5 g/dL

## 2022-10-26 LAB — AMMONIA: Ammonia: 39 umol/L — ABNORMAL HIGH (ref 9–35)

## 2022-10-26 LAB — BODY FLUID CULTURE W GRAM STAIN: Gram Stain: NONE SEEN

## 2022-10-26 LAB — ANAEROBIC CULTURE W GRAM STAIN

## 2022-10-26 MED ORDER — MELATONIN 5 MG PO TABS
5.0000 mg | ORAL_TABLET | Freq: Once | ORAL | Status: AC
  Administered 2022-10-26: 5 mg via ORAL
  Filled 2022-10-26: qty 1

## 2022-10-26 MED ORDER — ALBUMIN HUMAN 25 % IV SOLN
12.5000 g | Freq: Once | INTRAVENOUS | Status: AC
  Administered 2022-10-26: 12.5 g via INTRAVENOUS
  Filled 2022-10-26: qty 50

## 2022-10-26 MED ORDER — SPIRONOLACTONE 25 MG PO TABS
100.0000 mg | ORAL_TABLET | Freq: Every day | ORAL | Status: DC
  Administered 2022-10-26 – 2022-10-27 (×2): 100 mg via ORAL
  Filled 2022-10-26 (×2): qty 4

## 2022-10-26 MED ORDER — LIDOCAINE HCL (PF) 1 % IJ SOLN
10.0000 mL | Freq: Once | INTRAMUSCULAR | Status: AC
  Administered 2022-10-26: 10 mL via INTRADERMAL

## 2022-10-26 NOTE — Progress Notes (Signed)
Inpatient Follow-up/Progress Note   Patient ID: [name] is a 57 y.o. female.  Overnight Events / Subjective Findings NAEON.  She is accompanied by her brother at bedside today.  Her mentation continues to improve.  Her daughter is on the phone during time of evaluation.  The patient has not open undergoing paracentesis. She notes that she had a bowel movement and is passing flatus.  She is hoping to advance her diet although she was not able to previously tolerate solids In discussion with nursing her blood pressure has done better on midodrine. No other acute GI complaints  Review of Systems  Constitutional:  Negative for activity change, appetite change, chills, diaphoresis, fatigue, fever and unexpected weight change.  HENT:  Negative for trouble swallowing and voice change.   Respiratory:  Negative for shortness of breath and wheezing.   Cardiovascular:  Negative for chest pain, palpitations and leg swelling.  Gastrointestinal:  Positive for abdominal pain, nausea and vomiting. Negative for abdominal distention, anal bleeding, blood in stool, constipation, diarrhea and rectal pain.  Musculoskeletal:  Negative for arthralgias and myalgias.  Skin:  Negative for color change and pallor.  Neurological:  Negative for dizziness, syncope and weakness.  Psychiatric/Behavioral:  Negative for confusion.   All other systems reviewed and are negative.    Medications  Current Facility-Administered Medications:    0.9 %  sodium chloride infusion, 250 mL, Intravenous, Continuous, Rust-Chester, Britton L, NP   albuterol (PROVENTIL) (2.5 MG/3ML) 0.083% nebulizer solution 2.5 mg, 2.5 mg, Nebulization, Q4H PRN, Brimage, Vondra, DO   Chlorhexidine Gluconate Cloth 2 % PADS 6 each, 6 each, Topical, Daily, Brimage, Vondra, DO, 6 each at 10/24/22 1729   enoxaparin (LOVENOX) injection 40 mg, 40 mg, Subcutaneous, Q24H, Brimage, Vondra, DO, 40 mg at 10/25/22 2144   furosemide (LASIX) tablet 40  mg, 40 mg, Oral, Daily, Sreeram, Narendranath, MD, 40 mg at 10/26/22 1054   midodrine (PROAMATINE) tablet 5 mg, 5 mg, Oral, TID WC, Harlon Ditty D, NP, 5 mg at 10/26/22 1100   morphine (PF) 2 MG/ML injection 2 mg, 2 mg, Intravenous, Q3H PRN, Marcelino Duster, MD, 2 mg at 10/25/22 2146   nicotine polacrilex (NICORETTE) gum 2 mg, 2 mg, Oral, PRN, Marcelino Duster, MD, 2 mg at 10/26/22 1055   ondansetron (ZOFRAN) tablet 4 mg, 4 mg, Oral, Q6H PRN **OR** ondansetron (ZOFRAN) injection 4 mg, 4 mg, Intravenous, Q6H PRN, Brimage, Vondra, DO, 4 mg at 10/25/22 2154   potassium chloride (KLOR-CON) packet 40 mEq, 40 mEq, Oral, Daily, Sreeram, Narendranath, MD, 40 mEq at 10/26/22 0812  sodium chloride      albuterol, morphine injection, nicotine polacrilex, ondansetron **OR** ondansetron (ZOFRAN) IV   Objective    Vitals:   10/26/22 0800 10/26/22 0900 10/26/22 1000 10/26/22 1100  BP: (!) 85/57 (!) 106/58 (!) 80/63 103/78  Pulse: 76 74 79 80  Resp: (!) 25 (!) 29 (!) 28 (!) 22  Temp:      TempSrc:      SpO2: 97% 94% 92% 97%  Weight:      Height:         Physical Exam Vitals and nursing note reviewed.  Constitutional:      General: She is not in acute distress.    Appearance: She is obese. She is ill-appearing. She is not toxic-appearing or diaphoretic.  HENT:     Head: Normocephalic and atraumatic.     Nose: Nose normal.     Mouth/Throat:     Mouth:  Mucous membranes are moist.     Pharynx: Oropharynx is clear.  Eyes:     General: No scleral icterus.    Extraocular Movements: Extraocular movements intact.  Cardiovascular:     Rate and Rhythm: Normal rate and regular rhythm.     Heart sounds: Normal heart sounds. No murmur heard.    No friction rub. No gallop.  Pulmonary:     Effort: Pulmonary effort is normal. No respiratory distress.     Breath sounds: Normal breath sounds. No wheezing, rhonchi or rales.  Abdominal:     General: There is no distension.     Tenderness:  There is abdominal tenderness. There is no guarding (epigastric and LUQ region) or rebound.  Musculoskeletal:     Cervical back: Neck supple.  Skin:    General: Skin is warm and dry.     Coloration: Skin is not jaundiced or pale.  Neurological:     General: No focal deficit present.     Mental Status: She is alert and oriented to person, place, and time. Mental status is at baseline.  Psychiatric:        Mood and Affect: Mood normal.        Behavior: Behavior normal.        Thought Content: Thought content normal.        Judgment: Judgment normal.      Laboratory Data Recent Labs  Lab 10/24/22 1013 10/25/22 0419  WBC 7.5 4.9  HGB 14.0 13.1  HCT 44.1 41.1  PLT 172 155  NEUTOPHILPCT 73  --   LYMPHOPCT 12  --   MONOPCT 13  --   EOSPCT 1  --    Recent Labs  Lab 10/24/22 1013 10/25/22 0419 10/26/22 0347  NA 130* 135 134*  K 3.6 3.2* 4.1  CL 94* 102 103  CO2 27 28 22   BUN 15 12 8   CREATININE 0.94 0.63 0.60  CALCIUM 9.0 8.7* 8.2*  PROT 8.0 6.6  --   BILITOT 1.8* 1.2  --   ALKPHOS 81 64  --   ALT 16 13  --   AST 30 23  --   GLUCOSE 128* 81 83   Recent Labs  Lab 10/25/22 0419  INR 1.4*      Imaging Studies: DG Abd 2 Views  Result Date: 10/25/2022 CLINICAL DATA:  Small-bowel obstruction EXAM: ABDOMEN - 2 VIEW COMPARISON:  CT abdomen and pelvis dated 10/24/2022 FINDINGS: Excreted contrast material within the urinary bladder and likely overlapping within the rectum. Single persistently dilated loop of small bowel in the left upper quadrant. Otherwise, bowel gas pattern is nonobstructive. No acute osseous abnormality. IMPRESSION: Single persistently dilated loop of small bowel in the left upper quadrant, which may represent ileus. Otherwise, bowel gas pattern is nonobstructive. Electronically Signed   By: Agustin Cree M.D.   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 Would not remove more than 5L  as it does not seem she's required large volume paracentesis before  Lasix 40 mg was restarted today. Recommend if restarting lasix, then spironolactone would also be indicated in 100mg  (spironolactone) to 40 mg (furosemide) ratio in setting of cirrhosis  Pt tolerating midodrine at this time. Only on 5mg  tid. Can uptitrate as needed Can also provide albumin in setting of hypotension  Patient would like to advance her diet. She has not tolerated large solids given suspicion for possible SBO. Can trial liquids - defer to surgical team Appreciate surgery team  CMP CBC INR Daily No need to trend/track ammonia level  Patient improved after only 1 dose of lactulose- which raises  question if this was truly hepatic encephalopathy vs other etiology and if she needs to continue lactulose/xifaxan as outpatient  No signs of gib currently Pt needs to follow up outpatient with her Hepatology group  I personally performed the service.  Management of other medical comorbidities as per primary team  Thank you for allowing [place] to participate in this patient's care. Please don't hesitate to call if any questions or concerns arise.   Jaynie Collins, DO [facility]  Portions of the record may have been created with voice recognition software. Occasional wrong-word or 'sound-a-like' substitutions may have occurred due to the inherent limitations of voice recognition software.  Read the chart carefully and recognize,  using context, where substitutions may have occurred.

## 2022-10-26 NOTE — Progress Notes (Signed)
  Progress Note   Patient: [name] ZOX:[id number] DOB: [birth year] DOA: 10/24/2022     2 DOS: the patient was seen and examined on 10/26/2022   Brief hospital course: [name] is a 57 y.o. female with medical history significant of hepatitis C with subsequent cirrhosis, diastolic heart failure,  COPD, ILD (2 L oxygen), HTN, GERD, chronic back pain who presented to ED for altered mental status. Patient admitted to [facility] for SBO and volume overload. Since getting home from the hospital she has been becoming short of breath, confused and was not responding as she would normally. She has been nauseated. Last paracentesis was during her admission at [facility]. She smokes cigarettes but stopped drinking alcohol a few months ago. Daughter unsure when the last EtOH drink was.  Patient is admitted for further management evaluation of hepatic encephalopathy, partial small bowel obstruction, decompensated cirrhosis secondary to hep C.  Assessment and Plan: Acute metabolic encephalopathy Possibly hepatic encephalopathy in the setting of decompensated liver cirrhosis- Ammonia level trending down. Continue Lactulose. Lasix, spironolactone restarted GI consult advised paracentesis which is ordered. Respiratory status stable.  Transfer to progressive care unit from ICU.  Suspected partial small bowel obstruction- General surgery follow up appreciated. No surgical intervention. Patient is able to pass gas. She is able to tolerate diet. Clear liquid diet and advance as tolerated.  Interstitial lung disease COPD- Continue albuterol nebs. Continue 2 L home oxygen.  Chronic diastolic heart failure- Home medications spironolactone, lasix resumed. Hypokalemia improved. Will stop supplementation.  Hypotension- Caution with antihypertensive medications.  Chronic back pain- She is on opiate medications. Advised to limit use of opiates given her low blood pressures.  Anxiety continue  Effexor, Atarax.  GERD continue pantoprazole therapy.  OSA on CPAP at bedtime.  DVT prophylaxis-SCD. CODE STATUS full code.      Subjective: Patient is seen and examined today morning.  She is sleeping but arousable, able to answer me.  Abdominal discomfort better, awaiting paracentesis.  Physical Exam: Vitals:   10/26/22 0800 10/26/22 0900 10/26/22 1000 10/26/22 1100  BP: (!) 85/57 (!) 106/58 (!) 80/63 103/78  Pulse: 76 74 79 80  Resp: (!) 25 (!) 29 (!) 28 (!) 22  Temp:      TempSrc:      SpO2: 97% 94% 92% 97%  Weight:      Height:       General -middle-aged ill-looking African-American female, no apparent distress HEENT - PERRLA, EOMI, atraumatic head, non tender sinuses. Lung - Clear, diffuse rhonchi. Heart - S1, S2 heard, no murmurs, rubs, trace pedal edema. Abdomen distended, fluid thrill positive, lower abdominal tenderness Neuro - Alert, awake and oriented x 3, non focal exam. Skin - Warm and dry. Data Reviewed:  Blood sugars, BMP  Family Communication: Patient's daughter please call our phone and I updated her regarding the current plan.  Disposition: Status is: Inpatient Remains inpatient appropriate because: Suspected partial bowel obstruction, hepatic encephalopathy  Planned Discharge Destination: Home with Home Health    MDM level 3-patient is admitted for hepatic encephalopathy, suspected small bowel obstruction, electrolyte derangements and is at high risk for clinical deterioration.  Author: Marcelino Duster, MD 10/26/2022 2:51 PM  For on call review [email or web address].

## 2022-10-26 NOTE — Procedures (Signed)
PROCEDURE SUMMARY:  Successful image-guided paracentesis from the right lower abdomen.  Yielded 2.9 liters of clear yellow fluid.  No immediate complications.  EBL = trace. Patient tolerated well.   Specimen was sent for labs.  Please see imaging section of Epic for full dictation.   Kennieth Francois PA-C 10/26/2022 2:36 PM

## 2022-10-27 DIAGNOSIS — J449 Chronic obstructive pulmonary disease, unspecified: Secondary | ICD-10-CM

## 2022-10-27 DIAGNOSIS — J849 Interstitial pulmonary disease, unspecified: Secondary | ICD-10-CM | POA: Diagnosis not present

## 2022-10-27 DIAGNOSIS — B1920 Unspecified viral hepatitis C without hepatic coma: Secondary | ICD-10-CM | POA: Diagnosis not present

## 2022-10-27 DIAGNOSIS — R188 Other ascites: Secondary | ICD-10-CM

## 2022-10-27 DIAGNOSIS — I5032 Chronic diastolic (congestive) heart failure: Secondary | ICD-10-CM | POA: Diagnosis not present

## 2022-10-27 DIAGNOSIS — G9341 Metabolic encephalopathy: Secondary | ICD-10-CM | POA: Diagnosis not present

## 2022-10-27 LAB — COMPREHENSIVE METABOLIC PANEL
ALT: 14 U/L (ref 0–44)
AST: 28 U/L (ref 15–41)
Albumin: 2.5 g/dL — ABNORMAL LOW (ref 3.5–5.0)
Alkaline Phosphatase: 60 U/L (ref 38–126)
Anion gap: 4 — ABNORMAL LOW (ref 5–15)
BUN: 9 mg/dL (ref 6–20)
CO2: 24 mmol/L (ref 22–32)
Calcium: 8.2 mg/dL — ABNORMAL LOW (ref 8.9–10.3)
Chloride: 104 mmol/L (ref 98–111)
Creatinine, Ser: 0.76 mg/dL (ref 0.44–1.00)
GFR, Estimated: >60 mL/min (ref >60)
Glucose, Bld: 103 mg/dL — ABNORMAL HIGH (ref 70–99)
Potassium: 3.9 mmol/L (ref 3.5–5.1)
Sodium: 132 mmol/L — ABNORMAL LOW (ref 135–145)
Total Bilirubin: 0.7 mg/dL (ref 0.3–1.2)
Total Protein: 5.9 g/dL — ABNORMAL LOW (ref 6.5–8.1)

## 2022-10-27 LAB — CBC
HCT: 39.5 % (ref 36.0–46.0)
Hemoglobin: 12.7 g/dL (ref 12.0–15.0)
MCH: 28.7 pg (ref 26.0–34.0)
MCHC: 32.2 g/dL (ref 30.0–36.0)
MCV: 89.4 fL (ref 80.0–100.0)
Platelets: 149 10*3/uL — ABNORMAL LOW (ref 150–400)
RBC: 4.42 MIL/uL (ref 3.87–5.11)
RDW: 16.1 % — ABNORMAL HIGH (ref 11.5–15.5)
WBC: 5.2 10*3/uL (ref 4.0–10.5)
nRBC: 0 % (ref 0.0–0.2)

## 2022-10-27 LAB — PROTIME-INR
INR: 1.4 — ABNORMAL HIGH (ref 0.8–1.2)
Prothrombin Time: 17.3 seconds — ABNORMAL HIGH (ref 11.4–15.2)

## 2022-10-27 LAB — BODY FLUID CULTURE W GRAM STAIN

## 2022-10-27 LAB — GLUCOSE, CAPILLARY
Glucose-Capillary: 121 mg/dL — ABNORMAL HIGH (ref 70–99)
Glucose-Capillary: 110 mg/dL — ABNORMAL HIGH (ref 70–99)

## 2022-10-27 MED ORDER — NICOTINE POLACRILEX 2 MG MT GUM: 2.0000 mg | CHEWING_GUM | OROMUCOSAL | 0 refills | Status: AC | PRN

## 2022-10-27 MED ORDER — LACTULOSE 10 GM/15ML PO SOLN: 20.0000 g | Freq: Two times a day (BID) | ORAL | 0 refills | Status: AC

## 2022-10-27 MED ORDER — CIPROFLOXACIN HCL 500 MG PO TABS: 500.0000 mg | ORAL_TABLET | Freq: Two times a day (BID) | ORAL | 0 refills | Status: AC

## 2022-10-27 MED ORDER — CIPROFLOXACIN HCL 500 MG PO TABS
500.0000 mg | ORAL_TABLET | Freq: Two times a day (BID) | ORAL | Status: DC
  Administered 2022-10-27: 500 mg via ORAL
  Filled 2022-10-27 (×2): qty 1

## 2022-10-27 MED ORDER — LACTULOSE 10 GM/15ML PO SOLN
20.0000 g | Freq: Two times a day (BID) | ORAL | Status: DC
  Administered 2022-10-27: 20 g via ORAL
  Filled 2022-10-27: qty 30

## 2022-10-27 MED ORDER — ALBUMIN HUMAN 25 % IV SOLN
25.0000 g | Freq: Once | INTRAVENOUS | Status: AC
  Administered 2022-10-27: 25 g via INTRAVENOUS
  Filled 2022-10-27: qty 100

## 2022-10-27 MED ORDER — SPIRONOLACTONE 25 MG PO TABS: 100.0000 mg | ORAL_TABLET | Freq: Every day | ORAL | 2 refills | Status: AC

## 2022-10-27 MED ORDER — MIDODRINE HCL 5 MG PO TABS: 5.0000 mg | ORAL_TABLET | Freq: Three times a day (TID) | ORAL | 2 refills | Status: AC

## 2022-10-27 NOTE — Progress Notes (Signed)
Inpatient Follow-up/Progress Note   Patient ID: ICESS [name] is a 57 y.o. female.  Overnight Events / Subjective Findings NAEON.  Underwent paracentesis yesterday with 2-1/2 [name] removed.  No SBP.  She reports that her abdominal discomfort feels little bit better after this.  She was able to tolerate some more solid diet. Feels like she has to have a bowel movement.  She is passing flatus. Nausea is limited and she has had no further vomiting. No other acute GI complaints  Review of Systems  Constitutional:  Negative for activity change, appetite change, chills, diaphoresis, fatigue, fever and unexpected weight change.  HENT:  Negative for trouble swallowing and voice change.   Respiratory:  Negative for shortness of breath and wheezing.   Cardiovascular:  Negative for chest pain, palpitations and leg swelling.  Gastrointestinal:  Positive for abdominal pain and nausea. Negative for abdominal distention, anal bleeding, blood in stool, constipation, diarrhea, rectal pain and vomiting.  Musculoskeletal:  Negative for arthralgias and myalgias.  Skin:  Negative for color change and pallor.  Neurological:  Negative for dizziness, syncope and weakness.  Psychiatric/Behavioral:  Negative for confusion.   All other systems reviewed and are negative.    Medications  Current Facility-Administered Medications:    0.9 %  sodium chloride infusion, 250 mL, Intravenous, Continuous, [name], Brittany L, NP   albuterol (PROVENTIL) (2.5 MG/3ML) 0.083% nebulizer solution 2.5 mg, 2.5 mg, Nebulization, Q4H PRN, [name], Vondra, [name]   Chlorhexidine Gluconate Cloth 2 % PADS 6 each, 6 each, Topical, Daily, [name], Vondra, [name], 6 each at 10/24/22 1729   enoxaparin (LOVENOX) injection 40 mg, 40 mg, Subcutaneous, Q24H, [name], Vondra, [name], 40 mg at 10/25/22 2144   furosemide (LASIX) tablet 40 mg, 40 mg, Oral, Daily, [name], Narendranath, [name], 40 mg at 10/26/22 1054   midodrine (PROAMATINE) tablet 5 mg, 5  mg, Oral, TID WC, [name] D, NP, 5 mg at 10/26/22 1636   morphine (PF) 2 MG/ML injection 2 mg, 2 mg, Intravenous, Q3H PRN, Brittany Duster, [name], 2 mg at 10/26/22 1636   nicotine polacrilex (NICORETTE) gum 2 mg, 2 mg, Oral, PRN, Brittany Duster, [name], 2 mg at 10/27/22 0616   ondansetron (ZOFRAN) tablet 4 mg, 4 mg, Oral, Q6H PRN **OR** ondansetron (ZOFRAN) injection 4 mg, 4 mg, Intravenous, Q6H PRN, [name], Vondra, [name], 4 mg at 10/26/22 1940   spironolactone (ALDACTONE) tablet 100 mg, 100 mg, Oral, Daily, [name], Narendranath, [name], 100 mg at 10/26/22 1636  sodium chloride      albuterol, morphine injection, nicotine polacrilex, ondansetron **OR** ondansetron (ZOFRAN) IV   Objective    Vitals:   10/27/22 0400 10/27/22 0500 10/27/22 0600 10/27/22 0615  BP: (!) 75/47 (!) 73/48  (!) 87/65  Pulse: 65 64 79 74  Resp: (!) 22 (!) 27 (!) 25 (!) 29  Temp: 98.3 F (36.8 C)     TempSrc: Axillary     SpO2: 95% 97% 96% 93%  Weight:      Height:         Physical Exam Vitals and nursing note reviewed.  Constitutional:      General: She is not in acute distress.    Appearance: She is obese. She is ill-appearing. She is not toxic-appearing or diaphoretic.  HENT:     Head: Normocephalic and atraumatic.     Nose: Nose normal.     Mouth/Throat:     Mouth: Mucous membranes are moist.     Pharynx: Oropharynx is clear.  Eyes:  General: No scleral icterus.    Extraocular Movements: Extraocular movements intact.  Cardiovascular:     Rate and Rhythm: Normal rate and regular rhythm.     Heart sounds: Normal heart sounds. No murmur heard.    No friction rub. No gallop.  Pulmonary:     Effort: Pulmonary effort is normal. No respiratory distress.     Breath sounds: Normal breath sounds. No wheezing, rhonchi or rales.  Abdominal:     General: There is no distension.     Tenderness: There is abdominal tenderness. There is no guarding (epigastric and LUQ region) or rebound.   Musculoskeletal:     Cervical back: Neck supple.  Skin:    General: Skin is warm and dry.     Coloration: Skin is not jaundiced or pale.  Neurological:     General: No focal deficit present.     Mental Status: She is alert and oriented to person, place, and time. Mental status is at baseline.  Psychiatric:        Mood and Affect: Mood normal.        Behavior: Behavior normal.        Thought Content: Thought content normal.        Judgment: Judgment normal.      Laboratory Data Recent Labs  Lab 10/24/22 1013 10/25/22 0419 10/27/22 0659  WBC 7.5 4.9 5.2  HGB 14.0 13.1 12.7  HCT 44.1 41.1 39.5  PLT 172 155 149*  NEUTOPHILPCT 73  --   --   LYMPHOPCT 12  --   --   MONOPCT 13  --   --   EOSPCT 1  --   --     Recent Labs  Lab 10/24/22 1013 10/25/22 0419 10/26/22 0347  NA 130* 135 134*  K 3.6 3.2* 4.1  CL 94* 102 103  CO2 27 28 22   BUN 15 12 8   CREATININE 0.94 0.63 0.60  CALCIUM 9.0 8.7* 8.2*  PROT 8.0 6.6  --   BILITOT 1.8* 1.2  --   ALKPHOS 81 64  --   ALT 16 13  --   AST 30 23  --   GLUCOSE 128* 81 83    Recent Labs  Lab 10/25/22 0419  INR 1.4*       Imaging Studies: US Paracentesis  Result Date: 10/26/2022 INDICATION: Ascites due to alcoholic cirrhosis. Request received for diagnostic and therapeutic paracentesis with 3 [name] max per Dr. Clide Dales EXAM: ULTRASOUND GUIDED diagnostic and therapeutic PARACENTESIS MEDICATIONS: 6 cc 1% lidocaine COMPLICATIONS: None immediate. PROCEDURE: Informed written consent was obtained from the patient after a discussion of the risks, benefits and alternatives to treatment. A timeout was performed prior to the initiation of the procedure. Initial ultrasound scanning demonstrates a large amount of ascites within the right lower abdominal quadrant. The right lower abdomen was prepped and draped in the usual sterile fashion. 1% lidocaine was used for local anesthesia. Following this, a 19 gauge, 7-cm, Yueh catheter was introduced.  An ultrasound image was saved for documentation purposes. The paracentesis was performed. The catheter was removed and a dressing was applied. The patient tolerated the procedure well without immediate post procedural complication. FINDINGS: A total of approximately 2.9 [name] of clear yellow fluid was removed. Samples were sent to the laboratory as requested by the clinical team. IMPRESSION: Successful ultrasound-guided paracentesis yielding 2.9 liters of peritoneal fluid. Procedure performed by Mina Marble, PA-C PLAN: If the patient eventually requires >/=2 paracenteses in a 30 day period,  candidacy for formal evaluation by the [facility]pert[facility] will be assessed. Roanna Banning, [name] [facility] Radiology Electronically Signed   By: Roanna Banning M.D.   On: 10/26/2022 15:31   DG Abd 2 Views  Result Date: 10/25/2022 CLINICAL DATA:  Small-bowel obstruction EXAM: ABDOMEN - 2 VIEW COMPARISON:  CT abdomen and pelvis dated 10/24/2022 FINDINGS: Excreted contrast material within the urinary bladder and likely overlapping within the rectum. Single persistently dilated loop of small bowel in the left upper quadrant. Otherwise, bowel gas pattern is nonobstructive. No acute osseous abnormality. IMPRESSION: Single persistently dilated loop of small bowel in the left upper quadrant, which may represent ileus. Otherwise, bowel gas pattern is nonobstructive. Electronically Signed   By: Agustin Cree M.D.   On: 10/25/2022 08:26    Assessment:   # Cirrhosis 2/2 etoh and HCV - MELD 3.0 (15); CP-B - s/p harvoni  # Acute encephalopathy- resolving - pt has not received since one time dose on 6/27 with improvement in encephlopathy  # Hypotension- likely 2/2 cirrhosis - improving with midodrine- currently 5mg  tid  # SBO - surgery following - had bm o/n and passing flatus  # portal htn with h/o esophageal varices (2021)  # Obese #  Tobacco dependence # polymyositis associated ILD  Plan:  Paracentesis performed with 2 and half liters removed and no SBP upon my review Lasix and Aldactone reinitiated at 40 mg and 100 mg respectively Reinitiate lactulose to help encourage bowel movements -No current encephalopathy Consider increasing midodrine dose given the patient remains fairly hypotensive despite albumin infusions Tolerating more solid food- defer to surgical team   CMP CBC INR Daily No need to trend/track ammonia level  Patient improved after only 1 dose of lactulose- which raises  question if this was truly hepatic encephalopathy vs other etiology and if she needs to continue lactulose/xifaxan as outpatient. Currently restarting from constipation perspective  No signs of gib currently Pt needs to follow up outpatient with her Hepatology group  I personally performed the service.  Management of other medical comorbidities as per primary team  Thank you for allowing [place] to participate in this patient's care. Please don't hesitate to call if any questions or concerns arise.   Jaynie Collins, [name] [facility]  Portions of the record may have been created with voice recognition software. Occasional wrong-word or 'sound-a-like' substitutions may have occurred due to the inherent limitations of voice recognition software.  Read the chart carefully and recognize, using context, where substitutions may have occurred.

## 2022-10-27 NOTE — Progress Notes (Signed)
CSW has not been able to find a home health agency willing to accept patients insurance. CSW also reached out to patients previous home health agency [facility] home health, [phone]). Darius informed CSW they cannot take patient back.

## 2022-10-27 NOTE — Progress Notes (Signed)
Patient is not able to walk the distance required to go the bathroom, or he/she is unable to safely negotiate stairs required to access the bathroom.  A 3in1 BSC will alleviate this problem  

## 2022-10-27 NOTE — Evaluation (Signed)
Physical Therapy Evaluation Patient Details Name: [name] MRN: [id number] DOB: January 28, 1966 Today's Date: 10/27/2022  History of Present Illness  Pt is a 57 y/o female admitted secondary to AMS and found to have acute metabolic encephalopathy. PMH including but not limited to hepatitis C with subsequent cirrhosis, diastolic heart failure,  COPD, ILD (2 L oxygen), HTN, GERD, chronic back pain.   Clinical Impression  Pt presented supine in bed with HOB elevated, awake and willing to participate in therapy session. Prior to admission, pt reported that she was independent with all functional mobility and ADLs. Pt lives with her husband, children, several grandchildren (6) and three dogs. She is planning to d/c to one of her daughter's homes where it is just the daughter and daughter's fiance to allow her to fully recover. At the time of evaluation, pt was able to complete bed mobility and transfers at a mod I level. She ambulated in the hallway with use of RW and supervision for safety. Pt would continue to benefit from skilled physical therapy services at this time while admitted and after d/c to address the below listed limitations in order to improve overall safety and independence with functional mobility.  All VSS throughout. Pt on 2L of O2 initially but able to ambulate on RA with SpO2 maintaining >90% throughout.     Recommendations for follow up therapy are one component of a multi-disciplinary discharge planning process, led by the attending physician.  Recommendations may be updated based on patient status, additional functional criteria and insurance authorization.  Follow Up Recommendations       Assistance Recommended at Discharge Intermittent Supervision/Assistance  Patient can return home with the following  A little help with bathing/dressing/bathroom;Assistance with cooking/housework;Assist for transportation;Help with stairs or ramp for entrance    Equipment  Recommendations Rollator (4 wheels);BSC/3in1  Recommendations for Other Services       Functional Status Assessment Patient has had a recent decline in their functional status and demonstrates the ability to make significant improvements in function in a reasonable and predictable amount of time.     Precautions / Restrictions Precautions Precautions: Fall Restrictions Weight Bearing Restrictions: No      Mobility  Bed Mobility Overal bed mobility: Modified Independent                  Transfers Overall transfer level: Modified independent Equipment used: Rolling walker (2 wheels)                    Ambulation/Gait Ambulation/Gait assistance: Supervision Gait Distance (Feet): 200 Feet Assistive device: Rolling walker (2 wheels) Gait Pattern/deviations: Step-through pattern Gait velocity: decreased     General Gait Details: pt with slow, steady gait with use of RW, no LOB or need for physical assistance  Stairs            Wheelchair Mobility    Modified Rankin (Stroke Patients Only)       Balance Overall balance assessment: Needs assistance Sitting-balance support: Feet supported Sitting balance-Leahy Scale: Good     Standing balance support: During functional activity, No upper extremity supported Standing balance-Leahy Scale: Fair                               Pertinent Vitals/Pain Pain Assessment Pain Assessment: 0-10 Pain Score: 2  Pain Location: abdomen Pain Descriptors / Indicators: Guarding Pain Intervention(s): Monitored during session, Repositioned    Home Living Family/patient expects  to be discharged to:: Private residence Living Arrangements: Children Available Help at Discharge: Family;Available PRN/intermittently Type of Home: House Home Access: Stairs to enter Entrance Stairs-Rails: None Entrance Stairs-Number of Steps: 2-3   Home Layout: One level Home Equipment: None Additional Comments: pt is  planning to d/c to her daughter's home, info above is for daughter's house    Prior Function Prior Level of Function : Independent/Modified Independent             Mobility Comments: Independent ADLs Comments: Independent     Hand Dominance        Extremity/Trunk Assessment   Upper Extremity Assessment Upper Extremity Assessment: Defer to OT evaluation;Overall [facility] for tasks assessed    Lower Extremity Assessment Lower Extremity Assessment: Overall WFL for tasks assessed       Communication   Communication: No difficulties  Cognition Arousal/Alertness: Awake/alert Behavior During Therapy: WFL for tasks assessed/performed Overall Cognitive Status: Within Functional Limits for tasks assessed                                          General Comments      Exercises     Assessment/Plan    PT Assessment Patient needs continued PT services  PT Problem List Decreased strength;Decreased balance;Decreased mobility;Decreased coordination;Decreased knowledge of use of DME;Decreased safety awareness       PT Treatment Interventions DME instruction;Gait training;Stair training;Functional mobility training;Therapeutic activities;Therapeutic exercise;Balance training;Neuromuscular re-education;Patient/family education    PT Goals (Current goals can be found in the Care Plan section)  Acute Rehab PT Goals Patient Stated Goal: to d/c to daughter's house today PT Goal Formulation: With patient Time For Goal Achievement: 11/10/22 Potential to Achieve Goals: Good    Frequency Min 3X/week     Co-evaluation               AM-PAC PT "6 Clicks" Mobility  Outcome Measure Help needed turning from your back to your side while in a flat bed without using bedrails?: None Help needed moving from lying on your back to sitting on the side of a flat bed without using bedrails?: None Help needed moving to and from a bed to a chair (including a wheelchair)?:  None Help needed standing up from a chair using your arms (e.g., wheelchair or bedside chair)?: None Help needed to walk in hospital room?: A Little Help needed climbing 3-5 steps with a railing? : A Little 6 Click Score: 22    End of Session   Activity Tolerance: Patient tolerated treatment well Patient left: with call bell/phone within reach;Other (comment) (seated on [facility]) Nurse Communication: Mobility status;Other (comment) (pt remained seated on BSC at end of session) PT Visit Diagnosis: Other abnormalities of gait and mobility (R26.89)    Time: 1610-9604 PT Time Calculation (min) (ACUTE ONLY): 25 min   Charges:   PT Evaluation $PT Eval Moderate Complexity: 1 Mod PT Treatments $Gait Training: 8-22 mins        Arletta Bale, DPT  Acute Rehabilitation Services Office [phone]   Alessandra Bevels Avishai Reihl 10/27/2022, 1:22 PM

## 2022-10-27 NOTE — Discharge Summary (Signed)
Physician Discharge Summary   Patient: [name] MRN: [id number] DOB: [birth year]  Admit date:     10/24/2022  Discharge date: 10/27/22  Discharge Physician: Marcelino Duster   [facility]   Recommendations at discharge:    PCP follow up in 1 week, compliance with medications advised. Primary GI follow up as scheduled.  Discharge Diagnoses: Principal Problem:   Acute metabolic encephalopathy Active Problems:   COPD (chronic obstructive pulmonary disease) (HCC)   Dyslipidemia   Essential hypertension   ILD (interstitial lung disease) (HCC)   Decompensated HCV cirrhosis (HCC)   Congestive heart failure (HCC)   Obstructive sleep apnea   Small bowel obstruction (HCC)   Chronic back pain   GERD (gastroesophageal reflux disease)  Resolved Problems:   * No resolved hospital problems. *  Hospital Course: SHAWNDELL [name] is a 57 y.o. female with medical history significant of hepatitis C with subsequent cirrhosis, diastolic heart failure,  COPD, ILD (2 L oxygen), HTN, GERD, chronic back pain who presented to ED for altered mental status. Patient admitted to [facility] for SBO and volume overload. Since getting home from the hospital she has been becoming short of breath, confused and was not responding as she would normally. She has been nauseated. Last paracentesis was during her admission at [facility]. She smokes cigarettes but stopped drinking alcohol a few months ago. Daughter unsure when the last EtOH drink was.    Patient is admitted for further management evaluation of hepatic encephalopathy, partial small bowel obstruction, decompensated cirrhosis secondary to hep C.  Patient is started on lactulose therapy, surgery consulted for suspected partial small bowel obstruction who did not recommend surgical intervention.  Patient started to pass gas, able to tolerate clears advised to advance diet as tolerated.  Patient is continued on 2 L supplemental oxygen.GI evaluated  the patient recommended paracentesis and to continue lactulose, Lasix and spironolactone.  Patient had ultrasound-guided paracentesis 2.9 L fluid removed. Given her hypotension patient is started on midodrine therapy.  Patient's mental status gradually improved and she is able to work with physical therapy, ambulate well. I advised her to be compliant with medications, limit opiate medications.  Patient's electrolytes closely monitored and replaced accordingly.  PT OT evaluated her advised rollator, bedside commode, home health services.  Patient is hemodynamically stable to be discharged home with her daughter.  New prescription sent to pharmacy.  Advised PCP follow-up upon discharge as instructed.  She understands and agrees with the discharge plan.       Consultants: GI  Procedures performed: Paracentesis with 2.9 L fluid removed.  Disposition: Home Diet recommendation:  Discharge Diet Orders (From admission, onward)     Start     Ordered   10/27/22 0000  Diet - low sodium heart healthy        10/27/22 1101           Cardiac diet DISCHARGE MEDICATION: Allergies as of 10/27/2022       Reactions   Other Other (See Comments)   Gel capsules causes GI problems   Citrated Iron    GI upset   Mycophenolate Sodium Other (See Comments)   transaminitis   Azathioprine Nausea Only   Mycophenolate Mofetil Nausea Only   Tacrolimus Diarrhea, Nausea Only        Medication List     STOP taking these medications    Breo Ellipta 200-25 MCG/ACT Aepb Generic drug: fluticasone furoate-vilanterol   carvedilol 3.125 MG tablet Commonly known as: COREG  clonazePAM 0.5 MG tablet Commonly known as: KLONOPIN   escitalopram 20 MG tablet Commonly known as: LEXAPRO   Incruse Ellipta 62.5 MCG/ACT Aepb Generic drug: umeclidinium bromide   Oxycodone HCl 10 MG Tabs   potassium chloride SA 20 MEQ tablet Commonly known as: KLOR-CON M   Trelegy Ellipta 100-62.5-25 MCG/ACT Aepb Generic  drug: Fluticasone-Umeclidin-Vilant       TAKE these medications    albuterol 108 (90 Base) MCG/ACT inhaler Commonly known as: VENTOLIN HFA Inhale 1-2 puffs into the lungs every 6 (six) hours as needed for wheezing or shortness of breath.   albuterol (2.5 MG/3ML) 0.083% nebulizer solution Commonly known as: PROVENTIL Take 2.5 mg by nebulization every 4 (four) hours as needed.   atorvastatin 10 MG tablet Commonly known as: LIPITOR Take 10 mg by mouth daily.   budesonide-formoterol 160-4.5 MCG/ACT inhaler Commonly known as: SYMBICORT Inhale 2 puffs into the lungs daily.   ciprofloxacin 500 MG tablet Commonly known as: CIPRO Take 1 tablet (500 mg total) by mouth 2 (two) times daily for 7 days.   fluticasone 50 MCG/ACT nasal spray Commonly known as: FLONASE Place 1 spray into both nostrils daily.   furosemide 20 MG tablet Commonly known as: LASIX Take 40 mg by mouth daily.   hydrOXYzine 10 MG tablet Commonly known as: ATARAX Take 1 tablet by mouth 3 (three) times daily as needed.   lactulose 10 GM/15ML solution Commonly known as: CHRONULAC Take 30 mLs (20 g total) by mouth 2 (two) times daily.   midodrine 5 MG tablet Commonly known as: PROAMATINE Take 1 tablet (5 mg total) by mouth 3 (three) times daily with meals.   montelukast 10 MG tablet Commonly known as: SINGULAIR Take 10 mg by mouth at bedtime.   Narcan 4 MG/0.1ML Liqd nasal spray kit Generic drug: naloxone   nicotine polacrilex 2 MG gum Commonly known as: NICORETTE Take 1 each (2 mg total) by mouth as needed for smoking cessation.   pantoprazole 40 MG tablet Commonly known as: Protonix Take 1 tablet (40 mg total) by mouth 2 (two) times daily. What changed: when to take this   pregabalin 25 MG capsule Commonly known as: LYRICA Take 25 mg by mouth 3 (three) times daily. What changed: Another medication with the same name was removed. Continue taking this medication, and follow the directions you see  here.   Spiriva Respimat 2.5 MCG/ACT Aers Generic drug: Tiotropium Bromide Monohydrate Inhale 2 puffs into the lungs daily as needed.   spironolactone 25 MG tablet Commonly known as: ALDACTONE Take 4 tablets (100 mg total) by mouth daily. What changed:  how much to take Another medication with the same name was removed. Continue taking this medication, and follow the directions you see here.   triamcinolone ointment 0.5 % Commonly known as: KENALOG Apply 1 application topically 2 (two) times daily.   venlafaxine XR 75 MG 24 hr capsule Commonly known as: EFFEXOR-XR Take 150 mg by mouth daily. What changed: Another medication with the same name was removed. Continue taking this medication, and follow the directions you see here.        Discharge Exam: Filed Weights   10/24/22 0949 10/24/22 1730 10/25/22 0358  Weight: 79.4 kg 55.3 kg 52.1 kg   General -middle-aged ill-looking African-American female, no apparent distress HEENT - PERRLA, EOMI, atraumatic head, non tender sinuses. Lung - Clear, diffuse rhonchi. Heart - S1, S2 heard, no murmurs, rubs, trace pedal edema. Abdomen distended, fluid thrill positive, lower abdominal tenderness Neuro -  Alert, awake and oriented x 3, non focal exam. Skin - Warm and dry.  Condition at discharge: stable  The results of significant diagnostics from this hospitalization (including imaging, microbiology, ancillary and laboratory) are listed below for reference.   Imaging Studies: US Paracentesis  Result Date: 10/26/2022 INDICATION: Ascites due to alcoholic cirrhosis. Request received for diagnostic and therapeutic paracentesis with 3 L max per Dr. Clide Dales EXAM: ULTRASOUND GUIDED diagnostic and therapeutic PARACENTESIS MEDICATIONS: 6 cc 1% lidocaine COMPLICATIONS: None immediate. PROCEDURE: Informed written consent was obtained from the patient after a discussion of the risks, benefits and alternatives to treatment. A timeout was performed  prior to the initiation of the procedure. Initial ultrasound scanning demonstrates a large amount of ascites within the right lower abdominal quadrant. The right lower abdomen was prepped and draped in the usual sterile fashion. 1% lidocaine was used for local anesthesia. Following this, a 19 gauge, 7-cm, Yueh catheter was introduced. An ultrasound image was saved for documentation purposes. The paracentesis was performed. The catheter was removed and a dressing was applied. The patient tolerated the procedure well without immediate post procedural complication. FINDINGS: A total of approximately 2.9 L of clear yellow fluid was removed. Samples were sent to the laboratory as requested by the clinical team. IMPRESSION: Successful ultrasound-guided paracentesis yielding 2.9 liters of peritoneal fluid. Procedure performed by Mina Marble, PA-C PLAN: If the patient eventually requires >/=2 paracenteses in a 30 day period, candidacy for formal evaluation by the [facility] Interventional Radiology [facility] Hypertension [facility] will be assessed. Roanna Banning, MD [facility] Radiology Electronically Signed   By: Roanna Banning M.D.   On: 10/26/2022 15:31   DG Abd 2 Views  Result Date: 10/25/2022 CLINICAL DATA:  Small-bowel obstruction EXAM: ABDOMEN - 2 VIEW COMPARISON:  CT abdomen and pelvis dated 10/24/2022 FINDINGS: Excreted contrast material within the urinary bladder and likely overlapping within the rectum. Single persistently dilated loop of small bowel in the left upper quadrant. Otherwise, bowel gas pattern is nonobstructive. No acute osseous abnormality. IMPRESSION: Single persistently dilated loop of small bowel in the left upper quadrant, which may represent ileus. Otherwise, bowel gas pattern is nonobstructive. Electronically Signed   By: Agustin Cree M.D.   On: 10/25/2022 08:26   CT ABDOMEN PELVIS W CONTRAST  Result Date: 10/24/2022 CLINICAL DATA:  Acute  generalized abdominal pain. EXAM: CT ABDOMEN AND PELVIS WITH CONTRAST TECHNIQUE: Multidetector CT imaging of the abdomen and pelvis was performed using the standard protocol following bolus administration of intravenous contrast. RADIATION DOSE REDUCTION: This exam was performed according to the departmental dose-optimization program which includes automated exposure control, adjustment of the mA and/or kV according to patient size and/or use of iterative reconstruction technique. CONTRAST:  OMNIPAQUE IOHEXOL 300 MG/ML  SOLN COMPARISON:  February 02, 2013. FINDINGS: Lower chest: No acute abnormality. Hepatobiliary: Status post cholecystectomy. No biliary dilatation is noted. Nodular hepatic margins are noted consistent with hepatic cirrhosis. Pancreas: Unremarkable. No pancreatic ductal dilatation or surrounding inflammatory changes. Spleen: Normal in size without focal abnormality. Adrenals/Urinary Tract: Adrenal glands are unremarkable. Kidneys are normal, without renal calculi, focal lesion, or hydronephrosis. Bladder is unremarkable. Stomach/Bowel: Stomach is unremarkable. The appendix appears normal. There is severe duodenal and proximal jejunal dilatation most likely due to partial obstruction from significant wall thickening and inflammation or edema involving proximal jejunal loops. The colon is unremarkable. Vascular/Lymphatic: Aortic atherosclerosis. No enlarged abdominal or pelvic lymph nodes. Multiple collateral veins are noted throughout the abdomen consistent with  portal venous hypertension. Reproductive: Uterus and bilateral adnexa are unremarkable. Other: Mild ascites is noted. Small fat containing periumbilical hernia is noted. Musculoskeletal: No acute or significant osseous findings. IMPRESSION: Hepatic cirrhosis with ascites and portal venous hypertension. Moderate wall thickening is seen involving jejunal bowel loops most likely due to inflammation or edema, and this appears to result in at  least partial obstruction of the more proximal jejunal and duodenal small bowel. Aortic Atherosclerosis (ICD10-I70.0). Electronically Signed   By: Lupita Raider M.D.   On: 10/24/2022 11:57   CT Head Wo Contrast  Result Date: 10/24/2022 CLINICAL DATA:  Altered mental status EXAM: CT HEAD WITHOUT CONTRAST TECHNIQUE: Contiguous axial images were obtained from the base of the skull through the vertex without intravenous contrast. RADIATION DOSE REDUCTION: This exam was performed according to the departmental dose-optimization program which includes automated exposure control, adjustment of the mA and/or kV according to patient size and/or use of iterative reconstruction technique. COMPARISON:  10/14/2009 FINDINGS: Brain: No evidence of acute infarction, hemorrhage, mass, mass effect, or midline shift. No hydrocephalus or extra-axial fluid collection. Partial empty sella. Normal craniocervical junction. Vascular: No hyperdense vessel. Skull: Negative for fracture or focal lesion. Sinuses/Orbits: No acute finding. Other: The mastoid air cells are well aerated. IMPRESSION: No acute intracranial process. Electronically Signed   By: Wiliam Ke M.D.   On: 10/24/2022 11:50   DG Chest Port 1 View  Result Date: 10/24/2022 CLINICAL DATA:  Altered mental status.  Shortness of breath EXAM: PORTABLE CHEST 1 VIEW COMPARISON:  01/25/2020 FINDINGS: Underinflation. Film is rotated to the left and is slightly kyphotic. No pneumothorax or effusion. Normal cardiopericardial silhouette. Stable interstitial changes and bronchovascular crowding. Overlapping cardiac leads. Degenerative changes of the spine IMPRESSION: Stable interstitial changes.  Underinflation. Electronically Signed   By: Karen Kays M.D.   On: 10/24/2022 10:25    Microbiology: Results for orders placed or performed during the hospital encounter of 10/24/22  MRSA Next Gen by PCR, Nasal     Status: None   Collection Time: 10/24/22  5:22 PM   Specimen:  Nasal Mucosa; Nasal Swab  Result Value Ref Range Status   MRSA by PCR Next Gen NOT DETECTED NOT DETECTED Final    Comment: (NOTE) The GeneXpert MRSA Assay (FDA approved for NASAL specimens only), is one component of a comprehensive MRSA colonization surveillance program. It is not intended to diagnose MRSA infection nor to guide or monitor treatment for MRSA infections. Test performance is not FDA approved in patients less than 92 years old. Performed at [facility], [address], [place], [place] [address]   Body fluid culture w Gram Stain     Status: None (Preliminary result)   Collection Time: 10/26/22  3:33 PM   Specimen: PATH Cytology Peritoneal fluid  Result Value Ref Range Status   Specimen Description   Final    PERITONEAL Performed at [facility], [address], [place], [place] [address]    Special Requests   Final    OTHER CELLS UNIDENTIFIED; SEE CYTOLOGY REPORT Performed at [facility], [address] [place], [place] [address]    Gram Stain NO WBC SEEN NO ORGANISMS SEEN   Final   Culture   Final    NO GROWTH < 24 HOURS Performed at [facility], [address], [place], [place] [address]    Report Status PENDING  Incomplete  Anaerobic culture w Gram Stain     Status: None (Preliminary result)  Collection Time: 10/26/22  3:33 PM   Specimen: Peritoneal Washings  Result Value Ref Range Status   Specimen Description   Final    PERITONEAL Performed at [facility], [address] [place], [place] [address]    Special Requests   Final    NONE Performed at [facility], [address] [place], [place] [address]    Gram Stain   Final    NO WBC SEEN NO ORGANISMS SEEN Performed at [facility], [address], [place], [place] [address]    Culture PENDING  Incomplete   Report Status PENDING  Incomplete    Labs: CBC: Recent Labs  Lab 10/24/22 1013 10/25/22 0419 10/27/22 0659  WBC 7.5 4.9  5.2  NEUTROABS 5.5  --   --   HGB 14.0 13.1 12.7  HCT 44.1 41.1 39.5  MCV 89.8 89.7 89.4  PLT 172 155 149*   Basic Metabolic Panel: Recent Labs  Lab 10/24/22 1013 10/25/22 0419 10/26/22 0347 10/27/22 0659  NA 130* 135 134* 132*  K 3.6 3.2* 4.1 3.9  CL 94* 102 103 104  CO2 27 28 22 24   GLUCOSE 128* 81 83 103*  BUN 15 12 8 9   CREATININE 0.94 0.63 0.60 0.76  CALCIUM 9.0 8.7* 8.2* 8.2*  MG  --  1.9  --   --   PHOS  --  4.0  --   --    Liver Function Tests: Recent Labs  Lab 10/24/22 1013 10/25/22 0419 10/27/22 0659  AST 30 23 28   ALT 16 13 14   ALKPHOS 81 64 60  BILITOT 1.8* 1.2 0.7  PROT 8.0 6.6 5.9*  ALBUMIN 3.3* 2.7* 2.5*   CBG: Recent Labs  Lab 10/26/22 1550 10/26/22 1917 10/26/22 2329 10/27/22 0345 10/27/22 0737  GLUCAP 82 96 99 121* 110*    Discharge time spent: greater than 30 minutes.  Signed: Marcelino Duster, MD [facility] 10/27/2022

## 2022-10-27 NOTE — Progress Notes (Signed)
CSW spoke with patient and discussed the insurance barriers with HHPT. CSW asked patient if CSW is unable to find a home health agency is she willing to accept outpatient PT. Patient stated no because she will not have any transportation to get there. Patient stated she doesn't know her daughters address but will call CSW when her daughter arrives to the hospital and provide the address so DME equipment can be delivered.

## 2022-10-27 NOTE — Evaluation (Signed)
Occupational Therapy Evaluation Patient Details Name: [name] MRN: [id number] DOB: 13-Jun-1965 Today's Date: 10/27/2022   History of Present Illness Pt is a 57 year old female admitted with acute metabolic encephalopathy, suspected partial SBO; s/p  image-guided paracentesis from the right lower abdomen.  Yielded 2.9 liters of clear yellow fluid 6/29; PMH significant for hepatitis C with subsequent cirrhosis, diastolic heart failure,  COPD, ILD (2 L oxygen), HTN, GERD   Clinical Impression   Chart reviewed, pt greeted in room agreeable to OT evaluation. Pt is alert and oriented x4, some ?problem solving deficits noted, will continue to assess however pt reports plans to discharge on this date. Safe completion of ADL in room on this date. PTA pt is generally MOD I in ADL/IADL per her report, per chart review in care everywhere pt with hospitalization early 2024, potentially did require some assist for mobility/ADL/IADL at that time. Pt performed ADL tasks with supervision, increased time. Pt presents with deficits in strength, endurance, activity tolerance affecting safe and optimal ADL completion. OT will continue to follow acutely.      Recommendations for follow up therapy are one component of a multi-disciplinary discharge planning process, led by the attending physician.  Recommendations may be updated based on patient status, additional functional criteria and insurance authorization.   Assistance Recommended at Discharge Intermittent Supervision/Assistance  Patient can return home with the following Assistance with cooking/housework    Functional Status Assessment  Patient has had a recent decline in their functional status and demonstrates the ability to make significant improvements in function in a reasonable and predictable amount of time.  Equipment Recommendations  BSC/3in1    Recommendations for Other Services       Precautions / Restrictions Precautions Precautions:  Fall Restrictions Weight Bearing Restrictions: No      Mobility Bed Mobility Overal bed mobility: Modified Independent                  Transfers Overall transfer level: Needs assistance Equipment used: None Transfers: Sit to/from Stand Sit to Stand: Supervision                  Balance Overall balance assessment: Needs assistance Sitting-balance support: Feet supported Sitting balance-Leahy Scale: Good     Standing balance support: During functional activity, No upper extremity supported Standing balance-Leahy Scale: Fair                             ADL either performed or assessed with clinical judgement   ADL Overall ADL's : Needs assistance/impaired Eating/Feeding: Set up;Sitting   Grooming: Wash/dry hands;Standing;Modified independent           Upper Body Dressing : Set up   Lower Body Dressing: Minimal assistance   Toilet Transfer: Supervision/safety;Ambulation           Functional mobility during ADLs: Supervision/safety (household distances in room, pt is mildly unsteady)       Vision Patient Visual Report: No change from baseline       Perception     Praxis      Pertinent Vitals/Pain Pain Assessment Pain Assessment: No/denies pain     Hand Dominance     Extremity/Trunk Assessment Upper Extremity Assessment Upper Extremity Assessment: Overall WFL for tasks assessed   Lower Extremity Assessment Lower Extremity Assessment: Overall WFL for tasks assessed       Communication Communication Communication: No difficulties   Cognition Arousal/Alertness: Awake/alert Behavior During  Therapy: WFL for tasks assessed/performed Overall Cognitive Status: Within Functional Limits for tasks assessed Area of Impairment: Problem solving                             Problem Solving: Requires verbal cues       General Comments  vss throughout    Exercises Other Exercises Other Exercises: edu pt re:  role of OT, role of rehab, safe ADL completion at home   Shoulder Instructions      Home Living Family/patient expects to be discharged to:: Private residence Living Arrangements: Children (lives with 8+ people, plans to discharge home to daugthers house) Available Help at Discharge: Family;Available PRN/intermittently Type of Home: House Home Access: Stairs to enter [employer] of Steps: 3 Entrance Stairs-Rails: None Home Layout: One level     Bathroom Shower/Tub: Tub/shower unit         Home Equipment: None   Additional Comments: pt is planning to d/c to her daughter's home, info above is for daughter's house      Prior Functioning/Environment Prior Level of Function : Independent/Modified Independent             Mobility Comments: pt reports MOD I-I in ADL/IADL ADLs Comments: pt reports generally MOD I with ADL, assist for IADL as needed        OT Problem List: Decreased activity tolerance;Decreased knowledge of use of DME or AE      OT Treatment/Interventions: DME and/or AE instruction;Therapeutic activities;Self-care/ADL training;Balance training;Therapeutic exercise;Energy conservation    OT Goals(Current goals can be found in the care plan section) Acute Rehab OT Goals Patient Stated Goal: go home OT Goal Formulation: With patient Time For Goal Achievement: 11/10/22 Potential to Achieve Goals: Good ADL Goals Pt Will Perform Grooming: with modified independence;standing Pt Will Perform Lower Body Dressing: with modified independence;sit to/from stand  OT Frequency: Min 2X/week    Co-evaluation              AM-PAC OT "6 Clicks" Daily Activity     Outcome Measure Help from another person eating meals?: None Help from another person taking care of personal grooming?: None Help from another person toileting, which includes using toliet, bedpan, or urinal?: None Help from another person bathing (including washing, rinsing, drying)?: A  Little Help from another person to put on and taking off regular upper body clothing?: None Help from another person to put on and taking off regular lower body clothing?: A Little 6 Click Score: 22   End of Session Nurse Communication: Mobility status  Activity Tolerance: Patient tolerated treatment well Patient left: Other (comment) (at edge of bed)  OT Visit Diagnosis: Unsteadiness on feet (R26.81)                Time: 9147-8295 OT Time Calculation (min): 9 min Charges:  OT General Charges $OT Visit: 1 Visit OT Evaluation $OT Eval Low Complexity: 1 Low  Oleta Mouse, OTD OTR/L  10/27/22, 3:05 PM

## 2022-10-28 LAB — ANAEROBIC CULTURE W GRAM STAIN

## 2022-10-28 LAB — BODY FLUID CULTURE W GRAM STAIN

## 2022-10-28 LAB — PATHOLOGIST SMEAR REVIEW

## 2022-10-29 LAB — BODY FLUID CULTURE W GRAM STAIN: Culture: NO GROWTH

## 2022-10-31 LAB — BODY FLUID CULTURE W GRAM STAIN

## 2022-10-31 LAB — ANAEROBIC CULTURE W GRAM STAIN: Gram Stain: NONE SEEN

## 2022-11-07 ENCOUNTER — Telehealth: Payer: Self-pay | Admitting: Family

## 2022-11-07 ENCOUNTER — Encounter: Payer: Medicaid Other | Admitting: Family

## 2022-11-07 NOTE — Telephone Encounter (Signed)
Patient did not show for her [facility] appointment on 11/07/22.  

## 2022-11-07 NOTE — Progress Notes (Deleted)
PCP: [facility] (last seen 03/24) Primary Cardiologist: none  HPI:  [name] is a 57 y/o female with a history of asthma, DM, hyperlipidemia, HTN, sleep apnea, ILD, hepatic cirrhosis, COPD, PTSD, current tobacco use and chronic heart failure.   Admitted 10/30/22 due to abdominal pain, nausea, vomiting and found to have ileus. Thought to be d/t ileal and jejunal wall thickening seen on CT a/p. GI recommended push enteroscopy, though patient requested discharge prior to this and she was discharged before medically advised due to needing to attend a home inspection. Patient reports she will return to the hospital in the next 48-72 hours for further treatment and evaluation.   Admitted 10/24/22 due to becoming short of breath, confused and not responding as she would normally. She has been nauseated. Last paracentesis was during her admission at [facility]. She smokes cigarettes but stopped drinking alcohol a few months ago. 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 Patient is started on lactulose therapy, surgery consulted for suspected partial small bowel obstruction who did not recommend surgical intervention. Had ultrasound-guided paracentesis with 2.9 L fluid removed. Given her hypotension patient is started on midodrine therapy.   Admitted 10/10/22 due to N/V and volume overload and subsequently developed small bowel obstruction. Evaluated by the surgical team while at main campus and no surgical intervention was performed. An NG tube was placed during admission to suction and patient remained n.p.o. Meds adjusted.   Echo 05/20/22: EF 60-65% with mild right ventricle dilation Echo 12/17/21: EF of >55%.  Echo 09/13/20: EF of 55-60% with mild LAE.  Echo 01/22/20: EF of 60-65%.                               She presents today for a HF follow-up visit with a chief complaint of      ROS: All systems negative except as listed in HPI, PMH and  Problem List.  SH:  Social History   Socioeconomic History   Marital status: Married    Spouse name: Not on file   Number of children: Not on file   Years of education: Not on file   Highest education level: Not on file  Occupational History   Not on file  Tobacco Use   Smoking status: Every Day    Current packs/day: 0.25    Average packs/day: 0.3 packs/day for 36.0 years (9.0 ttl pk-yrs)    Types: Cigarettes   Smokeless tobacco: Never  Substance and Sexual Activity   Alcohol use: Yes    Alcohol/week: 0.0 standard drinks of alcohol    Comment: 2-3 drinks/wk. Prev heavy drinker.   Drug use: Not Currently   Sexual activity: Not on file  Other Topics Concern   Not on file  Social History Narrative   Lives locally w/ her husband. Sedentary in setting of chronic DOE and O2 dependence.    Social Determinants of Health   Financial Resource Strain: Low Risk  (05/11/2022)   Received from [facility], [facility]   Overall Financial Resource Strain (CARDIA)    Difficulty of Paying Living Expenses: Not very hard  Food Insecurity: No Food Insecurity (05/11/2022)   Received from [facility], [facility]   Hunger Vital Sign    Worried About Running Out of Food in the Last Year: Never true    Ran Out of Food in the Last Year: Never true  Transportation Needs: No Transportation Needs (05/11/2022)   Received from [facility], [facility]   [facility] - Transportation    Lack of Transportation (Medical): No    Lack of Transportation (Non-Medical): No  Physical Activity: Not on file  Stress: Not on file  Social Connections: Not on file  Intimate Partner Violence: Not At Risk (01/11/2022)   Received from [facility], [facility]   Humiliation, Afraid, Rape, and Kick questionnaire    Fear of Current or Ex-Partner: No    Emotionally Abused: No    Physically Abused: No    Sexually Abused: No    FH:  Family History  Problem Relation Age of Onset   Heart  failure Mother     Past Medical History:  Diagnosis Date   Asthma    CHF (congestive heart failure) (HCC)    COPD (chronic obstructive pulmonary disease) (HCC)    Diabetes mellitus without complication (HCC)    Diastolic dysfunction    a. 03/2018 Echo [facility]): EF >55%, Gr1 DD. Nl RV fxn.   Esophageal varices (HCC)    a. 10/2019 EGD: gr III esoph varices s/p banding.   Essential hypertension    GI bleed    HCV (hepatitis C virus)    Hematemesis    a. 10/2019 EGD: Gr III esoph varices, incompletely eradicated, banded. Non-bleeding ulcers tx w/ argon plasma coag. Single non-bleeding angiodysplastic lesion in duodenum tx w/ APC.   Hepatic cirrhosis (HCC)    a. h/o ETOH/Hep C.   Hypertension    Interstitial lung disease (HCC)    a. Followed @ [facility] pulm. Prev on cellcept (caused GI side effects), Imuran (GI se), myfortic (elev LFTs). More recently on rituximab.   Mixed hyperlipidemia    Morbid obesity (HCC)    O2 dependent    PTSD (post-traumatic stress disorder)    Sleep apnea    Tobacco abuse     Current Outpatient Medications  Medication Sig Dispense Refill   albuterol (PROVENTIL HFA;VENTOLIN HFA) 108 (90 Base) MCG/ACT inhaler Inhale 1-2 puffs into the lungs every 6 (six) hours as needed for wheezing or shortness of breath.     albuterol (PROVENTIL) (2.5 MG/3ML) 0.083% nebulizer solution Take 2.5 mg by nebulization every 4 (four) hours as needed.     atorvastatin (LIPITOR) 10 MG tablet Take 10 mg by mouth daily.      budesonide-formoterol (SYMBICORT) 160-4.5 MCG/ACT inhaler Inhale 2 puffs into the lungs daily. (Patient not taking: Reported on 01/31/2022)     fluticasone (FLONASE) 50 MCG/ACT nasal spray Place 1 spray into both nostrils daily.     furosemide (LASIX) 20 MG tablet Take 40 mg by mouth daily.     hydrOXYzine (ATARAX) 10 MG tablet Take 1 tablet by mouth 3 (three) times daily as needed.     lactulose (CHRONULAC) 10 GM/15ML solution Take 30 mLs (20 g total) by mouth 2 (two)  times daily. 236 mL 0   midodrine (PROAMATINE) 5 MG tablet Take 1 tablet (5 mg total) by mouth 3 (three) times daily with meals. 30 tablet 2   montelukast (SINGULAIR) 10 MG tablet Take 10 mg by mouth at bedtime.      naloxone (NARCAN) 4 MG/0.1ML LIQD nasal spray kit      nicotine polacrilex (NICORETTE) 2 MG gum Take 1 each (2 mg total) by mouth as needed for smoking cessation. 100 tablet 0   pantoprazole (PROTONIX) 40 MG tablet Take 1 tablet (40 mg total) by mouth 2 (  two) times daily. (Patient taking differently: Take 40 mg by mouth daily.) 30 tablet 1   pregabalin (LYRICA) 25 MG capsule Take 25 mg by mouth 3 (three) times daily.     SPIRIVA RESPIMAT 2.5 MCG/ACT AERS Inhale 2 puffs into the lungs daily as needed. (Patient not taking: Reported on 01/31/2022)     spironolactone (ALDACTONE) 25 MG tablet Take 4 tablets (100 mg total) by mouth daily. 60 tablet 2   triamcinolone ointment (KENALOG) 0.5 % Apply 1 application topically 2 (two) times daily. (Patient not taking: Reported on 10/24/2022)     venlafaxine XR (EFFEXOR-XR) 75 MG 24 hr capsule Take 150 mg by mouth daily.     No current facility-administered medications for this visit.      PHYSICAL EXAM:  General:  Well appearing. No resp difficulty HEENT: normal Neck: supple. JVP flat. Carotids 2+ bilaterally; no bruits. No lymphadenopathy or thryomegaly appreciated. Cor: PMI normal. Regular rate & rhythm. No rubs, gallops or murmurs. Lungs: clear Abdomen: soft, nontender, nondistended. No hepatosplenomegaly. No bruits or masses. Good bowel sounds. Extremities: no cyanosis, clubbing, rash, edema Neuro: alert & orientedx3, cranial nerves grossly intact. Moves all 4 extremities w/o difficulty. Affect pleasant.   ECG:   ASSESSMENT & PLAN:  1: Chronic heart failure with preseved ejection fraction without structural changes- - suspect due to  - NYHA class III - euvolemic - weighing daily; reminded to call for an overnight weight gain  of > 2 pounds or a weekly weight gain of > 5 pounds - weight down 7 pounds from last visit here 2 months ago - Echo 05/20/22: EF 60-65% with mild right ventricle dilation - Echo 12/17/21: EF of >55%.  - Echo 09/13/20: EF of 55-60% with mild LAE.  - Echo 01/22/20: EF of 60-65%. - says that she's not adding salt  - continue  - BNP 10/10/22 was 57.63  2: HTN- - BP -  - saw PCP @ [facility] 03/24 - BMP 11/03/22 reviewed and showed sodium 136, potassium 3.4, creatinine 0.74 and GFR >90 - BMP today  3: DM- - A1c 05/20/22 was 6.8% - no longer on insulin nor checking her glucose at home  4: ILD- - saw pulmonology ([facility]) 06/23 - receives rituximab infusion every 4 months  - wearing CPAP at bedtime along with oxygen at 2L  5: Tobacco use- - smoking 4 cigarettes daily - she says she's been told that she can not have her hernia surgery until she stops smoking - admits to being under a lot of stress; her husband is 16 years older than her and she feels like he's developing some type of dementia - not using chantix - tobacco cessation discussed for 3 minutes with her

## 2023-01-09 ENCOUNTER — Ambulatory Visit: Payer: Medicaid Other | Admitting: Podiatry

## 2023-01-09 ENCOUNTER — Ambulatory Visit (INDEPENDENT_AMBULATORY_CARE_PROVIDER_SITE_OTHER): Payer: Medicaid Other | Admitting: Podiatry

## 2023-01-09 DIAGNOSIS — M79675 Pain in left toe(s): Secondary | ICD-10-CM | POA: Diagnosis not present

## 2023-01-09 DIAGNOSIS — B351 Tinea unguium: Secondary | ICD-10-CM

## 2023-01-09 DIAGNOSIS — M79674 Pain in right toe(s): Secondary | ICD-10-CM

## 2023-01-09 MED ORDER — CICLOPIROX 8 % EX SOLN: Freq: Every day | CUTANEOUS | 0 refills | Status: AC

## 2023-01-09 NOTE — Progress Notes (Signed)
Complaint:  Visit Type: Patient returns to my office for continued preventative foot care services. Complaint: Patient states" my nails have grown long and thick and become painful to walk and wear shoes" Patient has been diagnosed with DM with no foot complications. The patient presents for preventative foot care services. No changes to ROS  Podiatric Exam: Vascular: dorsalis pedis and posterior tibial pulses are palpable bilateral. Capillary return is immediate. Temperature gradient is WNL. Skin turgor WNL  Sensorium: Normal Semmes Weinstein monofilament test. Normal tactile sensation bilaterally. Nail Exam: Pt has thick disfigured discolored nails with subungual debris noted bilateral entire nail hallux through fifth toenails Ulcer Exam: There is no evidence of ulcer or pre-ulcerative changes or infection. Orthopedic Exam: Muscle tone and strength are WNL. No limitations in general ROM. No crepitus or effusions noted. Foot type and digits show no abnormalities. Bony prominences are unremarkable. Skin: No Porokeratosis. No infection or ulcers  Diagnosis:  Onychomycosis, , Pain in right toe, pain in left toes  Treatment & Plan Procedures and Treatment: Consent by patient was obtained for treatment procedures.   Debridement of mycotic and hypertrophic toenails, 1 through 5 bilateral and clearing of subungual debris. No ulceration, no infection noted.  Return Visit-Office Procedure: Patient instructed to return to the office for a follow up visit 3 months for continued evaluation and treatment.  Nicholes Rough D.P.M.

## 2023-04-10 ENCOUNTER — Ambulatory Visit (INDEPENDENT_AMBULATORY_CARE_PROVIDER_SITE_OTHER): Payer: Medicaid Other | Admitting: Podiatry

## 2023-04-10 DIAGNOSIS — Z91199 Patient's noncompliance with other medical treatment and regimen due to unspecified reason: Secondary | ICD-10-CM

## 2023-04-10 NOTE — Progress Notes (Signed)
1. No-show for appointment     

## 2023-08-11 ENCOUNTER — Ambulatory Visit (INDEPENDENT_AMBULATORY_CARE_PROVIDER_SITE_OTHER): Admitting: Podiatry

## 2023-08-11 DIAGNOSIS — Z91199 Patient's noncompliance with other medical treatment and regimen due to unspecified reason: Secondary | ICD-10-CM

## 2023-08-11 NOTE — Progress Notes (Signed)
 1. No-show for appointment    No show #2.

## 2023-10-09 ENCOUNTER — Encounter: Payer: Self-pay | Admitting: Podiatry

## 2023-10-09 ENCOUNTER — Ambulatory Visit: Admitting: Podiatry

## 2023-10-09 DIAGNOSIS — E119 Type 2 diabetes mellitus without complications: Secondary | ICD-10-CM

## 2023-10-09 DIAGNOSIS — M79675 Pain in left toe(s): Secondary | ICD-10-CM

## 2023-10-09 DIAGNOSIS — B351 Tinea unguium: Secondary | ICD-10-CM

## 2023-10-09 DIAGNOSIS — M79674 Pain in right toe(s): Secondary | ICD-10-CM

## 2023-10-16 NOTE — Progress Notes (Signed)
 ANNUAL DIABETIC FOOT EXAM  Subjective: [name] presents today for annual diabetic foot exam.  Patient has been diagnosed with neuropathy.  Loletha Ripper, MD is patient's PCP. LOV 10/05/2023.  Past Medical History:  Diagnosis Date   Asthma    CHF (congestive heart failure) (HCC)    COPD (chronic obstructive pulmonary disease) (HCC)    Diabetes mellitus without complication (HCC)    Diastolic dysfunction    a. 03/2018 Echo [facility]): EF >55%, Gr1 DD. Nl RV fxn.   Esophageal varices (HCC)    a. 10/2019 EGD: gr III esoph varices s/p banding.   Essential hypertension    GI bleed    HCV (hepatitis C virus)    Hematemesis    a. 10/2019 EGD: Gr III esoph varices, incompletely eradicated, banded. Non-bleeding ulcers tx w/ argon plasma coag. Single non-bleeding angiodysplastic lesion in duodenum tx w/ APC.   Hepatic cirrhosis (HCC)    a. h/o ETOH/Hep C.   Hypertension    Interstitial lung disease (HCC)    a. Followed @ [facility] pulm. Prev on cellcept (caused GI side effects), Imuran (GI se), myfortic (elev LFTs). More recently on rituximab.   Mixed hyperlipidemia    Morbid obesity (HCC)    O2 dependent    PTSD (post-traumatic stress disorder)    Sleep apnea    Tobacco abuse    Patient Active Problem List   Diagnosis Date Noted   Acute metabolic encephalopathy 10/24/2022   Hepatic encephalopathy (HCC) 10/24/2022   Small bowel obstruction (HCC) 10/24/2022   Chronic back pain 10/24/2022   GERD (gastroesophageal reflux disease) 10/24/2022   Hypoxia    AKI (acute kidney injury) (HCC)    Acute on chronic diastolic CHF (congestive heart failure) (HCC) 01/22/2020   Obstructive sleep apnea 01/22/2020   Hypokalemia 01/22/2020   COPD (chronic obstructive pulmonary disease) (HCC) 01/22/2020   Dyslipidemia 01/22/2020   Depression with anxiety 01/22/2020   Congestive heart failure (HCC) 01/21/2020   Hematemesis 11/04/2019   Acute upper GI bleed 11/04/2019   UGIB (upper  gastrointestinal bleed) 11/04/2019   Diabetes mellitus without complication (HCC)    Essential hypertension    Hepatitis C, chronic (HCC)    Alcohol dependence (HCC)    Melena    Acute gastric ulcer with hemorrhage    Dyspnea on exertion 10/31/2017   Encounter for monitoring immunomodulating therapy 06/20/2017   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   Encephalopathy pulmonary (HCC) 01/02/2017   ILD (interstitial lung disease) (HCC) 01/02/2017   Decompensated HCV cirrhosis (HCC) 01/02/2017   Acute on chronic respiratory failure with hypercapnia (HCC) 12/27/2016   Long term prescription benzodiazepine use 01/24/2016   Neuritis of left sural nerve 01/24/2016   Traumatic leg injury 01/24/2016   Tobacco use disorder 08/10/2015   Tibial plateau fracture 07/02/2015   Gallstones 04/14/2013   Pancreatitis 04/14/2013   Obesity, Class III, BMI 40-49.9 (morbid obesity) 03/05/2013   Past Surgical History:  Procedure Laterality Date   CHOLECYSTECTOMY     ESOPHAGOGASTRODUODENOSCOPY (EGD) WITH PROPOFOL  N/A 11/04/2019   Procedure: ESOPHAGOGASTRODUODENOSCOPY (EGD) WITH PROPOFOL ;  Surgeon: Marnee Sink, MD;  Location: [facility] ENDOSCOPY;  Service: Endoscopy;  Laterality: N/A;   ORIF TIBIA PLATEAU Left 07/02/2015   Procedure: OPEN REDUCTION INTERNAL FIXATION (ORIF) TIBIAL PLATEAU;  Surgeon: Rande Bushy, MD;  Location: [facility] ORS;  Service: Orthopedics;  Laterality: Left;   Current Outpatient Medications on File Prior to Visit  Medication Sig Dispense Refill   albuterol  (PROVENTIL  HFA;VENTOLIN  HFA) 108 (90 Base)  MCG/ACT inhaler Inhale 1-2 puffs into the lungs every 6 (six) hours as needed for wheezing or shortness of breath.     albuterol  (PROVENTIL ) (2.5 MG/3ML) 0.083% nebulizer solution Take 2.5 mg by nebulization every 4 (four) hours as needed.     atorvastatin  (LIPITOR) 10 MG tablet Take 10 mg by mouth daily.      budesonide -formoterol  (SYMBICORT) 160-4.5 MCG/ACT inhaler Inhale 2  puffs into the lungs daily. (Patient not taking: Reported on 01/31/2022)     ciclopirox  (PENLAC ) 8 % solution Apply topically at bedtime. Apply over nail and surrounding skin. Apply daily over previous coat. After seven (7) days, may remove with alcohol and continue cycle. 6.6 mL 0   fluticasone  (FLONASE ) 50 MCG/ACT nasal spray Place 1 spray into both nostrils daily.     furosemide  (LASIX ) 20 MG tablet Take 40 mg by mouth daily.     hydrOXYzine  (ATARAX ) 10 MG tablet Take 1 tablet by mouth 3 (three) times daily as needed.     lactulose  (CHRONULAC ) 10 GM/15ML solution Take 30 mLs (20 g total) by mouth 2 (two) times daily. 236 mL 0   midodrine  (PROAMATINE ) 5 MG tablet Take 1 tablet (5 mg total) by mouth 3 (three) times daily with meals. 30 tablet 2   montelukast  (SINGULAIR ) 10 MG tablet Take 10 mg by mouth at bedtime.      naloxone  (NARCAN ) 4 MG/0.1ML LIQD nasal spray kit      nicotine  polacrilex (NICORETTE ) 2 MG gum Take 1 each (2 mg total) by mouth as needed for smoking cessation. 100 tablet 0   pantoprazole  (PROTONIX ) 40 MG tablet Take 1 tablet (40 mg total) by mouth 2 (two) times daily. (Patient taking differently: Take 40 mg by mouth daily.) 30 tablet 1   pregabalin (LYRICA) 25 MG capsule Take 25 mg by mouth 3 (three) times daily.     SPIRIVA  RESPIMAT 2.5 MCG/ACT AERS Inhale 2 puffs into the lungs daily as needed. (Patient not taking: Reported on 01/31/2022)     spironolactone  (ALDACTONE ) 25 MG tablet Take 4 tablets (100 mg total) by mouth daily. 60 tablet 2   triamcinolone  ointment (KENALOG ) 0.5 % Apply 1 application topically 2 (two) times daily. (Patient not taking: Reported on 10/24/2022)     venlafaxine XR (EFFEXOR-XR) 75 MG 24 hr capsule Take 150 mg by mouth daily.     No current facility-administered medications on file prior to visit.    Allergies  Allergen Reactions   Other Other (See Comments)    Gel capsules causes GI problems   Citrated Iron     GI upset   Mycophenolate Sodium  Other (See Comments)    transaminitis   Azathioprine Nausea Only   Mycophenolate Mofetil Nausea Only   Tacrolimus Diarrhea and Nausea Only   Social History   Occupational History   Not on file  Tobacco Use   Smoking status: Every Day    Current packs/day: 0.25    Average packs/day: 0.3 packs/day for 36.0 years (9.0 ttl pk-yrs)    Types: Cigarettes   Smokeless tobacco: Never  Substance and Sexual Activity   Alcohol use: Yes    Alcohol/week: 0.0 standard drinks of alcohol    Comment: 2-3 drinks/wk. Prev heavy drinker.   Drug use: Not Currently   Sexual activity: Not on file   Family History  Problem Relation Age of Onset   Heart failure Mother    Immunization History  Administered Date(s) Administered   Hepatitis A, Adult 11/16/2008, 05/17/2009  Influenza,inj,Quad PF,6+ Mos 01/25/2015, 01/20/2017, 03/13/2018, 01/22/2019, 01/26/2020   Influenza-Unspecified 01/28/2016   Moderna Sars-Covid-2 Vaccination 11/15/2019, 02/09/2020   Pfizer(Comirnaty)Fall Seasonal Vaccine 12 years and older 04/11/2023   Pneumococcal Conjugate-13 04/25/2016   Pneumococcal Polysaccharide-23 01/20/2017     Review of Systems: Negative except as noted in the HPI.   Objective: There were no vitals filed for this visit.  [name] is a pleasant 58 y.o. female in NAD. AAO X 3.  Diabetic foot exam was performed with the following findings:   Vascular Examination: Capillary refill time immediate b/l. Palpable pedal pulses. Pedal hair present b/l. No pain with calf compression b/l. Skin temperature gradient WNL b/l. No cyanosis or clubbing b/l. No ischemia or gangrene noted b/l. No edema noted b/l LE.  Neurological Examination: Sensation grossly intact b/l with 10 gram monofilament. Vibratory sensation intact b/l.   Dermatological Examination: Pedal skin with normal turgor, texture and tone b/l.  No open wounds. No interdigital macerations.   Toenails 1-5 b/l thick, discolored, elongated  with subungual debris and pain on dorsal palpation.   No hyperkeratotic nor porokeratotic lesions present on today's visit.  Musculoskeletal Examination: Muscle strength 5/5 to all lower extremity muscle groups bilaterally. No pain, crepitus or joint limitation noted with ROM b/l LE. No gross bony pedal deformities b/l. Patient ambulates independently without assistive aids.  Radiographs: None     Lab Results  Component Value Date   HGBA1C 6.9 (H) 01/22/2020   ADA Risk Categorization: Low Risk :  Patient has all of the following: Intact protective sensation No prior foot ulcer  No severe deformity Pedal pulses present  Assessment: 1. Pain due to onychomycosis of toenails of both feet   2. Diabetes mellitus without complication (HCC)   3. Encounter for diabetic foot exam (HCC)     Plan: Diabetic foot examination performed today. All patient's and/or POA's questions/concerns addressed on today's visit. Toenails 1-5 debrided in length and girth without incident. Continue foot and shoe inspections daily. Monitor blood glucose per PCP/Endocrinologist's recommendations. Continue soft, supportive shoe gear daily. Report any pedal injuries to medical professional. Call office if there are any questions/concerns. -Patient/POA to call should there be question/concern in the interim. Return in about 3 months (around 01/09/2024).  Luella Sager, DPM      [place] LOCATION: [address] [place] [address]                   Office [phone]   [facility] LOCATION: [address] [place], [place] [address] Office [phone]

## 2024-01-19 ENCOUNTER — Ambulatory Visit (INDEPENDENT_AMBULATORY_CARE_PROVIDER_SITE_OTHER): Admitting: Podiatry

## 2024-01-19 DIAGNOSIS — Z91199 Patient's noncompliance with other medical treatment and regimen due to unspecified reason: Secondary | ICD-10-CM

## 2024-01-20 NOTE — Progress Notes (Signed)
 1. No-show for appointment

## 2024-03-08 ENCOUNTER — Encounter: Payer: Self-pay | Admitting: Podiatry

## 2024-03-08 ENCOUNTER — Ambulatory Visit (INDEPENDENT_AMBULATORY_CARE_PROVIDER_SITE_OTHER): Admitting: Podiatry

## 2024-03-08 DIAGNOSIS — M79675 Pain in left toe(s): Secondary | ICD-10-CM

## 2024-03-08 DIAGNOSIS — M79674 Pain in right toe(s): Secondary | ICD-10-CM | POA: Diagnosis not present

## 2024-03-08 DIAGNOSIS — B351 Tinea unguium: Secondary | ICD-10-CM | POA: Diagnosis not present

## 2024-03-08 DIAGNOSIS — E119 Type 2 diabetes mellitus without complications: Secondary | ICD-10-CM

## 2024-03-15 NOTE — Progress Notes (Signed)
  Subjective:  Patient ID: [name], female    DOB: May 05, 1965,  MRN: [id number]  [name] presents to clinic today for preventative diabetic foot care for painful elongated mycotic toenails 1-5 bilaterally which are tender when wearing enclosed shoe gear. Pain is relieved with periodic professional debridement.  Chief Complaint  Patient presents with   Toe Pain    Dr. Louetta is her PCP. She say him x 6 month ago. Denies being diabetic   New problem(s): None.   PCP is Rudloff, Ozell ORN, MD.  Allergies  Allergen Reactions   Other Other (See Comments)    Gel capsules causes GI problems   Citrated Iron     GI upset   Mycophenolate Sodium Other (See Comments)    transaminitis   Azathioprine Nausea Only   Mycophenolate Mofetil Nausea Only   Tacrolimus Diarrhea and Nausea Only    Review of Systems: Negative except as noted in the HPI.  Objective: No changes noted in today's physical examination. There were no vitals filed for this visit. [name] is a pleasant 58 y.o. female in NAD. AAO x 3.  Vascular Examination: Capillary refill time immediate b/l. Palpable pedal pulses. Pedal hair present b/l. No pain with calf compression b/l. Skin temperature gradient WNL b/l. No cyanosis or clubbing b/l. No ischemia or gangrene noted b/l. No edema noted b/l LE.  Neurological Examination: Sensation grossly intact b/l with 10 gram monofilament. Vibratory sensation intact b/l.   Dermatological Examination: Pedal skin with normal turgor, texture and tone b/l.  No open wounds. No interdigital macerations.   Toenails 1-5 b/l thick, discolored, elongated with subungual debris and pain on dorsal palpation.   No hyperkeratotic nor porokeratotic lesions present on today's visit.  Musculoskeletal Examination: Muscle strength 5/5 to all lower extremity muscle groups bilaterally. No pain, crepitus or joint limitation noted with ROM b/l LE. No gross bony pedal deformities  b/l. Patient ambulates independently without assistive aids.  Radiographs: None  Assessment/Plan: 1. Pain due to onychomycosis of toenails of both feet   2. Diabetes mellitus without complication [facility])   Consent given for treatment. Patient examined. All patient's and/or POA's questions/concerns addressed on today's visit. Mycotic toenails 1-5 b/l debrided in length and girth without incident. Continue foot and shoe inspections daily. Monitor blood glucose per PCP/Endocrinologist's recommendations.Continue soft, supportive shoe gear daily. Report any pedal injuries to medical professional. Call office if there are any quesitons/concerns. -Patient/POA to call should there be question/concern in the interim.   Return in about 3 months (around 06/08/2024).  [name], DPM      [place] LOCATION: [address] [place] [address]                   Office [phone]   [facility] LOCATION: [address] [place], [place] [address] Office [phone]

## 2024-06-17 ENCOUNTER — Ambulatory Visit: Admitting: Podiatry
